# Patient Record
Sex: Female | Born: 1975 | Race: White | Hispanic: No | Smoking: Current every day smoker
Health system: Southern US, Community
[De-identification: ages and names within clinical notes are randomized; demographics above are authoritative.]

## PROBLEM LIST (undated history)

## (undated) DIAGNOSIS — I82409 Acute embolism and thrombosis of unspecified deep veins of unspecified lower extremity: Secondary | ICD-10-CM

## (undated) DIAGNOSIS — D649 Anemia, unspecified: Secondary | ICD-10-CM

## (undated) DIAGNOSIS — G629 Polyneuropathy, unspecified: Secondary | ICD-10-CM

## (undated) DIAGNOSIS — I10 Essential (primary) hypertension: Secondary | ICD-10-CM

## (undated) DIAGNOSIS — D751 Secondary polycythemia: Secondary | ICD-10-CM

## (undated) DIAGNOSIS — I639 Cerebral infarction, unspecified: Secondary | ICD-10-CM

## (undated) DIAGNOSIS — K219 Gastro-esophageal reflux disease without esophagitis: Secondary | ICD-10-CM

## (undated) HISTORY — PX: NO PAST SURGERIES: SHX2092

## (undated) HISTORY — DX: Gastro-esophageal reflux disease without esophagitis: K21.9

## (undated) HISTORY — DX: Cerebral infarction, unspecified: I63.9

## (undated) HISTORY — DX: Polyneuropathy, unspecified: G62.9

## (undated) HISTORY — DX: Acute embolism and thrombosis of unspecified deep veins of unspecified lower extremity: I82.409

## (undated) HISTORY — DX: Anemia, unspecified: D64.9

## (undated) HISTORY — DX: Secondary polycythemia: D75.1

## (undated) HISTORY — DX: Essential (primary) hypertension: I10

---

## 2003-12-09 ENCOUNTER — Other Ambulatory Visit: Payer: Self-pay

## 2004-11-23 ENCOUNTER — Emergency Department: Payer: Self-pay | Admitting: Emergency Medicine

## 2005-02-01 ENCOUNTER — Emergency Department: Payer: Self-pay | Admitting: Internal Medicine

## 2006-12-06 ENCOUNTER — Emergency Department: Payer: Self-pay

## 2008-07-04 ENCOUNTER — Emergency Department: Payer: Self-pay | Admitting: Emergency Medicine

## 2009-02-06 ENCOUNTER — Emergency Department: Payer: Self-pay | Admitting: Emergency Medicine

## 2009-05-01 ENCOUNTER — Emergency Department: Payer: Self-pay | Admitting: Emergency Medicine

## 2009-08-31 ENCOUNTER — Emergency Department: Payer: Self-pay | Admitting: Emergency Medicine

## 2012-07-09 ENCOUNTER — Emergency Department: Payer: Self-pay | Admitting: Unknown Physician Specialty

## 2014-05-14 ENCOUNTER — Emergency Department: Payer: Self-pay | Admitting: Emergency Medicine

## 2017-10-01 ENCOUNTER — Emergency Department
Admission: EM | Admit: 2017-10-01 | Discharge: 2017-10-01 | Disposition: A | Discharge status: Home / Self Care | Payer: Self-pay | Attending: Emergency Medicine | Admitting: Emergency Medicine

## 2017-10-01 ENCOUNTER — Emergency Department: Payer: Self-pay

## 2017-10-01 ENCOUNTER — Other Ambulatory Visit: Payer: Self-pay

## 2017-10-01 DIAGNOSIS — Y999 Unspecified external cause status: Secondary | ICD-10-CM | POA: Insufficient documentation

## 2017-10-01 DIAGNOSIS — S8262XA Displaced fracture of lateral malleolus of left fibula, initial encounter for closed fracture: Secondary | ICD-10-CM | POA: Insufficient documentation

## 2017-10-01 DIAGNOSIS — Y9382 Activity, spectator at an event: Secondary | ICD-10-CM | POA: Insufficient documentation

## 2017-10-01 DIAGNOSIS — F17200 Nicotine dependence, unspecified, uncomplicated: Secondary | ICD-10-CM | POA: Insufficient documentation

## 2017-10-01 DIAGNOSIS — X509XXA Other and unspecified overexertion or strenuous movements or postures, initial encounter: Secondary | ICD-10-CM | POA: Insufficient documentation

## 2017-10-01 DIAGNOSIS — Y9289 Other specified places as the place of occurrence of the external cause: Secondary | ICD-10-CM | POA: Insufficient documentation

## 2017-10-01 MED ORDER — OXYCODONE-ACETAMINOPHEN 5-325 MG PO TABS
1.0000 | ORAL_TABLET | Freq: Once | ORAL | Status: AC
  Administered 2017-10-01: 1 via ORAL
  Filled 2017-10-01: qty 1

## 2017-10-01 MED ORDER — OXYCODONE-ACETAMINOPHEN 5-325 MG PO TABS: 1.0000 | ORAL_TABLET | Freq: Three times a day (TID) | ORAL | 0 refills | Status: AC | PRN

## 2017-10-01 NOTE — ED Triage Notes (Signed)
Pt states was walking, rolled ankle on drain beside sidewalk. Swelling and bruising noted to L ankle. Has been walking on it. Ice packs at home. Alert, oriented.

## 2017-10-01 NOTE — ED Provider Notes (Signed)
[facility] Emergency Department Provider Note  ____________________________________________  Time seen: Approximately 4:42 PM  I have reviewed the triage vital signs and the nursing notes.   HISTORY  Chief Complaint Ankle Pain    HPI Joanna Garcia is a 42 y.o. female presents to the emergency department with 10 out of 10 aching right ankle pain after patient reports that she rolled her ankle while at a country music concert last night.  Patient reports that she has been able to "barely bear weight" since the incident.  She denies numbness, tingling or changes in sensation of the lower extremities.  Patient did not fall and hit her head.  No alleviating measures have been attempted.   History reviewed. No pertinent past medical history.  There are no active problems to display for this patient.   History reviewed. No pertinent surgical history.  Prior to Admission medications   Medication Sig Start Date End Date Taking? Authorizing Provider  oxyCODONE-acetaminophen (PERCOCET/ROXICET) 5-325 MG tablet Take 1 tablet by mouth every 8 (eight) hours as needed for up to 5 days for severe pain. 10/01/17 10/06/17  Lannie Fields, PA-C    Allergies Patient has no known allergies.  History reviewed. No pertinent family history.  Social History Social History   Tobacco Use  . Smoking status: Current Every Day Smoker  Substance Use Topics  . Alcohol use: Yes  . Drug use: Not on file     Review of Systems  Constitutional: No fever/chills Eyes: No visual changes. No discharge ENT: No upper respiratory complaints. Cardiovascular: no chest pain. Respiratory: no cough. No SOB. Gastrointestinal: No abdominal pain.  No nausea, no vomiting.  No diarrhea.  No constipation. Musculoskeletal: Patient has left ankle pain. Skin: Negative for rash, abrasions, lacerations, ecchymosis. Neurological: Negative for headaches, focal weakness or  numbness.   ____________________________________________   PHYSICAL EXAM:  VITAL SIGNS: ED Triage Vitals  Enc Vitals Group     BP 10/01/17 1527 (!) 168/103     Pulse Rate 10/01/17 1527 (!) 123     Resp 10/01/17 1527 18     Temp 10/01/17 1527 98 F (36.7 C)     Temp Source 10/01/17 1527 Oral     SpO2 10/01/17 1527 97 %     Weight 10/01/17 1527 143 lb (64.9 kg)     Height 10/01/17 1527 5\' 3"  (1.6 m)     Head Circumference --      Peak Flow --      Pain Score 10/01/17 1530 8     Pain Loc --      Pain Edu? --      Excl. in [place]? --      Constitutional: Alert and oriented. Well appearing and in no acute distress. Eyes: Conjunctivae are normal. PERRL. EOMI. Head: Atraumatic.  Cardiovascular: Normal rate, regular rhythm. Normal S1 and S2.  Good peripheral circulation. Respiratory: Normal respiratory effort without tachypnea or retractions. Lungs CTAB. Good air entry to the bases with no decreased or absent breath sounds. Musculoskeletal: Patient performs limited range of motion at the left ankle.  Patient has tenderness elicited over the lateral malleolus.  Patient is able to move all 5 left toes.  Palpable dorsalis pedis pulse, left. Neurologic:  Normal speech and language. No gross focal neurologic deficits are appreciated.  Skin:  Skin is warm, dry and intact. No rash noted. Psychiatric: Mood and affect are normal. Speech and behavior are normal. Patient exhibits appropriate insight and judgement.  ____________________________________________   LABS (all labs ordered are listed, but only abnormal results are displayed)  Labs Reviewed - No data to display ____________________________________________  EKG   ____________________________________________  RADIOLOGY Unk Pinto, personally viewed and evaluated these images (plain radiographs) as part of my medical decision making, as well as reviewing the written report by the radiologist.  Dg Ankle Complete  Left  Result Date: 10/01/2017 CLINICAL DATA:  Pain following rolling injury EXAM: LEFT ANKLE COMPLETE - 3+ VIEW COMPARISON:  None. FINDINGS: Frontal, oblique, and lateral views were obtained. There is generalized soft tissue swelling laterally. There is a transversely oriented fracture of the lateral malleolus with alignment essentially anatomic. No other fracture evident. No joint effusion. No appreciable joint space narrowing or erosion. Ankle mortise appears intact. IMPRESSION: Transverse fracture lateral malleolus with alignment essentially anatomic. Marked soft tissue swelling laterally. Ankle mortise appears intact. No appreciable arthropathy. No joint effusion evident. Electronically Signed   By: Lowella Grip III M.D.   On: 10/01/2017 16:04    ____________________________________________    PROCEDURES  Procedure(s) performed:    Procedures    Medications  oxyCODONE-acetaminophen (PERCOCET/ROXICET) 5-325 MG per tablet 1 tablet (1 tablet Oral Given 10/01/17 1642)     ____________________________________________   INITIAL IMPRESSION / ASSESSMENT AND PLAN / ED COURSE  Pertinent labs & imaging results that were available during my care of the patient were reviewed by me and considered in my medical decision making (see chart for details).  Review of the [place] CSRS was performed in accordance of the [place] prior to dispensing any controlled drugs.     Assessment and plan Lateral malleolus fracture Patient presents to the emergency department with left lateral ankle pain after patient reports that she rolled her ankle at a concert last night.  Differential diagnosis included sprain versus fracture.  X-ray examination reveals a lateral malleolus fracture.  Patient was splinted in the emergency department and given Roxicet for pain.  She was discharged with Percocet.  Patient was referred to orthopedics.  All patient questions were  answered.     ____________________________________________  FINAL CLINICAL IMPRESSION(S) / ED DIAGNOSES  Final diagnoses:  Closed fracture of distal lateral malleolus of left fibula, initial encounter      NEW MEDICATIONS STARTED DURING THIS VISIT:  ED Discharge Orders        Ordered    oxyCODONE-acetaminophen (PERCOCET/ROXICET) 5-325 MG tablet  Every 8 hours PRN     10/01/17 1618          This chart was dictated using voice recognition software/Dragon. Despite best efforts to proofread, errors can occur which can change the meaning. Any change was purely unintentional.    Lannie Fields, PA-C 10/01/17 1835    Nena Polio, MD 10/01/17 [phone]

## 2019-01-31 ENCOUNTER — Ambulatory Visit
Admission: EM | Admit: 2019-01-31 | Discharge: 2019-01-31 | Disposition: A | Discharge status: Other Acute Inpatient Hospital | Payer: Self-pay

## 2019-01-31 ENCOUNTER — Emergency Department: Payer: Self-pay

## 2019-01-31 ENCOUNTER — Emergency Department
Admission: EM | Admit: 2019-01-31 | Discharge: 2019-01-31 | Disposition: A | Discharge status: Home / Self Care | Payer: Self-pay | Attending: Emergency Medicine | Admitting: Emergency Medicine

## 2019-01-31 ENCOUNTER — Encounter: Payer: Self-pay | Admitting: Emergency Medicine

## 2019-01-31 ENCOUNTER — Other Ambulatory Visit: Payer: Self-pay

## 2019-01-31 DIAGNOSIS — F17210 Nicotine dependence, cigarettes, uncomplicated: Secondary | ICD-10-CM

## 2019-01-31 DIAGNOSIS — R202 Paresthesia of skin: Secondary | ICD-10-CM

## 2019-01-31 DIAGNOSIS — M7989 Other specified soft tissue disorders: Secondary | ICD-10-CM

## 2019-01-31 DIAGNOSIS — G629 Polyneuropathy, unspecified: Secondary | ICD-10-CM | POA: Insufficient documentation

## 2019-01-31 DIAGNOSIS — G6289 Other specified polyneuropathies: Secondary | ICD-10-CM

## 2019-01-31 DIAGNOSIS — I808 Phlebitis and thrombophlebitis of other sites: Secondary | ICD-10-CM | POA: Insufficient documentation

## 2019-01-31 DIAGNOSIS — I809 Phlebitis and thrombophlebitis of unspecified site: Secondary | ICD-10-CM | POA: Insufficient documentation

## 2019-01-31 LAB — CBC WITH DIFFERENTIAL/PLATELET
Abs Immature Granulocytes: 0.06 10*3/uL (ref 0.00–0.07)
Basophils Absolute: 0.1 10*3/uL (ref 0.0–0.1)
Basophils Relative: 1 %
Eosinophils Absolute: 0.3 10*3/uL (ref 0.0–0.5)
Eosinophils Relative: 2 %
HCT: 30 % — ABNORMAL LOW (ref 36.0–46.0)
Hemoglobin: 10 g/dL — ABNORMAL LOW (ref 12.0–15.0)
Immature Granulocytes: 0 %
Lymphocytes Relative: 12 %
Lymphs Abs: 1.6 10*3/uL (ref 0.7–4.0)
MCH: 42.4 pg — ABNORMAL HIGH (ref 26.0–34.0)
MCHC: 33.3 g/dL (ref 30.0–36.0)
MCV: 127.1 fL — ABNORMAL HIGH (ref 80.0–100.0)
Monocytes Absolute: 0.2 10*3/uL (ref 0.1–1.0)
Monocytes Relative: 1 %
Neutro Abs: 11.2 10*3/uL — ABNORMAL HIGH (ref 1.7–7.7)
Neutrophils Relative %: 84 %
Platelets: 628 10*3/uL — ABNORMAL HIGH (ref 150–400)
RBC: 2.36 MIL/uL — ABNORMAL LOW (ref 3.87–5.11)
RDW: 17 % — ABNORMAL HIGH (ref 11.5–15.5)
WBC: 13.5 10*3/uL — ABNORMAL HIGH (ref 4.0–10.5)
nRBC: 0 % (ref 0.0–0.2)

## 2019-01-31 LAB — COMPREHENSIVE METABOLIC PANEL
ALT: 92 U/L — ABNORMAL HIGH (ref 0–44)
AST: 108 U/L — ABNORMAL HIGH (ref 15–41)
Albumin: 4 g/dL (ref 3.5–5.0)
Alkaline Phosphatase: 80 U/L (ref 38–126)
Anion gap: 11 (ref 5–15)
BUN: 10 mg/dL (ref 6–20)
CO2: 22 mmol/L (ref 22–32)
Calcium: 9 mg/dL (ref 8.9–10.3)
Chloride: 102 mmol/L (ref 98–111)
Creatinine, Ser: 0.64 mg/dL (ref 0.44–1.00)
GFR calc Af Amer: >60 mL/min (ref >60)
GFR calc non Af Amer: >60 mL/min (ref >60)
Glucose, Bld: 134 mg/dL — ABNORMAL HIGH (ref 70–99)
Potassium: 3.7 mmol/L (ref 3.5–5.1)
Sodium: 135 mmol/L (ref 135–145)
Total Bilirubin: 0.8 mg/dL (ref 0.3–1.2)
Total Protein: 6.7 g/dL (ref 6.5–8.1)

## 2019-01-31 MED ORDER — DOXYCYCLINE HYCLATE 100 MG PO CAPS: 100.0000 mg | ORAL_CAPSULE | Freq: Two times a day (BID) | ORAL | 0 refills | Status: AC

## 2019-01-31 MED ORDER — NAPROXEN 500 MG PO TABS: 500.0000 mg | ORAL_TABLET | Freq: Two times a day (BID) | ORAL | 0 refills | Status: DC

## 2019-01-31 MED ORDER — DOXYCYCLINE HYCLATE 100 MG PO TABS
100.0000 mg | ORAL_TABLET | Freq: Once | ORAL | Status: AC
  Administered 2019-01-31: 23:00:00 100 mg via ORAL
  Filled 2019-01-31: qty 1

## 2019-01-31 MED ORDER — GABAPENTIN 300 MG PO CAPS: 300.0000 mg | ORAL_CAPSULE | Freq: Three times a day (TID) | ORAL | 0 refills | Status: DC

## 2019-01-31 MED ORDER — OXYCODONE-ACETAMINOPHEN 5-325 MG PO TABS
1.0000 | ORAL_TABLET | Freq: Once | ORAL | Status: AC
  Administered 2019-01-31: 1 via ORAL
  Filled 2019-01-31: qty 1

## 2019-01-31 NOTE — ED Notes (Signed)
Left arm forearm hot to touch, sore red and swollen. Seen at [facility] and sent here for evaluation

## 2019-01-31 NOTE — ED Provider Notes (Signed)
[facility] ____________________________________________  Time seen: Approximately 6:19 PM  I have reviewed the triage vital signs and the nursing notes.   HISTORY  Chief Complaint Numbness   HPI Joanna Garcia is a 43 y.o. female presenting for evaluation of bilateral hands and feet tingling, numbness and pain present for the last 4 weeks.  Patient reports this is been ongoing, but in the last few days she has had increased tingling and discomfort to her left hand.  Also states this weekend she noticed some pain to her left forearm and then the last 2 days she noticed redness and swelling to the same area.  No injury or break in skin.  States hands feel weak, tight and painful.  Hands are worse than feet.  No previous history of this.  No repetitive movements.  No injury.  Long-term smoker.  No history of diabetes.  Tachycardic today, but states this is her normal.  No chest pain, shortness of breath, palpitations, fevers or recent sickness.  Denies aggravating or alleviating factors.   History reviewed. No pertinent past medical history.  There are no active problems to display for this patient.   Past Surgical History:  Procedure Laterality Date  . NO PAST SURGERIES       No current facility-administered medications for this encounter.   Current Outpatient Medications:  .  lisinopril (ZESTRIL) 10 MG tablet, Take by mouth., Disp: , Rfl:  .  omeprazole (PRILOSEC) 20 MG capsule, Take by mouth., Disp: , Rfl:   Allergies Patient has no known allergies.  Family History  Problem Relation Age of Onset  . Hypertension Mother   . Hyperlipidemia Mother   . Heart disease Mother   . COPD Mother   . Hypertension Father   . Hyperlipidemia Father   . Cancer Father        prostate    Social History Social History   Tobacco Use  . Smoking status: Current Every Day Smoker    Packs/day: 0.50  . Smokeless tobacco: Never Used  Substance Use Topics  . Alcohol use:  Yes  . Drug use: Not Currently    Review of Systems Constitutional: No fever ENT: No sore throat. Cardiovascular: Denies chest pain. Respiratory: Denies shortness of breath. Gastrointestinal: No abdominal pain.  Musculoskeletal: Intermittent back pain. Skin: Negative for rash. Neurological: Negative for headaches.  Paresthesias to hands and feet. ____________________________________________   PHYSICAL EXAM:  VITAL SIGNS: ED Triage Vitals  Enc Vitals Group     BP 01/31/19 1734 (!) 136/97     Pulse Rate 01/31/19 1734 (!) 122     Resp 01/31/19 1734 18     Temp 01/31/19 1734 98.7 F (37.1 C)     Temp Source 01/31/19 1734 Oral     SpO2 01/31/19 1734 95 %     Weight 01/31/19 1732 158 lb (71.7 kg)     Height 01/31/19 1732 5\' 4"  (1.626 m)     Head Circumference --      Peak Flow --      Pain Score 01/31/19 1731 8     Pain Loc --      Pain Edu? --      Excl. in [place]? --     Constitutional: Alert and oriented. Well appearing and in no acute distress. Eyes: Conjunctivae are normal.  ENT      Head: Normocephalic and atraumatic. Hematological/Lymphatic/Immunilogical: No cervical lymphadenopathy. Cardiovascular: Tachycardic good peripheral circulation. Respiratory: Normal respiratory effort without tachypnea nor retractions. Breath  sounds are clear and equal bilaterally. No wheezes, rales, rhonchi. Musculoskeletal: Bilateral distal radial and pedal pulses equal and easily palpated.  Left hand grip weaker than right.  Extremities normal distal capillary refill without discoloration.  Left proximal forearm area of erythema with warmth and induration with tenderness and edema, no break in skin noted. Neurologic:  Normal speech and language.  Skin:  Skin is warm, dry Psychiatric: Mood and affect are normal. Speech and behavior are normal. Patient exhibits appropriate insight and judgment   ___________________________________________   LABS (all labs ordered are listed, but only  abnormal results are displayed)  Labs Reviewed - No data to display   PROCEDURES Procedures  ______________________________________   INITIAL IMPRESSION / ASSESSMENT AND PLAN / ED COURSE  Pertinent labs & imaging results that were available during my care of the patient were reviewed by me and considered in my medical decision making (see chart for details).  Overall well-appearing patient.  No acute distress.  Discussed multiple differentials with patient including peripheral neuropathy, electrolyte abnormalities, anemia and DVT.  Counseled smoking cessation.  Due to concern of left forearm swelling and erythema recommend further evaluation in the ER for exclusion of DVT at this time.  Patient agrees to this plan.  Seth Bake RN triage nurse aliments called and given report.  Patient stable at discharge. ____________________________________________   FINAL CLINICAL IMPRESSION(S) / ED DIAGNOSES  Final diagnoses:  Paresthesia of both hands  Paresthesia of both feet  Left arm swelling     ED Discharge Orders    None       Note: This dictation was prepared with Dragon dictation along with smaller phrase technology. Any transcriptional errors that result from this process are unintentional.         Marylene Land, NP 01/31/19 1927

## 2019-01-31 NOTE — ED Triage Notes (Signed)
Pt c/o numbness and sharp pains in bilateral hands and feet. Started about 4 weeks ago.

## 2019-01-31 NOTE — ED Notes (Signed)
Pt to ultrasound

## 2019-01-31 NOTE — ED Provider Notes (Signed)
[facility] Emergency Department Provider Note   ____________________________________________   First MD Initiated Contact with Patient 01/31/19 2220     (approximate)  I have reviewed the triage vital signs and the nursing notes.   HISTORY  Chief Complaint Numbness    HPI Joanna Garcia is a 41 y.o. female 43 year old female with past medical history of hypertension and GERD presents to the ED complaining of arm pain and swelling.  Patient reports that she has had increasing pain and swelling in her left forearm for approximately the past week.  She states it is very tender to touch and there seems to be spreading redness in the area.  She denies any trauma to this area and has not had any injuries to her skin.  She was initially evaluated at an urgent care and referred to the ED for further evaluation of possible clot.  She denies any history of DVT/PE, has not had any recent surgery or chemotherapy.  She denies any pain or swelling in her other extremities.  She does complain of painful numbness and tingling in all 4 extremities, but denies any weakness.        History reviewed. No pertinent past medical history.  There are no active problems to display for this patient.   Past Surgical History:  Procedure Laterality Date  . NO PAST SURGERIES      Prior to Admission medications   Medication Sig Start Date End Date Taking? Authorizing Provider  doxycycline (VIBRAMYCIN) 100 MG capsule Take 1 capsule (100 mg total) by mouth 2 (two) times daily for 7 days. 01/31/19 02/07/19  Blake Divine, MD  gabapentin (NEURONTIN) 300 MG capsule Take 1 capsule (300 mg total) by mouth 3 (three) times daily. 01/31/19 03/02/19  Blake Divine, MD  lisinopril (ZESTRIL) 10 MG tablet Take by mouth.    [provider]  naproxen (NAPROSYN) 500 MG tablet Take 1 tablet (500 mg total) by mouth 2 (two) times daily with a meal. 01/31/19 01/31/20  Blake Divine, MD   omeprazole (PRILOSEC) 20 MG capsule Take by mouth.    [provider]    Allergies Patient has no known allergies.  Family History  Problem Relation Age of Onset  . Hypertension Mother   . Hyperlipidemia Mother   . Heart disease Mother   . COPD Mother   . Hypertension Father   . Hyperlipidemia Father   . Cancer Father        prostate    Social History Social History   Tobacco Use  . Smoking status: Current Every Day Smoker    Packs/day: 0.50  . Smokeless tobacco: Never Used  Substance Use Topics  . Alcohol use: Yes  . Drug use: Not Currently    Review of Systems  Constitutional: No fever/chills Eyes: No visual changes. ENT: No sore throat. Cardiovascular: Denies chest pain. Respiratory: Denies shortness of breath. Gastrointestinal: No abdominal pain.  No nausea, no vomiting.  No diarrhea.  No constipation. Genitourinary: Negative for dysuria. Musculoskeletal: Negative for back pain.  Positive for left upper extremity pain and swelling. Skin: Negative for rash. Neurological: Negative for headaches, focal weakness.  Positive for numbness and tingling.  ____________________________________________   PHYSICAL EXAM:  VITAL SIGNS: ED Triage Vitals  Enc Vitals Group     BP 01/31/19 1852 (!) 155/91     Pulse Rate 01/31/19 1852 (!) 128     Resp 01/31/19 1852 18     Temp 01/31/19 1852 99 F (37.2  C)     Temp Source 01/31/19 2223 Oral     SpO2 01/31/19 1852 99 %     Weight --      Height --      Head Circumference --      Peak Flow --      Pain Score 01/31/19 2223 8     Pain Loc --      Pain Edu? --      Excl. in [place]? --     Constitutional: Alert and oriented. Eyes: Conjunctivae are normal. Head: Atraumatic. Nose: No congestion/rhinnorhea. Mouth/Throat: Mucous membranes are moist. Neck: Normal ROM Cardiovascular: Normal rate, regular rhythm. Grossly normal heart sounds.  2+ radial pulses bilaterally. Respiratory: Normal respiratory effort.  No  retractions. Lungs CTAB. Gastrointestinal: Soft and nontender. No distention. Genitourinary: deferred Musculoskeletal: No lower extremity tenderness or edema.  Area of erythema, warmth, and tenderness to medial portion of left forearm with no associated fluctuance, does have palpable cord along this area. Neurologic:  Normal speech and language. No gross focal neurologic deficits are appreciated. Skin:  Skin is warm, dry and intact. No rash noted. Psychiatric: Mood and affect are normal. Speech and behavior are normal.  ____________________________________________   LABS (all labs ordered are listed, but only abnormal results are displayed)  Labs Reviewed  CBC WITH DIFFERENTIAL/PLATELET - Abnormal; Notable for the following components:      Result Value   WBC 13.5 (*)    RBC 2.36 (*)    Hemoglobin 10.0 (*)    HCT 30.0 (*)    MCV 127.1 (*)    MCH 42.4 (*)    RDW 17.0 (*)    Platelets 628 (*)    Neutro Abs 11.2 (*)    All other components within normal limits  COMPREHENSIVE METABOLIC PANEL - Abnormal; Notable for the following components:   Glucose, Bld 134 (*)    AST 108 (*)    ALT 92 (*)    All other components within normal limits     PROCEDURES  Procedure(s) performed (including Critical Care):  Procedures   ____________________________________________   INITIAL IMPRESSION / ASSESSMENT AND PLAN / ED COURSE       43 year old female presenting with increasing pain, swelling, and redness to left forearm over the past week along with diffuse extremity numbness and tingling.  Area to left forearm does appear infected as it is erythematous, warm, and very tender to touch.  Ultrasound shows no evidence of DVT, instead shows a superficial thrombus.  Do not feel patient would benefit from anticoagulation for this, but there is concern for associated infection, will treat with doxycycline.  Will treat with NSAIDs for now and counseled patient on compresses.  Will have patient  follow-up with vascular surgery.  Additionally, she will need to follow-up with her PCP regarding her CBC results that show a macrocytic anemia and elevated platelets.  No clear etiology for patient's painful paresthesias, will initiate treatment with gabapentin.  Counseled patient to return to the ED for new or worsening symptoms, patient agrees with plan.      ____________________________________________   FINAL CLINICAL IMPRESSION(S) / ED DIAGNOSES  Final diagnoses:  Superficial thrombophlebitis of left upper extremity  Septic thrombophlebitis  Other polyneuropathy     ED Discharge Orders         Ordered    doxycycline (VIBRAMYCIN) 100 MG capsule  2 times daily     01/31/19 2259    naproxen (NAPROSYN) 500 MG tablet  2 times daily  with meals     01/31/19 2259    gabapentin (NEURONTIN) 300 MG capsule  3 times daily     01/31/19 2259           Note:  This document was prepared using Dragon voice recognition software and may include unintentional dictation errors.   Blake Divine, MD 01/31/19 [phone]

## 2019-01-31 NOTE — ED Triage Notes (Signed)
Pt to ER from UC with c/o numbness and tingling in bilateral feet and hands for last four weeks.  States while she was at [facility] she showed them a spot on her left arm that they are concerned is a blood clot.  She states this spot started over the weekend.  Pt reports pain and swelling to arm.

## 2019-01-31 NOTE — Discharge Instructions (Addendum)
Go directly to emergency room as discussed.  °

## 2019-02-12 ENCOUNTER — Encounter (INDEPENDENT_AMBULATORY_CARE_PROVIDER_SITE_OTHER): Payer: Self-pay | Admitting: Vascular Surgery

## 2019-02-12 ENCOUNTER — Ambulatory Visit (INDEPENDENT_AMBULATORY_CARE_PROVIDER_SITE_OTHER): Payer: Self-pay | Admitting: Vascular Surgery

## 2019-02-12 ENCOUNTER — Other Ambulatory Visit: Payer: Self-pay

## 2019-02-12 DIAGNOSIS — K219 Gastro-esophageal reflux disease without esophagitis: Secondary | ICD-10-CM | POA: Insufficient documentation

## 2019-02-12 DIAGNOSIS — I809 Phlebitis and thrombophlebitis of unspecified site: Secondary | ICD-10-CM | POA: Insufficient documentation

## 2019-02-12 DIAGNOSIS — D473 Essential (hemorrhagic) thrombocythemia: Secondary | ICD-10-CM

## 2019-02-12 DIAGNOSIS — D75839 Thrombocytosis, unspecified: Secondary | ICD-10-CM | POA: Insufficient documentation

## 2019-02-12 DIAGNOSIS — I808 Phlebitis and thrombophlebitis of other sites: Secondary | ICD-10-CM

## 2019-02-12 DIAGNOSIS — I10 Essential (primary) hypertension: Secondary | ICD-10-CM | POA: Insufficient documentation

## 2019-02-12 NOTE — Progress Notes (Signed)
MRN : [id number]  Joanna Garcia is a 43 y.o. (07-10-1975) female who presents with chief complaint of  Chief Complaint  Patient presents with   Follow-up  .  History of Present Illness:   The patient presents to the office for evaluation of STP.  STP was identified at [facility] by Duplex ultrasound.  The initial symptoms were pain and swelling in the arm.  The patient notes the continues to be painful but is improving.  Symptoms are much better with elevation.    The patient has not been using compression therapy at this point.  No SOB or pleuritic chest pains.  No cough or hemoptysis.    Current Meds  Medication Sig   gabapentin (NEURONTIN) 300 MG capsule Take 1 capsule (300 mg total) by mouth 3 (three) times daily.   lisinopril (ZESTRIL) 10 MG tablet Take by mouth.   omeprazole (PRILOSEC) 20 MG capsule Take by mouth.    Past Medical History:  Diagnosis Date   DVT (deep venous thrombosis) (HCC)    GERD (gastroesophageal reflux disease)    Hypertension    Polyneuropathy     Past Surgical History:  Procedure Laterality Date   NO PAST SURGERIES      Social History Social History   Tobacco Use   Smoking status: Current Every Day Smoker    Packs/day: 0.50   Smokeless tobacco: Never Used  Substance Use Topics   Alcohol use: Yes   Drug use: Not Currently    Family History Family History  Problem Relation Age of Onset   Hypertension Mother    Hyperlipidemia Mother    Heart disease Mother    COPD Mother    Hypertension Father    Hyperlipidemia Father    Cancer Father        prostate  No family history of bleeding/clotting disorders, porphyria or autoimmune disease   No Known Allergies   REVIEW OF SYSTEMS (Negative unless checked)  Constitutional: [] Weight loss  [] Fever  [] Chills Cardiac: [] Chest pain   [] Chest pressure   [] Palpitations   [] Shortness of breath when laying flat   [] Shortness of breath with exertion. Vascular:  [] Pain  in legs with walking   [] Pain in legs at rest  [] History of DVT   [] Phlebitis   [] Swelling in legs   [] Varicose veins   [] Non-healing ulcers Pulmonary:   [] Uses home oxygen   [] Productive cough   [] Hemoptysis   [] Wheeze  [x] COPD   [] Asthma Neurologic:  [] Dizziness   [] Seizures   [] History of stroke   [] History of TIA  [] Aphasia   [] Vissual changes   [] Weakness or numbness in arm   [] Weakness or numbness in leg Musculoskeletal:   [] Joint swelling   [] Joint pain   [] Low back pain Hematologic:  [] Easy bruising  [] Easy bleeding   [x] Hypercoagulable state   [] Anemic Gastrointestinal:  [] Diarrhea   [] Vomiting  [] Gastroesophageal reflux/heartburn   [] Difficulty swallowing. Genitourinary:  [] Chronic kidney disease   [] Difficult urination  [] Frequent urination   [] Blood in urine Skin:  [] Rashes   [] Ulcers  Psychological:  [] History of anxiety   []  History of major depression.  Physical Examination  Vitals:   02/12/19 1023  BP: 133/82  Pulse: (!) 121  Resp: 12  Weight: 163 lb (73.9 kg)  Height: 5\' 4"  (1.626 m)   Body mass index is 27.98 kg/m. Gen: WD/WN, NAD Head: [place]/AT, No temporalis wasting.  Ear/Nose/Throat: Hearing grossly intact, nares w/o erythema or drainage, poor dentition Eyes: PER, EOMI,  sclera nonicteric.  Neck: Supple, no masses.  No bruit or JVD.  Pulmonary:  Good air movement, clear to auscultation bilaterally, no use of accessory muscles.  Cardiac: RRR, normal S1, S2, no Murmurs. Vascular: stp left arm subacute Vessel Right Left  Radial Palpable Palpable  Brachial Palpable Palpable  Gastrointestinal: soft, non-distended. No guarding/no peritoneal signs.  Musculoskeletal: M/S 5/5 throughout.  No deformity or atrophy.  Neurologic: CN 2-12 intact. Pain and light touch intact in extremities.  Symmetrical.  Speech is fluent. Motor exam as listed above. Psychiatric: Judgment intact, Mood & affect appropriate for pt's clinical situation. Dermatologic: No rashes or ulcers noted.  No  changes consistent with cellulitis. Lymph : No Cervical lymphadenopathy, no lichenification or skin changes of chronic lymphedema.  CBC Lab Results  Component Value Date   WBC 13.5 (H) 01/31/2019   HGB 10.0 (L) 01/31/2019   HCT 30.0 (L) 01/31/2019   MCV 127.1 (H) 01/31/2019   PLT 628 (H) 01/31/2019    BMET    Component Value Date/Time   NA 135 01/31/2019 1853   K 3.7 01/31/2019 1853   CL 102 01/31/2019 1853   CO2 22 01/31/2019 1853   GLUCOSE 134 (H) 01/31/2019 1853   BUN 10 01/31/2019 1853   CREATININE 0.64 01/31/2019 1853   CALCIUM 9.0 01/31/2019 1853   GFRNONAA >60 01/31/2019 1853   GFRAA >60 01/31/2019 1853   Estimated Creatinine Clearance: 91.2 mL/min (by C-G formula based on SCr of 0.64 mg/dL).  COAG No results found for: INR, PROTIME  Radiology US Venous Img Upper Uni Left  Result Date: 01/31/2019 CLINICAL DATA:  43 year old female with left upper extremity pain and edema. EXAM: Left UPPER EXTREMITY VENOUS DOPPLER ULTRASOUND TECHNIQUE: Gray-scale sonography with graded compression, as well as color Doppler and duplex ultrasound were performed to evaluate the upper extremity deep venous system from the level of the subclavian vein and including the jugular, axillary, basilic, radial, ulnar and upper cephalic vein. Spectral Doppler was utilized to evaluate flow at rest and with distal augmentation maneuvers. COMPARISON:  None. FINDINGS: Contralateral Subclavian Vein: Respiratory phasicity is normal and symmetric with the symptomatic side. No evidence of thrombus. Normal compressibility. Internal Jugular Vein: No evidence of thrombus. Normal compressibility, respiratory phasicity and response to augmentation. Subclavian Vein: No evidence of thrombus. Normal compressibility, respiratory phasicity and response to augmentation. Axillary Vein: No evidence of thrombus. Normal compressibility, respiratory phasicity and response to augmentation. Cephalic Vein: No evidence of thrombus.  Normal compressibility, respiratory phasicity and response to augmentation. Basilic Vein: There is a focal area of occlusive thrombus in the left basilic vein in the forearm corresponding to the area of redness. Brachial Veins: No evidence of thrombus. Normal compressibility, respiratory phasicity and response to augmentation. Radial Veins: No evidence of thrombus. Normal compressibility, respiratory phasicity and response to augmentation. Ulnar Veins: No evidence of thrombus. Normal compressibility, respiratory phasicity and response to augmentation. Venous Reflux:  None visualized. Other Findings:  None visualized. IMPRESSION: 1. No evidence of DVT within the left upper extremity. 2. Segmental occlusive thrombus in the basilic vein at the forearm corresponds to the area of redness. No fluid collection. Electronically Signed   By: Anner Crete M.D.   On: 01/31/2019 20:36     Assessment/Plan 1. Superficial thrombophlebitis of left upper extremity Continue conservative therapy   A total of 45 minutes was spent with this patient and greater than 50% was spent in counseling and coordination of care with the patient.  Discussion included the treatment options  for vascular disease including indications for surgery and intervention.  Also discussed is the appropriate timing of treatment.  In addition medical therapy was discussed.  2. Thrombocytosis ([place]) Continue taking ASA daily  3. Gastroesophageal reflux disease, esophagitis presence not specified Continue PPI as already ordered, this medication has been reviewed and there are no changes at this time.  Avoidence of caffeine and alcohol  Moderate elevation of the head of the bed   4. Essential hypertension Continue antihypertensive medications as already ordered, these medications have been reviewed and there are no changes at this time.     Hortencia Pilar, MD  02/12/2019 11:31 AM

## 2019-03-14 ENCOUNTER — Inpatient Hospital Stay: Payer: Self-pay

## 2019-03-14 ENCOUNTER — Other Ambulatory Visit: Payer: Self-pay

## 2019-03-14 ENCOUNTER — Emergency Department: Payer: Self-pay

## 2019-03-14 ENCOUNTER — Inpatient Hospital Stay
Admission: EM | Admit: 2019-03-14 | Discharge: 2019-03-16 | DRG: 872 | Disposition: A | Discharge status: Home / Self Care | Payer: Self-pay | Attending: Internal Medicine | Admitting: Internal Medicine

## 2019-03-14 DIAGNOSIS — K449 Diaphragmatic hernia without obstruction or gangrene: Secondary | ICD-10-CM | POA: Diagnosis present

## 2019-03-14 DIAGNOSIS — Z86718 Personal history of other venous thrombosis and embolism: Secondary | ICD-10-CM

## 2019-03-14 DIAGNOSIS — I10 Essential (primary) hypertension: Secondary | ICD-10-CM | POA: Diagnosis present

## 2019-03-14 DIAGNOSIS — K640 First degree hemorrhoids: Secondary | ICD-10-CM | POA: Diagnosis present

## 2019-03-14 DIAGNOSIS — D500 Iron deficiency anemia secondary to blood loss (chronic): Secondary | ICD-10-CM | POA: Diagnosis present

## 2019-03-14 DIAGNOSIS — Z825 Family history of asthma and other chronic lower respiratory diseases: Secondary | ICD-10-CM

## 2019-03-14 DIAGNOSIS — J811 Chronic pulmonary edema: Secondary | ICD-10-CM | POA: Diagnosis present

## 2019-03-14 DIAGNOSIS — Z8349 Family history of other endocrine, nutritional and metabolic diseases: Secondary | ICD-10-CM

## 2019-03-14 DIAGNOSIS — K5730 Diverticulosis of large intestine without perforation or abscess without bleeding: Secondary | ICD-10-CM | POA: Diagnosis present

## 2019-03-14 DIAGNOSIS — F17210 Nicotine dependence, cigarettes, uncomplicated: Secondary | ICD-10-CM | POA: Diagnosis present

## 2019-03-14 DIAGNOSIS — Z20828 Contact with and (suspected) exposure to other viral communicable diseases: Secondary | ICD-10-CM | POA: Diagnosis present

## 2019-03-14 DIAGNOSIS — D539 Nutritional anemia, unspecified: Secondary | ICD-10-CM | POA: Diagnosis present

## 2019-03-14 DIAGNOSIS — Z8249 Family history of ischemic heart disease and other diseases of the circulatory system: Secondary | ICD-10-CM

## 2019-03-14 DIAGNOSIS — R0602 Shortness of breath: Secondary | ICD-10-CM

## 2019-03-14 DIAGNOSIS — D649 Anemia, unspecified: Secondary | ICD-10-CM

## 2019-03-14 DIAGNOSIS — K210 Gastro-esophageal reflux disease with esophagitis: Secondary | ICD-10-CM | POA: Diagnosis present

## 2019-03-14 DIAGNOSIS — F1020 Alcohol dependence, uncomplicated: Secondary | ICD-10-CM | POA: Diagnosis present

## 2019-03-14 DIAGNOSIS — K921 Melena: Secondary | ICD-10-CM | POA: Diagnosis present

## 2019-03-14 DIAGNOSIS — Z8042 Family history of malignant neoplasm of prostate: Secondary | ICD-10-CM

## 2019-03-14 DIAGNOSIS — N390 Urinary tract infection, site not specified: Secondary | ICD-10-CM | POA: Diagnosis present

## 2019-03-14 DIAGNOSIS — Z79899 Other long term (current) drug therapy: Secondary | ICD-10-CM

## 2019-03-14 DIAGNOSIS — K922 Gastrointestinal hemorrhage, unspecified: Secondary | ICD-10-CM

## 2019-03-14 DIAGNOSIS — G629 Polyneuropathy, unspecified: Secondary | ICD-10-CM | POA: Diagnosis present

## 2019-03-14 DIAGNOSIS — A4151 Sepsis due to Escherichia coli [E. coli]: Principal | ICD-10-CM | POA: Diagnosis present

## 2019-03-14 DIAGNOSIS — R1314 Dysphagia, pharyngoesophageal phase: Secondary | ICD-10-CM | POA: Diagnosis present

## 2019-03-14 LAB — HEPATIC FUNCTION PANEL
ALT: 51 U/L — ABNORMAL HIGH (ref 0–44)
AST: 67 U/L — ABNORMAL HIGH (ref 15–41)
Albumin: 3.3 g/dL — ABNORMAL LOW (ref 3.5–5.0)
Alkaline Phosphatase: 64 U/L (ref 38–126)
Bilirubin, Direct: 0.3 mg/dL — ABNORMAL HIGH (ref 0.0–0.2)
Indirect Bilirubin: 0.9 mg/dL (ref 0.3–0.9)
Total Bilirubin: 1.2 mg/dL (ref 0.3–1.2)
Total Protein: 5.9 g/dL — ABNORMAL LOW (ref 6.5–8.1)

## 2019-03-14 LAB — URINALYSIS, COMPLETE (UACMP) WITH MICROSCOPIC
Glucose, UA: NEGATIVE mg/dL
Hgb urine dipstick: NEGATIVE
Ketones, ur: NEGATIVE mg/dL
Nitrite: POSITIVE — AB
Protein, ur: 30 mg/dL — AB
Specific Gravity, Urine: 1.015 (ref 1.005–1.030)
WBC, UA: >50 WBC/hpf — ABNORMAL HIGH (ref 0–5)
pH: 7 (ref 5.0–8.0)

## 2019-03-14 LAB — CBC WITH DIFFERENTIAL/PLATELET
Abs Immature Granulocytes: 0.1 10*3/uL — ABNORMAL HIGH (ref 0.00–0.07)
Basophils Absolute: 0 10*3/uL (ref 0.0–0.1)
Basophils Relative: 0 %
Eosinophils Absolute: 0.2 10*3/uL (ref 0.0–0.5)
Eosinophils Relative: 2 %
HCT: 12.4 % — CL (ref 36.0–46.0)
Hemoglobin: 4 g/dL — CL (ref 12.0–15.0)
Immature Granulocytes: 1 %
Lymphocytes Relative: 12 %
Lymphs Abs: 1.4 10*3/uL (ref 0.7–4.0)
MCH: 40.4 pg — ABNORMAL HIGH (ref 26.0–34.0)
MCHC: 32.3 g/dL (ref 30.0–36.0)
MCV: 125.3 fL — ABNORMAL HIGH (ref 80.0–100.0)
Monocytes Absolute: 0.3 10*3/uL (ref 0.1–1.0)
Monocytes Relative: 3 %
Neutro Abs: 9.4 10*3/uL — ABNORMAL HIGH (ref 1.7–7.7)
Neutrophils Relative %: 82 %
Platelets: 381 10*3/uL (ref 150–400)
RBC: 0.99 MIL/uL — ABNORMAL LOW (ref 3.87–5.11)
RDW: 21 % — ABNORMAL HIGH (ref 11.5–15.5)
WBC: 11.4 10*3/uL — ABNORMAL HIGH (ref 4.0–10.5)
nRBC: 0.9 % — ABNORMAL HIGH (ref 0.0–0.2)

## 2019-03-14 LAB — BASIC METABOLIC PANEL
Anion gap: 7 (ref 5–15)
BUN: 8 mg/dL (ref 6–20)
CO2: 25 mmol/L (ref 22–32)
Calcium: 8.5 mg/dL — ABNORMAL LOW (ref 8.9–10.3)
Chloride: 103 mmol/L (ref 98–111)
Creatinine, Ser: 0.54 mg/dL (ref 0.44–1.00)
GFR calc Af Amer: >60 mL/min (ref >60)
GFR calc non Af Amer: >60 mL/min (ref >60)
Glucose, Bld: 113 mg/dL — ABNORMAL HIGH (ref 70–99)
Potassium: 4.3 mmol/L (ref 3.5–5.1)
Sodium: 135 mmol/L (ref 135–145)

## 2019-03-14 LAB — LACTIC ACID, PLASMA: Lactic Acid, Venous: 1 mmol/L (ref 0.5–1.9)

## 2019-03-14 LAB — ABO/RH: ABO/RH(D): B POS

## 2019-03-14 LAB — SARS CORONAVIRUS 2 BY RT PCR (HOSPITAL ORDER, PERFORMED IN ~~LOC~~ HOSPITAL LAB): SARS Coronavirus 2: NEGATIVE

## 2019-03-14 LAB — PATHOLOGIST SMEAR REVIEW

## 2019-03-14 LAB — HEMOGLOBIN: Hemoglobin: 5.8 g/dL — ABNORMAL LOW (ref 12.0–15.0)

## 2019-03-14 LAB — PREPARE RBC (CROSSMATCH)

## 2019-03-14 MED ORDER — PANTOPRAZOLE SODIUM 40 MG IV SOLR: 40.0000 mg | Freq: Two times a day (BID) | INTRAVENOUS | Status: DC

## 2019-03-14 MED ORDER — FUROSEMIDE 10 MG/ML IJ SOLN
20.0000 mg | Freq: Once | INTRAMUSCULAR | Status: AC
  Administered 2019-03-14: 20 mg via INTRAVENOUS
  Filled 2019-03-14: qty 4

## 2019-03-14 MED ORDER — SODIUM CHLORIDE 0.9 % IV SOLN
80.0000 mg | Freq: Once | INTRAVENOUS | Status: AC
  Administered 2019-03-14: 80 mg via INTRAVENOUS
  Filled 2019-03-14: qty 80

## 2019-03-14 MED ORDER — THIAMINE HCL 100 MG/ML IJ SOLN
100.0000 mg | Freq: Every day | INTRAMUSCULAR | Status: DC
  Administered 2019-03-14 – 2019-03-16 (×3): 100 mg via INTRAVENOUS
  Filled 2019-03-14 (×3): qty 2

## 2019-03-14 MED ORDER — SODIUM CHLORIDE 0.9% IV SOLUTION
Freq: Once | INTRAVENOUS | Status: AC
  Administered 2019-03-15: 02:00:00 via INTRAVENOUS

## 2019-03-14 MED ORDER — SODIUM CHLORIDE 0.9 % IV SOLN
10.0000 mL/h | Freq: Once | INTRAVENOUS | Status: AC
  Administered 2019-03-14: 10 mL/h via INTRAVENOUS

## 2019-03-14 MED ORDER — SODIUM CHLORIDE 0.9 % IV SOLN
INTRAVENOUS | Status: DC
  Administered 2019-03-14 – 2019-03-16 (×3): via INTRAVENOUS

## 2019-03-14 MED ORDER — DOCUSATE SODIUM 100 MG PO CAPS: 100.0000 mg | ORAL_CAPSULE | Freq: Two times a day (BID) | ORAL | Status: DC | PRN

## 2019-03-14 MED ORDER — TRAZODONE HCL 50 MG PO TABS
50.0000 mg | ORAL_TABLET | Freq: Every evening | ORAL | Status: DC | PRN
  Administered 2019-03-15: 50 mg via ORAL
  Filled 2019-03-14: qty 1

## 2019-03-14 MED ORDER — SODIUM CHLORIDE 0.9 % IV SOLN
1.0000 g | INTRAVENOUS | Status: DC
  Administered 2019-03-15: 1 g via INTRAVENOUS
  Filled 2019-03-14 (×2): qty 10
  Filled 2019-03-14: qty 1

## 2019-03-14 MED ORDER — SODIUM CHLORIDE 0.9 % IV SOLN
1.0000 g | Freq: Once | INTRAVENOUS | Status: AC
  Administered 2019-03-14: 1 g via INTRAVENOUS
  Filled 2019-03-14: qty 10

## 2019-03-14 MED ORDER — NICOTINE 21 MG/24HR TD PT24
21.0000 mg | MEDICATED_PATCH | Freq: Every day | TRANSDERMAL | Status: DC
  Administered 2019-03-14 – 2019-03-16 (×3): 21 mg via TRANSDERMAL
  Filled 2019-03-14 (×3): qty 1

## 2019-03-14 MED ORDER — SODIUM CHLORIDE 0.9% FLUSH
10.0000 mL | INTRAVENOUS | Status: DC | PRN
  Administered 2019-03-15: 10 mL
  Filled 2019-03-14: qty 40

## 2019-03-14 MED ORDER — VITAMIN B-1 100 MG PO TABS: 100.0000 mg | ORAL_TABLET | Freq: Every day | ORAL | Status: DC

## 2019-03-14 MED ORDER — VITAMIN C 500 MG PO TABS
500.0000 mg | ORAL_TABLET | Freq: Two times a day (BID) | ORAL | Status: DC
  Administered 2019-03-14 – 2019-03-16 (×4): 500 mg via ORAL
  Filled 2019-03-14 (×4): qty 1

## 2019-03-14 MED ORDER — SODIUM CHLORIDE 0.9% FLUSH
10.0000 mL | Freq: Two times a day (BID) | INTRAVENOUS | Status: DC
  Administered 2019-03-14 – 2019-03-15 (×3): 10 mL

## 2019-03-14 MED ORDER — FOLIC ACID 5 MG/ML IJ SOLN
1.0000 mg | Freq: Every day | INTRAMUSCULAR | Status: DC
  Administered 2019-03-14 – 2019-03-16 (×3): 1 mg via INTRAVENOUS
  Filled 2019-03-14 (×3): qty 0.2

## 2019-03-14 MED ORDER — SODIUM CHLORIDE 0.9 % IV SOLN
8.0000 mg/h | INTRAVENOUS | Status: DC
  Administered 2019-03-14 – 2019-03-16 (×4): 8 mg/h via INTRAVENOUS
  Filled 2019-03-14 (×4): qty 80

## 2019-03-14 MED ORDER — FOLIC ACID 1 MG PO TABS: 1.0000 mg | ORAL_TABLET | Freq: Every day | ORAL | Status: DC

## 2019-03-14 NOTE — ED Notes (Signed)
Date and time results received: 03/14/19 12:04pm (use smartphrase ".now" to insert current time)  Test: HGB Critical Value: 4.0  Name of Provider Notified: Goodman,MD  Orders Received? Or Actions Taken?: blood ordered

## 2019-03-14 NOTE — ED Notes (Signed)
Lab contacted to send phlebotomist for blood draw

## 2019-03-14 NOTE — ED Notes (Signed)
ED TO INPATIENT HANDOFF REPORT  ED Nurse Name and Phone #: Valetta Fuller [phone]  S Name/Age/Gender Joanna Garcia 43 y.o. female Room/Bed: ED07A/ED07A  Code Status   Code Status: Not on file  Home/SNF/Other Home Patient oriented to: self, place, time and situation Is this baseline? Yes   Triage Complete: Triage complete  Chief Complaint sob  Triage Note Pt arrived via EMS from home with chief c/o of [facility] and abnormal labs. Stated the sherriff was at her house to check on her because she had abnormal labs from the doctor's office and her family was unable to get in contact with her, pt unsure which labs were abnormal. Pt also c/o SHOB x2 weeks and blood clot in left arm 3 weeks ago. Pt sinus tach on monitor, NAD noted.    Allergies No Known Allergies  Level of Care/Admitting Diagnosis ED Disposition    ED Disposition Condition [facility] Area: [place] [[id number]]  Level of Care: Med-Surg [16]  Covid Evaluation: Confirmed COVID Negative  Diagnosis: Symptomatic anemia [id number]  Admitting Physician: Vaughan Basta [phone]  Attending Physician: Vaughan Basta [phone]  Estimated length of stay: past midnight tomorrow  Certification:: I certify this patient will need inpatient services for at least 2 midnights  PT Class (Do Not Modify): Inpatient [101]  PT Acc Code (Do Not Modify): Private [1]       B Medical/Surgery History Past Medical History:  Diagnosis Date  . DVT (deep venous thrombosis) ([place])   . GERD (gastroesophageal reflux disease)   . Hypertension   . Polyneuropathy    Past Surgical History:  Procedure Laterality Date  . NO PAST SURGERIES       A IV Location/Drains/Wounds Patient Lines/Drains/Airways Status   Active Line/Drains/Airways    Name:   Placement date:   Placement time:   Site:   Days:   Peripheral IV 03/14/19 Anterior;Right Forearm   03/14/19    1303    Forearm   less than 1   Midline  Single Lumen 03/14/19 Midline Left Cephalic 8 cm 0 cm   0000000    [id number]    Cephalic   less than 1          Intake/Output Last 24 hours  Intake/Output Summary (Last 24 hours) at 03/14/2019 2045 Last data filed at 03/14/2019 2015 Gross per 24 hour  Intake 189.58 ml  Output -  Net 189.58 ml    Labs/Imaging Results for orders placed or performed during the hospital encounter of 03/14/19 (from the past 48 hour(s))  CBC with Differential     Status: Abnormal   Collection Time: 03/14/19 11:16 AM  Result Value Ref Range   WBC 11.4 (H) 4.0 - 10.5 K/uL   RBC 0.99 (L) 3.87 - 5.11 MIL/uL   Hemoglobin 4.0 (LL) 12.0 - 15.0 g/dL    Comment: This critical result has verified and been called to AMBER PAYNE by Sable Feil on 09 23 2020 at 1200, and has been read back.    HCT 12.4 (LL) 36.0 - 46.0 %    Comment: This critical result has verified and been called to AMBER PAYNE by Sable Feil on 09 23 2020 at 1200, and has been read back.    MCV 125.3 (H) 80.0 - 100.0 fL   MCH 40.4 (H) 26.0 - 34.0 pg   MCHC 32.3 30.0 - 36.0 g/dL   RDW 21.0 (H) 11.5 - 15.5 %   Platelets 381 150 - 400  K/uL   nRBC 0.9 (H) 0.0 - 0.2 %   Neutrophils Relative % 82 %   Neutro Abs 9.4 (H) 1.7 - 7.7 K/uL   Lymphocytes Relative 12 %   Lymphs Abs 1.4 0.7 - 4.0 K/uL   Monocytes Relative 3 %   Monocytes Absolute 0.3 0.1 - 1.0 K/uL   Eosinophils Relative 2 %   Eosinophils Absolute 0.2 0.0 - 0.5 K/uL   Basophils Relative 0 %   Basophils Absolute 0.0 0.0 - 0.1 K/uL   Immature Granulocytes 1 %   Abs Immature Granulocytes 0.10 (H) 0.00 - 0.07 K/uL   Hypersegmented Neutrophils PRESENT    Tear Drop Cells PRESENT    Spherocytes PRESENT     Comment: Performed at [facility], [address] [place], [place] [id number]  Basic metabolic panel     Status: Abnormal   Collection Time: 03/14/19 11:16 AM  Result Value Ref Range   Sodium 135 135 - 145 mmol/L   Potassium 4.3 3.5 - 5.1 mmol/L   Chloride 103 98 - 111 mmol/L    CO2 25 22 - 32 mmol/L   Glucose, Bld 113 (H) 70 - 99 mg/dL   BUN 8 6 - 20 mg/dL   Creatinine, Ser 0.54 0.44 - 1.00 mg/dL   Calcium 8.5 (L) 8.9 - 10.3 mg/dL   GFR calc non Af Amer >60 >60 mL/min   GFR calc Af Amer >60 >60 mL/min   Anion gap 7 5 - 15    Comment: Performed at [facility], [address], [place], [place] [address]  Pathologist smear review     Status: None   Collection Time: 03/14/19 11:16 AM  Result Value Ref Range   Path Review Peripheral blood smear is reviewed.     Comment: Severe macrocytic anemia with moderate anisopoikilocytosis, including spherocytes and teardrop cells. No increase in schistocytes. Mild leukocytosis, with hypersegmented neutrophils. Normal platelet count and morphology. Presence of macrocytic erythrocytes and hypersegmented neutrophils may be suggestive of nutritional deficiency (vitamin B12, folate), although, given the severity of this anemia, other potential causes should also be considered. Reviewed by Kathi Simpers, M.D. Performed at [facility], [address] [place], [place] [address]   ABO/Rh     Status: None   Collection Time: 03/14/19 11:16 AM  Result Value Ref Range   ABO/RH(D)      B POS Performed at [facility], [address] [place], [place] [address]   Prepare RBC     Status: None   Collection Time: 03/14/19 12:04 PM  Result Value Ref Range   Order Confirmation      ORDER PROCESSED BY BLOOD BANK Performed at [facility], [address] [place],  [address]   Type and screen [place]     Status: None (Preliminary result)   Collection Time: 03/14/19 12:43 PM  Result Value Ref Range   ABO/RH(D) B POS    Antibody Screen NEG    Sample Expiration 03/17/2019,2359    Unit Number [id number]    Blood Component Type RED CELLS,LR    Unit division 00    Status of Unit ISSUED    Transfusion Status OK TO TRANSFUSE    Crossmatch Result Compatible     Unit Number [id number]    Blood Component Type RED CELLS,LR    Unit division 00    Status of Unit ISSUED    Transfusion Status OK TO TRANSFUSE    Crossmatch Result  Compatible Performed at [facility], [address] [place], [place] [address]   SARS Coronavirus 2 [facility] order, Performed in [facility] hospital lab) Nasopharyngeal Nasopharyngeal Swab     Status: None   Collection Time: 03/14/19  1:31 PM   Specimen: Nasopharyngeal Swab  Result Value Ref Range   SARS Coronavirus 2 NEGATIVE NEGATIVE    Comment: (NOTE) If result is NEGATIVE SARS-CoV-2 target nucleic acids are NOT DETECTED. The SARS-CoV-2 RNA is generally detectable in upper and lower  respiratory specimens during the acute phase of infection. The lowest  concentration of SARS-CoV-2 viral copies this assay can detect is 250  copies / mL. A negative result does not preclude SARS-CoV-2 infection  and should not be used as the sole basis for treatment or other  patient management decisions.  A negative result may occur with  improper specimen collection / handling, submission of specimen other  than nasopharyngeal swab, presence of viral mutation(s) within the  areas targeted by this assay, and inadequate number of viral copies  (<250 copies / mL). A negative result must be combined with clinical  observations, patient history, and epidemiological information. If result is POSITIVE SARS-CoV-2 target nucleic acids are DETECTED. The SARS-CoV-2 RNA is generally detectable in upper and lower  respiratory specimens dur ing the acute phase of infection.  Positive  results are indicative of active infection with SARS-CoV-2.  Clinical  correlation with patient history and other diagnostic information is  necessary to determine patient infection status.  Positive results do  not rule out bacterial infection or co-infection with other viruses. If result is PRESUMPTIVE POSTIVE SARS-CoV-2 nucleic acids MAY BE  PRESENT.   A presumptive positive result was obtained on the submitted specimen  and confirmed on repeat testing.  While 2019 novel coronavirus  (SARS-CoV-2) nucleic acids may be present in the submitted sample  additional confirmatory testing may be necessary for epidemiological  and / or clinical management purposes  to differentiate between  SARS-CoV-2 and other Sarbecovirus currently known to infect humans.  If clinically indicated additional testing with an alternate test  methodology [phone]) is advised. The SARS-CoV-2 RNA is generally  detectable in upper and lower respiratory sp ecimens during the acute  phase of infection. The expected result is Negative. Fact Sheet for Patients:  StrictlyIdeas.no Fact Sheet for Healthcare Providers: BankingDealers.co.za This test is not yet approved or cleared by the [place] FDA and has been authorized for detection and/or diagnosis of SARS-CoV-2 by FDA under an Emergency Use Authorization (EUA).  This EUA will remain in effect (meaning this test can be used) for the duration of the COVID-19 declaration under Section 564(b)(1) of the Act, 21 U.S.C. section 360bbb-3(b)(1), unless the authorization is terminated or revoked sooner. Performed at [facility], [address] [place], [place] [address]   Urinalysis, Complete w Microscopic     Status: Abnormal   Collection Time: 03/14/19  2:13 PM  Result Value Ref Range   Color, Urine AMBER (A) YELLOW    Comment: BIOCHEMICALS MAY BE AFFECTED BY COLOR   APPearance CLOUDY (A) CLEAR   Specific Gravity, Urine 1.015 1.005 - 1.030   pH 7.0 5.0 - 8.0   Glucose, UA NEGATIVE NEGATIVE mg/dL   Hgb urine dipstick NEGATIVE NEGATIVE   Bilirubin Urine SMALL (A) NEGATIVE   Ketones, ur NEGATIVE NEGATIVE mg/dL   Protein, ur 30 (A) NEGATIVE mg/dL   Nitrite POSITIVE (A) NEGATIVE   Leukocytes,Ua MODERATE (A) NEGATIVE   RBC / HPF  11-20 0 - 5  RBC/hpf   WBC, UA >50 (H) 0 - 5 WBC/hpf   Bacteria, UA RARE (A) NONE SEEN   Squamous Epithelial / LPF 21-50 0 - 5   WBC Clumps PRESENT    Mucus PRESENT    Non Squamous Epithelial PRESENT (A) NONE SEEN    Comment: Performed at [facility], [address] [place], [place] [address]  Lactic acid, plasma     Status: None   Collection Time: 03/14/19  4:08 PM  Result Value Ref Range   Lactic Acid, Venous 1.0 0.5 - 1.9 mmol/L    Comment: Performed at [facility], [address] [place], [place] [address]  Hepatic function panel     Status: Abnormal   Collection Time: 03/14/19  6:59 PM  Result Value Ref Range   Total Protein 5.9 (L) 6.5 - 8.1 g/dL   Albumin 3.3 (L) 3.5 - 5.0 g/dL   AST 67 (H) 15 - 41 U/L   ALT 51 (H) 0 - 44 U/L   Alkaline Phosphatase 64 38 - 126 U/L   Total Bilirubin 1.2 0.3 - 1.2 mg/dL   Bilirubin, Direct 0.3 (H) 0.0 - 0.2 mg/dL   Indirect Bilirubin 0.9 0.3 - 0.9 mg/dL    Comment: Performed at [facility], [address] [place], [place] [address]  Hemoglobin     Status: Abnormal   Collection Time: 03/14/19  6:59 PM  Result Value Ref Range   Hemoglobin 5.8 (L) 12.0 - 15.0 g/dL    Comment: POST TRANSFUSION SPECIMEN Performed at [facility], [address], [place], [place] [address]    Dg Chest Portable 1 View  Result Date: 03/14/2019 CLINICAL DATA:  Shortness of breath EXAM: PORTABLE CHEST 1 VIEW COMPARISON:  August 31, 2009 FINDINGS: The heart size and mediastinal contours are within normal limits. There is hyperinflation of the upper lung zones with a probable right apical bullae. Mildly increased interstitial markings seen at the right lung base and left upper lung. No large airspace consolidation or pleural effusion. The visualized skeletal structures are unremarkable. IMPRESSION: Nonspecific mildly increased interstitial markings at the right lung base and left upper lung. This could be due to early infectious etiology  and/or atelectasis. Electronically Signed   By: Prudencio Pair M.D.   On: 03/14/2019 10:48    Pending Labs Unresulted Labs (From admission, onward)    Start     Ordered   03/14/19 1957  Vitamin B12  Add-on,   AD     03/14/19 1956   03/14/19 1954  Hepatic function panel  Add-on,   AD     03/14/19 1953   03/14/19 1858  Hemoglobin  ONCE - STAT,   STAT    Comments: Call MD, If < 7, Or drop  More than 1 gram/dl from previous report.    03/14/19 1857   03/14/19 1819  Thyroid Panel With TSH  Once,   STAT     03/14/19 1818   03/14/19 1818  Vitamin B12  Once,   STAT     03/14/19 1818   03/14/19 1818  Folate  Once,   STAT     03/14/19 1818   03/14/19 1653  Urine Culture  Add-on,   AD     03/14/19 1652   03/14/19 1629  Hemoglobin  Now then every 8 hours,   STAT    Comments: Call MD, If < 7, Or drop  More than 1 gram/dl from previous  report.    03/14/19 1628   03/14/19 1534  Blood culture (routine x 2)  BLOOD CULTURE X 2,   STAT     03/14/19 1533   Signed and Held  HIV Antibody  (Routine Testing)  Once,   R     Signed and Held   Signed and Held  Basic metabolic panel  Tomorrow morning,   R     Signed and Held   Signed and Held  CBC  Tomorrow morning,   R     Signed and Held          Vitals/Pain Today's Vitals   03/14/19 1800 03/14/19 1830 03/14/19 1900 03/14/19 1948  BP: 112/71 (!) 113/59 130/64 127/68  Pulse: 100  99 100  Resp: (!) 22  (!) 22 (!) 21  Temp:    98.5 F (36.9 C)  TempSrc:    Oral  SpO2: 98%  95% 98%  Weight:      Height:      PainSc:    0-No pain    Isolation Precautions No active isolations  Medications Medications  pantoprazole (PROTONIX) 80 mg in sodium chloride 0.9 % 250 mL (0.32 mg/mL) infusion (8 mg/hr Intravenous New Bag/Given 03/14/19 1410)  pantoprazole (PROTONIX) injection 40 mg (has no administration in time range)  sodium chloride flush (NS) 0.9 % injection 10-40 mL (10 mLs Intracatheter Given 03/14/19 1439)  sodium chloride flush (NS) 0.9 %  injection 10-40 mL (has no administration in time range)  0.9 %  sodium chloride infusion (has no administration in time range)  cefTRIAXone (ROCEPHIN) 1 g in sodium chloride 0.9 % 100 mL IVPB (1 g Intravenous Not Given 03/14/19 1653)  nicotine (NICODERM CQ - dosed in mg/24 hours) patch 21 mg (21 mg Transdermal Patch Applied 03/14/19 1946)  thiamine (B-1) injection 100 mg (has no administration in time range)  folic acid injection 1 mg (has no administration in time range)  vitamin C (ASCORBIC ACID) tablet 500 mg (has no administration in time range)  0.9 %  sodium chloride infusion (10 mL/hr Intravenous New Bag/Given 03/14/19 1438)  pantoprazole (PROTONIX) 80 mg in sodium chloride 0.9 % 100 mL IVPB (0 mg Intravenous Stopped 03/14/19 1404)  cefTRIAXone (ROCEPHIN) 1 g in sodium chloride 0.9 % 100 mL IVPB (0 g Intravenous Stopped 03/14/19 1706)  furosemide (LASIX) injection 20 mg (20 mg Intravenous Given 03/14/19 1946)    Mobility walks Moderate fall risk   Focused Assessments Patient SOB with exertion. Currently on 2L [place]. Fine crackles in the bases, 20 of furosemide given with good UOP.   R Recommendations: See Admitting Provider Note  Report given to:   Additional Notes:

## 2019-03-14 NOTE — Progress Notes (Signed)
Family Meeting Note  Advance Directive:yes  Today a meeting took place with the Patient.  The following clinical team members were present during this meeting:MD  The following were discussed:Patient's diagnosis: Chronic alcoholism, severe anemia and GI bleed, UTI, pulmonary edema, hypertension, active smoking, Patient's progosis: Unable to determine and Goals for treatment: Full Code  Additional follow-up to be provided: GI  Time spent during discussion:20 minutes  Vaughan Basta, MD

## 2019-03-14 NOTE — H&P (Signed)
[facility] NAME: Joanna Garcia    MR#:  [id number]  DATE OF BIRTH:  1975/10/09  DATE OF ADMISSION:  03/14/2019  PRIMARY CARE PHYSICIAN: Letta Median, MD   REQUESTING/REFERRING PHYSICIAN: Archie Balboa  CHIEF COMPLAINT:   Chief Complaint  Patient presents with  . Shortness of Breath    HISTORY OF PRESENT ILLNESS: Joanna Garcia  is a 43 y.o. female with a known history of DVT, gastroesophageal reflux disease, hypertension, polyneuropathy-is feeling increasingly weak which is progressively getting worse to the point now she feels dizzy when trying to stand up and feels extremely short of breath and tired on minimal exertion of walking in house.  Concern with this she came to emergency room. She denies any fever chills.  She denies any abdominal pain vomiting or diarrhea.  She denies noticing any dark stools or bleeding. In ER her hemoglobin was noted at 4.  Patient denies any over-the-counter NSAIDs use.  Her stool guaiac was positive by ER physician.  He ordered 2 units of blood transfusion and advised to admit to hospitalist service Patient urinalysis was also noted to be positive for UTI so started on antibiotics by ER physician already.  Patient was feeling somewhat short of breath and was requiring supplemental oxygen in emergency room.  Chest x-ray reported some pulmonary edema.  PAST MEDICAL HISTORY:   Past Medical History:  Diagnosis Date  . DVT (deep venous thrombosis) ([place])   . GERD (gastroesophageal reflux disease)   . Hypertension   . Polyneuropathy     PAST SURGICAL HISTORY:  Past Surgical History:  Procedure Laterality Date  . NO PAST SURGERIES      SOCIAL HISTORY:  Social History   Tobacco Use  . Smoking status: Current Every Day Smoker    Packs/day: 0.50  . Smokeless tobacco: Never Used  Substance Use Topics  . Alcohol use: Yes    Comment: 21 Oz daily    FAMILY HISTORY:  Family History  Problem  Relation Age of Onset  . Hypertension Mother   . Hyperlipidemia Mother   . Heart disease Mother   . COPD Mother   . Hypertension Father   . Hyperlipidemia Father   . Cancer Father        prostate    DRUG ALLERGIES: No Known Allergies  REVIEW OF SYSTEMS:   CONSTITUTIONAL: No fever, have fatigue or weakness.  EYES: No blurred or double vision.  EARS, NOSE, AND THROAT: No tinnitus or ear pain.  RESPIRATORY: No cough, have shortness of breath, no wheezing or hemoptysis.  CARDIOVASCULAR: No chest pain, have orthopnea, no edema.  GASTROINTESTINAL: No nausea, vomiting, diarrhea or abdominal pain.  GENITOURINARY: No dysuria, hematuria.  ENDOCRINE: No polyuria, nocturia,  HEMATOLOGY: No anemia, easy bruising or bleeding SKIN: No rash or lesion. MUSCULOSKELETAL: No joint pain or arthritis.   NEUROLOGIC: No tingling, numbness, weakness.  PSYCHIATRY: No anxiety or depression.   MEDICATIONS AT HOME:  Prior to Admission medications   Medication Sig Start Date End Date Taking? Authorizing Provider  omeprazole (PRILOSEC) 20 MG capsule Take 20 mg by mouth daily.    Yes [provider]  gabapentin (NEURONTIN) 300 MG capsule Take 1 capsule (300 mg total) by mouth 3 (three) times daily. 01/31/19 03/02/19  Blake Divine, MD  lisinopril (ZESTRIL) 10 MG tablet Take 10 mg by mouth daily.     [provider]  naproxen (NAPROSYN) 500 MG tablet Take 1 tablet (500 mg total)  by mouth 2 (two) times daily with a meal. Patient not taking: Reported on 02/12/2019 01/31/19 01/31/20  Blake Divine, MD      PHYSICAL EXAMINATION:   VITAL SIGNS: Blood pressure 130/64, pulse 99, temperature 98.9 F (37.2 C), temperature source Oral, resp. rate (!) 22, height 5\' 4"  (1.626 m), weight 72.6 kg, last menstrual period 08/28/2017, SpO2 95 %.  GENERAL:  43 y.o.-year-old patient lying in the bed with no acute distress.  EYES: Pupils equal, round, reactive to light and accommodation. No scleral icterus.   Extremely pale.  Extraocular muscles intact.  HEENT: Head atraumatic, normocephalic. Oropharynx and nasopharynx clear.  NECK:  Supple, no jugular venous distention. No thyroid enlargement, no tenderness.  LUNGS: Normal breath sounds bilaterally, no wheezing, rales,rhonchi or crepitation. No use of accessory muscles of respiration.  CARDIOVASCULAR: S1, S2 normal. No murmurs, rubs, or gallops.  ABDOMEN: Soft, nontender, nondistended. Bowel sounds present. No organomegaly or mass.  EXTREMITIES: No pedal edema, cyanosis, or clubbing.  NEUROLOGIC: Cranial nerves II through XII are intact. Muscle strength 5/5 in all extremities. Sensation intact. Gait not checked.  PSYCHIATRIC: The patient is alert and oriented x 3.  SKIN: No obvious rash, lesion, or ulcer.   LABORATORY PANEL:   CBC Recent Labs  Lab 03/14/19 1116 03/14/19 1859  WBC 11.4*  --   HGB 4.0* 5.8*  HCT 12.4*  --   PLT 381  --   MCV 125.3*  --   MCH 40.4*  --   MCHC 32.3  --   RDW 21.0*  --   LYMPHSABS 1.4  --   MONOABS 0.3  --   EOSABS 0.2  --   BASOSABS 0.0  --    ------------------------------------------------------------------------------------------------------------------  Chemistries  Recent Labs  Lab 03/14/19 1116 03/14/19 1859  NA 135  --   K 4.3  --   CL 103  --   CO2 25  --   GLUCOSE 113*  --   BUN 8  --   CREATININE 0.54  --   CALCIUM 8.5*  --   AST  --  67*  ALT  --  51*  ALKPHOS  --  64  BILITOT  --  1.2   ------------------------------------------------------------------------------------------------------------------ estimated creatinine clearance is 90.4 mL/min (by C-G formula based on SCr of 0.54 mg/dL). ------------------------------------------------------------------------------------------------------------------ No results for input(s): TSH, T4TOTAL, T3FREE, THYROIDAB in the last 72 hours.  Invalid input(s): FREET3   Coagulation profile No results for input(s): INR, PROTIME in  the last 168 hours. ------------------------------------------------------------------------------------------------------------------- No results for input(s): DDIMER in the last 72 hours. -------------------------------------------------------------------------------------------------------------------  Cardiac Enzymes No results for input(s): CKMB, TROPONINI, MYOGLOBIN in the last 168 hours.  Invalid input(s): CK ------------------------------------------------------------------------------------------------------------------ Invalid input(s): POCBNP  ---------------------------------------------------------------------------------------------------------------  Urinalysis    Component Value Date/Time   COLORURINE AMBER (A) 03/14/2019 1413   APPEARANCEUR CLOUDY (A) 03/14/2019 1413   LABSPEC 1.015 03/14/2019 1413   PHURINE 7.0 03/14/2019 1413   GLUCOSEU NEGATIVE 03/14/2019 1413   HGBUR NEGATIVE 03/14/2019 1413   BILIRUBINUR SMALL (A) 03/14/2019 1413   KETONESUR NEGATIVE 03/14/2019 1413   PROTEINUR 30 (A) 03/14/2019 1413   NITRITE POSITIVE (A) 03/14/2019 1413   LEUKOCYTESUR MODERATE (A) 03/14/2019 1413     RADIOLOGY: Dg Chest Portable 1 View  Result Date: 03/14/2019 CLINICAL DATA:  Shortness of breath EXAM: PORTABLE CHEST 1 VIEW COMPARISON:  August 31, 2009 FINDINGS: The heart size and mediastinal contours are within normal limits. There is hyperinflation of the upper lung zones with a probable right  apical bullae. Mildly increased interstitial markings seen at the right lung base and left upper lung. No large airspace consolidation or pleural effusion. The visualized skeletal structures are unremarkable. IMPRESSION: Nonspecific mildly increased interstitial markings at the right lung base and left upper lung. This could be due to early infectious etiology and/or atelectasis. Electronically Signed   By: Prudencio Pair M.D.   On: 03/14/2019 10:48    EKG: Orders placed or performed  during the hospital encounter of 03/14/19  . EKG 12-Lead  . EKG 12-Lead    IMPRESSION AND PLAN:  *Symptomatic anemia GI bleed  Her hemoglobin was around 10 last month. Now this 4. Patient denies noticing any frank bleeding. Guaiac is positive. Keep on clear liquid diet for now.  Protonix drip is started by ER. IV fluids. Transfuse 2 units and follow serial hemoglobin. GI is contacted.  Patient with significantly elevated MCV, I will order vitamin B12 level to check and will replace thiamine and folic acid.  *UTI Urine culture is sent, continue IV ceftriaxone.  *Pulmonary edema Patient also had episode of shortness of breath while receiving second unit of blood transfusion. Chest x-ray reports some pulmonary edema. I will get echocardiogram. Lasix 1 dose given to help with diuresis.  *Chronic Alcoholism Currently no withdrawal symptoms.  Keep vigilant. Counseled to quit drinking.  *Active smoking Counseled to quit smoking for 4 minutes and offered nicotine patch.  *Hypertension Due to GI bleed and feeling of dizziness and significant anemia I will hold lisinopril for now.  All the records are reviewed and case discussed with ED provider. Management plans discussed with the patient, family and they are in agreement.  CODE STATUS: Full code   TOTAL TIME TAKING CARE OF THIS PATIENT: 45 minutes.    Vaughan Basta M.D on 03/14/2019   Between 7am to 6pm - Pager - [phone]  After 6pm go to [email or web address] - password [email or web address] [facility]  Office  [phone]  CC: Primary care physician; Letta Median, MD   Note: This dictation was prepared with Dragon dictation along with smaller phrase technology. Any transcriptional errors that result from this process are unintentional.

## 2019-03-14 NOTE — ED Notes (Signed)
Pt began saying she was having increased SHOB and flushed/hot. Dr Jari Pigg to bedside, ordered to stop transfusion. Dr Anselm Jungling made aware. Chest xray ordered. VS stable.

## 2019-03-14 NOTE — Consult Note (Signed)
GI Inpatient Consult Note  Reason for Consult: Symptomatic anemia   Attending Requesting Consult: Dr. Anselm Jungling  History of Present Illness: Joanna Garcia is a 43 y.o. female seen for evaluation of symptomatic anemia at the request of Dr. Anselm Jungling. Pt reportedly had labs done at outside facility several days ago for a regular check up and found to have a hemoglobin of 4.5. She did not return phone calls from the office, so the police were asked to come and do a check-up and bring her to the ED. Patient presented to the ED this morning by the police. On presentation to the ED, labs were significant for hemoglobin 4.0 with a MCV of 125.3, WBC 11.4. Per chart review, labs one month ago showed hemoglobin 10.0, MCV 127. She reports over the past several months she has noticed increased weakness and shortness of breath with exertion or with just talking. She has not noticed any gross GI bleeding in the form of hematemesis, hematochezia, or melena. She does report a change in bowel habits over the past three months where she only has one BM per week. Prior to this, she reports formed stools daily. She has not taken any OTC laxatives, enemas, or suppositories. She reports her BMs are brown in color and usually either hard balls of stool or stools that look like a pinky finger. She denies any bright red bloody or tarry stools. She does endorse some epigastric abd pain which has been intermittent over the past 3 months. She describes the pain as burning in nature and sometimes crampy. She reports she cannot identify any aggravating or alleviating factors. Appetite and diet has remained fairly stable. She does report for the last several years she has taken [facility] powder every single day for headaches and minor aches and pains. She has never had an EGD or colonoscopy. She does report a family hx of colon polyps in her mother. No known family hx of colon cancer or IBD. She has been taking omeprazole daily for management of  her GERD symptoms, which seems to not have been working as well over the past three months. She has noticed some esophageal dysphagia to both solids and liquids were things can feel like they get stuck at level of sternal notch. This has not prompted any regurgitation episodes. No odynophagia. Denies any unintentional weight loss.   Last Colonoscopy: N/A Last Endoscopy: N/A   Past Medical History:  Past Medical History:  Diagnosis Date  . DVT (deep venous thrombosis) ([place])   . GERD (gastroesophageal reflux disease)   . Hypertension   . Polyneuropathy     Problem List: Patient Active Problem List   Diagnosis Date Noted  . Symptomatic anemia 03/14/2019  . GI bleed 03/14/2019  . Superficial thrombophlebitis 02/12/2019  . Thrombocytosis ([place]) 02/12/2019  . GERD (gastroesophageal reflux disease) 02/12/2019  . Essential hypertension 02/12/2019    Past Surgical History: Past Surgical History:  Procedure Laterality Date  . NO PAST SURGERIES      Allergies: No Known Allergies  Home Medications: (Not in a hospital admission)  Home medication reconciliation was completed with the patient.   Scheduled Inpatient Medications:   . [START ON 03/18/2019] pantoprazole  40 mg Intravenous Q12H  . sodium chloride flush  10-40 mL Intracatheter Q12H    Continuous Inpatient Infusions:   . sodium chloride    . cefTRIAXone (ROCEPHIN)  IV    . pantoprozole (PROTONIX) infusion 8 mg/hr (03/14/19 1410)    PRN Inpatient Medications:  sodium  chloride flush  Family History: family history includes COPD in her mother; Cancer in her father; Heart disease in her mother; Hyperlipidemia in her father and mother; Hypertension in her father and mother.  The patient's family history is negative for inflammatory bowel disorders, GI malignancy, or solid organ transplantation.  Social History:   reports that she has been smoking. She has been smoking about 0.50 packs per day. She has never used smokeless  tobacco. She reports current alcohol use. She reports previous drug use. The patient denies ETOH, tobacco, or drug use.   Review of Systems: Constitutional: Weight is stable.  Eyes: No changes in vision. ENT: No oral lesions, sore throat.  GI: see HPI.  Heme/Lymph: No easy bruising.  CV: No chest pain.  GU: No hematuria.  Integumentary: No rashes.  Neuro: No headaches.  Psych: No depression/anxiety.  Endocrine: No heat/cold intolerance.  Allergic/Immunologic: No urticaria.  Resp: No cough, SOB.  Musculoskeletal: No joint swelling.    Physical Examination: BP 123/74   Pulse 99   Temp 98.9 F (37.2 C) (Oral)   Resp (!) 22   Ht 5\' 4"  (1.626 m)   Wt 72.6 kg   LMP 08/28/2017   SpO2 96%   BMI 27.46 kg/m  Gen: NAD, alert and oriented x 4 HEENT: PEERLA, EOMI, Neck: supple, no JVD or thyromegaly Chest: CTA bilaterally, no wheezes, crackles, or other adventitious sounds CV: RRR, no m/g/c/r Abd: soft, mild epigastric tenderness to palpation, ND, +BS in all four quadrants; no HSM, guarding, ridigity, or rebound tenderness Ext: no edema, well perfused with 2+ pulses, Skin: no rash or lesions noted, skin pallor Lymph: no LAD  Data: Lab Results  Component Value Date   WBC 11.4 (H) 03/14/2019   HGB 4.0 (LL) 03/14/2019   HCT 12.4 (LL) 03/14/2019   MCV 125.3 (H) 03/14/2019   PLT 381 03/14/2019   Recent Labs  Lab 03/14/19 1116  HGB 4.0*   Lab Results  Component Value Date   NA 135 03/14/2019   K 4.3 03/14/2019   CL 103 03/14/2019   CO2 25 03/14/2019   BUN 8 03/14/2019   CREATININE 0.54 03/14/2019   Lab Results  Component Value Date   ALT 92 (H) 01/31/2019   AST 108 (H) 01/31/2019   ALKPHOS 80 01/31/2019   BILITOT 0.8 01/31/2019   No results for input(s): APTT, INR, PTT in the last 168 hours. Assessment/Plan:  43 y/o Caucasian female with a PMH of Hx of DVT, GERD, HTN, and polyneuropathy admitted to hospital for symptomatic anemia  1. Severe symptomatic anemia,  macrocytic 2. Epigastric abd pain 3. Overuse of NSAIDs - daily BC powders 4. Change in bowel habits x 3 months  -Severe macrocytic anemia upon presentation to the ED with a hemoglobin of 4.0, MCV >125. One month ago was 10.0. -Differential includes severe vitamin and nutrient deficiencies such as Vitamin B12, folate, alcoholism, hypothyroidism, or myelodysplastic syndrome. Also must consider occult GI blood loss. -No signs of active GI bleeding at this time. No hematemesis, hematochezia, or melena -Agree with transfusions x 2units pRBCs -Continue to monitor H&H. Transfuse for Hgb <7.0. -Agree with IV acid suppression -She should have further work-up of macrocytic anemia, consider referral to hematology -Given her longstanding history of NSAID use along with epigastric abd pain, must consider etiologies such as peptic ulcer disease, gastritis +/- H pylori, esophagitis, duodenitis -Continue to monitor closely with serial H&H checks -Consider EGD and colonoscopy when clinically feasible -We will continue to follow  along -Clear liquid diet is okay for now   Thank you for the consult. Please call with questions or concerns.  Reeves Forth [facility] [phone] (Cell)

## 2019-03-14 NOTE — ED Notes (Signed)
Lab contacted to send phlebotomist for blood culture draw 

## 2019-03-14 NOTE — ED Notes (Signed)
DG chest at bedside

## 2019-03-14 NOTE — ED Notes (Signed)
IV team at bedside 

## 2019-03-14 NOTE — ED Notes (Signed)
Cultures collected by lab 

## 2019-03-14 NOTE — ED Triage Notes (Addendum)
Pt arrived via EMS from home with chief c/o of [facility] and abnormal labs. Stated the sherriff was at her house to check on her because she had abnormal labs from the doctor's office and her family was unable to get in contact with her, pt unsure which labs were abnormal. Pt also c/o SHOB x2 weeks and blood clot in left arm 3 weeks ago. Pt sinus tach on monitor, NAD noted.

## 2019-03-14 NOTE — ED Notes (Signed)
Consent placed with patients chart

## 2019-03-14 NOTE — ED Notes (Addendum)
Called blood bank to verify time blood left the blood bank and was told during the 1400 hour. Blood due to be discarded during the 1800 hour. Blood transfusion stopped immediately after being informed of time to be discarded.

## 2019-03-14 NOTE — ED Notes (Signed)
IV team nurse relayed to this RN she would not place IV until orders were in for medicines/fluids. Asked this RN to contact lab to send phlebotomist

## 2019-03-14 NOTE — Progress Notes (Signed)
Unsuccessful IV attempt L forearm, discussed with RN, recommended she have phlebotomy draw labs, will reattempt IV if meds ordered.

## 2019-03-14 NOTE — ED Notes (Signed)
New bag of protonix requested from pharmacy and requested it be sent to [facility]

## 2019-03-14 NOTE — ED Provider Notes (Signed)
[facility] Emergency Department Provider Note   ____________________________________________   I have reviewed the triage vital signs and the nursing notes.   HISTORY  Chief Complaint Shortness of Breath   History limited by: Not Limited   HPI Joanna Garcia is a 43 y.o. female who presents to the emergency department today because of concerns for shortness of breath and abnormal lab values.  Patient states that she has been getting progressively short of breath for quite a while that is gotten significantly worse in the past 2 days.  She notices shortness of breath with exertion or with speech.  She did finally see her primary care doctor for the past few days and had blood work drawn.  Apparently there was a significant abnormality because after the clinic cannot get in touch with the patient they sent the sheriff's department to her house.  She is unsure which lab value was abnormal.  She has not noticed any bloody stool or black or tarry stool.  Has not noticed any blood in her urine.   Records reviewed. Per medical record review patient has a history of recent ER visit for superficial thrombophlebitis.   Past Medical History:  Diagnosis Date  . DVT (deep venous thrombosis) ([place])   . GERD (gastroesophageal reflux disease)   . Hypertension   . Polyneuropathy     Patient Active Problem List   Diagnosis Date Noted  . Superficial thrombophlebitis 02/12/2019  . Thrombocytosis ([place]) 02/12/2019  . GERD (gastroesophageal reflux disease) 02/12/2019  . Essential hypertension 02/12/2019    Past Surgical History:  Procedure Laterality Date  . NO PAST SURGERIES      Prior to Admission medications   Medication Sig Start Date End Date Taking? Authorizing Provider  gabapentin (NEURONTIN) 300 MG capsule Take 1 capsule (300 mg total) by mouth 3 (three) times daily. 01/31/19 03/02/19  Blake Divine, MD  lisinopril (ZESTRIL) 10 MG tablet Take by mouth.     [provider]  naproxen (NAPROSYN) 500 MG tablet Take 1 tablet (500 mg total) by mouth 2 (two) times daily with a meal. Patient not taking: Reported on 02/12/2019 01/31/19 01/31/20  Blake Divine, MD  omeprazole (PRILOSEC) 20 MG capsule Take by mouth.    [provider]    Allergies Patient has no known allergies.  Family History  Problem Relation Age of Onset  . Hypertension Mother   . Hyperlipidemia Mother   . Heart disease Mother   . COPD Mother   . Hypertension Father   . Hyperlipidemia Father   . Cancer Father        prostate    Social History Social History   Tobacco Use  . Smoking status: Current Every Day Smoker    Packs/day: 0.50  . Smokeless tobacco: Never Used  Substance Use Topics  . Alcohol use: Yes  . Drug use: Not Currently    Review of Systems Constitutional: No fever/chills Eyes: No visual changes. ENT: No sore throat. Cardiovascular: Denies chest pain. Respiratory: Positive for shortness of breath. Gastrointestinal: No abdominal pain.  No nausea, no vomiting.  No diarrhea.   Genitourinary: Negative for dysuria. Musculoskeletal: Negative for back pain. Skin: Negative for rash. Neurological: Negative for headaches, focal weakness or numbness.  ____________________________________________   PHYSICAL EXAM:  VITAL SIGNS: ED Triage Vitals  Enc Vitals Group     BP 03/14/19 1001 111/63     Pulse Rate 03/14/19 1001 (!) 123     Resp 03/14/19 1001 20  Temp 03/14/19 1001 99.7 F (37.6 C)     Temp Source 03/14/19 1001 Oral     SpO2 03/14/19 1001 95 %     Weight 03/14/19 1002 160 lb (72.6 kg)     Height 03/14/19 1002 5\' 4"  (1.626 m)     Head Circumference --      Peak Flow --      Pain Score 03/14/19 1002 0   Constitutional: Alert and oriented.  Eyes: Conjunctivae are normal.  ENT      Head: Normocephalic and atraumatic.      Nose: No congestion/rhinnorhea.      Mouth/Throat: Pale mucosa.      Neck: No  stridor. Hematological/Lymphatic/Immunilogical: No cervical lymphadenopathy. Cardiovascular: Tachycardic, regular rhythm.  No murmurs, rubs, or gallops.  Respiratory: Normal respiratory effort without tachypnea nor retractions. Breath sounds are clear and equal bilaterally. No wheezes/rales/rhonchi. Gastrointestinal: Soft and non tender. No rebound. No guarding.  Genitourinary: Deferred Musculoskeletal: Normal range of motion in all extremities. No lower extremity edema. Neurologic:  Normal speech and language. No gross focal neurologic deficits are appreciated.  Skin:  Skin is warm, dry and intact. No rash noted. Psychiatric: Mood and affect are normal. Speech and behavior are normal. Patient exhibits appropriate insight and judgment.  ____________________________________________    LABS (pertinent positives/negatives)  CBC wbc 11.4, hgb 4.0, plt 381 BMP wnl except glu 113, ca 8.5  ____________________________________________   EKG  I, Nance Pear, attending physician, personally viewed and interpreted this EKG  EKG Time: 1002 Rate: 119 Rhythm: sinus tachycardia Axis: normal Intervals: qtc 403 QRS: narrow ST changes: no st elevation Impression: abnormal ekg  ____________________________________________    RADIOLOGY  CXR Findings potentially concerning for early infection vs atelectasis  ____________________________________________   PROCEDURES  Procedures  CRITICAL CARE Performed by: Nance Pear   Total critical care time: 30 minutes  Critical care time was exclusive of separately billable procedures and treating other patients.  Critical care was necessary to treat or prevent imminent or life-threatening deterioration.  Critical care was time spent personally by me on the following activities: development of treatment plan with patient and/or surrogate as well as nursing, discussions with consultants, evaluation of patient's response to treatment,  examination of patient, obtaining history from patient or surrogate, ordering and performing treatments and interventions, ordering and review of laboratory studies, ordering and review of radiographic studies, pulse oximetry and re-evaluation of patient's condition.  ____________________________________________   INITIAL IMPRESSION / ASSESSMENT AND PLAN / ED COURSE  Pertinent labs & imaging results that were available during my care of the patient were reviewed by me and considered in my medical decision making (see chart for details).   Patient presented to the emergency department today because of concerns for shortness of breath that is been ongoing for weeks and an abnormal blood test.  Unclear what blood test was abnormal from her primary care however patient did appear quite pale here.  Did have concerns for anemia given shortness of breath and pallor.  Hemoglobin did return at 4.  Guaiac was minimally positive.  Did discuss with patient transfusion and consented patient for transfusion.  Will plan on admission.  ____________________________________________   FINAL CLINICAL IMPRESSION(S) / ED DIAGNOSES  Final diagnoses:  SOB (shortness of breath)  Anemia, unspecified type     Note: This dictation was prepared with Dragon dictation. Any transcriptional errors that result from this process are unintentional     Nance Pear, MD 03/14/19 1404

## 2019-03-14 NOTE — ED Notes (Signed)
Spoke with [employer] (Camera operator) on [place] about patients bed placement. She reports she will look into patient going to stepdown.

## 2019-03-14 NOTE — ED Notes (Signed)
Patient had episode of urinary incontinence. Patients bed linen changed and patient cleaned

## 2019-03-15 ENCOUNTER — Inpatient Hospital Stay (HOSPITAL_COMMUNITY)
Admit: 2019-03-15 | Discharge: 2019-03-15 | Disposition: A | Discharge status: Home / Self Care | Payer: Self-pay | Attending: Internal Medicine | Admitting: Internal Medicine

## 2019-03-15 DIAGNOSIS — I5031 Acute diastolic (congestive) heart failure: Secondary | ICD-10-CM

## 2019-03-15 LAB — PROCALCITONIN: Procalcitonin: 0.11 ng/mL

## 2019-03-15 LAB — BASIC METABOLIC PANEL
Anion gap: 15 (ref 5–15)
BUN: 7 mg/dL (ref 6–20)
CO2: 25 mmol/L (ref 22–32)
Calcium: 8.2 mg/dL — ABNORMAL LOW (ref 8.9–10.3)
Chloride: 96 mmol/L — ABNORMAL LOW (ref 98–111)
Creatinine, Ser: 0.53 mg/dL (ref 0.44–1.00)
GFR calc Af Amer: >60 mL/min (ref >60)
GFR calc non Af Amer: >60 mL/min (ref >60)
Glucose, Bld: 141 mg/dL — ABNORMAL HIGH (ref 70–99)
Potassium: 3.2 mmol/L — ABNORMAL LOW (ref 3.5–5.1)
Sodium: 136 mmol/L (ref 135–145)

## 2019-03-15 LAB — CBC
HCT: 22.5 % — ABNORMAL LOW (ref 36.0–46.0)
Hemoglobin: 7.7 g/dL — ABNORMAL LOW (ref 12.0–15.0)
MCH: 34.7 pg — ABNORMAL HIGH (ref 26.0–34.0)
MCHC: 34.2 g/dL (ref 30.0–36.0)
MCV: 101.4 fL — ABNORMAL HIGH (ref 80.0–100.0)
Platelets: 268 10*3/uL (ref 150–400)
RBC: 2.22 MIL/uL — ABNORMAL LOW (ref 3.87–5.11)
RDW: 23 % — ABNORMAL HIGH (ref 11.5–15.5)
WBC: 10.1 10*3/uL (ref 4.0–10.5)
nRBC: 0.3 % — ABNORMAL HIGH (ref 0.0–0.2)

## 2019-03-15 LAB — PREPARE RBC (CROSSMATCH)

## 2019-03-15 LAB — HEPATIC FUNCTION PANEL
ALT: 49 U/L — ABNORMAL HIGH (ref 0–44)
AST: 61 U/L — ABNORMAL HIGH (ref 15–41)
Albumin: 3.3 g/dL — ABNORMAL LOW (ref 3.5–5.0)
Alkaline Phosphatase: 67 U/L (ref 38–126)
Bilirubin, Direct: 0.2 mg/dL (ref 0.0–0.2)
Indirect Bilirubin: 1.1 mg/dL — ABNORMAL HIGH (ref 0.3–0.9)
Total Bilirubin: 1.3 mg/dL — ABNORMAL HIGH (ref 0.3–1.2)
Total Protein: 5.7 g/dL — ABNORMAL LOW (ref 6.5–8.1)

## 2019-03-15 LAB — MAGNESIUM: Magnesium: 1.9 mg/dL (ref 1.7–2.4)

## 2019-03-15 LAB — HEMOGLOBIN
Hemoglobin: 6.1 g/dL — ABNORMAL LOW (ref 12.0–15.0)
Hemoglobin: 7.3 g/dL — ABNORMAL LOW (ref 12.0–15.0)
Hemoglobin: 8.4 g/dL — ABNORMAL LOW (ref 12.0–15.0)

## 2019-03-15 LAB — LACTIC ACID, PLASMA
Lactic Acid, Venous: 0.7 mmol/L (ref 0.5–1.9)
Lactic Acid, Venous: 0.8 mmol/L (ref 0.5–1.9)

## 2019-03-15 LAB — FOLATE: Folate: 90 ng/mL (ref >5.9)

## 2019-03-15 LAB — ECHOCARDIOGRAM COMPLETE
Height: 64 in
Weight: 2610.25 oz

## 2019-03-15 MED ORDER — POTASSIUM CHLORIDE CRYS ER 20 MEQ PO TBCR
40.0000 meq | EXTENDED_RELEASE_TABLET | Freq: Once | ORAL | Status: AC
  Administered 2019-03-15: 40 meq via ORAL
  Filled 2019-03-15: qty 2

## 2019-03-15 MED ORDER — POLYETHYLENE GLYCOL 3350 17 GM/SCOOP PO POWD
1.0000 | Freq: Once | ORAL | Status: AC
  Administered 2019-03-15: 255 g via ORAL
  Filled 2019-03-15: qty 255

## 2019-03-15 MED ORDER — FUROSEMIDE 10 MG/ML IJ SOLN
20.0000 mg | Freq: Once | INTRAMUSCULAR | Status: AC
  Administered 2019-03-15: 20 mg via INTRAVENOUS
  Filled 2019-03-15: qty 4

## 2019-03-15 NOTE — Progress Notes (Addendum)
Brief overnight report  Subjective: Patient complaining of wheezing and shortness of breath while receiving second unit of blood.  Denies any other complaint.  Brief History : 43 year old female with known history of DVT, GERD, hypertension and polyneuropathy admitted with symptomatic anemia/GI bleed.  Found to have hemoglobin of 4 status post 1 unit.  Repeat hemoglobin at midnight was 6.1.  Plan: Transfuse second unit of blood which was previously stopped due to complaints of worsening shortness of breath.  Will give Lasix 20 mg IV x1 post transfusion given complaints of shortness of breath while receiving second unit of blood.  Echocardiogram pending.   Rufina Falco, DNP,CCRN, FNP-BC [facility] Nurse Practitioner Between 7am to 6pm - Pager - [phone]  After 6pm go to [email or web address] - Proofreader  [employer]  [phone]

## 2019-03-15 NOTE — Progress Notes (Signed)
*  PRELIMINARY RESULTS* Echocardiogram 2D Echocardiogram has been performed.  Wallie Char Rita Prom 03/15/2019, 9:51 AM

## 2019-03-15 NOTE — Progress Notes (Addendum)
GI Inpatient Follow-up Note  Subjective:  Patient seen in follow-up for symptomatic anemia. She reports overnight she received a second unit of blood and had some wheezing and shortness of breath. She was given IV Lasix post transfusion which helped her feel better. She had her hemoglobin increase up to 8.4 from 4.0 upon presentation to the ED. Hemoglobin this morning was 7.7. She reports she still feels weak and very fatigued. She denies any gross GI bleeding in the form of hematochezia or melena. She has been tolerating clear liquids without any nausea or vomiting. Epigastric abd discomfort is about the same as it was yesterday. She had echo performed this morning with results pending. Her sister is present in the room this afternoon. Patient is eager to go home, but is acceptable of her condition and wants to find answers as to why her hemoglobin dropped.   Scheduled Inpatient Medications:  . folic acid  1 mg Intravenous Daily  . nicotine  21 mg Transdermal Daily  . [START ON 03/18/2019] pantoprazole  40 mg Intravenous Q12H  . sodium chloride flush  10-40 mL Intracatheter Q12H  . thiamine injection  100 mg Intravenous Daily  . vitamin C  500 mg Oral BID    Continuous Inpatient Infusions:   . sodium chloride 75 mL/hr at 03/14/19 2305  . cefTRIAXone (ROCEPHIN)  IV    . pantoprozole (PROTONIX) infusion 8 mg/hr (03/15/19 0031)    PRN Inpatient Medications:  docusate sodium, sodium chloride flush, traZODone  Review of Systems: Constitutional: Weight is stable.  Eyes: No changes in vision. ENT: No oral lesions, sore throat.  GI: see HPI.  Heme/Lymph: No easy bruising.  CV: No chest pain.  GU: No hematuria.  Integumentary: No rashes.  Neuro: No headaches.  Psych: No depression/anxiety.  Endocrine: No heat/cold intolerance.  Allergic/Immunologic: No urticaria.  Resp: No cough, SOB.  Musculoskeletal: No joint swelling.    Physical Examination: BP (!) 136/91 (BP Location: Right Arm)    Pulse 99   Temp 97.8 F (36.6 C) (Oral)   Resp 17   Ht 5\' 4"  (1.626 m)   Wt 74 kg   LMP 08/28/2017   SpO2 94%   BMI 28.00 kg/m  Gen: NAD, alert and oriented x 4 HEENT: PEERLA, EOMI, Neck: supple, no JVD or thyromegaly Chest: CTA bilaterally, no wheezes, crackles, or other adventitious sounds CV: RRR, no m/g/c/r Abd: soft, NT, ND, +BS in all four quadrants; no HSM, guarding, ridigity, or rebound tenderness Ext: no edema, well perfused with 2+ pulses, Skin: no rash or lesions noted Lymph: no LAD  Data: Lab Results  Component Value Date   WBC 10.1 03/15/2019   HGB 7.7 (L) 03/15/2019   HCT 22.5 (L) 03/15/2019   MCV 101.4 (H) 03/15/2019   PLT 268 03/15/2019   Recent Labs  Lab 03/15/19 0017 03/15/19 0500 03/15/19 0841  HGB 6.1* 8.4* 7.7*   Lab Results  Component Value Date   NA 136 03/15/2019   K 3.2 (L) 03/15/2019   CL 96 (L) 03/15/2019   CO2 25 03/15/2019   BUN 7 03/15/2019   CREATININE 0.53 03/15/2019   Lab Results  Component Value Date   ALT 49 (H) 03/15/2019   AST 61 (H) 03/15/2019   ALKPHOS 67 03/15/2019   BILITOT 1.3 (H) 03/15/2019   No results for input(s): APTT, INR, PTT in the last 168 hours. Assessment/Plan:  43 y/o Caucasian female with a PMH of Hx of DVT, GERD, HTN, and polyneuropathy admitted to  hospital for symptomatic anemia  1. Severe symptomatic anemia, macrocytic 2. Epigastric abd pain 3. Overuse of NSAIDs - daily BC powders 4. Change in bowel habits x 3 months  -She is s/p 2 units of pRBCs. Hemoglobin up to 7.7 currently with no signs of gross GI bleeding. -Continue to monitor H&H. Transfuse as necessary to keep Hgb >7.0.  -Continue acid suppression  -I ordered Vitamin B12, folate, and thyroid labs, but only the folate has resulted and is normal -Advise EGD and colonoscopy tomorrow with Dr. Alice Reichert -Clear liquids today. Patient will complete bowel prep starting 1700 today. -NPO after midnight -Further recommendations after  procedures -If EGD and colon are negative, consider small bowel capsule endoscopy  I reviewed the risks (including bleeding, perforation, infection, anesthesia complications, cardiac/respiratory complications), benefits and alternatives of EGD and colonoscopy. Patient consents to proceed.    Please call with questions or concerns.    Octavia Bruckner, PA-C [facility] [phone] (Cell)

## 2019-03-15 NOTE — Plan of Care (Signed)
Patient complained of SOB after getting a unit of PRBC. NP notified and she ordered Lasix IV. Lasix administered, patient placed on 2L O2, voiding and patient states she is breathing better. Will continue to monitor patient.

## 2019-03-15 NOTE — Consult Note (Addendum)
PHARMACY CONSULT NOTE - FOLLOW UP  Pharmacy Consult for Electrolyte Monitoring and Replacement   Recent Labs: Potassium (mmol/L)  Date Value  03/15/2019 3.2 (L)   Magnesium (mg/dL)  Date Value  03/15/2019 1.9   Calcium (mg/dL)  Date Value  03/15/2019 8.2 (L)   Albumin (g/dL)  Date Value  03/15/2019 3.3 (L)   Sodium (mmol/L)  Date Value  03/15/2019 136     Assessment: 43 y.o. female with a known history of DVT, gastroesophageal reflux disease, hypertension, polyneuropathy-is feeling increasingly weak which is progressively getting worse found with a HGB of 4 and symptomatic anemia.  Pharmacy has been consulted to monitor/replenish electrolytes.  Goal of Therapy:  Electrolytes wnl's  Plan:  Patient is receiving KCl po 31meq x 1.  Will recheck electrolytes with am labs.  Lu Duffel, PharmD, BCPS Clinical Pharmacist 03/15/2019 10:20 AM

## 2019-03-15 NOTE — Progress Notes (Signed)
Joanna Garcia at Joanna Garcia NAME: Joanna Garcia    MR#:  [id number]  DATE OF BIRTH:  [birth year]  SUBJECTIVE:  CHIEF COMPLAINT: Patient is reporting nausea with burning micturition lower abdominal pain, back pain denies any vomiting.  Tolerating diet.  Her LMP August 2019.  Noticed dark stool but denies any blood in her stool Stool for occult blood was positive reporting exertional dyspnea with minimal ambulation  REVIEW OF SYSTEMS:  CONSTITUTIONAL: No fever, fatigue or weakness.  EYES: No blurred or double vision.  EARS, NOSE, AND THROAT: No tinnitus or ear pain.  RESPIRATORY: No cough, shortness of breath, wheezing or hemoptysis.  CARDIOVASCULAR: No chest pain, orthopnea, edema.  GASTROINTESTINAL: No nausea, vomiting, diarrhea or abdominal pain.  GENITOURINARY: No dysuria, hematuria.  ENDOCRINE: No polyuria, nocturia,  HEMATOLOGY: No anemia, easy bruising or bleeding SKIN: No rash or lesion. MUSCULOSKELETAL: No joint pain or arthritis.   NEUROLOGIC: No tingling, numbness, weakness.  PSYCHIATRY: No anxiety or depression.   DRUG ALLERGIES:  No Known Allergies  VITALS:  Blood pressure 129/66, pulse 98, temperature 98.2 F (36.8 C), temperature source Oral, resp. rate 20, height 5' 4"  (1.626 m), weight 74 kg, last menstrual period 08/28/2017, SpO2 93 %.  PHYSICAL EXAMINATION:  GENERAL:  43 y.o.-year-old patient lying in the bed with no acute distress.  EYES: Pupils equal, round, reactive to light and accommodation. No scleral icterus. Extraocular muscles intact.  HEENT: Head atraumatic, normocephalic. Oropharynx and nasopharynx clear.  NECK:  Supple, no jugular venous distention. No thyroid enlargement, no tenderness.  LUNGS: Normal breath sounds bilaterally, no wheezing, rales,rhonchi or crepitation. No use of accessory muscles of respiration.  CARDIOVASCULAR: S1, S2 normal. No murmurs, rubs, or gallops.  ABDOMEN: Soft, nontender,  nondistended. Bowel sounds present EXTREMITIES: No pedal edema, cyanosis, or clubbing.  NEUROLOGIC: Cranial nerves II through XII are intact. Muscle strength 5/5 in all extremities. Sensation intact. Gait not checked.  PSYCHIATRIC: The patient is alert and oriented x 3.  SKIN: No obvious rash, lesion, or ulcer.    LABORATORY PANEL:   CBC Recent Labs  Lab 03/15/19 0841  WBC 10.1  HGB 7.7*  HCT 22.5*  PLT 268   ------------------------------------------------------------------------------------------------------------------  Chemistries  Recent Labs  Lab 03/15/19 0017 03/15/19 0841  NA  --  136  K  --  3.2*  CL  --  96*  CO2  --  25  GLUCOSE  --  141*  BUN  --  7  CREATININE  --  0.53  CALCIUM  --  8.2*  MG  --  1.9  AST 61*  --   ALT 49*  --   ALKPHOS 67  --   BILITOT 1.3*  --    ------------------------------------------------------------------------------------------------------------------  Cardiac Enzymes No results for input(s): TROPONINI in the last 168 hours. ------------------------------------------------------------------------------------------------------------------  RADIOLOGY:  Dg Chest Portable 1 View  Result Date: 03/14/2019 CLINICAL DATA:  c/o of SHOB and abnormal labs. EXAM: PORTABLE CHEST 1 VIEW COMPARISON:  Chest radiograph 03/14/2019, 03/03/2010 FINDINGS: Stable cardiomediastinal contours. Lungs are mildly hyperinflated. There are again bilateral interstitial opacities most pronounced in the right lower lung. No pneumothorax or large pleural effusion. No acute findings in the visualized skeleton. IMPRESSION: Redemonstrated nonspecific bilateral interstitial markings most prominent in the right lower lung which is nonspecific and could represent atypical infection, atelectasis or asymmetric edema. Electronically Signed   By: Joanna Garcia M.D.   On: 03/14/2019 18:55   Dg Chest Portable 1 View  Result Date: 03/14/2019 CLINICAL DATA:  Shortness  of breath EXAM: PORTABLE CHEST 1 VIEW COMPARISON:  August 31, 2009 FINDINGS: The heart size and mediastinal contours are within normal limits. There is hyperinflation of the upper lung zones with a probable right apical bullae. Mildly increased interstitial markings seen at the right lung base and left upper lung. No large airspace consolidation or pleural effusion. The visualized skeletal structures are unremarkable. IMPRESSION: Nonspecific mildly increased interstitial markings at the right lung base and left upper lung. This could be due to early infectious etiology and/or atelectasis. Electronically Signed   By: Joanna Garcia M.D.   On: 03/14/2019 10:48    EKG:   Orders placed or performed during the hospital encounter of 03/14/19  . EKG 12-Lead  . EKG 12-Lead    ASSESSMENT AND PLAN:  Symptomatic anemia stool for occult blood is positive Received a blood transfusion Stool for guaiac is positive Monitor hemoglobin hematocrit and transfuse as needed Hemoglobin 4.0-5.8 blood transfusion given 8.4-7.7 hemoglobin less than 7 Seen by Joanna Garcia, scheduled for EGD and colonoscopy in a.m. if EGD and capsule are negative consider small bowel capsule endoscopy Elevated MCV check V74 Thiamine and folic acid supplementation  Sepsis from UTI, patient is symptomatic Patient met septic criteria at the time of admission with leukocytosis, temperature 100.3 and tachycardia 0.7-0.8 lactic acid and procalcitonin 0.11 Leukocytosis resolved today Cultures negative so far and urine culture is pending Patient is on IV Rocephin, will continue the same as sensitivities are pending  hydration with IV fluids  Hypertension-holding home medication for now  Amennorhea for 1 year Outpatient follow-up with GYN as recommended for further investigations  Recent history of upper extremity DVT Seen by vascular surgery Joanna Garcia, not on any anticoagulations  Chronic alcoholism Stable not going through  withdrawal Outpatient alcohol Anonymous CIWA  Pulmonary edema from fluid overload Patient has received IV Lasix after blood transfusion Echo ordered   All the records are reviewed and case discussed with Care Management/Social Workerr. Management plans discussed with the patient, family and they are in agreement.  CODE STATUS:  fc   TOTAL TIME TAKING CARE OF THIS PATIENT: 36  minutes.   POSSIBLE D/C IN 1-2 DAYS, DEPENDING ON CLINICAL CONDITION.  Note: This dictation was prepared with Dragon dictation along with smaller phrase technology. Any transcriptional errors that result from this process are unintentional.   Nicholes Mango M.D on 03/15/2019 at 2:41 PM  Between 7am to 6pm - Pager - [phone] After 6pm go to [email or web address] - password [email or web address] [facility]  Office  [phone]  CC: Primary care physician; Letta Median, MD

## 2019-03-15 NOTE — H&P (View-Only) (Signed)
GI Inpatient Follow-up Note  Subjective:  Patient seen in follow-up for symptomatic anemia. She reports overnight she received a second unit of blood and had some wheezing and shortness of breath. She was given IV Lasix post transfusion which helped her feel better. She had her hemoglobin increase up to 8.4 from 4.0 upon presentation to the ED. Hemoglobin this morning was 7.7. She reports she still feels weak and very fatigued. She denies any gross GI bleeding in the form of hematochezia or melena. She has been tolerating clear liquids without any nausea or vomiting. Epigastric abd discomfort is about the same as it was yesterday. She had echo performed this morning with results pending. Her sister is present in the room this afternoon. Patient is eager to go home, but is acceptable of her condition and wants to find answers as to why her hemoglobin dropped.   Scheduled Inpatient Medications:  . folic acid  1 mg Intravenous Daily  . nicotine  21 mg Transdermal Daily  . [START ON 03/18/2019] pantoprazole  40 mg Intravenous Q12H  . sodium chloride flush  10-40 mL Intracatheter Q12H  . thiamine injection  100 mg Intravenous Daily  . vitamin C  500 mg Oral BID    Continuous Inpatient Infusions:   . sodium chloride 75 mL/hr at 03/14/19 2305  . cefTRIAXone (ROCEPHIN)  IV    . pantoprozole (PROTONIX) infusion 8 mg/hr (03/15/19 0031)    PRN Inpatient Medications:  docusate sodium, sodium chloride flush, traZODone  Review of Systems: Constitutional: Weight is stable.  Eyes: No changes in vision. ENT: No oral lesions, sore throat.  GI: see HPI.  Heme/Lymph: No easy bruising.  CV: No chest pain.  GU: No hematuria.  Integumentary: No rashes.  Neuro: No headaches.  Psych: No depression/anxiety.  Endocrine: No heat/cold intolerance.  Allergic/Immunologic: No urticaria.  Resp: No cough, SOB.  Musculoskeletal: No joint swelling.    Physical Examination: BP (!) 136/91 (BP Location: Right Arm)    Pulse 99   Temp 97.8 F (36.6 C) (Oral)   Resp 17   Ht 5\' 4"  (1.626 m)   Wt 74 kg   LMP 08/28/2017   SpO2 94%   BMI 28.00 kg/m  Gen: NAD, alert and oriented x 4 HEENT: PEERLA, EOMI, Neck: supple, no JVD or thyromegaly Chest: CTA bilaterally, no wheezes, crackles, or other adventitious sounds CV: RRR, no m/g/c/r Abd: soft, NT, ND, +BS in all four quadrants; no HSM, guarding, ridigity, or rebound tenderness Ext: no edema, well perfused with 2+ pulses, Skin: no rash or lesions noted Lymph: no LAD  Data: Lab Results  Component Value Date   WBC 10.1 03/15/2019   HGB 7.7 (L) 03/15/2019   HCT 22.5 (L) 03/15/2019   MCV 101.4 (H) 03/15/2019   PLT 268 03/15/2019   Recent Labs  Lab 03/15/19 0017 03/15/19 0500 03/15/19 0841  HGB 6.1* 8.4* 7.7*   Lab Results  Component Value Date   NA 136 03/15/2019   K 3.2 (L) 03/15/2019   CL 96 (L) 03/15/2019   CO2 25 03/15/2019   BUN 7 03/15/2019   CREATININE 0.53 03/15/2019   Lab Results  Component Value Date   ALT 49 (H) 03/15/2019   AST 61 (H) 03/15/2019   ALKPHOS 67 03/15/2019   BILITOT 1.3 (H) 03/15/2019   No results for input(s): APTT, INR, PTT in the last 168 hours. Assessment/Plan:  43 y/o Caucasian female with a PMH of Hx of DVT, GERD, HTN, and polyneuropathy admitted to  hospital for symptomatic anemia  1. Severe symptomatic anemia, macrocytic 2. Epigastric abd pain 3. Overuse of NSAIDs - daily BC powders 4. Change in bowel habits x 3 months  -She is s/p 2 units of pRBCs. Hemoglobin up to 7.7 currently with no signs of gross GI bleeding. -Continue to monitor H&H. Transfuse as necessary to keep Hgb >7.0.  -Continue acid suppression  -I ordered Vitamin B12, folate, and thyroid labs, but only the folate has resulted and is normal -Advise EGD and colonoscopy tomorrow with Dr. Alice Reichert -Clear liquids today. Patient will complete bowel prep starting 1700 today. -NPO after midnight -Further recommendations after  procedures -If EGD and colon are negative, consider small bowel capsule endoscopy  I reviewed the risks (including bleeding, perforation, infection, anesthesia complications, cardiac/respiratory complications), benefits and alternatives of EGD and colonoscopy. Patient consents to proceed.    Please call with questions or concerns.    Octavia Bruckner, PA-C [facility] [phone] (Cell)

## 2019-03-16 ENCOUNTER — Encounter: Payer: Self-pay | Admitting: *Deleted

## 2019-03-16 ENCOUNTER — Encounter
Admission: EM | Disposition: A | Discharge status: Home / Self Care | Payer: Self-pay | Source: Home / Self Care | Attending: Internal Medicine

## 2019-03-16 ENCOUNTER — Inpatient Hospital Stay: Payer: Self-pay | Admitting: Certified Registered"

## 2019-03-16 HISTORY — PX: ESOPHAGOGASTRODUODENOSCOPY: SHX5428

## 2019-03-16 HISTORY — PX: COLONOSCOPY: SHX5424

## 2019-03-16 LAB — URINE CULTURE: Culture: >=100000 — AB

## 2019-03-16 LAB — CBC
HCT: 21.7 % — ABNORMAL LOW (ref 36.0–46.0)
Hemoglobin: 7.3 g/dL — ABNORMAL LOW (ref 12.0–15.0)
MCH: 34.4 pg — ABNORMAL HIGH (ref 26.0–34.0)
MCHC: 33.6 g/dL (ref 30.0–36.0)
MCV: 102.4 fL — ABNORMAL HIGH (ref 80.0–100.0)
Platelets: 232 10*3/uL (ref 150–400)
RBC: 2.12 MIL/uL — ABNORMAL LOW (ref 3.87–5.11)
RDW: 22.5 % — ABNORMAL HIGH (ref 11.5–15.5)
WBC: 7.4 10*3/uL (ref 4.0–10.5)
nRBC: 0.3 % — ABNORMAL HIGH (ref 0.0–0.2)

## 2019-03-16 LAB — THYROID PANEL WITH TSH
Free Thyroxine Index: 1.4 (ref 1.2–4.9)
T3 Uptake Ratio: 26 % (ref 24–39)
T4, Total: 5.5 ug/dL (ref 4.5–12.0)
TSH: 4.13 u[IU]/mL (ref 0.450–4.500)

## 2019-03-16 LAB — BASIC METABOLIC PANEL
Anion gap: 7 (ref 5–15)
BUN: 5 mg/dL — ABNORMAL LOW (ref 6–20)
CO2: 24 mmol/L (ref 22–32)
Calcium: 8.1 mg/dL — ABNORMAL LOW (ref 8.9–10.3)
Chloride: 106 mmol/L (ref 98–111)
Creatinine, Ser: 0.37 mg/dL — ABNORMAL LOW (ref 0.44–1.00)
GFR calc Af Amer: >60 mL/min (ref >60)
GFR calc non Af Amer: >60 mL/min (ref >60)
Glucose, Bld: 90 mg/dL (ref 70–99)
Potassium: 3.1 mmol/L — ABNORMAL LOW (ref 3.5–5.1)
Sodium: 137 mmol/L (ref 135–145)

## 2019-03-16 LAB — PREPARE RBC (CROSSMATCH)

## 2019-03-16 LAB — PROCALCITONIN: Procalcitonin: <0.1 ng/mL

## 2019-03-16 LAB — HIV ANTIBODY (ROUTINE TESTING W REFLEX): HIV Screen 4th Generation wRfx: NONREACTIVE

## 2019-03-16 SURGERY — EGD (ESOPHAGOGASTRODUODENOSCOPY)
Anesthesia: General

## 2019-03-16 MED ORDER — LIDOCAINE HCL (CARDIAC) PF 100 MG/5ML IV SOSY
PREFILLED_SYRINGE | INTRAVENOUS | Status: DC | PRN
  Administered 2019-03-16: 100 mg via INTRATRACHEAL

## 2019-03-16 MED ORDER — PROPOFOL 500 MG/50ML IV EMUL
INTRAVENOUS | Status: AC
  Filled 2019-03-16: qty 200

## 2019-03-16 MED ORDER — GLYCOPYRROLATE 0.2 MG/ML IJ SOLN
INTRAMUSCULAR | Status: AC
  Filled 2019-03-16: qty 1

## 2019-03-16 MED ORDER — NICOTINE 21 MG/24HR TD PT24: 21.0000 mg | MEDICATED_PATCH | Freq: Every day | TRANSDERMAL | 0 refills | Status: DC

## 2019-03-16 MED ORDER — CEPHALEXIN 500 MG PO CAPS: 500.0000 mg | ORAL_CAPSULE | Freq: Two times a day (BID) | ORAL | 0 refills | Status: DC

## 2019-03-16 MED ORDER — PROPOFOL 10 MG/ML IV BOLUS
INTRAVENOUS | Status: DC | PRN
  Administered 2019-03-16: 50 mg via INTRAVENOUS
  Administered 2019-03-16 (×5): 20 mg via INTRAVENOUS

## 2019-03-16 MED ORDER — DOCUSATE SODIUM 100 MG PO CAPS: 100.0000 mg | ORAL_CAPSULE | Freq: Two times a day (BID) | ORAL | 0 refills | Status: DC | PRN

## 2019-03-16 MED ORDER — FERROUS SULFATE 325 (65 FE) MG PO TABS: 325.0000 mg | ORAL_TABLET | Freq: Two times a day (BID) | ORAL | 3 refills | Status: DC

## 2019-03-16 MED ORDER — PANTOPRAZOLE SODIUM 40 MG PO TBEC: 40.0000 mg | DELAYED_RELEASE_TABLET | Freq: Every day | ORAL | Status: DC

## 2019-03-16 MED ORDER — FERROUS SULFATE 325 (65 FE) MG PO TABS: 325.0000 mg | ORAL_TABLET | Freq: Two times a day (BID) | ORAL | Status: DC

## 2019-03-16 MED ORDER — PROPOFOL 500 MG/50ML IV EMUL
INTRAVENOUS | Status: AC
  Filled 2019-03-16: qty 50

## 2019-03-16 MED ORDER — POTASSIUM CHLORIDE CRYS ER 20 MEQ PO TBCR
40.0000 meq | EXTENDED_RELEASE_TABLET | Freq: Once | ORAL | Status: AC
  Administered 2019-03-16: 40 meq via ORAL
  Filled 2019-03-16: qty 2

## 2019-03-16 MED ORDER — FUROSEMIDE 10 MG/ML IJ SOLN
20.0000 mg | Freq: Once | INTRAMUSCULAR | Status: AC
  Administered 2019-03-16: 16:00:00 20 mg via INTRAVENOUS
  Filled 2019-03-16: qty 4

## 2019-03-16 MED ORDER — GLYCOPYRROLATE 0.2 MG/ML IJ SOLN
INTRAMUSCULAR | Status: DC | PRN
  Administered 2019-03-16: 0.2 mg via INTRAVENOUS

## 2019-03-16 MED ORDER — ASCORBIC ACID 500 MG PO TABS: 500.0000 mg | ORAL_TABLET | Freq: Two times a day (BID) | ORAL | Status: DC

## 2019-03-16 MED ORDER — OMEPRAZOLE 40 MG PO CPDR: 40.0000 mg | DELAYED_RELEASE_CAPSULE | Freq: Every day | ORAL | 0 refills | Status: DC

## 2019-03-16 MED ORDER — LIDOCAINE HCL (PF) 2 % IJ SOLN
INTRAMUSCULAR | Status: AC
  Filled 2019-03-16: qty 40

## 2019-03-16 MED ORDER — LIDOCAINE HCL (PF) 2 % IJ SOLN
INTRAMUSCULAR | Status: AC
  Filled 2019-03-16: qty 20

## 2019-03-16 MED ORDER — LISINOPRIL 2.5 MG PO TABS: 2.5000 mg | ORAL_TABLET | Freq: Every day | ORAL | 0 refills | Status: DC

## 2019-03-16 MED ORDER — CEPHALEXIN 500 MG PO CAPS
500.0000 mg | ORAL_CAPSULE | Freq: Two times a day (BID) | ORAL | Status: DC
  Administered 2019-03-16: 500 mg via ORAL
  Filled 2019-03-16: qty 1

## 2019-03-16 MED ORDER — PHENYLEPHRINE HCL (PRESSORS) 10 MG/ML IV SOLN
INTRAVENOUS | Status: DC | PRN
  Administered 2019-03-16: 100 ug via INTRAVENOUS

## 2019-03-16 MED ORDER — POTASSIUM CHLORIDE 10 MEQ/100ML IV SOLN
10.0000 meq | INTRAVENOUS | Status: DC
  Administered 2019-03-16: 11:00:00 10 meq via INTRAVENOUS
  Filled 2019-03-16 (×5): qty 100

## 2019-03-16 MED ORDER — ONDANSETRON HCL 4 MG/2ML IJ SOLN
INTRAMUSCULAR | Status: AC
  Administered 2019-03-16: 4 mg
  Filled 2019-03-16: qty 2

## 2019-03-16 MED ORDER — PROPOFOL 10 MG/ML IV BOLUS
INTRAVENOUS | Status: AC
  Filled 2019-03-16: qty 20

## 2019-03-16 MED ORDER — SODIUM CHLORIDE 0.9% IV SOLUTION: Freq: Once | INTRAVENOUS | Status: DC

## 2019-03-16 NOTE — Discharge Instructions (Signed)
Follow-up with primary care physician in 3 to 4 days Follow-up with gastroenterology Dr. Alice Reichert in a week for outpatient capsule study Follow-up with gynecology in 1 to 2 weeks

## 2019-03-16 NOTE — Discharge Summary (Signed)
[facility] [place] NAME: Joanna Garcia    MR#:  [id number]  DATE OF BIRTH:  1976/06/13  DATE OF ADMISSION:  03/14/2019 ADMITTING PHYSICIAN: Vaughan Basta, MD  DATE OF DISCHARGE:  03/16/19   PRIMARY CARE PHYSICIAN: Letta Median, MD    ADMISSION DIAGNOSIS:  SOB (shortness of breath) [R06.02] Anemia, unspecified type [D64.9]  DISCHARGE DIAGNOSIS:  Principal Problem:   Symptomatic anemia Active Problems:   GI bleed Gastritis and nonbleeding internal hemorrhoids  SECONDARY DIAGNOSIS:   Past Medical History:  Diagnosis Date  . DVT (deep venous thrombosis) ([place])   . GERD (gastroesophageal reflux disease)   . Hypertension   . Polyneuropathy     HOSPITAL COURSE:  HISTORY OF PRESENT ILLNESS: Joanna Garcia  is a 43 y.o. female with a known history of DVT, gastroesophageal reflux disease, hypertension, polyneuropathy-is feeling increasingly weak which is progressively getting worse to the point now she feels dizzy when trying to stand up and feels extremely short of breath and tired on minimal exertion of walking in house.  Concern with this she came to emergency room. She denies any fever chills.  She denies any abdominal pain vomiting or diarrhea.  She denies noticing any dark stools or bleeding. In ER her hemoglobin was noted at 4.  Patient denies any over-the-counter NSAIDs use.  Her stool guaiac was positive by ER physician.  He ordered 2 units of blood transfusion and advised to admit to hospitalist service Patient urinalysis was also noted to be positive for UTI so started on antibiotics by ER physician already.  Patient was feeling somewhat short of breath and was requiring supplemental oxygen in emergency room.  Chest x-ray reported some pulmonary edema.  Symptomatic anemia stool for occult blood is positive Received a blood transfusion Stool for guaiac is positive Monitor hemoglobin hematocrit and transfuse as  needed Hemoglobin 4.0-5.8 blood transfusion given 8.4-7.7 hemoglobin less than 7 Seen by Dr. Alice Reichert, scheduled for EGD and colonoscopy performed on 03/16/2019 EGD has revealed grade a reflux esophagitis and colonoscopy with non-bleeding internal hemorrhoids.  Okay to discharge patient from GI standpoint outpatient capsule endoscopy is recommended Discontinue IV Protonix.  Change to p.o. Protonix once daily Avoid NSAIDs strictly elevated MCV- check I95 Thiamine and folic acid supplementation Hemoglobin of 7.3 will transfuse 1 unit of blood and will give IV Lasix prior to transfusion and dc after PRBC ,NO need to check posttransfusion hemoglobin   Sepsis from UTI, patient is symptomatic Patient met septic criteria at the time of admission with leukocytosis, temperature 100.3 and tachycardia 0.7-0.8 lactic acid and procalcitonin 0.11 Leukocytosis resolved today Cultures negative so far and urine culture is pending Patient is on IV Rocephin,  urine culture with E. coli pansensitive DC'd IV Rocephin and change antibiotic to p.o. Keflex 500 mg twice a day   Hypertension-blood pressure is soft lisinopril 20 mg home dose changed to 2.5 mg, PCP to monitor and titrate as needed  [place] for 1 year Outpatient follow-up with GYN as recommended for further investigations  Recent history of upper extremity DVT Seen by vascular surgery Dr. Delana Meyer, not on any anticoagulations  Chronic alcoholism Stable not going through withdrawal Outpatient alcohol Anonymous CIWA,   Pulmonary edema from fluid overload Patient has received IV Lasix after blood transfusion Echo -60 to 65% ejection fraction left ventricle has normal function DISCHARGE CONDITIONS:   Stable  CONSULTS OBTAINED:  Treatment Team:  Efrain Sella, MD   PROCEDURES EGD  and colonoscopy on 03/16/2019  DRUG ALLERGIES:  No Known Allergies  DISCHARGE MEDICATIONS:   Allergies as of 03/16/2019   No Known Allergies      Medication List    STOP taking these medications   gabapentin 300 MG capsule Commonly known as: Neurontin   naproxen 500 MG tablet Commonly known as: Naprosyn     TAKE these medications   ascorbic acid 500 MG tablet Commonly known as: VITAMIN C Take 1 tablet (500 mg total) by mouth 2 (two) times daily.   cephALEXin 500 MG capsule Commonly known as: KEFLEX Take 1 capsule (500 mg total) by mouth every 12 (twelve) hours.   docusate sodium 100 MG capsule Commonly known as: COLACE Take 1 capsule (100 mg total) by mouth 2 (two) times daily as needed for mild constipation.   ferrous sulfate 325 (65 FE) MG tablet Take 1 tablet (325 mg total) by mouth 2 (two) times daily with a meal. Start taking on: March 17, 2019   lisinopril 2.5 MG tablet Commonly known as: ZESTRIL Take 1 tablet (2.5 mg total) by mouth daily. What changed:   medication strength  how much to take   nicotine 21 mg/24hr patch Commonly known as: NICODERM CQ - dosed in mg/24 hours Place 1 patch (21 mg total) onto the skin daily. Start taking on: March 17, 2019   omeprazole 40 MG capsule Commonly known as: PRILOSEC Take 1 capsule (40 mg total) by mouth daily. What changed:   medication strength  how much to take        DISCHARGE INSTRUCTIONS:   Follow-up with primary care physician in 3 to 4 days Follow-up with gastroenterology Dr. Alice Reichert in a week for outpatient capsule study Follow-up with gynecology in 1 to 2 weeks Outpatient alcohol Anonymous  DIET:  Cardiac diet  DISCHARGE CONDITION:  Fair  ACTIVITY:  Activity as tolerated  OXYGEN:  Home Oxygen: No.   Oxygen Delivery: room air  DISCHARGE LOCATION:  home   If you experience worsening of your admission symptoms, develop shortness of breath, life threatening emergency, suicidal or homicidal thoughts you must seek medical attention immediately by calling 911 or calling your MD immediately  if symptoms less severe.  You  Must read complete instructions/literature along with all the possible adverse reactions/side effects for all the Medicines you take and that have been prescribed to you. Take any new Medicines after you have completely understood and accpet all the possible adverse reactions/side effects.   Please note  You were cared for by a hospitalist during your hospital stay. If you have any questions about your discharge medications or the care you received while you were in the hospital after you are discharged, you can call the unit and asked to speak with the hospitalist on call if the hospitalist that took care of you is not available. Once you are discharged, your primary care physician will handle any further medical issues. Please note that NO REFILLS for any discharge medications will be authorized once you are discharged, as it is imperative that you return to your primary care physician (or establish a relationship with a primary care physician if you do not have one) for your aftercare needs so that they can reassess your need for medications and monitor your lab values.     Today  Chief Complaint  Patient presents with  . Shortness of Breath   Patient is feeling fine denies any  chest pain or any other episodes of bleeding Hemoglobin 7.3  agreeable with 1 unit of blood transfusions after discharge ROS:  CONSTITUTIONAL: Denies fevers, chills. Denies any fatigue, weakness.  EYES: Denies blurry vision, double vision, eye pain. EARS, NOSE, THROAT: Denies tinnitus, ear pain, hearing loss. RESPIRATORY: Denies cough, wheeze, shortness of breath.  CARDIOVASCULAR: Denies chest pain, palpitations, edema.  GASTROINTESTINAL: Denies nausea, vomiting, diarrhea, abdominal pain. Denies bright red blood per rectum. GENITOURINARY: Denies dysuria, hematuria. ENDOCRINE: Denies nocturia or thyroid problems. HEMATOLOGIC AND LYMPHATIC: Denies easy bruising or bleeding. SKIN: Denies rash or  lesion. MUSCULOSKELETAL: Denies pain in neck, back, shoulder, knees, hips or arthritic symptoms.  NEUROLOGIC: Denies paralysis, paresthesias.  PSYCHIATRIC: Denies anxiety or depressive symptoms.   VITAL SIGNS:  Blood pressure 120/71, pulse 95, temperature 98.4 F (36.9 C), temperature source Oral, resp. rate 20, height 5' 4"  (1.626 m), weight 72.6 kg, last menstrual period 08/28/2017, SpO2 95 %.  I/O:    Intake/Output Summary (Last 24 hours) at 03/16/2019 1402 Last data filed at 03/16/2019 0203 Gross per 24 hour  Intake 2390.29 ml  Output -  Net 2390.29 ml    PHYSICAL EXAMINATION:  GENERAL:  43 y.o.-year-old patient lying in the bed with no acute distress.  EYES: Pupils equal, round, reactive to light and accommodation. No scleral icterus. Extraocular muscles intact.  HEENT: Head atraumatic, normocephalic. Oropharynx and nasopharynx clear.  NECK:  Supple, no jugular venous distention. No thyroid enlargement, no tenderness.  LUNGS: Normal breath sounds bilaterally, no wheezing, rales,rhonchi or crepitation. No use of accessory muscles of respiration.  CARDIOVASCULAR: S1, S2 normal. No murmurs, rubs, or gallops.  ABDOMEN: Soft, non-tender, non-distended. Bowel sounds present.  EXTREMITIES: No pedal edema, cyanosis, or clubbing.  NEUROLOGIC: Cranial nerves II through XII are intact. Muscle strength 5/5 in all extremities. Sensation intact. Gait not checked.  PSYCHIATRIC: The patient is alert and oriented x 3.  SKIN: No obvious rash, lesion, or ulcer.   DATA REVIEW:   CBC Recent Labs  Lab 03/16/19 0511  WBC 7.4  HGB 7.3*  HCT 21.7*  PLT 232    Chemistries  Recent Labs  Lab 03/15/19 0017 03/15/19 0841 03/16/19 0511  NA  --  136 137  K  --  3.2* 3.1*  CL  --  96* 106  CO2  --  25 24  GLUCOSE  --  141* 90  BUN  --  7 5*  CREATININE  --  0.53 0.37*  CALCIUM  --  8.2* 8.1*  MG  --  1.9  --   AST 61*  --   --   ALT 49*  --   --   ALKPHOS 67  --   --   BILITOT 1.3*   --   --     Cardiac Enzymes No results for input(s): TROPONINI in the last 168 hours.  Microbiology Results  Results for orders placed or performed during the hospital encounter of 03/14/19  SARS Coronavirus 2 [facility] order, Performed in [facility] hospital lab) Nasopharyngeal Nasopharyngeal Swab     Status: None   Collection Time: 03/14/19  1:31 PM   Specimen: Nasopharyngeal Swab  Result Value Ref Range Status   SARS Coronavirus 2 NEGATIVE NEGATIVE Final    Comment: (NOTE) If result is NEGATIVE SARS-CoV-2 target nucleic acids are NOT DETECTED. The SARS-CoV-2 RNA is generally detectable in upper and lower  respiratory specimens during the acute phase of infection. The lowest  concentration of SARS-CoV-2 viral copies this assay can detect is 250  copies / mL. A negative result does  not preclude SARS-CoV-2 infection  and should not be used as the sole basis for treatment or other  patient management decisions.  A negative result may occur with  improper specimen collection / handling, submission of specimen other  than nasopharyngeal swab, presence of viral mutation(s) within the  areas targeted by this assay, and inadequate number of viral copies  (<250 copies / mL). A negative result must be combined with clinical  observations, patient history, and epidemiological information. If result is POSITIVE SARS-CoV-2 target nucleic acids are DETECTED. The SARS-CoV-2 RNA is generally detectable in upper and lower  respiratory specimens dur ing the acute phase of infection.  Positive  results are indicative of active infection with SARS-CoV-2.  Clinical  correlation with patient history and other diagnostic information is  necessary to determine patient infection status.  Positive results do  not rule out bacterial infection or co-infection with other viruses. If result is PRESUMPTIVE POSTIVE SARS-CoV-2 nucleic acids MAY BE PRESENT.   A presumptive positive result was obtained on the  submitted specimen  and confirmed on repeat testing.  While 2019 novel coronavirus  (SARS-CoV-2) nucleic acids may be present in the submitted sample  additional confirmatory testing may be necessary for epidemiological  and / or clinical management purposes  to differentiate between  SARS-CoV-2 and other Sarbecovirus currently known to infect humans.  If clinically indicated additional testing with an alternate test  methodology [phone]) is advised. The SARS-CoV-2 RNA is generally  detectable in upper and lower respiratory sp ecimens during the acute  phase of infection. The expected result is Negative. Fact Sheet for Patients:  StrictlyIdeas.no Fact Sheet for Healthcare Providers: BankingDealers.co.za This test is not yet approved or cleared by the [place] FDA and has been authorized for detection and/or diagnosis of SARS-CoV-2 by FDA under an Emergency Use Authorization (EUA).  This EUA will remain in effect (meaning this test can be used) for the duration of the COVID-19 declaration under Section 564(b)(1) of the Act, 21 U.S.C. section 360bbb-3(b)(1), unless the authorization is terminated or revoked sooner. Performed at [facility], [address] [place], [place] [address]   Urine Culture     Status: Abnormal   Collection Time: 03/14/19  2:13 PM   Specimen: Urine, Random  Result Value Ref Range Status   Specimen Description   Final    URINE, RANDOM Performed at [facility], [address] [place], [place] [address]    Special Requests   Final    NONE Performed at [facility], [place], [place] [address]    Culture >=100,000 COLONIES/mL ESCHERICHIA COLI (A)  Final   Report Status 03/16/2019 FINAL  Final   Organism ID, Bacteria ESCHERICHIA COLI (A)  Final      Susceptibility   Escherichia coli - MIC*    AMPICILLIN <=2 SENSITIVE Sensitive     CEFAZOLIN <=4 SENSITIVE  Sensitive     CEFTRIAXONE <=1 SENSITIVE Sensitive     CIPROFLOXACIN <=0.25 SENSITIVE Sensitive     GENTAMICIN <=1 SENSITIVE Sensitive     IMIPENEM <=0.25 SENSITIVE Sensitive     NITROFURANTOIN <=16 SENSITIVE Sensitive     TRIMETH/SULFA <=20 SENSITIVE Sensitive     AMPICILLIN/SULBACTAM <=2 SENSITIVE Sensitive     PIP/TAZO <=4 SENSITIVE Sensitive     Extended ESBL NEGATIVE Sensitive     * >=100,000 COLONIES/mL ESCHERICHIA COLI  Blood culture (routine x 2)     Status: None (Preliminary result)   Collection Time: 03/14/19  4:08 PM   Specimen: BLOOD  Result Value Ref Range Status   Specimen Description BLOOD BLOOD LEFT HAND  Final   Special Requests   Final    BOTTLES DRAWN AEROBIC AND ANAEROBIC Blood Culture adequate volume   Culture   Final    NO GROWTH 2 DAYS Performed at [facility], [address], [place], [place] [address]    Report Status PENDING  Incomplete  Blood culture (routine x 2)     Status: None (Preliminary result)   Collection Time: 03/14/19  4:09 PM   Specimen: BLOOD  Result Value Ref Range Status   Specimen Description BLOOD BLOOD RIGHT HAND  Final   Special Requests   Final    BOTTLES DRAWN AEROBIC AND ANAEROBIC Blood Culture adequate volume   Culture   Final    NO GROWTH 2 DAYS Performed at [facility], [address], [place], [place] [address]    Report Status PENDING  Incomplete    RADIOLOGY:  Dg Chest Portable 1 View  Result Date: 03/14/2019 CLINICAL DATA:  c/o of SHOB and abnormal labs. EXAM: PORTABLE CHEST 1 VIEW COMPARISON:  Chest radiograph 03/14/2019, 03/03/2010 FINDINGS: Stable cardiomediastinal contours. Lungs are mildly hyperinflated. There are again bilateral interstitial opacities most pronounced in the right lower lung. No pneumothorax or large pleural effusion. No acute findings in the visualized skeleton. IMPRESSION: Redemonstrated nonspecific bilateral interstitial markings most prominent in the right lower lung which is  nonspecific and could represent atypical infection, atelectasis or asymmetric edema. Electronically Signed   By: Audie Pinto M.D.   On: 03/14/2019 18:55   Dg Chest Portable 1 View  Result Date: 03/14/2019 CLINICAL DATA:  Shortness of breath EXAM: PORTABLE CHEST 1 VIEW COMPARISON:  August 31, 2009 FINDINGS: The heart size and mediastinal contours are within normal limits. There is hyperinflation of the upper lung zones with a probable right apical bullae. Mildly increased interstitial markings seen at the right lung base and left upper lung. No large airspace consolidation or pleural effusion. The visualized skeletal structures are unremarkable. IMPRESSION: Nonspecific mildly increased interstitial markings at the right lung base and left upper lung. This could be due to early infectious etiology and/or atelectasis. Electronically Signed   By: Prudencio Pair M.D.   On: 03/14/2019 10:48    EKG:   Orders placed or performed during the hospital encounter of 03/14/19  . EKG 12-Lead  . EKG 12-Lead      Management plans discussed with the patient, family and they are in agreement.  CODE STATUS:     Code Status Orders  (From admission, onward)         Start     Ordered   03/14/19 2153  Full code  Continuous     03/14/19 2152        Code Status History    This patient has a current code status but no historical code status.   Advance Care Planning Activity      TOTAL TIME TAKING CARE OF THIS PATIENT: 43  minutes.   Note: This dictation was prepared with Dragon dictation along with smaller phrase technology. Any transcriptional errors that result from this process are unintentional.   @[facility] @  on 03/16/2019 at 2:02 PM  Between 7am to 6pm - Pager - [phone]  After 6pm go to [email or web address] - password [email or web address] [place] [facility]  Office  [phone]  CC: Primary care physician; Letta Median, MD

## 2019-03-16 NOTE — Plan of Care (Signed)

## 2019-03-16 NOTE — OR Nursing (Signed)
Pt c/o nausea with small amount of green tinted mucous noted in basin.  Zofran 4 mg IVP given per Dr. Kathryne Sharper order.

## 2019-03-16 NOTE — Anesthesia Post-op Follow-up Note (Signed)
Anesthesia QCDR form completed.        

## 2019-03-16 NOTE — Op Note (Signed)
[facility] Gastroenterology Patient Name: Joanna Garcia Procedure Date: 03/16/2019 7:58 AM MRN: [id number] Account #: [id number] Date of Birth: 02/19/1976 Admit Type: Inpatient Age: 43 Room: [facility] ENDO ROOM 3 Gender: Female Note Status: Finalized Procedure:            Colonoscopy Indications:          Heme positive stool Providers:            Benay Pike. Alice Reichert MD, MD Referring MD:         Baxter Kail. Rebeca Alert MD, MD (Referring MD) Medicines:            Propofol per Anesthesia Complications:        No immediate complications. Procedure:            Pre-Anesthesia Assessment:                       - The risks and benefits of the procedure and the                        sedation options and risks were discussed with the                        patient. All questions were answered and informed                        consent was obtained.                       - Patient identification and proposed procedure were                        verified prior to the procedure by the nurse. The                        procedure was verified in the procedure room.                       - ASA Grade Assessment: III - A patient with severe                        systemic disease.                       - After reviewing the risks and benefits, the patient                        was deemed in satisfactory condition to undergo the                        procedure.                       After obtaining informed consent, the colonoscope was                        passed under direct vision. Throughout the procedure,                        the patient's blood pressure, pulse, and oxygen  saturations were monitored continuously. The                        Colonoscope was introduced through the anus and                        advanced to the the cecum, identified by appendiceal                        orifice and ileocecal valve. The colonoscopy was   performed with ease. The patient tolerated the                        procedure well. The quality of the bowel preparation                        was excellent. The ileocecal valve, appendiceal                        orifice, and rectum were photographed. Findings:      The perianal exam findings include internal hemorrhoids that prolapse       with straining, but spontaneously regress to the resting position (Grade       II).      The digital rectal exam was normal. Pertinent negatives include normal       sphincter tone.      A few small-mouthed diverticula were found in the sigmoid colon.      There is no endoscopic evidence of bleeding, erythema, mass, polyps or       ulcerations in the entire colon.      Non-bleeding internal hemorrhoids were found during retroflexion. The       hemorrhoids were Grade I (internal hemorrhoids that do not prolapse).      The exam was otherwise without abnormality. Impression:           - Internal hemorrhoids that prolapse with straining,                        but spontaneously regress to the resting position                        (Grade II) found on perianal exam.                       - Diverticulosis in the sigmoid colon.                       - Non-bleeding internal hemorrhoids.                       - The examination was otherwise normal.                       - No specimens collected. Recommendation:       - Return patient to hospital ward for possible                        discharge same day.                       - To visualize the small bowel, perform video capsule  endoscopy at appointment to be scheduled.                       - Refer to a hematologist at appointment to be                        scheduled.                       - Advance diet as tolerated - advance as tolerated to                        resume regular diet.                       - No aspirin, ibuprofen, naproxen, or other                         non-steroidal anti-inflammatory drugs.                       - Return to my office in 3 weeks.                       - The findings and recommendations were discussed with                        the patient. Procedure Code(s):    --- Professional ---                       [phone], Colonoscopy, flexible; diagnostic, including                        collection of specimen(s) by brushing or washing, when                        performed (separate procedure) Diagnosis Code(s):    --- Professional ---                       K57.30, Diverticulosis of large intestine without                        perforation or abscess without bleeding                       R19.5, Other fecal abnormalities                       K64.1, Second degree hemorrhoids CPT copyright 2019 American Medical Association. All rights reserved. The codes documented in this report are preliminary and upon coder review may  be revised to meet current compliance requirements. Efrain Sella MD, MD 03/16/2019 8:25:23 AM This report has been signed electronically. Number of Addenda: 0 Note Initiated On: 03/16/2019 7:58 AM Scope Withdrawal Time: 0 hours 5 minutes 11 seconds  Total Procedure Duration: 0 hours 7 minutes 49 seconds  Estimated Blood Loss: Estimated blood loss: none.      [facility]

## 2019-03-16 NOTE — Anesthesia Postprocedure Evaluation (Signed)
Anesthesia Post Note  Patient: Joanna Garcia  Procedure(s) Performed: ESOPHAGOGASTRODUODENOSCOPY (EGD) (N/A ) COLONOSCOPY (N/A )  Patient location during evaluation: Endoscopy Anesthesia Type: General Level of consciousness: awake and alert Pain management: pain level controlled Vital Signs Assessment: post-procedure vital signs reviewed and stable Respiratory status: spontaneous breathing and respiratory function stable Cardiovascular status: stable Anesthetic complications: no     Last Vitals:  Vitals:   03/16/19 0659 03/16/19 0826  BP: (!) 144/79 110/71  Pulse: 99 99  Resp: 16 (!) 24  Temp: (!) 36.1 C 36.6 C  SpO2: 98% 100%    Last Pain:  Vitals:   03/16/19 0826  TempSrc: Tympanic  PainSc: 0-No pain                 KEPHART,WILLIAM K

## 2019-03-16 NOTE — Op Note (Signed)
[facility] Gastroenterology Patient Name: Chantil Stables Procedure Date: 03/16/2019 7:59 AM MRN: [id number] Account #: [id number] Date of Birth: [birth year] Admit Type: Inpatient Age: 43 Room: [facility] ENDO ROOM 3 Gender: Female Note Status: Finalized Procedure:            Upper GI endoscopy Indications:          Epigastric abdominal pain, Heme positive stool, Melena Providers:            Benay Pike. Alice Reichert MD, MD Referring MD:         Baxter Kail. Rebeca Alert MD, MD (Referring MD) Medicines:            Propofol per Anesthesia Complications:        No immediate complications. Procedure:            Pre-Anesthesia Assessment:                       - The risks and benefits of the procedure and the                        sedation options and risks were discussed with the                        patient. All questions were answered and informed                        consent was obtained.                       - Patient identification and proposed procedure were                        verified prior to the procedure by the nurse. The                        procedure was verified in the procedure room.                       - ASA Grade Assessment: III - A patient with severe                        systemic disease.                       - After reviewing the risks and benefits, the patient                        was deemed in satisfactory condition to undergo the                        procedure.                       After obtaining informed consent, the endoscope was                        passed under direct vision. Throughout the procedure,                        the patient's blood pressure, pulse, and oxygen  saturations were monitored continuously. The Endoscope                        was introduced through the mouth, and advanced to the                        third part of duodenum. The upper GI endoscopy was                        accomplished without  difficulty. The patient tolerated                        the procedure well. Findings:      LA Grade A (one or more mucosal breaks less than 5 mm, not extending       between tops of 2 mucosal folds) esophagitis with no bleeding was found       in the distal esophagus.      A 1 cm hiatal hernia was present.      The examined duodenum was normal.      The exam was otherwise without abnormality. Impression:           - LA Grade A reflux esophagitis.                       - 1 cm hiatal hernia.                       - Normal examined duodenum.                       - The examination was otherwise normal.                       - No specimens collected. Recommendation:       - Proceed with colonoscopy Procedure Code(s):    --- Professional ---                       [phone], Esophagogastroduodenoscopy, flexible, transoral;                        diagnostic, including collection of specimen(s) by                        brushing or washing, when performed (separate procedure) Diagnosis Code(s):    --- Professional ---                       K92.1, Melena (includes Hematochezia)                       R19.5, Other fecal abnormalities                       R10.13, Epigastric pain                       K44.9, Diaphragmatic hernia without obstruction or                        gangrene                       K21.0, Gastro-esophageal reflux disease with esophagitis  CPT copyright 2019 American Medical Association. All rights reserved. The codes documented in this report are preliminary and upon coder review may  be revised to meet current compliance requirements. Efrain Sella MD, MD 03/16/2019 8:12:28 AM This report has been signed electronically. Number of Addenda: 0 Note Initiated On: 03/16/2019 7:59 AM Estimated Blood Loss: Estimated blood loss: none.      [facility]

## 2019-03-16 NOTE — Consult Note (Addendum)
PHARMACY CONSULT NOTE - FOLLOW UP  Pharmacy Consult for Electrolyte Monitoring and Replacement   Recent Labs: Potassium (mmol/L)  Date Value  03/16/2019 3.1 (L)   Magnesium (mg/dL)  Date Value  03/15/2019 1.9   Calcium (mg/dL)  Date Value  03/16/2019 8.1 (L)   Albumin (g/dL)  Date Value  03/15/2019 3.3 (L)   Sodium (mmol/L)  Date Value  03/16/2019 137     Assessment: 43 y.o. female with a known history of DVT, gastroesophageal reflux disease, hypertension, polyneuropathy-is feeling increasingly weak which is progressively getting worse found with a HGB of 4 and symptomatic anemia.  Pharmacy has been consulted to monitor/replenish electrolytes.  Goal of Therapy:  Electrolytes wnl's  Plan:  Will give patient KCl IV 33meq x 4.    (Addendum: pt diet resumed post-procedure and this was changed to 23meq po)  Will recheck electrolytes with am labs.  Lu Duffel, PharmD, BCPS Clinical Pharmacist 03/16/2019 7:56 AM

## 2019-03-16 NOTE — Interval H&P Note (Signed)
History and Physical Interval Note:  03/16/2019 7:59 AM  Joanna Mylar ELMINA CHAUSSEE  has presented today for surgery, with the diagnosis of Symptomatic anemia, longstanding hx of NSAID use, epigastric pain.  The various methods of treatment have been discussed with the patient and family. After consideration of risks, benefits and other options for treatment, the patient has consented to  Procedure(s): ESOPHAGOGASTRODUODENOSCOPY (EGD) (N/A) COLONOSCOPY (N/A) as a surgical intervention.  The patient's history has been reviewed, patient examined, no change in status, stable for surgery.  I have reviewed the patient's chart and labs.  Questions were answered to the patient's satisfaction.     [place], [place]

## 2019-03-16 NOTE — Transfer of Care (Signed)
Immediate Anesthesia Transfer of Care Note  Patient: Joanna Garcia  Procedure(s) Performed: ESOPHAGOGASTRODUODENOSCOPY (EGD) (N/A ) COLONOSCOPY (N/A )  Patient Location: Endoscopy Unit  Anesthesia Type:General  Level of Consciousness: awake, alert  and patient cooperative  Airway & Oxygen Therapy: Patient Spontanous Breathing and Patient connected to nasal cannula oxygen  Post-op Assessment: Report given to RN and Post -op Vital signs reviewed and stable  Post vital signs: Reviewed and stable  Last Vitals:  Vitals Value Taken Time  BP    Temp    Pulse    Resp    SpO2      Last Pain:  Vitals:   03/16/19 0659  TempSrc: Tympanic  PainSc: 0-No pain         Complications: No apparent anesthesia complications

## 2019-03-16 NOTE — Anesthesia Preprocedure Evaluation (Signed)
Anesthesia Evaluation  Patient identified by MRN, date of birth, ID band Patient awake    Reviewed: Allergy & Precautions, NPO status , Patient's Chart, lab work & pertinent test results  History of Anesthesia Complications Negative for: history of anesthetic complications  Airway Mallampati: III       Dental  (+) Upper Dentures, Edentulous Lower   Pulmonary neg sleep apnea, neg COPD, Current Smoker,           Cardiovascular hypertension, Pt. on medications (-) Past MI and (-) CHF (-) dysrhythmias (-) Valvular Problems/Murmurs     Neuro/Psych Seizures - (hx of seizures with abuse of "pain killers" no meds, no problems in years),     GI/Hepatic Neg liver ROS, GERD  Medicated,  Endo/Other  neg diabetes  Renal/GU negative Renal ROS     Musculoskeletal   Abdominal   Peds  Hematology  (+) anemia ,   Anesthesia Other Findings   Reproductive/Obstetrics                             Anesthesia Physical Anesthesia Plan  ASA: III  Anesthesia Plan: General   Post-op Pain Management:    Induction: Intravenous  PONV Risk Score and Plan: 2 and Propofol infusion and TIVA  Airway Management Planned: Nasal Cannula  Additional Equipment:   Intra-op Plan:   Post-operative Plan:   Informed Consent: I have reviewed the patients History and Physical, chart, labs and discussed the procedure including the risks, benefits and alternatives for the proposed anesthesia with the patient or authorized representative who has indicated his/her understanding and acceptance.       Plan Discussed with:   Anesthesia Plan Comments:         Anesthesia Quick Evaluation

## 2019-03-17 LAB — BPAM RBC
Blood Product Expiration Date: 202010102359
Blood Product Expiration Date: 202010242359
Blood Product Expiration Date: 202010242359
Blood Product Expiration Date: 202010242359
ISSUE DATE / TIME: 202009231421
ISSUE DATE / TIME: 202009231700
ISSUE DATE / TIME: 202009240136
ISSUE DATE / TIME: 202009251520
Unit Type and Rh: 1700
Unit Type and Rh: 7300
Unit Type and Rh: 7300
Unit Type and Rh: 7300

## 2019-03-17 LAB — TYPE AND SCREEN
ABO/RH(D): B POS
Antibody Screen: NEGATIVE
Unit division: 0
Unit division: 0
Unit division: 0
Unit division: 0

## 2019-03-19 ENCOUNTER — Encounter: Payer: Self-pay | Admitting: Internal Medicine

## 2019-03-19 LAB — CULTURE, BLOOD (ROUTINE X 2)
Culture: NO GROWTH
Culture: NO GROWTH
Special Requests: ADEQUATE
Special Requests: ADEQUATE

## 2019-05-22 ENCOUNTER — Other Ambulatory Visit: Payer: Self-pay

## 2019-05-22 ENCOUNTER — Encounter: Payer: Self-pay | Admitting: Emergency Medicine

## 2019-05-22 ENCOUNTER — Emergency Department
Admission: EM | Admit: 2019-05-22 | Discharge: 2019-05-22 | Discharge status: Against Medical Advice | Payer: Self-pay | Attending: Student in an Organized Health Care Education/Training Program | Admitting: Student in an Organized Health Care Education/Training Program

## 2019-05-22 DIAGNOSIS — F17200 Nicotine dependence, unspecified, uncomplicated: Secondary | ICD-10-CM | POA: Insufficient documentation

## 2019-05-22 DIAGNOSIS — Z79899 Other long term (current) drug therapy: Secondary | ICD-10-CM | POA: Insufficient documentation

## 2019-05-22 DIAGNOSIS — D649 Anemia, unspecified: Secondary | ICD-10-CM | POA: Insufficient documentation

## 2019-05-22 DIAGNOSIS — I10 Essential (primary) hypertension: Secondary | ICD-10-CM | POA: Insufficient documentation

## 2019-05-22 LAB — COMPREHENSIVE METABOLIC PANEL
ALT: 40 U/L (ref 0–44)
AST: 59 U/L — ABNORMAL HIGH (ref 15–41)
Albumin: 4 g/dL (ref 3.5–5.0)
Alkaline Phosphatase: 94 U/L (ref 38–126)
Anion gap: 11 (ref 5–15)
BUN: 7 mg/dL (ref 6–20)
CO2: 24 mmol/L (ref 22–32)
Calcium: 8.8 mg/dL — ABNORMAL LOW (ref 8.9–10.3)
Chloride: 100 mmol/L (ref 98–111)
Creatinine, Ser: 0.53 mg/dL (ref 0.44–1.00)
GFR calc Af Amer: >60 mL/min (ref >60)
GFR calc non Af Amer: >60 mL/min (ref >60)
Glucose, Bld: 111 mg/dL — ABNORMAL HIGH (ref 70–99)
Potassium: 3.4 mmol/L — ABNORMAL LOW (ref 3.5–5.1)
Sodium: 135 mmol/L (ref 135–145)
Total Bilirubin: 1.3 mg/dL — ABNORMAL HIGH (ref 0.3–1.2)
Total Protein: 6.6 g/dL (ref 6.5–8.1)

## 2019-05-22 LAB — TYPE AND SCREEN
ABO/RH(D): B POS
Antibody Screen: NEGATIVE

## 2019-05-22 LAB — CBC
HCT: 23.4 % — ABNORMAL LOW (ref 36.0–46.0)
Hemoglobin: 8.1 g/dL — ABNORMAL LOW (ref 12.0–15.0)
MCH: 36.7 pg — ABNORMAL HIGH (ref 26.0–34.0)
MCHC: 34.6 g/dL (ref 30.0–36.0)
MCV: 105.9 fL — ABNORMAL HIGH (ref 80.0–100.0)
Platelets: 231 10*3/uL (ref 150–400)
RBC: 2.21 MIL/uL — ABNORMAL LOW (ref 3.87–5.11)
WBC: 6.8 10*3/uL (ref 4.0–10.5)
nRBC: 0.3 % — ABNORMAL HIGH (ref 0.0–0.2)

## 2019-05-22 MED ORDER — PANTOPRAZOLE SODIUM 20 MG PO TBEC: 20.0000 mg | DELAYED_RELEASE_TABLET | Freq: Every day | ORAL | 1 refills | Status: DC

## 2019-05-22 MED ORDER — IRON 325 (65 FE) MG PO TABS: 1.0000 | ORAL_TABLET | ORAL | 0 refills | Status: DC

## 2019-05-22 NOTE — ED Provider Notes (Signed)
[facility] Emergency Department Provider Note    First MD Initiated Contact with Patient 05/22/19 1934     (approximate)  I have reviewed the triage vital signs and the nursing notes.   HISTORY  Chief Complaint Sent by Dr for blood    HPI JODINE DIPIERO is a 43 y.o. female below listed past medical history  presents the ER from primary care physician for evaluation anemia and possibly needing transfusion.  Patient states she otherwise feels well.  Does not have any melena or hematochezia.  She denies any anticoagulation.  Denies any chest pain or shortness of breath.  States that she has not been taking her iron supplementation nor has she been taking her antiacid medication.  Denies any nausea or vomiting.   Past Medical History:  Diagnosis Date   DVT (deep venous thrombosis) (HCC)    GERD (gastroesophageal reflux disease)    Hypertension    Polyneuropathy    Family History  Problem Relation Age of Onset   Hypertension Mother    Hyperlipidemia Mother    Heart disease Mother    COPD Mother    Hypertension Father    Hyperlipidemia Father    Cancer Father        prostate   Past Surgical History:  Procedure Laterality Date   COLONOSCOPY N/A 03/16/2019   Procedure: COLONOSCOPY;  Surgeon: Toledo, Benay Pike, MD;  Location: [facility] ENDOSCOPY;  Service: Gastroenterology;  Laterality: N/A;   ESOPHAGOGASTRODUODENOSCOPY N/A 03/16/2019   Procedure: ESOPHAGOGASTRODUODENOSCOPY (EGD);  Surgeon: Toledo, Benay Pike, MD;  Location: [facility] ENDOSCOPY;  Service: Gastroenterology;  Laterality: N/A;   NO PAST SURGERIES     Patient Active Problem List   Diagnosis Date Noted   Symptomatic anemia 03/14/2019   GI bleed 03/14/2019   Superficial thrombophlebitis 02/12/2019   Thrombocytosis ([place]) 02/12/2019   GERD (gastroesophageal reflux disease) 02/12/2019   Essential hypertension 02/12/2019      Prior to Admission medications   Medication Sig  Start Date End Date Taking? Authorizing Provider  cephALEXin (KEFLEX) 500 MG capsule Take 1 capsule (500 mg total) by mouth every 12 (twelve) hours. 03/16/19   Nicholes Mango, MD  docusate sodium (COLACE) 100 MG capsule Take 1 capsule (100 mg total) by mouth 2 (two) times daily as needed for mild constipation. 03/16/19   Nicholes Mango, MD  Ferrous Sulfate (IRON) 325 (65 Fe) MG TABS Take 1 tablet (325 mg total) by mouth every other day. 05/22/19   Merlyn Lot, MD  ferrous sulfate 325 (65 FE) MG tablet Take 1 tablet (325 mg total) by mouth 2 (two) times daily with a meal. 03/17/19   Gouru, Aruna, MD  lisinopril (ZESTRIL) 2.5 MG tablet Take 1 tablet (2.5 mg total) by mouth daily. 03/16/19   Gouru, Illene Silver, MD  nicotine (NICODERM CQ - DOSED IN MG/24 HOURS) 21 mg/24hr patch Place 1 patch (21 mg total) onto the skin daily. 03/17/19   Nicholes Mango, MD  omeprazole (PRILOSEC) 40 MG capsule Take 1 capsule (40 mg total) by mouth daily. 03/16/19   Gouru, Illene Silver, MD  pantoprazole (PROTONIX) 20 MG tablet Take 1 tablet (20 mg total) by mouth daily. 05/22/19 05/21/20  Merlyn Lot, MD  vitamin C (VITAMIN C) 500 MG tablet Take 1 tablet (500 mg total) by mouth 2 (two) times daily. 03/16/19   Nicholes Mango, MD    Allergies Patient has no known allergies.    Social History Social History   Tobacco Use   Smoking status: Current  Every Day Smoker    Packs/day: 0.50   Smokeless tobacco: Never Used  Substance Use Topics   Alcohol use: Yes    Comment: 21 Oz daily   Drug use: Not Currently    Types: Marijuana    Review of Systems Patient denies headaches, rhinorrhea, blurry vision, numbness, shortness of breath, chest pain, edema, cough, abdominal pain, nausea, vomiting, diarrhea, dysuria, fevers, rashes or hallucinations unless otherwise stated above in HPI. ____________________________________________   PHYSICAL EXAM:  VITAL SIGNS: Vitals:   05/22/19 1540  BP: 131/71  Pulse: (!) 110  Resp: 18  Temp:  97.6 F (36.4 C)  SpO2: 99%    Constitutional: Alert and oriented.  Eyes: Conjunctivae are normal.  Head: Atraumatic. Nose: No congestion/rhinnorhea. Mouth/Throat: Mucous membranes are moist.   Neck: No stridor. Painless ROM.  Cardiovascular: Normal rate, regular rhythm. Grossly normal heart sounds.  Good peripheral circulation. Respiratory: Normal respiratory effort.  No retractions. Lungs CTAB. Gastrointestinal: Soft and nontender. No distention. No abdominal bruits. No CVA tenderness. Genitourinary: patient declined Musculoskeletal: No lower extremity tenderness nor edema.  No joint effusions. Neurologic:  Normal speech and language. No gross focal neurologic deficits are appreciated. No facial droop Skin:  Skin is warm, dry and intact. No rash noted. Psychiatric: Mood and affect are normal. Speech and behavior are normal.  ____________________________________________   LABS (all labs ordered are listed, but only abnormal results are displayed)  Results for orders placed or performed during the hospital encounter of 05/22/19 (from the past 24 hour(s))  Comprehensive metabolic panel     Status: Abnormal   Collection Time: 05/22/19  3:49 PM  Result Value Ref Range   Sodium 135 135 - 145 mmol/L   Potassium 3.4 (L) 3.5 - 5.1 mmol/L   Chloride 100 98 - 111 mmol/L   CO2 24 22 - 32 mmol/L   Glucose, Bld 111 (H) 70 - 99 mg/dL   BUN 7 6 - 20 mg/dL   Creatinine, Ser 0.53 0.44 - 1.00 mg/dL   Calcium 8.8 (L) 8.9 - 10.3 mg/dL   Total Protein 6.6 6.5 - 8.1 g/dL   Albumin 4.0 3.5 - 5.0 g/dL   AST 59 (H) 15 - 41 U/L   ALT 40 0 - 44 U/L   Alkaline Phosphatase 94 38 - 126 U/L   Total Bilirubin 1.3 (H) 0.3 - 1.2 mg/dL   GFR calc non Af Amer >60 >60 mL/min   GFR calc Af Amer >60 >60 mL/min   Anion gap 11 5 - 15  CBC     Status: Abnormal   Collection Time: 05/22/19  3:49 PM  Result Value Ref Range   WBC 6.8 4.0 - 10.5 K/uL   RBC 2.21 (L) 3.87 - 5.11 MIL/uL   Hemoglobin 8.1 (L) 12.0 -  15.0 g/dL   HCT 23.4 (L) 36.0 - 46.0 %   MCV 105.9 (H) 80.0 - 100.0 fL   MCH 36.7 (H) 26.0 - 34.0 pg   MCHC 34.6 30.0 - 36.0 g/dL   RDW Not Measured 11.5 - 15.5 %   Platelets 231 150 - 400 K/uL   nRBC 0.3 (H) 0.0 - 0.2 %  Type and screen [facility]     Status: None   Collection Time: 05/22/19  3:49 PM  Result Value Ref Range   ABO/RH(D) B POS    Antibody Screen NEG    Sample Expiration      05/25/2019,2359 Performed at [facility], [address]  [place], [place] [id number]    ____________________________________________ ____________________________________________  RADIOLOGY   ____________________________________________   PROCEDURES  Procedure(s) performed:  Procedures    Critical Care performed: no ____________________________________________   INITIAL IMPRESSION / ASSESSMENT AND PLAN / ED COURSE  Pertinent labs & imaging results that were available during my care of the patient were reviewed by me and considered in my medical decision making (see chart for details).   DDX: Iron deficiency anemia, malnourishment, melena, GI bleed, chronic anemia, symptomatic anemia  DIANNAH LOSI is a 43 y.o. who presents to the ED with symptoms as described above.  Patient is well-appearing and in no acute distress.  She denies any discomfort.  Her abdominal exam is soft and benign.  She not any blood thinners.  Prior record her hemoglobin is actually at baseline and improved from her admission.  She is not having any symptoms of GI bleed with no hematochezia or melena.  I did strongly recommend rectal exam or stool study for guaiac to ensure that she is not having any acute blood loss the patient has declined this stating she wants to leave.  As an alternative discussed option for observation the hospital.  At this point given her stability I do not see any indication for emergent transfusion..  Patient states that she would like to be discharged.   Informed her that would be leaving [place].  She demonstrates understanding that leaving without complete diagnostic work-up could result in worsening of her underlying condition, pain and even death.  She states that she feels well would prefer prescription for iron supplement and follow-up with hematology as an outpatient.     The patient was evaluated in Emergency Department today for the symptoms described in the history of present illness. He/she was evaluated in the context of the global COVID-19 pandemic, which necessitated consideration that the patient might be at risk for infection with the SARS-CoV-2 virus that causes COVID-19. Institutional protocols and algorithms that pertain to the evaluation of patients at risk for COVID-19 are in a state of rapid change based on information released by regulatory bodies including the CDC and federal and state organizations. These policies and algorithms were followed during the patient's care in the ED.  As part of my medical decision making, I reviewed the following data within the [place] notes reviewed and incorporated, Labs reviewed, notes from prior ED visits and [place] Controlled Substance Database   ____________________________________________   FINAL CLINICAL IMPRESSION(S) / ED DIAGNOSES  Final diagnoses:  Anemia, unspecified type      NEW MEDICATIONS STARTED DURING THIS VISIT:  New Prescriptions   FERROUS SULFATE (IRON) 325 (65 FE) MG TABS    Take 1 tablet (325 mg total) by mouth every other day.   PANTOPRAZOLE (PROTONIX) 20 MG TABLET    Take 1 tablet (20 mg total) by mouth daily.     Note:  This document was prepared using Dragon voice recognition software and may include unintentional dictation errors.    Merlyn Lot, MD 05/22/19 2007

## 2019-05-22 NOTE — ED Triage Notes (Signed)
Pt sent here from Dr office with c/o low HGB, states 7.9 per lab drawn this afternoon. Pt denies any active bleeding at this time only complaint is fatigue. NAD.

## 2019-05-22 NOTE — ED Triage Notes (Signed)
First Nurse Note:  States needs a blood transfusion.  Had blood work drawn at [facility] and was sent to ED for transfusion.  States HGB:  7.9.  AAOx3.  Skin warm and dry. NAD

## 2019-06-05 ENCOUNTER — Observation Stay
Admission: EM | Admit: 2019-06-05 | Discharge: 2019-06-07 | Disposition: A | Discharge status: Home / Self Care | Payer: Self-pay | Attending: Family Medicine | Admitting: Family Medicine

## 2019-06-05 ENCOUNTER — Other Ambulatory Visit: Payer: Self-pay

## 2019-06-05 DIAGNOSIS — K2100 Gastro-esophageal reflux disease with esophagitis, without bleeding: Secondary | ICD-10-CM | POA: Insufficient documentation

## 2019-06-05 DIAGNOSIS — K5730 Diverticulosis of large intestine without perforation or abscess without bleeding: Secondary | ICD-10-CM | POA: Insufficient documentation

## 2019-06-05 DIAGNOSIS — Z20828 Contact with and (suspected) exposure to other viral communicable diseases: Secondary | ICD-10-CM | POA: Insufficient documentation

## 2019-06-05 DIAGNOSIS — Z79899 Other long term (current) drug therapy: Secondary | ICD-10-CM | POA: Insufficient documentation

## 2019-06-05 DIAGNOSIS — D519 Vitamin B12 deficiency anemia, unspecified: Secondary | ICD-10-CM | POA: Insufficient documentation

## 2019-06-05 DIAGNOSIS — N390 Urinary tract infection, site not specified: Secondary | ICD-10-CM

## 2019-06-05 DIAGNOSIS — D649 Anemia, unspecified: Principal | ICD-10-CM | POA: Diagnosis present

## 2019-06-05 DIAGNOSIS — F1010 Alcohol abuse, uncomplicated: Secondary | ICD-10-CM | POA: Insufficient documentation

## 2019-06-05 DIAGNOSIS — K648 Other hemorrhoids: Secondary | ICD-10-CM | POA: Insufficient documentation

## 2019-06-05 DIAGNOSIS — R531 Weakness: Secondary | ICD-10-CM | POA: Insufficient documentation

## 2019-06-05 DIAGNOSIS — Z86718 Personal history of other venous thrombosis and embolism: Secondary | ICD-10-CM | POA: Insufficient documentation

## 2019-06-05 DIAGNOSIS — K449 Diaphragmatic hernia without obstruction or gangrene: Secondary | ICD-10-CM | POA: Insufficient documentation

## 2019-06-05 DIAGNOSIS — K219 Gastro-esophageal reflux disease without esophagitis: Secondary | ICD-10-CM | POA: Insufficient documentation

## 2019-06-05 DIAGNOSIS — R42 Dizziness and giddiness: Secondary | ICD-10-CM | POA: Insufficient documentation

## 2019-06-05 DIAGNOSIS — Z8249 Family history of ischemic heart disease and other diseases of the circulatory system: Secondary | ICD-10-CM | POA: Insufficient documentation

## 2019-06-05 DIAGNOSIS — F17210 Nicotine dependence, cigarettes, uncomplicated: Secondary | ICD-10-CM | POA: Insufficient documentation

## 2019-06-05 DIAGNOSIS — I10 Essential (primary) hypertension: Secondary | ICD-10-CM | POA: Diagnosis present

## 2019-06-05 LAB — BASIC METABOLIC PANEL
Anion gap: 12 (ref 5–15)
BUN: 6 mg/dL (ref 6–20)
CO2: 23 mmol/L (ref 22–32)
Calcium: 9.1 mg/dL (ref 8.9–10.3)
Chloride: 101 mmol/L (ref 98–111)
Creatinine, Ser: 0.61 mg/dL (ref 0.44–1.00)
GFR calc Af Amer: >60 mL/min (ref >60)
GFR calc non Af Amer: >60 mL/min (ref >60)
Glucose, Bld: 99 mg/dL (ref 70–99)
Potassium: 4.5 mmol/L (ref 3.5–5.1)
Sodium: 136 mmol/L (ref 135–145)

## 2019-06-05 LAB — IRON AND TIBC
Iron: 365 ug/dL — ABNORMAL HIGH (ref 28–170)
Saturation Ratios: 94 % — ABNORMAL HIGH (ref 10.4–31.8)
TIBC: 387 ug/dL (ref 250–450)
UIBC: 22 ug/dL

## 2019-06-05 LAB — URINALYSIS, COMPLETE (UACMP) WITH MICROSCOPIC
Bilirubin Urine: NEGATIVE
Glucose, UA: NEGATIVE mg/dL
Ketones, ur: NEGATIVE mg/dL
Nitrite: NEGATIVE
Protein, ur: NEGATIVE mg/dL
Specific Gravity, Urine: 1.008 (ref 1.005–1.030)
pH: 6 (ref 5.0–8.0)

## 2019-06-05 LAB — PREPARE RBC (CROSSMATCH)

## 2019-06-05 LAB — FOLATE: Folate: 1.6 ng/mL — ABNORMAL LOW (ref >5.9)

## 2019-06-05 LAB — CBC
HCT: 19.7 % — ABNORMAL LOW (ref 36.0–46.0)
Hemoglobin: 6.8 g/dL — ABNORMAL LOW (ref 12.0–15.0)
MCH: 37 pg — ABNORMAL HIGH (ref 26.0–34.0)
MCHC: 34.5 g/dL (ref 30.0–36.0)
MCV: 107.1 fL — ABNORMAL HIGH (ref 80.0–100.0)
Platelets: 222 10*3/uL (ref 150–400)
RBC: 1.84 MIL/uL — ABNORMAL LOW (ref 3.87–5.11)
WBC: 6.6 10*3/uL (ref 4.0–10.5)
nRBC: 0 % (ref 0.0–0.2)

## 2019-06-05 LAB — FERRITIN: Ferritin: 205 ng/mL (ref 11–307)

## 2019-06-05 MED ORDER — NICOTINE 21 MG/24HR TD PT24
21.0000 mg | MEDICATED_PATCH | Freq: Every day | TRANSDERMAL | Status: DC
  Administered 2019-06-05 – 2019-06-06 (×3): 21 mg via TRANSDERMAL
  Filled 2019-06-05 (×3): qty 1

## 2019-06-05 MED ORDER — SODIUM CHLORIDE 0.9 % IV BOLUS: 1000.0000 mL | Freq: Once | INTRAVENOUS | Status: DC

## 2019-06-05 MED ORDER — ONDANSETRON HCL 4 MG PO TABS
4.0000 mg | ORAL_TABLET | Freq: Four times a day (QID) | ORAL | Status: DC | PRN
  Filled 2019-06-05: qty 1

## 2019-06-05 MED ORDER — ONDANSETRON HCL 4 MG/2ML IJ SOLN: 4.0000 mg | Freq: Four times a day (QID) | INTRAMUSCULAR | Status: DC | PRN

## 2019-06-05 MED ORDER — ACETAMINOPHEN 650 MG RE SUPP: 650.0000 mg | Freq: Four times a day (QID) | RECTAL | Status: DC | PRN

## 2019-06-05 MED ORDER — SENNOSIDES-DOCUSATE SODIUM 8.6-50 MG PO TABS
1.0000 | ORAL_TABLET | Freq: Every evening | ORAL | Status: DC | PRN
  Filled 2019-06-05: qty 1

## 2019-06-05 MED ORDER — SODIUM CHLORIDE 0.9 % IV SOLN
10.0000 mL/h | Freq: Once | INTRAVENOUS | Status: AC
  Administered 2019-06-05: 10 mL/h via INTRAVENOUS

## 2019-06-05 MED ORDER — ACETAMINOPHEN 325 MG PO TABS: 650.0000 mg | ORAL_TABLET | Freq: Four times a day (QID) | ORAL | Status: DC | PRN

## 2019-06-05 MED ORDER — ONDANSETRON HCL 4 MG/2ML IJ SOLN
4.0000 mg | Freq: Once | INTRAMUSCULAR | Status: AC
  Administered 2019-06-05: 4 mg via INTRAVENOUS
  Filled 2019-06-05: qty 2

## 2019-06-05 MED ORDER — CEPHALEXIN 500 MG PO CAPS
500.0000 mg | ORAL_CAPSULE | Freq: Two times a day (BID) | ORAL | Status: DC
  Filled 2019-06-05: qty 1

## 2019-06-05 MED ORDER — SODIUM CHLORIDE 0.9% FLUSH: 3.0000 mL | Freq: Once | INTRAVENOUS | Status: DC

## 2019-06-05 NOTE — ED Provider Notes (Signed)
[facility] Emergency Department Provider Note  ____________________________________________  Time seen: Approximately 7:54 PM  I have reviewed the triage vital signs and the nursing notes.   HISTORY  Chief Complaint Weakness    HPI Joanna Garcia is a 43 y.o. female with a history of DVT GERD hypertension and GI bleed who comes the ED complaining of generalized weakness has been worsening over the past month.  She came to the ED 2 weeks ago and at that time was found to have a hemoglobin of 8.1, did not require transfusion.  However, she is continued to become increasingly symptomatic.  When she stands up she gets lightheaded, when she walks to get short of breath, she feels very weak.  Symptoms are constant, no alleviating factors.  Denies chest pain fever chills or sweats.  She also reports that for the past 5 months she has had numbness and tingling in both hands and feet.  Also has a sense of diminished proprioception.  She denies any hematemesis nausea or black or bloody stool.  Reports she is not able to tolerate iron supplements, but she is taking omeprazole twice daily as directed.   Past Medical History:  Diagnosis Date  . DVT (deep venous thrombosis) ([place])   . GERD (gastroesophageal reflux disease)   . Hypertension   . Polyneuropathy      Patient Active Problem List   Diagnosis Date Noted  . Symptomatic anemia 03/14/2019  . GI bleed 03/14/2019  . Superficial thrombophlebitis 02/12/2019  . Thrombocytosis ([place]) 02/12/2019  . GERD (gastroesophageal reflux disease) 02/12/2019  . Essential hypertension 02/12/2019     Past Surgical History:  Procedure Laterality Date  . COLONOSCOPY N/A 03/16/2019   Procedure: COLONOSCOPY;  Surgeon: Toledo, Benay Pike, MD;  Location: [facility] ENDOSCOPY;  Service: Gastroenterology;  Laterality: N/A;  . ESOPHAGOGASTRODUODENOSCOPY N/A 03/16/2019   Procedure: ESOPHAGOGASTRODUODENOSCOPY (EGD);  Surgeon: Toledo, Benay Pike, MD;  Location: [facility] ENDOSCOPY;  Service: Gastroenterology;  Laterality: N/A;  . NO PAST SURGERIES       Prior to Admission medications   Medication Sig Start Date End Date Taking? Authorizing Provider  cephALEXin (KEFLEX) 500 MG capsule Take 1 capsule (500 mg total) by mouth every 12 (twelve) hours. 03/16/19   Nicholes Mango, MD  docusate sodium (COLACE) 100 MG capsule Take 1 capsule (100 mg total) by mouth 2 (two) times daily as needed for mild constipation. 03/16/19   Nicholes Mango, MD  Ferrous Sulfate (IRON) 325 (65 Fe) MG TABS Take 1 tablet (325 mg total) by mouth every other day. 05/22/19   Merlyn Lot, MD  ferrous sulfate 325 (65 FE) MG tablet Take 1 tablet (325 mg total) by mouth 2 (two) times daily with a meal. 03/17/19   Gouru, Aruna, MD  lisinopril (ZESTRIL) 2.5 MG tablet Take 1 tablet (2.5 mg total) by mouth daily. 03/16/19   Gouru, Illene Silver, MD  nicotine (NICODERM CQ - DOSED IN MG/24 HOURS) 21 mg/24hr patch Place 1 patch (21 mg total) onto the skin daily. 03/17/19   Nicholes Mango, MD  omeprazole (PRILOSEC) 40 MG capsule Take 1 capsule (40 mg total) by mouth daily. 03/16/19   Gouru, Illene Silver, MD  pantoprazole (PROTONIX) 20 MG tablet Take 1 tablet (20 mg total) by mouth daily. 05/22/19 05/21/20  Merlyn Lot, MD  vitamin C (VITAMIN C) 500 MG tablet Take 1 tablet (500 mg total) by mouth 2 (two) times daily. 03/16/19   Nicholes Mango, MD     Allergies Patient has no known  allergies.   Family History  Problem Relation Age of Onset  . Hypertension Mother   . Hyperlipidemia Mother   . Heart disease Mother   . COPD Mother   . Hypertension Father   . Hyperlipidemia Father   . Cancer Father        prostate    Social History Social History   Tobacco Use  . Smoking status: Current Every Day Smoker    Packs/day: 0.50  . Smokeless tobacco: Never Used  Substance Use Topics  . Alcohol use: Yes    Comment: 21 Oz daily  . Drug use: Not Currently    Types: Marijuana    Review of  Systems  Constitutional:   No fever or chills.  ENT:   No sore throat. No rhinorrhea. Cardiovascular:   No chest pain or syncope. Respiratory:   Positive dyspnea on exertion.  No cough. Gastrointestinal:   Negative for abdominal pain, vomiting and diarrhea.  Musculoskeletal:   Negative for focal pain or swelling All other systems reviewed and are negative except as documented above in ROS and HPI.  ____________________________________________   PHYSICAL EXAM:  VITAL SIGNS: ED Triage Vitals  Enc Vitals Group     BP 06/05/19 1706 (!) 148/60     Pulse Rate 06/05/19 1706 (!) 115     Resp 06/05/19 1706 18     Temp 06/05/19 1706 97.9 F (36.6 C)     Temp src --      SpO2 06/05/19 1706 100 %     Weight 06/05/19 1708 157 lb (71.2 kg)     Height 06/05/19 1708 5\' 2"  (1.575 m)     Head Circumference --      Peak Flow --      Pain Score 06/05/19 1708 0     Pain Loc --      Pain Edu? --      Excl. in [place]? --     Vital signs reviewed, nursing assessments reviewed.   Constitutional:   Alert and oriented. Non-toxic appearance. Eyes:   Conjunctivae are pale. EOMI. PERRL. ENT      Head:   Normocephalic and atraumatic.      Nose:   Wearing a mask.      Mouth/Throat:   Wearing a mask.      Neck:   No meningismus. Full ROM. Hematological/Lymphatic/Immunilogical:   No cervical lymphadenopathy. Cardiovascular:   Tachycardia heart rate 110. Symmetric bilateral radial and DP pulses.  No murmurs. Cap refill less than 2 seconds. Respiratory:   Normal respiratory effort without tachypnea/retractions. Breath sounds are clear and equal bilaterally. No wheezes/rales/rhonchi. Gastrointestinal:   Soft and nontender. Non distended. There is no CVA tenderness.  No rebound, rigidity, or guarding. Musculoskeletal:   Normal range of motion in all extremities. No joint effusions.  No lower extremity tenderness.  No edema. Neurologic:   Normal speech and language.  Motor grossly intact. No acute focal  neurologic deficits are appreciated.  Skin:    Skin is warm, dry and intact. No rash noted.  No petechiae, purpura, or bullae.  ____________________________________________    LABS (pertinent positives/negatives) (all labs ordered are listed, but only abnormal results are displayed) Labs Reviewed  CBC - Abnormal; Notable for the following components:      Result Value   RBC 1.84 (*)    Hemoglobin 6.8 (*)    HCT 19.7 (*)    MCV 107.1 (*)    MCH 37.0 (*)    All other components  within normal limits  BASIC METABOLIC PANEL  URINALYSIS, COMPLETE (UACMP) WITH MICROSCOPIC  METHYLMALONIC ACID, SERUM  FOLATE  IRON AND TIBC  FERRITIN  VITAMIN B12  CBG MONITORING, ED  TYPE AND SCREEN  PREPARE RBC (CROSSMATCH)   ____________________________________________   EKG  Interpreted by me Sinus tachycardia rate 113.  Normal axis intervals QRS ST segments and T waves.  ____________________________________________    RADIOLOGY  No results found.  ____________________________________________   PROCEDURES .Critical Care Performed by: Carrie Mew, MD Authorized by: Carrie Mew, MD   Critical care provider statement:    Critical care time (minutes):  32   Critical care time was exclusive of:  Separately billable procedures and treating other patients   Critical care was necessary to treat or prevent imminent or life-threatening deterioration of the following conditions:  Circulatory failure and shock   Critical care was time spent personally by me on the following activities:  Development of treatment plan with patient or surrogate, discussions with consultants, evaluation of patient's response to treatment, examination of patient, obtaining history from patient or surrogate, ordering and performing treatments and interventions, ordering and review of laboratory studies, ordering and review of radiographic studies, pulse oximetry, re-evaluation of patient's condition and  review of old charts    ____________________________________________    CLINICAL IMPRESSION / ASSESSMENT AND PLAN / ED COURSE  Medications ordered in the ED: Medications  sodium chloride flush (NS) 0.9 % injection 3 mL (has no administration in time range)  0.9 %  sodium chloride infusion (has no administration in time range)    Pertinent labs & imaging results that were available during my care of the patient were reviewed by me and considered in my medical decision making (see chart for details).  Joanna Garcia was evaluated in Emergency Department on 06/05/2019 for the symptoms described in the history of present illness. She was evaluated in the context of the global COVID-19 pandemic, which necessitated consideration that the patient might be at risk for infection with the SARS-CoV-2 virus that causes COVID-19. Institutional protocols and algorithms that pertain to the evaluation of patients at risk for COVID-19 are in a state of rapid change based on information released by regulatory bodies including the CDC and federal and state organizations. These policies and algorithms were followed during the patient's care in the ED.   Patient presents with symptoms of symptomatic anemia.  Hemoglobin is 6.8 today.  It appears she was not able to have nutritional work-up completed during her last hospitalization so I will add on iron panel folate MMA B12.  plan to transfuse 2 units and admit for further work-up.  Reviewed upper endoscopy and colonoscopy result from Dr. Alice Reichert from September 2020, overall unremarkable and not explaining the symptoms.  Patient has no symptoms of GI bleeding currently.      ____________________________________________   FINAL CLINICAL IMPRESSION(S) / ED DIAGNOSES    Final diagnoses:  Generalized weakness  Dizziness  Symptomatic anemia     ED Discharge Orders    None      Portions of this note were generated with dragon dictation software.  Dictation errors may occur despite best attempts at proofreading.   Carrie Mew, MD 06/05/19 2003

## 2019-06-05 NOTE — ED Notes (Signed)
Attempted IV without success- pt had multiple IV attempts in triage and is hard stick, [place] IV requested.

## 2019-06-05 NOTE — ED Notes (Signed)
Pt states she is tired and weak, but denies dizziness/CP. Pt appears pale, non-diaphoretic.

## 2019-06-05 NOTE — ED Notes (Signed)
Bolus D/C by this RN due to Sam, RN stating to this RN bolus entered due to IV team not coming unless meds ordered.

## 2019-06-05 NOTE — ED Notes (Signed)
Pt offered toilet, declines at this time. No further needs identified.

## 2019-06-05 NOTE — ED Notes (Signed)
Pt stuck multiple time and unable to gain IV access or blood. IV team consult to be placed.

## 2019-06-05 NOTE — ED Notes (Signed)
Placed draw time for hemoglobin following first transfusion. BB will not release second unit until results obtained. MD aware

## 2019-06-05 NOTE — ED Notes (Signed)
Lights dimmed for pt comfort. Pt provided snack per request. Pt has call light in reach. No further needs identified at this time.

## 2019-06-05 NOTE — ED Notes (Signed)
Unsure why PO antibiotic was ordered- messaged MD to verify order.

## 2019-06-05 NOTE — ED Triage Notes (Addendum)
Pt comes via POV from home with c/o weakness and low hgb.  Pt states her hgb was 7.1 as of last Thursday. Pt appears pale and weak. Pt states this has been going on for awhile.  Pt states she was admitted back in September and her hgb was a 4.  Pt states some nausea and vomiting.

## 2019-06-05 NOTE — ED Notes (Signed)
Pt states she had reaction to blood transfusion in past but was told it was due to blood being administered too fast.

## 2019-06-05 NOTE — H&P (Addendum)
History and Physical    Joanna Garcia [id number] DOB: 1975-12-22 DOA: 06/05/2019  PCP: Letta Median, MD   I have personally briefly reviewed patient's old medical records in [place]  Chief Complaint: Weakness  HPI: Joanna Garcia is a 43 y.o. female with past medical history of GERD and hypertension, who was hospitalized in September 2020 with symptomatic anemia requiring blood transfusion and negative upper and lower endoscopy at that time who returns to the emergency room with a several day history of weakness, associated with lightheadedness, dyspnea on exertion.  Denies orthopnea or PND and chest pain or palpitations.  Cough fever or chills. Patient  Denies hematemesis, black or bloody stool. She denies taking NSAIDS but admits to alcohol use, drinking 25oz beer every night.  In the emergency room she was mildly tachycardic with a heart rate of 115 with fair blood pressure of 148/60.  Blood work revealed hemoglobin of 6.8 but was for the most part otherwise unremarkable. Started on blood transfusions in the emergency room and hospitalization requested.  Review of Systems: As per HPI otherwise 10 point review of systems negative.  Review of Systems  Constitutional: Positive for malaise/fatigue. Negative for chills, diaphoresis and fever.  HENT: Negative for congestion and sinus pain.   Eyes: Negative for blurred vision.  Respiratory: Positive for shortness of breath. Negative for cough, hemoptysis and wheezing.   Cardiovascular: Negative for chest pain, palpitations, orthopnea, leg swelling and PND.  Gastrointestinal: Negative for abdominal pain, blood in stool, diarrhea, melena, nausea and vomiting.  Genitourinary: Negative for dysuria, flank pain, hematuria and urgency.  Musculoskeletal: Negative for back pain and myalgias.  Skin: Negative for rash.  Neurological: Positive for dizziness and weakness. Negative for headaches.  Endo/Heme/Allergies: Does not  bruise/bleed easily.     Past Medical History:  Diagnosis Date  . DVT (deep venous thrombosis) ([place])   . GERD (gastroesophageal reflux disease)   . Hypertension   . Polyneuropathy     Past Surgical History:  Procedure Laterality Date  . COLONOSCOPY N/A 03/16/2019   Procedure: COLONOSCOPY;  Surgeon: Toledo, Benay Pike, MD;  Location: [facility] ENDOSCOPY;  Service: Gastroenterology;  Laterality: N/A;  . ESOPHAGOGASTRODUODENOSCOPY N/A 03/16/2019   Procedure: ESOPHAGOGASTRODUODENOSCOPY (EGD);  Surgeon: Toledo, Benay Pike, MD;  Location: [facility] ENDOSCOPY;  Service: Gastroenterology;  Laterality: N/A;  . NO PAST SURGERIES       reports that she has been smoking. She has been smoking about 0.50 packs per day. She has never used smokeless tobacco. She reports current alcohol use. She reports previous drug use. Drug: Marijuana.  No Known Allergies  Family History  Problem Relation Age of Onset  . Hypertension Mother   . Hyperlipidemia Mother   . Heart disease Mother   . COPD Mother   . Hypertension Father   . Hyperlipidemia Father   . Cancer Father        prostate    Prior to Admission medications   Medication Sig Start Date End Date Taking? Authorizing Provider  gabapentin (NEURONTIN) 300 MG capsule Take 300 mg by mouth 3 (three) times daily.   Yes [provider]  lisinopril (ZESTRIL) 2.5 MG tablet Take 1 tablet (2.5 mg total) by mouth daily. 03/16/19  Yes Gouru, Illene Silver, MD  omeprazole (PRILOSEC) 40 MG capsule Take 1 capsule (40 mg total) by mouth daily. Patient taking differently: Take 40 mg by mouth 2 (two) times daily.  03/16/19  Yes Nicholes Mango, MD    Physical Exam: Vitals:  06/05/19 1708 06/05/19 1939 06/05/19 1956 06/05/19 2000  BP:  (!) 155/63    Pulse:  96 (!) 110   Resp:  15 18 13   Temp:      SpO2:  100% 98%   Weight: 71.2 kg     Height: 5\' 2"  (1.575 m)       Constitutional: NAD, calm, comfortable Vitals:   06/05/19 1708 06/05/19 1939 06/05/19 1956 06/05/19  2000  BP:  (!) 155/63    Pulse:  96 (!) 110   Resp:  15 18 13   Temp:      SpO2:  100% 98%   Weight: 71.2 kg     Height: 5\' 2"  (1.575 m)      Patient appears weak but otherwise alert and oriented x3.  No acute distress ENMT: Mucous membranes are moist. Posterior pharynx clear of any exudate or lesions.Normal dentition.  Neck: normal, supple, no masses, no thyromegaly Respiratory: clear to auscultation bilaterally, no wheezing, no crackles. Normal respiratory effort. No accessory muscle use.  Cardiovascular: Mild tachycardia, no murmurs / rubs / gallops. No extremity edema. 2+ pedal pulses. No carotid bruits.  Abdomen: no tenderness, no masses palpated. No hepatosplenomegaly. Bowel sounds positive.  Musculoskeletal: no clubbing / cyanosis. No joint deformity upper and lower extremities. Good ROM, no contractures. Normal muscle tone.  Skin: no rashes, lesions, ulcers. No induration Neurologic: CN 2-12 grossly intact.strength 4/5 on right. Speech clear Psychiatric: Normal judgment and insight. Alert and oriented x 3. Normal mood.   Labs on Admission: I have personally reviewed following labs and imaging studies  CBC: Recent Labs  Lab 06/05/19 1710  WBC 6.6  HGB 6.8*  HCT 19.7*  MCV 107.1*  PLT [id number]   Basic Metabolic Panel: Recent Labs  Lab 06/05/19 1710  NA 136  K 4.5  CL 101  CO2 23  GLUCOSE 99  BUN 6  CREATININE 0.61  CALCIUM 9.1   GFR: Estimated Creatinine Clearance: 85.5 mL/min (by C-G formula based on SCr of 0.61 mg/dL). Liver Function Tests: No results for input(s): AST, ALT, ALKPHOS, BILITOT, PROT, ALBUMIN in the last 168 hours. No results for input(s): LIPASE, AMYLASE in the last 168 hours. No results for input(s): AMMONIA in the last 168 hours. Coagulation Profile: No results for input(s): INR, PROTIME in the last 168 hours. Cardiac Enzymes: No results for input(s): CKTOTAL, CKMB, CKMBINDEX, TROPONINI in the last 168 hours. BNP (last 3 results) No results  for input(s): PROBNP in the last 8760 hours. HbA1C: No results for input(s): HGBA1C in the last 72 hours. CBG: No results for input(s): GLUCAP in the last 168 hours. Lipid Profile: No results for input(s): CHOL, HDL, LDLCALC, TRIG, CHOLHDL, LDLDIRECT in the last 72 hours. Thyroid Function Tests: No results for input(s): TSH, T4TOTAL, FREET4, T3FREE, THYROIDAB in the last 72 hours. Anemia Panel: No results for input(s): VITAMINB12, FOLATE, FERRITIN, TIBC, IRON, RETICCTPCT in the last 72 hours. Urine analysis:    Component Value Date/Time   COLORURINE AMBER (A) 03/14/2019 1413   APPEARANCEUR CLOUDY (A) 03/14/2019 1413   LABSPEC 1.015 03/14/2019 1413   PHURINE 7.0 03/14/2019 1413   GLUCOSEU NEGATIVE 03/14/2019 1413   HGBUR NEGATIVE 03/14/2019 1413   BILIRUBINUR SMALL (A) 03/14/2019 1413   KETONESUR NEGATIVE 03/14/2019 1413   PROTEINUR 30 (A) 03/14/2019 1413   NITRITE POSITIVE (A) 03/14/2019 1413   LEUKOCYTESUR MODERATE (A) 03/14/2019 1413    Radiological Exams on Admission: No results found.   Assessment/Plan Principal Problem:   Symptomatic anemia,  recurrent --- Continue blood transfusion initiated in the emergency room for total of 2 units ---Etiology of anemia remains uncertain but patient noted to have an elevated MCV and has a history of daily alcohol use --- Follow-up anemia studies. --- Hospitalized on 03/16/2019 with hemoglobin of 5 and had a positive stool guaiac at the time  ---patient had both upper and lower endoscopy on 03/16/2019 another episode of bleeding.  Upper endoscopy revealed reflux esophagitis and colonoscopy revealed nonbleeding internal hemorrhoids --- Stool guaiac, positive, GI consult to consider capsule endoscopy. ---  Might also benefit from hematology consult     Essential hypertension --- Controlled.  Continue home medication monitor for hypertension  Alcohol abuse ---patient drinks a 25oz beer nightly ---advised to cut back  Nicotine  dependence --counseled on quitting   Athena Masse MD [facility]   If 7PM-7AM, please contact night-coverage [email or web address] Password [email or web address]  06/05/2019, 8:16 PM

## 2019-06-06 ENCOUNTER — Encounter: Payer: Self-pay | Admitting: Internal Medicine

## 2019-06-06 LAB — HEMOGLOBIN AND HEMATOCRIT, BLOOD
HCT: 21.9 % — ABNORMAL LOW (ref 36.0–46.0)
HCT: 27.2 % — ABNORMAL LOW (ref 36.0–46.0)
Hemoglobin: 7.4 g/dL — ABNORMAL LOW (ref 12.0–15.0)
Hemoglobin: 9.4 g/dL — ABNORMAL LOW (ref 12.0–15.0)

## 2019-06-06 LAB — VITAMIN B12: Vitamin B-12: 76 pg/mL — ABNORMAL LOW (ref 180–914)

## 2019-06-06 LAB — SARS CORONAVIRUS 2 (TAT 6-24 HRS): SARS Coronavirus 2: NEGATIVE

## 2019-06-06 LAB — PREPARE RBC (CROSSMATCH)

## 2019-06-06 MED ORDER — VITAMIN B-12 1000 MCG PO TABS
1000.0000 ug | ORAL_TABLET | Freq: Every day | ORAL | Status: DC
  Administered 2019-06-06: 11:00:00 1000 ug via ORAL
  Filled 2019-06-06 (×2): qty 1

## 2019-06-06 MED ORDER — ADULT MULTIVITAMIN W/MINERALS CH
1.0000 | ORAL_TABLET | Freq: Every day | ORAL | Status: DC
  Administered 2019-06-06 – 2019-06-07 (×2): 1 via ORAL
  Filled 2019-06-06 (×3): qty 1

## 2019-06-06 MED ORDER — THIAMINE HCL 100 MG PO TABS
100.0000 mg | ORAL_TABLET | Freq: Every day | ORAL | Status: DC
  Administered 2019-06-06 – 2019-06-07 (×2): 100 mg via ORAL
  Filled 2019-06-06 (×3): qty 1

## 2019-06-06 MED ORDER — PANTOPRAZOLE SODIUM 40 MG PO TBEC
40.0000 mg | DELAYED_RELEASE_TABLET | Freq: Every day | ORAL | Status: DC
  Administered 2019-06-06 – 2019-06-07 (×2): 40 mg via ORAL
  Filled 2019-06-06 (×2): qty 1

## 2019-06-06 MED ORDER — SODIUM CHLORIDE 0.9% IV SOLUTION
Freq: Once | INTRAVENOUS | Status: AC
  Filled 2019-06-06: qty 250

## 2019-06-06 MED ORDER — FOLIC ACID 1 MG PO TABS
1.0000 mg | ORAL_TABLET | Freq: Every day | ORAL | Status: DC
  Administered 2019-06-06 – 2019-06-07 (×2): 1 mg via ORAL
  Filled 2019-06-06 (×3): qty 1

## 2019-06-06 NOTE — ED Notes (Addendum)
Pt's O2 de-sat's to 80%-89% when conversating. Pt was placed on 2LNC. Pt's oxygen is now 92%

## 2019-06-06 NOTE — Progress Notes (Signed)
43 y.o. female with past medical history of GERD and hypertension, who was hospitalized in September 2020 with symptomatic anemia requiring blood transfusion and negative upper and lower endoscopy at that time who returns to the emergency room with a several day history of weakness, associated with lightheadedness, dyspnea on exertion.  Denies orthopnea or PND and chest pain or palpitations.  Cough fever or chills. Patient  Denies hematemesis, black or bloody stool. She denies taking NSAIDS but admits to alcohol use, drinking 25oz beer every night.  In the emergency room she was mildly tachycardic with a heart rate of 115 with fair blood pressure of 148/60.  Blood work revealed hemoglobin of 6.8 but was for the most part otherwise unremarkable. Started on blood transfusions in the emergency room and hospitalization requested.  Subjective: No obvious bleeding, hematochezia, melena, black tarry stool or coffee-ground emesis while in the emergency.  Patient was receiving second unit of PRBC.  Objective: Vital signs in last 24 hours: Temp:  [97.6 F (36.4 C)-98.8 F (37.1 C)] 98.6 F (37 C) (12/16 1555) Pulse Rate:  [83-110] 87 (12/16 1546) Resp:  [13-22] 18 (12/16 1546) BP: (114-155)/(46-87) 123/47 (12/16 1546) SpO2:  [81 %-100 %] 98 % (12/16 1546)  Intake/Output from previous day: 12/15 0701 - 12/16 0700 In: 546.5  Out: -  Intake/Output this shift: Total I/O In: 370 [I.V.:50; Blood:320] Out: -   ENMT: Mucous membranes are moist. Posterior pharynx clear of any exudate or lesions.Normal dentition.  Neck: normal, supple, no masses, no thyromegaly Respiratory: clear to auscultation bilaterally, no wheezing, no crackles. Normal respiratory effort. No accessory muscle use.  Cardiovascular: Mild tachycardia, no murmurs / rubs / gallops. No extremity edema. 2+ pedal pulses. No carotid bruits.  Abdomen: no tenderness, no masses palpated. No hepatosplenomegaly. Bowel sounds positive.   Musculoskeletal: no clubbing / cyanosis. No joint deformity upper and lower extremities. Good ROM, no contractures. Normal muscle tone.  Skin: no rashes, lesions, ulcers. No induration Neurologic: CN 2-12 grossly intact.strength 4/5 on right. Speech clear Psychiatric: Normal judgment and insight. Alert and oriented x 3. Normal mood.   Results for orders placed or performed during the hospital encounter of 06/05/19 (from the past 24 hour(s))  Urinalysis, Complete w Microscopic     Status: Abnormal   Collection Time: 06/05/19  7:43 PM  Result Value Ref Range   Color, Urine YELLOW (A) YELLOW   APPearance CLEAR (A) CLEAR   Specific Gravity, Urine 1.008 1.005 - 1.030   pH 6.0 5.0 - 8.0   Glucose, UA NEGATIVE NEGATIVE mg/dL   Hgb urine dipstick SMALL (A) NEGATIVE   Bilirubin Urine NEGATIVE NEGATIVE   Ketones, ur NEGATIVE NEGATIVE mg/dL   Protein, ur NEGATIVE NEGATIVE mg/dL   Nitrite NEGATIVE NEGATIVE   Leukocytes,Ua LARGE (A) NEGATIVE   RBC / HPF 0-5 0 - 5 RBC/hpf   WBC, UA 6-10 0 - 5 WBC/hpf   Bacteria, UA RARE (A) NONE SEEN   Squamous Epithelial / LPF 0-5 0 - 5  Type and screen     Status: None (Preliminary result)   Collection Time: 06/05/19  7:45 PM  Result Value Ref Range   ABO/RH(D) B POS    Antibody Screen NEG    Sample Expiration 06/08/2019,2359    Unit Number [id number]    Blood Component Type RED CELLS,LR    Unit division 00    Status of Unit ISSUED,FINAL    Transfusion Status OK TO TRANSFUSE    Crossmatch Result Compatible  Unit Number [id number]    Blood Component Type RED CELLS,LR    Unit division 00    Status of Unit ISSUED    Transfusion Status OK TO TRANSFUSE    Crossmatch Result      Compatible Performed at [facility], [address] [place], [place] [address]   Vitamin B12     Status: Abnormal   Collection Time: 06/05/19  7:45 PM  Result Value Ref Range   Vitamin B-12 76 (L) 180 - 914 pg/mL  Prepare RBC     Status: None   Collection  Time: 06/05/19  7:54 PM  Result Value Ref Range   Order Confirmation      ORDER PROCESSED BY BLOOD BANK Performed at [facility], [address] [place], [place] [address]   Folate     Status: Abnormal   Collection Time: 06/05/19  8:03 PM  Result Value Ref Range   Folate 1.6 (L) >5.9 ng/mL  Iron and TIBC     Status: Abnormal   Collection Time: 06/05/19  8:03 PM  Result Value Ref Range   Iron 365 (H) 28 - 170 ug/dL   TIBC 387 250 - 450 ug/dL   Saturation Ratios 94 (H) 10.4 - 31.8 %   UIBC 22 ug/dL  Ferritin (Iron Binding Protein)     Status: None   Collection Time: 06/05/19  8:03 PM  Result Value Ref Range   Ferritin 205 11 - 307 ng/mL  SARS CORONAVIRUS 2 (TAT 6-24 HRS) Nasopharyngeal Nasopharyngeal Swab     Status: None   Collection Time: 06/05/19  8:04 PM   Specimen: Nasopharyngeal Swab  Result Value Ref Range   SARS Coronavirus 2 NEGATIVE NEGATIVE  Hemoglobin and hematocrit, blood     Status: Abnormal   Collection Time: 06/06/19  2:00 AM  Result Value Ref Range   Hemoglobin 7.4 (L) 12.0 - 15.0 g/dL   HCT 21.9 (L) 36.0 - 46.0 %  Prepare RBC     Status: None   Collection Time: 06/06/19  9:20 AM  Result Value Ref Range   Order Confirmation      ORDER PROCESSED BY BLOOD BANK Performed at [facility], [address] [place], [place] [address]   Hemoglobin and hematocrit, blood     Status: Abnormal   Collection Time: 06/06/19  2:57 PM  Result Value Ref Range   Hemoglobin 9.4 (L) 12.0 - 15.0 g/dL   HCT 27.2 (L) 36.0 - 46.0 %    Studies/Results: No results found.  Scheduled Meds: . folic acid  1 mg Oral Daily  . multivitamin with minerals  1 tablet Oral Daily  . nicotine  21 mg Transdermal Daily  . pantoprazole  40 mg Oral Daily  . sodium chloride flush  3 mL Intravenous Once  . thiamine  100 mg Oral Daily  . vitamin B-12  1,000 mcg Oral Daily   Continuous Infusions: PRN Meds:acetaminophen **OR** acetaminophen, ondansetron **OR** ondansetron  (ZOFRAN) IV, senna-docusate  Assessment/Plan: Symptomatic anemia, recurrent ---  Status post units of PRBC on 06/06/2019; posttransfusion hemoglobin came back at above 9 ---Etiology of anemia remains uncertain -However her B12 and folate found to be low.  Started p.o. supplementation ---  iron studies essentially normal for IDA --- Hospitalized on 03/16/2019 with hemoglobin of 5 and had a positive stool guaiac at the time  ---patient had both upper and lower endoscopy on 03/16/2019 another episode of bleeding.  Upper endoscopy revealed reflux esophagitis and colonoscopy revealed  nonbleeding internal hemorrhoids ---  FOBT came back negative. ---   Will recheck H&H.  If drops significantly may consult GI and/or hematology service     Essential hypertension --- Controlled.  Continue home medication monitor for hypertension  Alcohol abuse ---patient drinks a 25oz beer nightly --- Counseled on cessation -Signs or symptoms of intoxication or withdrawal  Nicotine dependence -counseled on cessation  DVT prophylaxis with SCDs   LOS: 0 days   Marveline Profeta Izetta Dakin

## 2019-06-06 NOTE — ED Notes (Addendum)
Attempted to obtain 2nd unit of blood per MD request. Per Blood Bank, there was no order placed. This information was relayed over to NP Ouma, who will reach out to admitting. No new orders at this time.

## 2019-06-06 NOTE — ED Notes (Signed)
Gave pt food tray. 

## 2019-06-06 NOTE — ED Notes (Signed)
Stool is hemoccult negative.

## 2019-06-06 NOTE — ED Notes (Signed)
Chart label sent, unable to start [facility] "do not have permission"

## 2019-06-07 DIAGNOSIS — F1020 Alcohol dependence, uncomplicated: Secondary | ICD-10-CM

## 2019-06-07 DIAGNOSIS — D519 Vitamin B12 deficiency anemia, unspecified: Secondary | ICD-10-CM

## 2019-06-07 LAB — BPAM RBC
Blood Product Expiration Date: 202012222359
Blood Product Expiration Date: 202012232359
ISSUE DATE / TIME: 202012152105
ISSUE DATE / TIME: 202012161032
Unit Type and Rh: 1700
Unit Type and Rh: 7300

## 2019-06-07 LAB — TYPE AND SCREEN
ABO/RH(D): B POS
Antibody Screen: NEGATIVE
Unit division: 0
Unit division: 0

## 2019-06-07 LAB — HEMOGLOBIN AND HEMATOCRIT, BLOOD
HCT: 25.7 % — ABNORMAL LOW (ref 36.0–46.0)
Hemoglobin: 9 g/dL — ABNORMAL LOW (ref 12.0–15.0)

## 2019-06-07 MED ORDER — VITAMIN B-12 1000 MCG PO TABS
2000.0000 ug | ORAL_TABLET | Freq: Every day | ORAL | Status: DC
  Administered 2019-06-07: 2000 ug via ORAL
  Filled 2019-06-07: qty 2

## 2019-06-07 MED ORDER — NICOTINE 21 MG/24HR TD PT24: 21.0000 mg | MEDICATED_PATCH | Freq: Every day | TRANSDERMAL | 3 refills | Status: DC

## 2019-06-07 MED ORDER — ADULT MULTIVITAMIN W/MINERALS CH: 1.0000 | ORAL_TABLET | Freq: Every day | ORAL | 0 refills | Status: DC

## 2019-06-07 MED ORDER — THIAMINE HCL 100 MG PO TABS: 100.0000 mg | ORAL_TABLET | Freq: Every day | ORAL | 0 refills | Status: DC

## 2019-06-07 MED ORDER — FOLIC ACID 1 MG PO TABS: 1.0000 mg | ORAL_TABLET | Freq: Every day | ORAL | 0 refills | Status: DC

## 2019-06-07 MED ORDER — CYANOCOBALAMIN 2000 MCG PO TABS: 2000.0000 ug | ORAL_TABLET | Freq: Every day | ORAL | 0 refills | Status: DC

## 2019-06-07 NOTE — Discharge Summary (Signed)
Physician Discharge Summary  Patient ID: Joanna ETCITTY MRN: [id number] DOB/AGE: 41-Dec-1977 41 y.o.  Admit date: 06/05/2019 Discharge date: 06/07/2019  Admission Diagnoses: Symptomatic anemia, recurrent Discharge Diagnoses:  Principal Problem:   Symptomatic anemia Active Problems:   Essential hypertension   UTI (urinary tract infection)   Discharged Condition: fair  Hospital Course:  43 y.o.femalewithpast medical history of GERD and hypertension, who was hospitalized in September 2020 with symptomatic anemia requiring blood transfusion and negative upper and lower endoscopy at that time who returns to the emergency room with a several day history of weakness,associated with lightheadedness, dyspnea on exertion. Denies orthopnea or PND and chest pain or palpitations. Cough fever or chills. PatientDenies hematemesis, black or bloody stool. She denies taking NSAIDS but admits to alcohol use, drinking 25oz beer every night.In the emergency room she was mildly tachycardic with a heart rate of 115 with fair blood pressure of 148/60. Blood work revealed hemoglobin of 6.8 but was for the most part otherwise unremarkable. Started on blood transfusions in the emergency room and hospitalization requested.  Symptomatic anemia, recurrent --- Status post 2 units of PRBC on 06/06/2019; posttransfusion hemoglobin came back at above 9 ---Etiology of anemia at this point appears to be due to 123456 and folic acid deficiency -  B12 and folate found to be low.  Started p.o. supplementation.  It is unclear why patient is deficient on 123456 and folic acid.  She said she eats meat regularly and vegetables.  And never anemic up until more than a year ago. -Blood intrinsic factor antibody is still pending --- iron studies essentially normal for IDA ---Hospitalized on 03/16/2019 with hemoglobin of 5. patient had both upper and lower endoscopy on 03/16/2019.Upper endoscopy revealed reflux esophagitis and  colonoscopy revealed nonbleeding internal hemorrhoids --- FOBT came back negative this admission -Consulted gastroenterology.  Agreed with vitamin supplementation.  Also recommended patient to follow-up with GI outpatient mostly for further work-up.  Patient was given a GI appointment prior to discharge.  GI does not recommended endoscopic intervention at this time. -Advised patient to follow-up with PCP closely  Essential hypertension ---Controlled. Home medication  Alcohol abuse ---patient drinks a 25oz beer nightly --- Counseled on cessation -Rx for 123456 folic thiamine and multivitamin given  Nicotine dependence -counseled on cessation -Nicotine patch Rx given  Consults: GI  Significant Diagnostic Studies: Labs and imagings  Treatments: Per hospital course and discharge med list  Discharge Exam: Blood pressure (!) 144/69, pulse 79, temperature 97.7 F (36.5 C), temperature source Oral, resp. rate 18, height 5\' 2"  (1.575 m), weight 71.2 kg, last menstrual period 08/28/2017, SpO2 97 %.  ENMT:Mucous membranes are moist. Posterior pharynx clear of any exudate or lesions.Normal dentition.  Neck:normal, supple, no masses, no thyromegaly Respiratory:clear to auscultation bilaterally, no wheezing, no crackles. Normal respiratory effort. No accessory muscle use.  Cardiovascular:Mild tachycardia,no murmurs / rubs / gallops. No extremity edema. 2+ pedal pulses. No carotid bruits.  Abdomen:no tenderness, no masses palpated. No hepatosplenomegaly. Bowel sounds positive.  Musculoskeletal:no clubbing / cyanosis. No joint deformity upper and lower extremities. Good ROM, no contractures. Normal muscle tone.  Skin:no rashes, lesions, ulcers. No induration Neurologic:CN 2-12 grossly intact.strength 4/5 on right. Speech clear Psychiatric:Normal judgment and insight. Alert and oriented x 3. Normal mood.   Disposition: Discharge disposition: 01-Home or Self Care     Was  deemed stable enough to be discharged home with close follow-up instruction with PCP and outpatient GI.  Patient was also given GI outpatient appointment. Warning  signs or symptoms explained to patient when she must seek immediate medical attention.  Patient expressed understanding.  Discharge Instructions    Call MD for:  difficulty breathing, headache or visual disturbances   Complete by: As directed    Diet - low sodium heart healthy   Complete by: As directed    Increase activity slowly   Complete by: As directed      Allergies as of 06/07/2019   No Known Allergies     Medication List    TAKE these medications   cyanocobalamin 2000 MCG tablet Take 1 tablet (2,000 mcg total) by mouth daily. Start taking on: December 18, [id number]   folic acid 1 MG tablet Commonly known as: FOLVITE Take 1 tablet (1 mg total) by mouth daily. Start taking on: June 08, 2019   gabapentin 300 MG capsule Commonly known as: NEURONTIN Take 300 mg by mouth 3 (three) times daily.   lisinopril 2.5 MG tablet Commonly known as: ZESTRIL Take 1 tablet (2.5 mg total) by mouth daily.   multivitamin with minerals Tabs tablet Take 1 tablet by mouth daily. Start taking on: June 08, 2019   nicotine 21 mg/24hr patch Commonly known as: NICODERM CQ - dosed in mg/24 hours Place 1 patch (21 mg total) onto the skin daily.   omeprazole 40 MG capsule Commonly known as: PRILOSEC Take 1 capsule (40 mg total) by mouth daily. What changed: when to take this   thiamine 100 MG tablet Take 1 tablet (100 mg total) by mouth daily. Start taking on: June 08, 2019        Signed: Thornell Mule 06/07/2019, 4:33 PM

## 2019-06-07 NOTE — Plan of Care (Signed)

## 2019-06-07 NOTE — Consult Note (Signed)
Cephas Darby, MD [address]  [place]  [place], [place] [address]  Main: [phone]  Fax: [phone] Pager: [phone]   Consultation  Referring Provider:     No ref. provider found Primary Care Physician:  Letta Median, MD Primary Gastroenterologist:  Dr. Alice Reichert       Reason for Consultation: Symptomatic anemia  Date of Admission:  06/05/2019 Date of Consultation:  06/07/2019         HPI:   Joanna Garcia is a 43 y.o. female known history of severe microcytic anemia, admitted on 03/14/2019 to [facility] with hemoglobin of 4 and MCV 125.3.  At that time, Dr. Alice Reichert was consulted, underwent EGD and colonoscopy which revealed mild esophagitis only.  Gastric and duodenal biopsies were not performed at that time.  She was found to have normal folate levels.  Unknown B12 and iron levels at that time. She was discharged home on PPI.  She was supposed to follow-up with Dr. Alice Reichert after discharge.  I did not see any outpatient notes on care everywhere.  She came to ER on 05/22/2019 for blood transfusion as she was found to have hemoglobin of 7.9.  She has not received any blood transfusion as she was hemodynamically stable.  Today, she returns with generalized weakness and hemoglobin 7.1 associated with nausea.  Repeat hemoglobin was 6.8, MCV 107.5.  Therefore, GI is consulted for further evaluation.  Work-up thus far revealed normal iron panel, severe B12 and folate deficiency of 76 and 1.6 respectively Patient received 2 units of PRBCs and responded appropriately to 9 today.  Pt acknowledges drinking 24 ounces of flavored beers every night last several years.  She eats only 1 meal a day.  She denies melena, nausea, vomiting, abdominal pain, rectal bleeding, hematemesis She takes omeprazole daily She smokes half a pack of cigarettes daily She also reports having tingling and numbness in her bilateral hands and feet Feeling better after blood transfusion.  NSAIDs:  None  Antiplts/Anticoagulants/Anti thrombotics: None  GI Procedures: EGD and colonoscopy 03/16/2019 - LA Grade A reflux esophagitis. - 1 cm hiatal hernia. - Normal examined duodenum. - The examination was otherwise normal. - No specimens collected.  - Internal hemorrhoids that prolapse with straining, but spontaneously regress to the resting position (Grade II) found on perianal exam. - Diverticulosis in the sigmoid colon. - Non-bleeding internal hemorrhoids. - The examination was otherwise normal. - No specimens collected.   Past Medical History:  Diagnosis Date  . DVT (deep venous thrombosis) ([place])   . GERD (gastroesophageal reflux disease)   . Hypertension   . Polyneuropathy     Past Surgical History:  Procedure Laterality Date  . COLONOSCOPY N/A 03/16/2019   Procedure: COLONOSCOPY;  Surgeon: Toledo, Benay Pike, MD;  Location: [facility] ENDOSCOPY;  Service: Gastroenterology;  Laterality: N/A;  . ESOPHAGOGASTRODUODENOSCOPY N/A 03/16/2019   Procedure: ESOPHAGOGASTRODUODENOSCOPY (EGD);  Surgeon: Toledo, Benay Pike, MD;  Location: [facility] ENDOSCOPY;  Service: Gastroenterology;  Laterality: N/A;  . NO PAST SURGERIES      Prior to Admission medications   Medication Sig Start Date End Date Taking? Authorizing Provider  gabapentin (NEURONTIN) 300 MG capsule Take 300 mg by mouth 3 (three) times daily.   Yes [provider]  lisinopril (ZESTRIL) 2.5 MG tablet Take 1 tablet (2.5 mg total) by mouth daily. 03/16/19  Yes Gouru, Illene Silver, MD  omeprazole (PRILOSEC) 40 MG capsule Take 1 capsule (40 mg total) by mouth daily. Patient taking differently: Take 40 mg  by mouth 2 (two) times daily.  03/16/19  Yes Gouru, Illene Silver, MD    Current Facility-Administered Medications:  .  acetaminophen (TYLENOL) tablet 650 mg, 650 mg, Oral, Q6H PRN **OR** acetaminophen (TYLENOL) suppository 650 mg, 650 mg, Rectal, Q6H PRN, Athena Masse, MD .  folic acid (FOLVITE) tablet 1 mg, 1 mg, Oral, Daily, Thornell Mule, MD, 1 mg at 06/07/19 0630 .  multivitamin with minerals tablet 1 tablet, 1 tablet, Oral, Daily, Thornell Mule, MD, 1 tablet at 06/07/19 [phone] .  nicotine (NICODERM CQ - dosed in mg/24 hours) patch 21 mg, 21 mg, Transdermal, Daily, Judd Gaudier V, MD, 21 mg at 06/06/19 2152 .  ondansetron (ZOFRAN) tablet 4 mg, 4 mg, Oral, Q6H PRN **OR** ondansetron (ZOFRAN) injection 4 mg, 4 mg, Intravenous, Q6H PRN, Athena Masse, MD .  pantoprazole (PROTONIX) EC tablet 40 mg, 40 mg, Oral, Daily, Thornell Mule, MD, 40 mg at 06/07/19 0952 .  senna-docusate (Senokot-S) tablet 1 tablet, 1 tablet, Oral, QHS PRN, Judd Gaudier V, MD .  sodium chloride flush (NS) 0.9 % injection 3 mL, 3 mL, Intravenous, Once, Carrie Mew, MD .  thiamine tablet 100 mg, 100 mg, Oral, Daily, Thornell Mule, MD, 100 mg at 06/07/19 0953 .  vitamin B-12 (CYANOCOBALAMIN) tablet 2,000 mcg, 2,000 mcg, Oral, Daily, Thornell Mule, MD, 2,000 mcg at 06/07/19 1003  Family History  Problem Relation Age of Onset  . Hypertension Mother   . Hyperlipidemia Mother   . Heart disease Mother   . COPD Mother   . Hypertension Father   . Hyperlipidemia Father   . Cancer Father        prostate     Social History   Tobacco Use  . Smoking status: Current Every Day Smoker    Packs/day: 0.50  . Smokeless tobacco: Never Used  Substance Use Topics  . Alcohol use: Yes    Comment: 21 Oz daily  . Drug use: Not Currently    Types: Marijuana    Allergies as of 06/05/2019  . (No Known Allergies)    Review of Systems:    All systems reviewed and negative except where noted in HPI.   Physical Exam:  Vital signs in last 24 hours: Temp:  [97.4 F (36.3 C)-98.6 F (37 C)] 97.7 F (36.5 C) (12/17 0109) Pulse Rate:  [76-88] 79 (12/17 0831) Resp:  [18] 18 (12/17 0831) BP: (123-159)/(47-87) 144/69 (12/17 0831) SpO2:  [93 %-99 %] 97 % (12/17 0831) Last BM Date: 06/07/19 General:   Pleasant, cooperative in NAD Head:  Normocephalic  and atraumatic. Eyes:   No icterus.   Conjunctiva pale. PERRLA. Ears:  Normal auditory acuity. Neck:  Supple; no masses or thyroidomegaly Lungs: Respirations even and unlabored. Lungs clear to auscultation bilaterally.   No wheezes, crackles, or rhonchi.  Heart:  Regular rate and rhythm;  Without murmur, clicks, rubs or gallops Abdomen:  Soft, nondistended, nontender. Normal bowel sounds. No appreciable masses or hepatomegaly.  No rebound or guarding.  Rectal:  Not performed. Msk:  Symmetrical without gross deformities.  Strength generalized weakness Extremities:  Without edema, cyanosis or clubbing. Neurologic:  Alert and oriented x3;  grossly normal neurologically. Skin:  Intact without significant lesions or rashes. Psych:  Alert and cooperative. Normal affect.  LAB RESULTS: CBC Latest Ref Rng & Units 06/07/2019 06/06/2019 06/06/2019  WBC 4.0 - 10.5 K/uL - - -  Hemoglobin 12.0 - 15.0 g/dL 9.0(L) 9.4(L) 7.4(L)  Hematocrit 36.0 - 46.0 % 25.7(L) 27.2(L) 21.9(L)  Platelets 150 - 400 K/uL - - -    BMET BMP Latest Ref Rng & Units 06/05/2019 05/22/2019 03/16/2019  Glucose 70 - 99 mg/dL 99 111(H) 90  BUN 6 - 20 mg/dL 6 7 5(L)  Creatinine 0.44 - 1.00 mg/dL 0.61 0.53 0.37(L)  Sodium 135 - 145 mmol/L 136 135 137  Potassium 3.5 - 5.1 mmol/L 4.5 3.4(L) 3.1(L)  Chloride 98 - 111 mmol/L 101 100 106  CO2 22 - 32 mmol/L _0 Calcium 8.9 - 10.3 mg/dL 9.1 8.8(L) 8.1(L)    LFT Hepatic Function Latest Ref Rng & Units 05/22/2019 03/15/2019 03/14/2019  Total Protein 6.5 - 8.1 g/dL 6.6 5.7(L) 5.9(L)  Albumin 3.5 - 5.0 g/dL 4.0 3.3(L) 3.3(L)  AST 15 - 41 U/L 59(H) 61(H) 67(H)  ALT 0 - 44 U/L 40 49(H) 51(H)  Alk Phosphatase 38 - 126 U/L 94 67 64  Total Bilirubin 0.3 - 1.2 mg/dL 1.3(H) 1.3(H) 1.2  Bilirubin, Direct 0.0 - 0.2 mg/dL - 0.2 0.3(H)     STUDIES: No results found.    Impression / Plan:   Joanna Garcia is a 43 y.o. female with history of hypertension, known history of severe  macrocytic anemia admitted with severe symptomatic anemia.  Patient had bidirectional endoscopy with no source of bleeding identified.  She is found to have severe B12 and folate deficiency anemia.  She does not have iron deficiency although unclear if iron studies were drawn after blood transfusion during this admission.  History of heavy alcohol use, likely the etiology leading to bone marrow suppression as well as malnutrition  Severe macrocytic anemia secondary to B12 and folate deficiency from chronic alcohol use Agree with checking intrinsic factor antibodies Check antiparietal cell antibodies, H. pylori IgG, H. pylori breath test, celiac panel Replete folate and B12 Agree with oral folate 1 mg daily, vitamin B12 1000 to 2000 MCG daily She should follow-up with GI closely and monitor iron studies, B12 and folate levels in a month Do not recommend endoscopic intervention at this time  Thank you for involving me in the care of this patient.      LOS: 0 days   Sherri Sear, MD  06/07/2019, 11:00 AM   Note: This dictation was prepared with Dragon dictation along with smaller phrase technology. Any transcriptional errors that result from this process are unintentional.

## 2019-06-08 LAB — ANTI-PARIETAL ANTIBODY: Parietal Cell Antibody-IgG: 1.6 Units (ref 0.0–20.0)

## 2019-06-08 LAB — H PYLORI, IGM, IGG, IGA AB
H Pylori IgG: 0.23 Index Value (ref 0.00–0.79)
H. Pylogi, Iga Abs: <9 units (ref 0.0–8.9)
H. Pylogi, Igm Abs: <9 units (ref 0.0–8.9)

## 2019-06-08 LAB — INTRINSIC FACTOR ANTIBODIES: Intrinsic Factor: 0.9 AU/mL (ref 0.0–1.1)

## 2019-06-09 LAB — METHYLMALONIC ACID, SERUM: Methylmalonic Acid, Quantitative: 315 nmol/L (ref 0–378)

## 2019-06-09 LAB — TISSUE TRANSGLUTAMINASE, IGG: Tissue Transglut Ab: <2 U/mL (ref 0–5)

## 2019-06-09 LAB — CELIAC DISEASE PANEL
Endomysial Ab, IgA: NEGATIVE
IgA: 44 mg/dL — ABNORMAL LOW (ref 87–352)
Tissue Transglutaminase Ab, IgA: <2 U/mL (ref 0–3)

## 2019-06-11 LAB — H. PYLORI BREATH TEST: H. pylori UBiT: NEGATIVE

## 2020-09-15 IMAGING — DX DG CHEST 1V PORT
2 series · 2 of 2 positions shown · non-contrast
Comparison: Chest radiograph 03/14/2019, 03/03/2010

CLINICAL DATA: c/o of SHOB and abnormal labs.

EXAM:
PORTABLE CHEST 1 VIEW

[chest ap (1 of 2)]
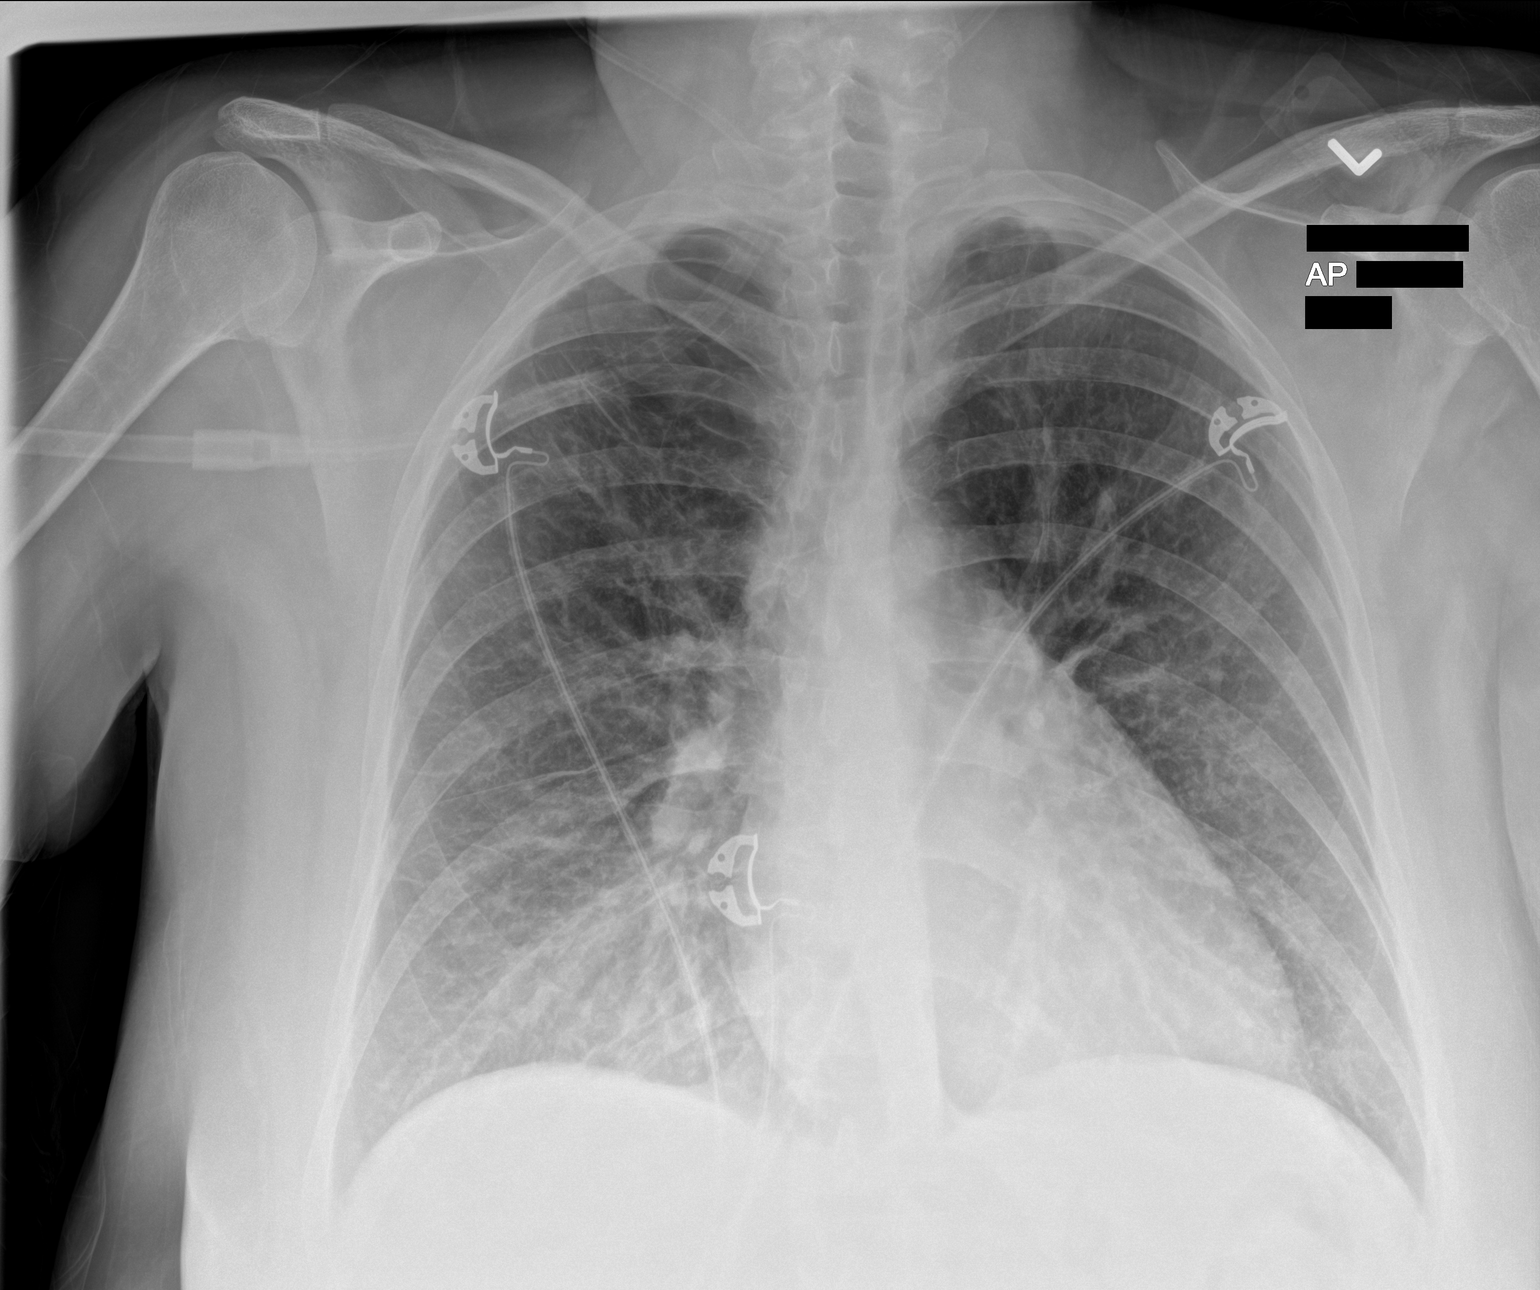

[chest ap (2 of 2)]
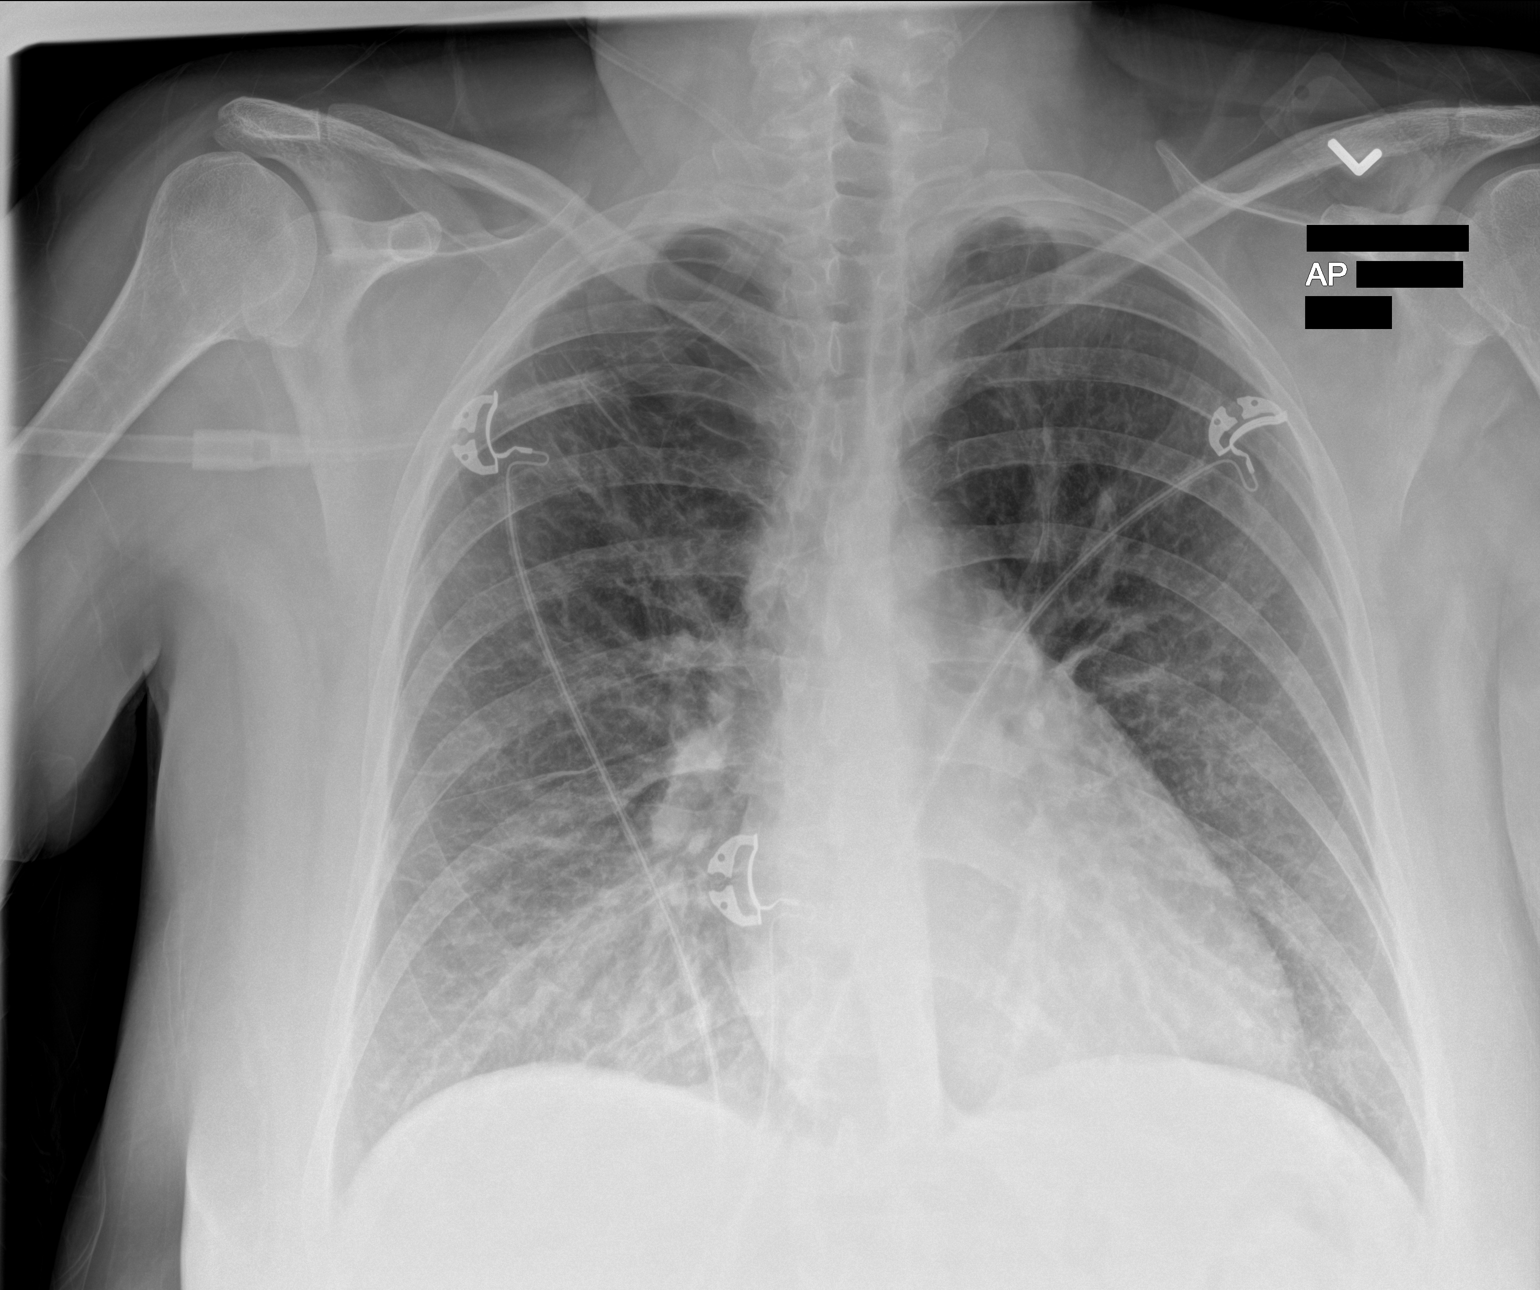

[2 of 2 positions shown; findings below may reference images not displayed]

FINDINGS: Stable cardiomediastinal contours. Lungs are mildly hyperinflated.
There are again bilateral interstitial opacities most pronounced in
the right lower lung. No pneumothorax or large pleural effusion. No
acute findings in the visualized skeleton.
IMPRESSION: Redemonstrated nonspecific bilateral interstitial markings most
prominent in the right lower lung which is nonspecific and could
represent atypical infection, atelectasis or asymmetric edema.

## 2021-01-09 ENCOUNTER — Inpatient Hospital Stay: Payer: Self-pay

## 2021-01-09 ENCOUNTER — Other Ambulatory Visit: Payer: Self-pay

## 2021-01-09 ENCOUNTER — Inpatient Hospital Stay: Payer: Self-pay | Attending: Oncology | Admitting: Oncology

## 2021-01-09 ENCOUNTER — Encounter: Payer: Self-pay | Admitting: Oncology

## 2021-01-09 VITALS — BP 150/102 | HR 90 | Temp 98.9°F | Resp 18 | Wt 148.8 lb

## 2021-01-09 DIAGNOSIS — D72829 Elevated white blood cell count, unspecified: Secondary | ICD-10-CM | POA: Insufficient documentation

## 2021-01-09 DIAGNOSIS — D582 Other hemoglobinopathies: Secondary | ICD-10-CM

## 2021-01-09 DIAGNOSIS — Z86718 Personal history of other venous thrombosis and embolism: Secondary | ICD-10-CM | POA: Insufficient documentation

## 2021-01-09 DIAGNOSIS — D751 Secondary polycythemia: Secondary | ICD-10-CM | POA: Insufficient documentation

## 2021-01-09 DIAGNOSIS — F17210 Nicotine dependence, cigarettes, uncomplicated: Secondary | ICD-10-CM | POA: Insufficient documentation

## 2021-01-09 DIAGNOSIS — Z79899 Other long term (current) drug therapy: Secondary | ICD-10-CM | POA: Insufficient documentation

## 2021-01-09 DIAGNOSIS — Z806 Family history of leukemia: Secondary | ICD-10-CM | POA: Insufficient documentation

## 2021-01-09 DIAGNOSIS — K219 Gastro-esophageal reflux disease without esophagitis: Secondary | ICD-10-CM | POA: Insufficient documentation

## 2021-01-09 LAB — CBC WITH DIFFERENTIAL/PLATELET
Abs Immature Granulocytes: 0.07 10*3/uL (ref 0.00–0.07)
Basophils Absolute: 0.1 10*3/uL (ref 0.0–0.1)
Basophils Relative: 1 %
Eosinophils Absolute: 0.2 10*3/uL (ref 0.0–0.5)
Eosinophils Relative: 1 %
HCT: 55.7 % — ABNORMAL HIGH (ref 36.0–46.0)
Hemoglobin: 19.5 g/dL — ABNORMAL HIGH (ref 12.0–15.0)
Immature Granulocytes: 0 %
Lymphocytes Relative: 19 %
Lymphs Abs: 3.1 10*3/uL (ref 0.7–4.0)
MCH: 37.1 pg — ABNORMAL HIGH (ref 26.0–34.0)
MCHC: 35 g/dL (ref 30.0–36.0)
MCV: 106.1 fL — ABNORMAL HIGH (ref 80.0–100.0)
Monocytes Absolute: 1 10*3/uL (ref 0.1–1.0)
Monocytes Relative: 6 %
Neutro Abs: 11.7 10*3/uL — ABNORMAL HIGH (ref 1.7–7.7)
Neutrophils Relative %: 73 %
Platelets: 217 10*3/uL (ref 150–400)
RBC: 5.25 MIL/uL — ABNORMAL HIGH (ref 3.87–5.11)
RDW: 15.7 % — ABNORMAL HIGH (ref 11.5–15.5)
WBC: 16.2 10*3/uL — ABNORMAL HIGH (ref 4.0–10.5)
nRBC: 0 % (ref 0.0–0.2)

## 2021-01-09 LAB — COMPREHENSIVE METABOLIC PANEL
ALT: 22 U/L (ref 0–44)
AST: 26 U/L (ref 15–41)
Albumin: 4.3 g/dL (ref 3.5–5.0)
Alkaline Phosphatase: 79 U/L (ref 38–126)
Anion gap: 12 (ref 5–15)
BUN: 10 mg/dL (ref 6–20)
CO2: 26 mmol/L (ref 22–32)
Calcium: 9.5 mg/dL (ref 8.9–10.3)
Chloride: 95 mmol/L — ABNORMAL LOW (ref 98–111)
Creatinine, Ser: 0.95 mg/dL (ref 0.44–1.00)
GFR, Estimated: >60 mL/min (ref >60)
Glucose, Bld: 108 mg/dL — ABNORMAL HIGH (ref 70–99)
Potassium: 3.4 mmol/L — ABNORMAL LOW (ref 3.5–5.1)
Sodium: 133 mmol/L — ABNORMAL LOW (ref 135–145)
Total Bilirubin: 0.6 mg/dL (ref 0.3–1.2)
Total Protein: 7.9 g/dL (ref 6.5–8.1)

## 2021-01-09 LAB — URINALYSIS, COMPLETE (UACMP) WITH MICROSCOPIC
Bilirubin Urine: NEGATIVE
Glucose, UA: NEGATIVE mg/dL
Hgb urine dipstick: NEGATIVE
Ketones, ur: NEGATIVE mg/dL
Leukocytes,Ua: NEGATIVE
Nitrite: NEGATIVE
Protein, ur: NEGATIVE mg/dL
Specific Gravity, Urine: 1.01 (ref 1.005–1.030)
pH: 6.5 (ref 5.0–8.0)

## 2021-01-09 LAB — TECHNOLOGIST SMEAR REVIEW: Plt Morphology: NORMAL

## 2021-01-09 NOTE — Progress Notes (Signed)
[place]  [phone] Fax:(336) [phone]  ID: Lily Kocher OB: 06-20-1976  MR#: [id number]  AJO#:[id number]  Patient Care Team: Letta Median, MD as PCP - General (Family Medicine)  CHIEF COMPLAINT: Polycythemia  INTERVAL HISTORY: Mrs. Mignogna is a 45 year old female with past medical history significant for superficial thrombophlebitis, hypertension, GERD, GI bleed, symptomatic anemia and thrombocytosis.  She was referred by [facility] for polycythemia.  Hemoglobin appears to be around 20 since January 2022.  She was hospitalized in September 2020 for symptomatic anemia with hemoglobin of 4 requiring blood transfusion with a negative upper and lower endoscopy at the time which showed hiatal hernia but no evidence of bleeding.  Her stool was positive for blood.  She was given 2 units of blood with improvement of her labs.  Over the next several months, she continued to be anemic and required 2 additional units of PRBC.  Nutritional work-up did show a folate level of 1.6 and a low B12 level.  She was started on multivitamin, iron and B12 supplements.  She had not had any trouble with her hemoglobin since.  She denies any hyperviscosity symptoms including chest or abdominal pain, myalgia and weakness, fatigue, headache and blurred vision or transient loss of vision.  Denies thrombus or bleeding at unusual sites.  Does have history of superficial thrombophlebitis.  She denies any recent fevers, night sweats, weight loss, pruritus, intense burning or pain in extremities, gout.  She is on  Lisinipril/HCTZ which could cause some volume depletion.  Denies any unexpected weight loss, hematuria or abdominal pelvic pain or fullness. Had U/s back in 2008 which did not show any issues with spleen/liver. She is a current everyday smoker and drinks alcohol daily.  Denies being diagnosed with chronic lung disease such as COPD or emphysema.  Has an  occasional cough.  Denies sleep apnea.  Denies the use of steroids.  No personal history of cancer.  Paternal uncle had leukemia.   REVIEW OF SYSTEMS:   Review of Systems  Constitutional:  Positive for malaise/fatigue. Negative for chills, fever and weight loss.  HENT:  Negative for congestion, ear pain and tinnitus.   Eyes: Negative.  Negative for blurred vision and double vision.  Respiratory:  Positive for shortness of breath. Negative for cough and sputum production.   Cardiovascular: Negative.  Negative for chest pain, palpitations and leg swelling.  Gastrointestinal: Negative.  Negative for abdominal pain, constipation, diarrhea, nausea and vomiting.  Genitourinary:  Negative for dysuria, frequency and urgency.  Musculoskeletal:  Negative for back pain and falls.  Skin: Negative.  Negative for rash.  Neurological:  Positive for headaches. Negative for weakness.  Endo/Heme/Allergies: Negative.  Does not bruise/bleed easily.  Psychiatric/Behavioral: Negative.  Negative for depression. The patient is not nervous/anxious and does not have insomnia.    As per HPI. Otherwise, a complete review of systems is negative.  PAST MEDICAL HISTORY: Past Medical History:  Diagnosis Date   DVT (deep venous thrombosis) (HCC)    GERD (gastroesophageal reflux disease)    Hypertension    Polyneuropathy     PAST SURGICAL HISTORY: Past Surgical History:  Procedure Laterality Date   COLONOSCOPY N/A 03/16/2019   Procedure: COLONOSCOPY;  Surgeon: Toledo, Benay Pike, MD;  Location: [facility]COPY;  Service: Gastroenterology;  Laterality: N/A;   ESOPHAGOGASTRODUODENOSCOPY N/A 03/16/2019   Procedure: ESOPHAGOGASTRODUODENOSCOPY (EGD);  Surgeon: Toledo, Benay Pike, MD;  Location: [facility] ENDOSCOPY;  Service: Gastroenterology;  Laterality: N/A;   NO PAST  SURGERIES      FAMILY HISTORY: Family History  Problem Relation Age of Onset   Hypertension Mother    Hyperlipidemia Mother    Heart disease Mother     COPD Mother    Hypertension Father    Hyperlipidemia Father    Cancer Father        prostate    ADVANCED DIRECTIVES (Y/N):  N  HEALTH MAINTENANCE: Social History   Tobacco Use   Smoking status: Every Day    Packs/day: 0.50    Types: Cigarettes   Smokeless tobacco: Never  Vaping Use   Vaping Use: Never used  Substance Use Topics   Alcohol use: Yes    Comment: 21 Oz daily   Drug use: Not Currently    Types: Marijuana     Colonoscopy:  PAP:  Bone density:  Lipid panel:  No Known Allergies  Current Outpatient Medications  Medication Sig Dispense Refill   folic acid (FOLVITE) 1 MG tablet Take 1 tablet (1 mg total) by mouth daily. 30 tablet 0   gabapentin (NEURONTIN) 300 MG capsule Take 300 mg by mouth 3 (three) times daily.     lisinopril (ZESTRIL) 2.5 MG tablet Take 1 tablet (2.5 mg total) by mouth daily. 30 tablet 0   Multiple Vitamin (MULTIVITAMIN WITH MINERALS) TABS tablet Take 1 tablet by mouth daily. 30 tablet 0   nicotine (NICODERM CQ - DOSED IN MG/24 HOURS) 21 mg/24hr patch Place 1 patch (21 mg total) onto the skin daily. 28 patch 3   omeprazole (PRILOSEC) 40 MG capsule Take 1 capsule (40 mg total) by mouth daily. (Patient taking differently: Take 40 mg by mouth 2 (two) times daily. ) 90 capsule 0   thiamine 100 MG tablet Take 1 tablet (100 mg total) by mouth daily. 30 tablet 0   vitamin B-12 2000 MCG tablet Take 1 tablet (2,000 mcg total) by mouth daily. 30 tablet 0   No current facility-administered medications for this visit.    OBJECTIVE: There were no vitals filed for this visit.   There is no height or weight on file to calculate BMI.    ECOG FS:0 - Asymptomatic  Physical Exam Constitutional:      Appearance: Normal appearance.  HENT:     Head: Normocephalic and atraumatic.     Mouth/Throat:     Comments: No cyanosis in the lips, earlobes and fingers or clubbing of the nailbeds.  Pulse oximetry is 94% on room air. Eyes:     Pupils: Pupils are equal,  round, and reactive to light.  Cardiovascular:     Rate and Rhythm: Normal rate and regular rhythm.     Heart sounds: Normal heart sounds. No murmur heard. Pulmonary:     Effort: Pulmonary effort is normal.     Breath sounds: Normal breath sounds. No wheezing.  Abdominal:     General: Bowel sounds are normal. There is no distension.     Palpations: Abdomen is soft. There is no hepatomegaly or splenomegaly.     Tenderness: There is no abdominal tenderness.  Musculoskeletal:        General: Normal range of motion.     Cervical back: Normal range of motion.  Skin:    General: Skin is warm and dry.     Findings: No rash.  Neurological:     Mental Status: She is alert and oriented to person, place, and time.  Psychiatric:        Judgment: Judgment normal.  LAB RESULTS:  Lab Results  Component Value Date   NA 136 06/05/2019   K 4.5 06/05/2019   CL 101 06/05/2019   CO2 23 06/05/2019   GLUCOSE 99 06/05/2019   BUN 6 06/05/2019   CREATININE 0.61 06/05/2019   CALCIUM 9.1 06/05/2019   PROT 6.6 05/22/2019   ALBUMIN 4.0 05/22/2019   AST 59 (H) 05/22/2019   ALT 40 05/22/2019   ALKPHOS 94 05/22/2019   BILITOT 1.3 (H) 05/22/2019   GFRNONAA >60 06/05/2019   GFRAA >60 06/05/2019    Lab Results  Component Value Date   WBC 6.6 06/05/2019   NEUTROABS 9.4 (H) 03/14/2019   HGB 9.0 (L) 06/07/2019   HCT 25.7 (L) 06/07/2019   MCV 107.1 (H) 06/05/2019   PLT 222 06/05/2019     STUDIES: No results found.  ASSESSMENT: Mrs. Monica is a 45 year old female who presents for work-up for an elevated hemoglobin.  PLAN:    Polycythemia: Likely secondary due to smoking, chronic lung disease and volume depleting medication. No evidence of hypoxia-O2 saturations 94%. She continues to smoke 1 pack of cigarettes per day. She feels symptomatic with headaches, occasional dizziness, early CAD and fatigue. She has stopped all of her nutritional supplements. Plan to repeat lab work today  including CBC, CMP, EPO and peripheral smear.   Will get a urinalysis to rule out hematuria. JAK2 to rule out polycythemia vera.  Leukocytosis: Discovered incidentally on lab work. WBC is 16.2.  Differential shows neutrophilia (11.7).  No active infection or fever. Could be secondary to smoking or some underlying liver disease. At her next visit can add on ESR and CRP for chronic inflammation and flow cytometry to rule out clonal disorder such as CML, polycythemia vera and ET.  GERD: Currently on omeprazole.  History of GI bleed/ulcer: She was hospitalized back in September 2020 with a hemoglobin of 4. She required several units of packed red blood cells. Colonoscopy and EGD were negative for bleeding source. She was noted to be taking several BC powders.  No recurrence of GI bleed.  Disposition: RTC in 2 to 3 weeks to discuss results.  We will add on ESR and CRP for chronic inflammation and flow cytometry at that time. If JAK2 is negative recommend blood donation or therapeutic phlebotomy given her symptoms.  Patient expressed understanding and was in agreement with this plan. She also understands that She can call clinic at any time with any questions, concerns, or complaints.   Cancer Staging No matching staging information was found for the patient.  Jacquelin Hawking, NP   01/09/2021 1:15 PM

## 2021-01-09 NOTE — Progress Notes (Signed)
New pt referred for polycythemia.

## 2021-01-10 LAB — ERYTHROPOIETIN: Erythropoietin: 4.9 m[IU]/mL (ref 2.6–18.5)

## 2021-01-20 LAB — JAK2  V617F QUAL. WITH REFLEX TO EXON 12: Reflex:: 15

## 2021-01-23 ENCOUNTER — Encounter: Payer: Self-pay | Admitting: Nurse Practitioner

## 2021-01-23 ENCOUNTER — Inpatient Hospital Stay: Payer: Self-pay

## 2021-01-23 ENCOUNTER — Inpatient Hospital Stay: Payer: Self-pay | Attending: Nurse Practitioner | Admitting: Nurse Practitioner

## 2021-01-23 ENCOUNTER — Other Ambulatory Visit: Payer: Self-pay

## 2021-01-23 VITALS — BP 105/86 | HR 104 | Temp 98.3°F | Resp 16 | Wt 148.6 lb

## 2021-01-23 DIAGNOSIS — Z809 Family history of malignant neoplasm, unspecified: Secondary | ICD-10-CM | POA: Insufficient documentation

## 2021-01-23 DIAGNOSIS — Z8042 Family history of malignant neoplasm of prostate: Secondary | ICD-10-CM | POA: Insufficient documentation

## 2021-01-23 DIAGNOSIS — Z806 Family history of leukemia: Secondary | ICD-10-CM | POA: Insufficient documentation

## 2021-01-23 DIAGNOSIS — D582 Other hemoglobinopathies: Secondary | ICD-10-CM

## 2021-01-23 DIAGNOSIS — Z803 Family history of malignant neoplasm of breast: Secondary | ICD-10-CM | POA: Insufficient documentation

## 2021-01-23 DIAGNOSIS — F17210 Nicotine dependence, cigarettes, uncomplicated: Secondary | ICD-10-CM | POA: Insufficient documentation

## 2021-01-23 DIAGNOSIS — Z8719 Personal history of other diseases of the digestive system: Secondary | ICD-10-CM | POA: Insufficient documentation

## 2021-01-23 DIAGNOSIS — Z86718 Personal history of other venous thrombosis and embolism: Secondary | ICD-10-CM | POA: Insufficient documentation

## 2021-01-23 DIAGNOSIS — I10 Essential (primary) hypertension: Secondary | ICD-10-CM | POA: Insufficient documentation

## 2021-01-23 DIAGNOSIS — D72829 Elevated white blood cell count, unspecified: Secondary | ICD-10-CM | POA: Insufficient documentation

## 2021-01-23 DIAGNOSIS — D751 Secondary polycythemia: Secondary | ICD-10-CM | POA: Insufficient documentation

## 2021-01-23 LAB — CBC WITH DIFFERENTIAL/PLATELET
Abs Immature Granulocytes: 0.06 10*3/uL (ref 0.00–0.07)
Basophils Absolute: 0.1 10*3/uL (ref 0.0–0.1)
Basophils Relative: 1 %
Eosinophils Absolute: 0.2 10*3/uL (ref 0.0–0.5)
Eosinophils Relative: 1 %
HCT: 54.6 % — ABNORMAL HIGH (ref 36.0–46.0)
Hemoglobin: 19.2 g/dL — ABNORMAL HIGH (ref 12.0–15.0)
Immature Granulocytes: 0 %
Lymphocytes Relative: 19 %
Lymphs Abs: 2.7 10*3/uL (ref 0.7–4.0)
MCH: 37.6 pg — ABNORMAL HIGH (ref 26.0–34.0)
MCHC: 35.2 g/dL (ref 30.0–36.0)
MCV: 106.8 fL — ABNORMAL HIGH (ref 80.0–100.0)
Monocytes Absolute: 0.8 10*3/uL (ref 0.1–1.0)
Monocytes Relative: 6 %
Neutro Abs: 10.4 10*3/uL — ABNORMAL HIGH (ref 1.7–7.7)
Neutrophils Relative %: 73 %
Platelets: 208 10*3/uL (ref 150–400)
RBC: 5.11 MIL/uL (ref 3.87–5.11)
RDW: 15.5 % (ref 11.5–15.5)
WBC: 14.3 10*3/uL — ABNORMAL HIGH (ref 4.0–10.5)
nRBC: 0 % (ref 0.0–0.2)

## 2021-01-23 LAB — C-REACTIVE PROTEIN: CRP: 0.6 mg/dL (ref <1.0)

## 2021-01-23 LAB — SEDIMENTATION RATE: Sed Rate: 1 mm/hr (ref 0–20)

## 2021-01-23 NOTE — Progress Notes (Signed)
Pt states she has been having trouble breathing for the past six months has to take deep breaths often according to pt.As well as feeling tired all of the time. Pt states she is already cutting her BP meds in half and BP still seems to be very low.

## 2021-01-23 NOTE — Progress Notes (Signed)
[facility], [place], [place], [place] [address] [phone] [phone]) [phone]  ID: Joanna Garcia OB: 1976/01/27  MR#: [id number]  [id number]  Patient Care Team: Letta Median, MD as PCP - General (Family Medicine)  CHIEF COMPLAINT: Erythrocytosis  HISTORY OF PRESENTING ILLNESS: Joanna Garcia is a 45 year old female with past medical history significant for superficial thrombophlebitis, hypertension, smoking, GERD, GI bleed, symptomatic anemia and thrombocytosis.  She was referred by [facility] for polycythemia.  Hemoglobin has been around 20 since January 2022.   She was hospitalized in September 2020 for symptomatic anemia with hemoglobin of 4 requiring blood transfusion with a negative upper and lower endoscopy at the time which showed hiatal hernia but no evidence of bleeding.  Her stool was positive for blood.  She was given 2 units of blood with improvement of her labs.  Over the next several months, she continued to be anemic and required 2 additional units of PRBC.  Nutritional work-up did show a folate level of 1.6 and a low B12 level.  She was started on multivitamin, iron and B12 supplements.    Interval History: Patient is 45 year old female who returns to clinic for labs and further evaluation and management of erythrocytosis. Complains of fatigue. No abdominal pain or early satiety. Has painful fingers and toes and was told she has neuropathy by her pcp. No abdominal pain, early satiety. Denies inactivity, impaired concentration. No itching or night sweats. No fevers or chills. No unintentional weight loss. No diffuse bone pain. No interval blood clots. Denies other specific complaints. Continues to smoke.    REVIEW OF SYSTEMS:   Review of Systems  Constitutional:  Positive for malaise/fatigue. Negative for chills, fever and weight loss.  HENT:  Negative for congestion, ear pain and tinnitus.   Eyes:  Negative.  Negative for blurred vision and double vision.  Respiratory:  Positive for shortness of breath. Negative for cough and sputum production.   Cardiovascular: Negative.  Negative for chest pain, palpitations and leg swelling.  Gastrointestinal: Negative.  Negative for abdominal pain, constipation, diarrhea, nausea and vomiting.  Genitourinary:  Negative for dysuria, frequency and urgency.  Musculoskeletal:  Negative for back pain and falls.  Skin: Negative.  Negative for rash.  Neurological:  Positive for headaches. Negative for weakness.  Endo/Heme/Allergies: Negative.  Does not bruise/bleed easily.  Psychiatric/Behavioral: Negative.  Negative for depression. The patient is not nervous/anxious and does not have insomnia.     PAST MEDICAL HISTORY: Past Medical History:  Diagnosis Date   DVT (deep venous thrombosis) (HCC)    GERD (gastroesophageal reflux disease)    Hypertension    Polyneuropathy     PAST SURGICAL HISTORY: Past Surgical History:  Procedure Laterality Date   COLONOSCOPY N/A 03/16/2019   Procedure: COLONOSCOPY;  Surgeon: Toledo, Benay Pike, MD;  Location: [facility];  Service: Gastroenterology;  Laterality: N/A;   ESOPHAGOGASTRODUODENOSCOPY N/A 03/16/2019   Procedure: ESOPHAGOGASTRODUODENOSCOPY (EGD);  Surgeon: Toledo, Benay Pike, MD;  Location: [facility];  Service: Gastroenterology;  Laterality: N/A;   NO PAST SURGERIES      FAMILY HISTORY: Family History  Problem Relation Age of Onset   Hypertension Mother    Hyperlipidemia Mother    Heart disease Mother    COPD Mother    Hypertension Father    Hyperlipidemia Father    Cancer Father        prostate   Leukemia Paternal Uncle    Cancer Paternal Grandmother  Breast cancer Paternal Grandmother     ADVANCED DIRECTIVES (Y/N):  N  HEALTH MAINTENANCE: Social History   Tobacco Use   Smoking status: Every Day    Packs/day: 1.00    Types: Cigarettes   Smokeless tobacco: Never  Vaping Use    Vaping Use: Never used  Substance Use Topics   Alcohol use: Yes    Comment: 21 Oz daily   Drug use: Not Currently    Types: Marijuana    Colonoscopy:  PAP:  Bone density:  Lipid panel:  No Known Allergies  Current Outpatient Medications  Medication Sig Dispense Refill   gabapentin (NEURONTIN) 300 MG capsule Take 300 mg by mouth 5 (five) times daily.     lisinopril-hydrochlorothiazide (ZESTORETIC) 10-12.5 MG tablet Take 1 tablet by mouth daily. Pt taking 1/2 tablet daily     omeprazole (PRILOSEC) 40 MG capsule Take 1 capsule (40 mg total) by mouth daily. (Patient taking differently: Take 40 mg by mouth 2 (two) times daily.) 90 capsule 0   No current facility-administered medications for this visit.    OBJECTIVE: Vitals:   01/23/21 1131  BP: 105/86  Pulse: (!) 104  Resp: 16  Temp: 98.3 F (36.8 C)  SpO2: 94%     Body mass index is 27.18 kg/m.    ECOG FS:0 - Asymptomatic  Physical Exam Constitutional:      General: She is not in acute distress.    Appearance: She is well-developed. She is not ill-appearing.  HENT:     Head: Atraumatic.     Nose: Nose normal.     Mouth/Throat:     Pharynx: No oropharyngeal exudate.  Eyes:     General: No scleral icterus.    Conjunctiva/sclera: Conjunctivae normal.  Cardiovascular:     Rate and Rhythm: Normal rate and regular rhythm.  Pulmonary:     Effort: Pulmonary effort is normal.     Breath sounds: Normal breath sounds.  Abdominal:     General: Bowel sounds are normal. There is no distension.     Palpations: Abdomen is soft.     Tenderness: There is no abdominal tenderness.  Musculoskeletal:        General: No tenderness or deformity. Normal range of motion.     Cervical back: Normal range of motion and neck supple.  Skin:    General: Skin is warm and dry.     Coloration: Skin is not pale.  Neurological:     General: No focal deficit present.     Mental Status: She is alert and oriented to person, place, and time.   Psychiatric:        Mood and Affect: Mood normal.        Behavior: Behavior normal.    LAB RESULTS: Lab Results  Component Value Date   NA 133 (L) 01/09/2021   K 3.4 (L) 01/09/2021   CL 95 (L) 01/09/2021   CO2 26 01/09/2021   GLUCOSE 108 (H) 01/09/2021   BUN 10 01/09/2021   CREATININE 0.95 01/09/2021   CALCIUM 9.5 01/09/2021   PROT 7.9 01/09/2021   ALBUMIN 4.3 01/09/2021   AST 26 01/09/2021   ALT 22 01/09/2021   ALKPHOS 79 01/09/2021   BILITOT 0.6 01/09/2021   GFRNONAA >60 01/09/2021   GFRAA >60 06/05/2019    Lab Results  Component Value Date   WBC 14.3 (H) 01/23/2021   NEUTROABS 10.4 (H) 01/23/2021   HGB 19.2 (H) 01/23/2021   HCT 54.6 (H) 01/23/2021   MCV  106.8 (H) 01/23/2021   PLT 208 01/23/2021     STUDIES: No results found.  ASSESSMENT & PLAN: Mrs. Sol is a 45 year old female who returns to clinic for discussion of results and treatment planning.   Erythrocytosis- hemoglobin ~ 20 since January 2022. At previous visit, hmg 19.5, hct 55.7, rbc 5.25. EPO normal which makes hypoxia from smoking less likely. JAK2 test failed and will have to be repeated. UA did not show evidence of volume depletion making hemoconcentration less likely. Liver and kdiney function tests were normal. Pulse ox was normal. Symptomatic. Today, hemoglobin stable at 19.2. CO, CRP, Sed rate, JAK2 pending. Management based on results. If JAK2 mutation detected, likely PV or other MPN. If JAK2 negative evaluate for other mutations of MAK2 and possibly a bone marrow.  Leukocytosis- flow cytometry, CRP, Sed rate pending. Today, ANC 10.4. Smear was negative for leukocytosis. Continue to evaluate for PV.  Smoker- encouraged smoking cessation Hx of superficial venous thrombosis- segmental occlusive thrombus in basilic vein of forearm in 2020.  Hx of GI Bleed/Ulcer- hospitalized in September 2020 with hemoglobin of 4. Colonoscopy and EGD were negative for bleeding source. NSAID use. Received  transfusions. No recurrence of bleed.   Disposition: Labs today RTC in 1 week to see MD to establish care with labs (cbc)  Patient expressed understanding and was in agreement with this plan. She also understands that She can call clinic at any time with any questions, concerns, or complaints.   Cancer Staging No matching staging information was found for the patient.  Verlon Au, NP   01/23/2021 1:52 PM

## 2021-01-27 LAB — COMP PANEL: LEUKEMIA/LYMPHOMA

## 2021-01-27 LAB — CARBON MONOXIDE, BLOOD (PERFORMED AT REF LAB): Carbon Monoxide, Blood: 10.1 % — ABNORMAL HIGH (ref 0.0–3.6)

## 2021-01-28 ENCOUNTER — Inpatient Hospital Stay: Payer: Self-pay

## 2021-01-28 ENCOUNTER — Inpatient Hospital Stay (HOSPITAL_BASED_OUTPATIENT_CLINIC_OR_DEPARTMENT_OTHER): Payer: Self-pay | Admitting: Oncology

## 2021-01-28 ENCOUNTER — Other Ambulatory Visit: Payer: Self-pay

## 2021-01-28 ENCOUNTER — Encounter: Payer: Self-pay | Admitting: Oncology

## 2021-01-28 DIAGNOSIS — D72829 Elevated white blood cell count, unspecified: Secondary | ICD-10-CM

## 2021-01-28 DIAGNOSIS — D751 Secondary polycythemia: Secondary | ICD-10-CM | POA: Insufficient documentation

## 2021-01-28 DIAGNOSIS — D582 Other hemoglobinopathies: Secondary | ICD-10-CM

## 2021-01-28 LAB — CBC WITH DIFFERENTIAL/PLATELET
Abs Immature Granulocytes: 0.06 10*3/uL (ref 0.00–0.07)
Basophils Absolute: 0.1 10*3/uL (ref 0.0–0.1)
Basophils Relative: 1 %
Eosinophils Absolute: 0.2 10*3/uL (ref 0.0–0.5)
Eosinophils Relative: 2 %
HCT: 56.2 % — ABNORMAL HIGH (ref 36.0–46.0)
Hemoglobin: 19.4 g/dL — ABNORMAL HIGH (ref 12.0–15.0)
Immature Granulocytes: 1 %
Lymphocytes Relative: 24 %
Lymphs Abs: 3.2 10*3/uL (ref 0.7–4.0)
MCH: 37.5 pg — ABNORMAL HIGH (ref 26.0–34.0)
MCHC: 34.5 g/dL (ref 30.0–36.0)
MCV: 108.5 fL — ABNORMAL HIGH (ref 80.0–100.0)
Monocytes Absolute: 0.9 10*3/uL (ref 0.1–1.0)
Monocytes Relative: 7 %
Neutro Abs: 8.8 10*3/uL — ABNORMAL HIGH (ref 1.7–7.7)
Neutrophils Relative %: 65 %
Platelets: 205 10*3/uL (ref 150–400)
RBC: 5.18 MIL/uL — ABNORMAL HIGH (ref 3.87–5.11)
RDW: 15.3 % (ref 11.5–15.5)
WBC: 13.3 10*3/uL — ABNORMAL HIGH (ref 4.0–10.5)
nRBC: 0 % (ref 0.0–0.2)

## 2021-01-28 NOTE — Progress Notes (Signed)
Patient here for 1st time phlebotomy. MD ordered a 500cc phlebotomy, but only able to get 300cc due to the viscosity of the blood. Patient will hydrate and return to clinic on Friday for additional phlebotomy per Dr. Elroy Channel instruction.Patient in agreement t-with this plan.

## 2021-01-28 NOTE — Progress Notes (Signed)
Pt states her rt eye experiences "color scope" however only sees white or black. Happening more frequently now, more than once a day. Lasts about 20-30 minutes. As well as, BP has been "all over the place" even after beginning BP meds has not spoke to PCP about issue.

## 2021-01-29 ENCOUNTER — Inpatient Hospital Stay: Payer: Self-pay

## 2021-01-29 LAB — CARBON MONOXIDE, BLOOD (PERFORMED AT REF LAB): Carbon Monoxide, Blood: 8.8 % — ABNORMAL HIGH (ref 0.0–3.6)

## 2021-01-30 ENCOUNTER — Other Ambulatory Visit: Payer: Self-pay

## 2021-01-30 ENCOUNTER — Inpatient Hospital Stay: Payer: Self-pay

## 2021-01-30 NOTE — Progress Notes (Signed)
Patient here for phlebotomy. Due to blood viscosity, only able to draw off 300 cc instead of 500 cc. Patient will continue to hydrate and return next Wednesday for additional phlebotomy.

## 2021-01-31 ENCOUNTER — Encounter: Payer: Self-pay | Admitting: Oncology

## 2021-01-31 NOTE — Progress Notes (Signed)
Hematology/Oncology Consult note [facility]  Telephone:([phone] [phone]  Patient Care Team: Letta Median, MD as PCP - General (Family Medicine)   Name of the patient: Joanna Garcia  [id number]  11-22-1975   Date of visit: 01/31/21  Diagnosis-polycythemia of unclear etiology   Chief complaint/ Reason for visit-routine follow-up of polycythemia  Heme/Onc history: patient is a 45 year old female who was evaluated by NP Rulon Abide for polycythemia.Patient has had a macrocytic anemia upon 09/07/2020 and her MCV has been between 10 2-1 27.  Most recently on 01/09/2021 she was noted to have a white count of 16.2, H&H of 19.5/55.7 with an MCV of 106 and a platelet count of 217.  JAK2 mutation testing x1 was inconclusive and the second JAK2 mutation testing has been ordered which is not back presently.  Patient has had some history of folate deficiency back in 2020 along with severe B12 deficiency.Her hemoglobin improved significantly since then but macrocytosis has persisted.  She is an everyday smoker but does not report any trouble sleeping.  She has some ongoing fatigue.  No history of chronic lung disease.Urinalysis did not show any evidence of hematuria.  T4 level was low normal at 4.9  Interval history-patient currently reports some ongoing fatigue and exertional shortness of breath but denies other complaints  ECOG PS- 0 Pain scale- 0   Review of systems- Review of Systems  Constitutional:  Positive for malaise/fatigue. Negative for chills, fever and weight loss.  HENT:  Negative for congestion, ear discharge and nosebleeds.   Eyes:  Negative for blurred vision.  Respiratory:  Negative for cough, hemoptysis, sputum production, shortness of breath and wheezing.   Cardiovascular:  Negative for chest pain, palpitations, orthopnea and claudication.  Gastrointestinal:  Negative for abdominal pain, blood in stool, constipation, diarrhea,  heartburn, melena, nausea and vomiting.  Genitourinary:  Negative for dysuria, flank pain, frequency, hematuria and urgency.  Musculoskeletal:  Negative for back pain, joint pain and myalgias.  Skin:  Negative for rash.  Neurological:  Negative for dizziness, tingling, focal weakness, seizures, weakness and headaches.  Endo/Heme/Allergies:  Does not bruise/bleed easily.  Psychiatric/Behavioral:  Negative for depression and suicidal ideas. The patient does not have insomnia.    No Known Allergies   Past Medical History:  Diagnosis Date   DVT (deep venous thrombosis) (HCC)    GERD (gastroesophageal reflux disease)    Hypertension    Polyneuropathy      Past Surgical History:  Procedure Laterality Date   COLONOSCOPY N/A 03/16/2019   Procedure: COLONOSCOPY;  Surgeon: Toledo, Benay Pike, MD;  Location: [facility] ENDOSCOPY;  Service: Gastroenterology;  Laterality: N/A;   ESOPHAGOGASTRODUODENOSCOPY N/A 03/16/2019   Procedure: ESOPHAGOGASTRODUODENOSCOPY (EGD);  Surgeon: Toledo, Benay Pike, MD;  Location: [facility] ENDOSCOPY;  Service: Gastroenterology;  Laterality: N/A;   NO PAST SURGERIES      Social History   Socioeconomic History   Marital status: Divorced    Spouse name: Not on file   Number of children: Not on file   Years of education: Not on file   Highest education level: Not on file  Occupational History   Not on file  Tobacco Use   Smoking status: Every Day    Packs/day: 1.00    Types: Cigarettes   Smokeless tobacco: Never  Vaping Use   Vaping Use: Never used  Substance and Sexual Activity   Alcohol use: Yes    Comment: 21 Oz daily   Drug use: Not Currently  Types: Marijuana   Sexual activity: Not on file  Other Topics Concern   Not on file  Social History Narrative   Lives at home with mother.   Social Determinants of Health   Financial Resource Strain: Not on file  Food Insecurity: Not on file  Transportation Needs: Not on file  Physical Activity: Not on file   Stress: Not on file  Social Connections: Not on file  Intimate Partner Violence: Not on file    Family History  Problem Relation Age of Onset   Hypertension Mother    Hyperlipidemia Mother    Heart disease Mother    COPD Mother    Hypertension Father    Hyperlipidemia Father    Cancer Father        prostate   Leukemia Paternal Uncle    Cancer Paternal Grandmother    Breast cancer Paternal Grandmother      Current Outpatient Medications:    gabapentin (NEURONTIN) 300 MG capsule, Take 300 mg by mouth 5 (five) times daily., Disp: , Rfl:    lisinopril-hydrochlorothiazide (ZESTORETIC) 10-12.5 MG tablet, Take 1 tablet by mouth daily. Pt taking 1/2 tablet daily, Disp: , Rfl:    omeprazole (PRILOSEC) 40 MG capsule, Take 1 capsule (40 mg total) by mouth daily. (Patient taking differently: Take 40 mg by mouth 2 (two) times daily.), Disp: 90 capsule, Rfl: 0  Physical exam:  Vitals:   01/28/21 1418  BP: 117/83  Pulse: 92  Resp: 18  Temp: 98.6 F (37 C)  SpO2: 94%  Weight: 149 lb 9.3 oz (67.8 kg)   Physical Exam Constitutional:      General: She is not in acute distress.    Comments: Face appears plethoric  Cardiovascular:     Rate and Rhythm: Normal rate and regular rhythm.     Heart sounds: Normal heart sounds.  Pulmonary:     Effort: Pulmonary effort is normal.     Breath sounds: Normal breath sounds.  Abdominal:     General: Bowel sounds are normal.     Palpations: Abdomen is soft.     Comments: No palpable splenomegaly  Skin:    General: Skin is warm and dry.  Neurological:     Mental Status: She is alert and oriented to person, place, and time.     CMP Latest Ref Rng & Units 01/09/2021  Glucose 70 - 99 mg/dL 108(H)  BUN 6 - 20 mg/dL 10  Creatinine 0.44 - 1.00 mg/dL 0.95  Sodium 135 - 145 mmol/L 133(L)  Potassium 3.5 - 5.1 mmol/L 3.4(L)  Chloride 98 - 111 mmol/L 95(L)  CO2 22 - 32 mmol/L 26  Calcium 8.9 - 10.3 mg/dL 9.5  Total Protein 6.5 - 8.1 g/dL 7.9   Total Bilirubin 0.3 - 1.2 mg/dL 0.6  Alkaline Phos 38 - 126 U/L 79  AST 15 - 41 U/L 26  ALT 0 - 44 U/L 22   CBC Latest Ref Rng & Units 01/28/2021  WBC 4.0 - 10.5 K/uL 13.3(H)  Hemoglobin 12.0 - 15.0 g/dL 19.4(H)  Hematocrit 36.0 - 46.0 % 56.2(H)  Platelets 150 - 400 K/uL 205    Assessment and plan- Patient is a 45 y.o. female referred for polycythemia  On 07/20/2018 patient had evidence of macrocytic anemia likely secondary to B12 and folate deficiency.  Presently we are dealing with polycythemia of unclear etiology.  JAK2 mutation testing x1 was inconclusive and the second 1 was ordered and is currently in process.  Jak 2 exon  12 has been ordered today and is in process.  She does have elevated carbon monoxide levels in the blood which can be seen in smokers and certainly her polycythemia could be secondary.  However given that her EPO levels are at the lower end of normal, along with concomitant leukocytosis I would like to proceed with a bone marrow biopsy at this time.  We will attempt phlebotomy for 2-3 sessions in the meanwhile while awaiting bone marrow biopsy results.  Macrocytosis: We will repeat B12 and folate levels along with TSH  I will see her after bone marrow biopsy results are back.  Discussed differences between primary polycythemia vera which is associated with increased risk of thromboembolic events versus secondary polycythemia which can be seen in the setting of COPD/smoking.  Phlebotomy is indicated in polycythemia vera to bring down hematocrit less than 42 but not typically indicated and secondary polycythemia when we can have a higher target of 50-55.  Patient verbalized understanding   Visit Diagnosis 1. Polycythemia      Dr. Randa Evens, MD, MPH [facility] [phone] 01/31/2021 10:38 AM

## 2021-02-03 LAB — JAK2 EXONS 12-15

## 2021-02-03 LAB — JAK2  V617F QUAL. WITH REFLEX TO EXON 12: Reflex:: 15

## 2021-02-04 ENCOUNTER — Other Ambulatory Visit: Payer: Self-pay

## 2021-02-04 ENCOUNTER — Inpatient Hospital Stay: Payer: Self-pay

## 2021-02-04 LAB — JAK2 EXONS 12-15

## 2021-02-04 NOTE — Progress Notes (Signed)
Unable to obtain an IV, no phlebotomy performed today. Dr. Janese Banks notified.

## 2021-02-05 ENCOUNTER — Other Ambulatory Visit: Payer: Self-pay | Admitting: *Deleted

## 2021-02-05 ENCOUNTER — Encounter: Payer: Self-pay | Admitting: Oncology

## 2021-02-05 DIAGNOSIS — D751 Secondary polycythemia: Secondary | ICD-10-CM

## 2021-02-05 DIAGNOSIS — D72829 Elevated white blood cell count, unspecified: Secondary | ICD-10-CM

## 2021-02-05 LAB — MISC LABCORP TEST (SEND OUT): LabCorp test name: 451953

## 2021-02-05 LAB — INTELLIGEN MYELOID

## 2021-02-09 ENCOUNTER — Emergency Department (HOSPITAL_COMMUNITY): Admission: EM | Admit: 2021-02-09 | Payer: Self-pay

## 2021-02-09 ENCOUNTER — Emergency Department: Payer: Self-pay

## 2021-02-09 ENCOUNTER — Emergency Department (HOSPITAL_COMMUNITY): Payer: Self-pay | Admitting: Certified Registered Nurse Anesthetist

## 2021-02-09 ENCOUNTER — Encounter: Payer: Self-pay | Admitting: Radiology

## 2021-02-09 ENCOUNTER — Emergency Department
Admission: EM | Admit: 2021-02-09 | Discharge: 2021-02-09 | Disposition: A | Discharge status: Other Acute Inpatient Hospital | Payer: Self-pay | Attending: Emergency Medicine | Admitting: Emergency Medicine

## 2021-02-09 ENCOUNTER — Inpatient Hospital Stay (HOSPITAL_COMMUNITY): Payer: Self-pay

## 2021-02-09 ENCOUNTER — Encounter (HOSPITAL_COMMUNITY)
Admission: EM | Disposition: A | Discharge status: Home / Self Care | Payer: Self-pay | Source: Home / Self Care | Attending: Neurology

## 2021-02-09 ENCOUNTER — Inpatient Hospital Stay (HOSPITAL_COMMUNITY)
Admission: EM | Admit: 2021-02-09 | Discharge: 2021-02-11 | DRG: 023 | Disposition: A | Discharge status: Home / Self Care | Payer: Self-pay | Attending: Neurology | Admitting: Neurology

## 2021-02-09 ENCOUNTER — Emergency Department (HOSPITAL_COMMUNITY): Payer: Self-pay

## 2021-02-09 DIAGNOSIS — Z20822 Contact with and (suspected) exposure to covid-19: Secondary | ICD-10-CM | POA: Insufficient documentation

## 2021-02-09 DIAGNOSIS — J439 Emphysema, unspecified: Secondary | ICD-10-CM | POA: Diagnosis present

## 2021-02-09 DIAGNOSIS — Z4659 Encounter for fitting and adjustment of other gastrointestinal appliance and device: Secondary | ICD-10-CM

## 2021-02-09 DIAGNOSIS — Z978 Presence of other specified devices: Secondary | ICD-10-CM | POA: Diagnosis present

## 2021-02-09 DIAGNOSIS — Z79899 Other long term (current) drug therapy: Secondary | ICD-10-CM

## 2021-02-09 DIAGNOSIS — Z86718 Personal history of other venous thrombosis and embolism: Secondary | ICD-10-CM

## 2021-02-09 DIAGNOSIS — I63231 Cerebral infarction due to unspecified occlusion or stenosis of right carotid arteries: Secondary | ICD-10-CM

## 2021-02-09 DIAGNOSIS — E538 Deficiency of other specified B group vitamins: Secondary | ICD-10-CM | POA: Diagnosis present

## 2021-02-09 DIAGNOSIS — R531 Weakness: Secondary | ICD-10-CM

## 2021-02-09 DIAGNOSIS — F17210 Nicotine dependence, cigarettes, uncomplicated: Secondary | ICD-10-CM | POA: Insufficient documentation

## 2021-02-09 DIAGNOSIS — D751 Secondary polycythemia: Secondary | ICD-10-CM

## 2021-02-09 DIAGNOSIS — R29712 NIHSS score 12: Secondary | ICD-10-CM | POA: Diagnosis present

## 2021-02-09 DIAGNOSIS — Z825 Family history of asthma and other chronic lower respiratory diseases: Secondary | ICD-10-CM

## 2021-02-09 DIAGNOSIS — Z7982 Long term (current) use of aspirin: Secondary | ICD-10-CM

## 2021-02-09 DIAGNOSIS — R270 Ataxia, unspecified: Secondary | ICD-10-CM | POA: Diagnosis present

## 2021-02-09 DIAGNOSIS — F1010 Alcohol abuse, uncomplicated: Secondary | ICD-10-CM | POA: Diagnosis present

## 2021-02-09 DIAGNOSIS — Z8249 Family history of ischemic heart disease and other diseases of the circulatory system: Secondary | ICD-10-CM

## 2021-02-09 DIAGNOSIS — G8194 Hemiplegia, unspecified affecting left nondominant side: Secondary | ICD-10-CM | POA: Diagnosis present

## 2021-02-09 DIAGNOSIS — I10 Essential (primary) hypertension: Secondary | ICD-10-CM | POA: Insufficient documentation

## 2021-02-09 DIAGNOSIS — E785 Hyperlipidemia, unspecified: Secondary | ICD-10-CM | POA: Diagnosis present

## 2021-02-09 DIAGNOSIS — H5340 Unspecified visual field defects: Secondary | ICD-10-CM | POA: Diagnosis present

## 2021-02-09 DIAGNOSIS — I639 Cerebral infarction, unspecified: Secondary | ICD-10-CM

## 2021-02-09 DIAGNOSIS — U071 COVID-19: Secondary | ICD-10-CM | POA: Insufficient documentation

## 2021-02-09 DIAGNOSIS — Z8672 Personal history of thrombophlebitis: Secondary | ICD-10-CM

## 2021-02-09 DIAGNOSIS — Z9911 Dependence on respirator [ventilator] status: Secondary | ICD-10-CM

## 2021-02-09 DIAGNOSIS — I63511 Cerebral infarction due to unspecified occlusion or stenosis of right middle cerebral artery: Principal | ICD-10-CM

## 2021-02-09 DIAGNOSIS — G629 Polyneuropathy, unspecified: Secondary | ICD-10-CM | POA: Diagnosis present

## 2021-02-09 DIAGNOSIS — R4781 Slurred speech: Secondary | ICD-10-CM | POA: Diagnosis present

## 2021-02-09 DIAGNOSIS — Y900 Blood alcohol level of less than 20 mg/100 ml: Secondary | ICD-10-CM | POA: Insufficient documentation

## 2021-02-09 DIAGNOSIS — Z83438 Family history of other disorder of lipoprotein metabolism and other lipidemia: Secondary | ICD-10-CM

## 2021-02-09 DIAGNOSIS — Z9282 Status post administration of tPA (rtPA) in a different facility within the last 24 hours prior to admission to current facility: Secondary | ICD-10-CM

## 2021-02-09 DIAGNOSIS — D6859 Other primary thrombophilia: Secondary | ICD-10-CM | POA: Diagnosis present

## 2021-02-09 DIAGNOSIS — D7589 Other specified diseases of blood and blood-forming organs: Secondary | ICD-10-CM | POA: Diagnosis present

## 2021-02-09 DIAGNOSIS — K219 Gastro-esophageal reflux disease without esophagitis: Secondary | ICD-10-CM | POA: Diagnosis present

## 2021-02-09 DIAGNOSIS — I63521 Cerebral infarction due to unspecified occlusion or stenosis of right anterior cerebral artery: Secondary | ICD-10-CM | POA: Diagnosis present

## 2021-02-09 DIAGNOSIS — R29810 Facial weakness: Secondary | ICD-10-CM | POA: Diagnosis present

## 2021-02-09 DIAGNOSIS — I6523 Occlusion and stenosis of bilateral carotid arteries: Secondary | ICD-10-CM | POA: Diagnosis present

## 2021-02-09 DIAGNOSIS — I63239 Cerebral infarction due to unspecified occlusion or stenosis of unspecified carotid arteries: Secondary | ICD-10-CM | POA: Insufficient documentation

## 2021-02-09 HISTORY — PX: IR US GUIDE VASC ACCESS RIGHT: IMG2390

## 2021-02-09 HISTORY — PX: IR PERCUTANEOUS ART THROMBECTOMY/INFUSION INTRACRANIAL INC DIAG ANGIO: IMG6087

## 2021-02-09 HISTORY — PX: IR ANGIO INTRA EXTRACRAN SEL COM CAROTID INNOMINATE UNI L MOD SED: IMG5358

## 2021-02-09 HISTORY — PX: IR INTRAVSC STENT CERV CAROTID W/EMB-PROT MOD SED INCL ANGIO: IMG2303

## 2021-02-09 HISTORY — PX: RADIOLOGY WITH ANESTHESIA: SHX6223

## 2021-02-09 LAB — POCT I-STAT 7, (LYTES, BLD GAS, ICA,H+H)
Acid-Base Excess: 0 mmol/L (ref 0.0–2.0)
Bicarbonate: 25.2 mmol/L (ref 20.0–28.0)
Calcium, Ion: 1.09 mmol/L — ABNORMAL LOW (ref 1.15–1.40)
HCT: 44 % (ref 36.0–46.0)
Hemoglobin: 15 g/dL (ref 12.0–15.0)
O2 Saturation: 100 %
Patient temperature: 98.4
Potassium: 3.8 mmol/L (ref 3.5–5.1)
Sodium: 139 mmol/L (ref 135–145)
TCO2: 27 mmol/L (ref 22–32)
pCO2 arterial: 43.6 mmHg (ref 32.0–48.0)
pH, Arterial: 7.369 (ref 7.350–7.450)
pO2, Arterial: 305 mmHg — ABNORMAL HIGH (ref 83.0–108.0)

## 2021-02-09 LAB — ETHANOL
Alcohol, Ethyl (B): <10 mg/dL (ref <10)
Alcohol, Ethyl (B): <10 mg/dL (ref <10)

## 2021-02-09 LAB — COMPREHENSIVE METABOLIC PANEL
ALT: 31 U/L (ref 0–44)
AST: 23 U/L (ref 15–41)
Albumin: 3.5 g/dL (ref 3.5–5.0)
Alkaline Phosphatase: 70 U/L (ref 38–126)
Anion gap: 7 (ref 5–15)
BUN: 8 mg/dL (ref 6–20)
CO2: 27 mmol/L (ref 22–32)
Calcium: 8.3 mg/dL — ABNORMAL LOW (ref 8.9–10.3)
Chloride: 103 mmol/L (ref 98–111)
Creatinine, Ser: 0.77 mg/dL (ref 0.44–1.00)
GFR, Estimated: >60 mL/min (ref >60)
Glucose, Bld: 114 mg/dL — ABNORMAL HIGH (ref 70–99)
Potassium: 3.4 mmol/L — ABNORMAL LOW (ref 3.5–5.1)
Sodium: 137 mmol/L (ref 135–145)
Total Bilirubin: 0.6 mg/dL (ref 0.3–1.2)
Total Protein: 6.2 g/dL — ABNORMAL LOW (ref 6.5–8.1)

## 2021-02-09 LAB — DIFFERENTIAL
Abs Immature Granulocytes: 0.02 10*3/uL (ref 0.00–0.07)
Basophils Absolute: 0 10*3/uL (ref 0.0–0.1)
Basophils Relative: 0 %
Eosinophils Absolute: 0.1 10*3/uL (ref 0.0–0.5)
Eosinophils Relative: 1 %
Immature Granulocytes: 0 %
Lymphocytes Relative: 33 %
Lymphs Abs: 1.5 10*3/uL (ref 0.7–4.0)
Monocytes Absolute: 0.5 10*3/uL (ref 0.1–1.0)
Monocytes Relative: 10 %
Neutro Abs: 2.5 10*3/uL (ref 1.7–7.7)
Neutrophils Relative %: 56 %

## 2021-02-09 LAB — CBC
HCT: 46.2 % — ABNORMAL HIGH (ref 36.0–46.0)
Hemoglobin: 15.9 g/dL — ABNORMAL HIGH (ref 12.0–15.0)
MCH: 37.5 pg — ABNORMAL HIGH (ref 26.0–34.0)
MCHC: 34.4 g/dL (ref 30.0–36.0)
MCV: 109 fL — ABNORMAL HIGH (ref 80.0–100.0)
Platelets: 161 10*3/uL (ref 150–400)
RBC: 4.24 MIL/uL (ref 3.87–5.11)
RDW: 14.2 % (ref 11.5–15.5)
WBC: 4.5 10*3/uL (ref 4.0–10.5)
nRBC: 0 % (ref 0.0–0.2)

## 2021-02-09 LAB — RESP PANEL BY RT-PCR (FLU A&B, COVID) ARPGX2
Influenza A by PCR: NEGATIVE
Influenza B by PCR: NEGATIVE
SARS Coronavirus 2 by RT PCR: POSITIVE — AB

## 2021-02-09 LAB — MRSA NEXT GEN BY PCR, NASAL: MRSA by PCR Next Gen: NOT DETECTED

## 2021-02-09 LAB — PROTIME-INR
INR: 1 (ref 0.8–1.2)
Prothrombin Time: 13 seconds (ref 11.4–15.2)

## 2021-02-09 LAB — APTT: aPTT: 26 seconds (ref 24–36)

## 2021-02-09 LAB — HIV ANTIBODY (ROUTINE TESTING W REFLEX): HIV Screen 4th Generation wRfx: NONREACTIVE

## 2021-02-09 SURGERY — IR WITH ANESTHESIA
Anesthesia: General

## 2021-02-09 MED ORDER — SENNOSIDES-DOCUSATE SODIUM 8.6-50 MG PO TABS: 1.0000 | ORAL_TABLET | Freq: Every evening | ORAL | Status: DC | PRN

## 2021-02-09 MED ORDER — VERAPAMIL HCL 2.5 MG/ML IV SOLN
INTRAVENOUS | Status: AC
  Filled 2021-02-09: qty 6

## 2021-02-09 MED ORDER — LORAZEPAM 2 MG PO TABS: 0.0000 mg | ORAL_TABLET | Freq: Four times a day (QID) | ORAL | Status: DC

## 2021-02-09 MED ORDER — LORAZEPAM 2 MG PO TABS: 0.0000 mg | ORAL_TABLET | Freq: Two times a day (BID) | ORAL | Status: DC

## 2021-02-09 MED ORDER — SODIUM CHLORIDE 0.9 % IV SOLN
INTRAVENOUS | Status: AC | PRN
  Administered 2021-02-09: 2 ug/kg/min via INTRAVENOUS

## 2021-02-09 MED ORDER — CANGRELOR BOLUS VIA INFUSION
INTRAVENOUS | Status: AC | PRN
  Administered 2021-02-09: 993 ug via INTRAVENOUS

## 2021-02-09 MED ORDER — LIDOCAINE 2% (20 MG/ML) 5 ML SYRINGE
INTRAMUSCULAR | Status: DC | PRN
Start: 2021-02-09 — End: 2021-02-09
  Administered 2021-02-09: 60 mg via INTRAVENOUS

## 2021-02-09 MED ORDER — PHENYLEPHRINE 40 MCG/ML (10ML) SYRINGE FOR IV PUSH (FOR BLOOD PRESSURE SUPPORT)
PREFILLED_SYRINGE | INTRAVENOUS | Status: DC | PRN
  Administered 2021-02-09 (×3): 80 ug via INTRAVENOUS

## 2021-02-09 MED ORDER — SODIUM CHLORIDE 0.9 % IV SOLN
50.0000 mL | Freq: Once | INTRAVENOUS | Status: AC
  Administered 2021-02-09: 50 mL via INTRAVENOUS

## 2021-02-09 MED ORDER — ASPIRIN 81 MG PO CHEW
81.0000 mg | CHEWABLE_TABLET | Freq: Every day | ORAL | Status: DC
  Administered 2021-02-09: 81 mg
  Filled 2021-02-09: qty 1

## 2021-02-09 MED ORDER — PROPOFOL 10 MG/ML IV BOLUS
INTRAVENOUS | Status: DC | PRN
  Administered 2021-02-09: 150 mg via INTRAVENOUS

## 2021-02-09 MED ORDER — CLEVIDIPINE BUTYRATE 0.5 MG/ML IV EMUL: 0.0000 mg/h | INTRAVENOUS | Status: DC

## 2021-02-09 MED ORDER — ADULT MULTIVITAMIN W/MINERALS CH
1.0000 | ORAL_TABLET | Freq: Every day | ORAL | Status: DC
  Filled 2021-02-09: qty 1

## 2021-02-09 MED ORDER — FOLIC ACID 1 MG PO TABS
1.0000 mg | ORAL_TABLET | Freq: Every day | ORAL | Status: DC
  Filled 2021-02-09: qty 1

## 2021-02-09 MED ORDER — STROKE: EARLY STAGES OF RECOVERY BOOK
Freq: Once | Status: AC
  Administered 2021-02-10: 1
  Filled 2021-02-09: qty 1

## 2021-02-09 MED ORDER — EPHEDRINE SULFATE-NACL 50-0.9 MG/10ML-% IV SOSY
PREFILLED_SYRINGE | INTRAVENOUS | Status: DC | PRN
  Administered 2021-02-09: 5 mg via INTRAVENOUS

## 2021-02-09 MED ORDER — DEXAMETHASONE SODIUM PHOSPHATE 10 MG/ML IJ SOLN
INTRAMUSCULAR | Status: DC | PRN
  Administered 2021-02-09: 5 mg via INTRAVENOUS

## 2021-02-09 MED ORDER — CHLORHEXIDINE GLUCONATE 0.12% ORAL RINSE (MEDLINE KIT)
15.0000 mL | Freq: Two times a day (BID) | OROMUCOSAL | Status: DC
  Administered 2021-02-09: 15 mL via OROMUCOSAL

## 2021-02-09 MED ORDER — LACTATED RINGERS IV SOLN: INTRAVENOUS | Status: DC | PRN

## 2021-02-09 MED ORDER — CANGRELOR TETRASODIUM 50 MG IV SOLR
INTRAVENOUS | Status: AC
  Filled 2021-02-09: qty 50

## 2021-02-09 MED ORDER — TICAGRELOR 90 MG PO TABS
180.0000 mg | ORAL_TABLET | Freq: Once | ORAL | Status: DC
  Filled 2021-02-09: qty 2

## 2021-02-09 MED ORDER — FENTANYL CITRATE (PF) 100 MCG/2ML IJ SOLN
INTRAMUSCULAR | Status: DC | PRN
  Administered 2021-02-09: 100 ug via INTRAVENOUS

## 2021-02-09 MED ORDER — LABETALOL HCL 5 MG/ML IV SOLN
10.0000 mg | INTRAVENOUS | Status: DC
  Administered 2021-02-09: 10 mg via INTRAVENOUS
  Filled 2021-02-09: qty 4

## 2021-02-09 MED ORDER — LORAZEPAM 2 MG/ML IJ SOLN
0.0000 mg | Freq: Two times a day (BID) | INTRAMUSCULAR | Status: DC
Start: 2021-02-12 — End: 2021-02-09

## 2021-02-09 MED ORDER — IOHEXOL 240 MG/ML SOLN
50.0000 mL | Freq: Once | INTRAMUSCULAR | Status: AC | PRN
  Administered 2021-02-09: 40 mL via INTRA_ARTERIAL

## 2021-02-09 MED ORDER — ASPIRIN 81 MG PO CHEW
CHEWABLE_TABLET | ORAL | Status: AC | PRN
  Administered 2021-02-09: 81 mg

## 2021-02-09 MED ORDER — ACETAMINOPHEN 650 MG RE SUPP: 650.0000 mg | RECTAL | Status: DC | PRN

## 2021-02-09 MED ORDER — LORAZEPAM 2 MG/ML IJ SOLN
0.0000 mg | Freq: Four times a day (QID) | INTRAMUSCULAR | Status: DC
  Administered 2021-02-09: 1 mg via INTRAVENOUS
  Filled 2021-02-09: qty 1

## 2021-02-09 MED ORDER — ASPIRIN 81 MG PO CHEW
81.0000 mg | CHEWABLE_TABLET | Freq: Every day | ORAL | Status: DC
  Administered 2021-02-10 – 2021-02-11 (×2): 81 mg via ORAL
  Filled 2021-02-09 (×2): qty 1

## 2021-02-09 MED ORDER — ONDANSETRON HCL 4 MG/2ML IJ SOLN: 4.0000 mg | Freq: Four times a day (QID) | INTRAMUSCULAR | Status: DC | PRN

## 2021-02-09 MED ORDER — THIAMINE HCL 100 MG/ML IJ SOLN: 100.0000 mg | Freq: Every day | INTRAMUSCULAR | Status: DC

## 2021-02-09 MED ORDER — IOHEXOL 240 MG/ML SOLN
50.0000 mL | Freq: Once | INTRAMUSCULAR | Status: AC | PRN
  Administered 2021-02-09: 20 mL via INTRA_ARTERIAL

## 2021-02-09 MED ORDER — TICAGRELOR 90 MG PO TABS
ORAL_TABLET | ORAL | Status: AC
  Filled 2021-02-09: qty 2

## 2021-02-09 MED ORDER — PHENYLEPHRINE HCL-NACL 20-0.9 MG/250ML-% IV SOLN
INTRAVENOUS | Status: DC | PRN
  Administered 2021-02-09: 40 ug/min via INTRAVENOUS

## 2021-02-09 MED ORDER — ALTEPLASE (STROKE) FULL DOSE INFUSION
0.9000 mg/kg | Freq: Once | INTRAVENOUS | Status: AC
  Administered 2021-02-09: 61.1 mg via INTRAVENOUS
  Filled 2021-02-09: qty 100

## 2021-02-09 MED ORDER — PROPOFOL 500 MG/50ML IV EMUL
INTRAVENOUS | Status: DC | PRN
  Administered 2021-02-09: 30 ug/kg/min via INTRAVENOUS

## 2021-02-09 MED ORDER — SUCCINYLCHOLINE CHLORIDE 200 MG/10ML IV SOSY
PREFILLED_SYRINGE | INTRAVENOUS | Status: DC | PRN
  Administered 2021-02-09: 120 mg via INTRAVENOUS

## 2021-02-09 MED ORDER — TICAGRELOR 60 MG PO TABS
ORAL_TABLET | ORAL | Status: AC | PRN
  Administered 2021-02-09: 180 mg

## 2021-02-09 MED ORDER — CHLORHEXIDINE GLUCONATE CLOTH 2 % EX PADS
6.0000 | MEDICATED_PAD | Freq: Every day | CUTANEOUS | Status: DC
  Administered 2021-02-09 – 2021-02-11 (×3): 6 via TOPICAL

## 2021-02-09 MED ORDER — ROCURONIUM BROMIDE 10 MG/ML (PF) SYRINGE
PREFILLED_SYRINGE | INTRAVENOUS | Status: DC | PRN
  Administered 2021-02-09 (×2): 50 mg via INTRAVENOUS

## 2021-02-09 MED ORDER — ONDANSETRON HCL 4 MG/2ML IJ SOLN
INTRAMUSCULAR | Status: DC | PRN
Start: 2021-02-09 — End: 2021-02-09
  Administered 2021-02-09: 4 mg via INTRAVENOUS

## 2021-02-09 MED ORDER — PANTOPRAZOLE SODIUM 40 MG IV SOLR
40.0000 mg | Freq: Every day | INTRAVENOUS | Status: DC
  Administered 2021-02-09 – 2021-02-10 (×2): 40 mg via INTRAVENOUS
  Filled 2021-02-09 (×2): qty 40

## 2021-02-09 MED ORDER — ASPIRIN 81 MG PO CHEW
CHEWABLE_TABLET | ORAL | Status: AC
  Filled 2021-02-09: qty 1

## 2021-02-09 MED ORDER — THIAMINE HCL 100 MG PO TABS
100.0000 mg | ORAL_TABLET | Freq: Every day | ORAL | Status: DC
  Filled 2021-02-09: qty 1

## 2021-02-09 MED ORDER — PHENYLEPHRINE HCL-NACL 20-0.9 MG/250ML-% IV SOLN
0.0000 ug/min | INTRAVENOUS | Status: DC
  Administered 2021-02-09: 25 ug/min via INTRAVENOUS
  Administered 2021-02-10: 20 ug/min via INTRAVENOUS
  Filled 2021-02-09: qty 250

## 2021-02-09 MED ORDER — ACETAMINOPHEN 325 MG PO TABS
650.0000 mg | ORAL_TABLET | ORAL | Status: DC | PRN
  Administered 2021-02-11: 650 mg via ORAL
  Filled 2021-02-09: qty 2

## 2021-02-09 MED ORDER — ACETAMINOPHEN 160 MG/5ML PO SOLN
650.0000 mg | ORAL | Status: DC | PRN
  Filled 2021-02-09: qty 20.3

## 2021-02-09 MED ORDER — SODIUM CHLORIDE 0.9 % IV SOLN: INTRAVENOUS | Status: DC

## 2021-02-09 MED ORDER — ORAL CARE MOUTH RINSE
15.0000 mL | OROMUCOSAL | Status: DC
  Administered 2021-02-09 – 2021-02-10 (×2): 15 mL via OROMUCOSAL

## 2021-02-09 MED ORDER — VERAPAMIL HCL 2.5 MG/ML IV SOLN
INTRAVENOUS | Status: AC | PRN
  Administered 2021-02-09 (×2): 5 mg via INTRA_ARTERIAL

## 2021-02-09 MED ORDER — NICARDIPINE HCL IN NACL 20-0.86 MG/200ML-% IV SOLN: 0.0000 mg/h | INTRAVENOUS | Status: DC

## 2021-02-09 MED ORDER — IOHEXOL 350 MG/ML SOLN
75.0000 mL | Freq: Once | INTRAVENOUS | Status: AC | PRN
  Administered 2021-02-09: 75 mL via INTRAVENOUS

## 2021-02-09 MED ORDER — CLEVIDIPINE BUTYRATE 0.5 MG/ML IV EMUL
INTRAVENOUS | Status: AC
  Filled 2021-02-09: qty 50

## 2021-02-09 MED ORDER — PROPOFOL 1000 MG/100ML IV EMUL
5.0000 ug/kg/min | INTRAVENOUS | Status: DC
  Administered 2021-02-09: 30 ug/kg/min via INTRAVENOUS
  Filled 2021-02-09: qty 100

## 2021-02-09 MED ORDER — THIAMINE HCL 100 MG PO TABS: 100.0000 mg | ORAL_TABLET | Freq: Every day | ORAL | Status: DC

## 2021-02-09 NOTE — Progress Notes (Signed)
Unable to assess NIH, pt under the care of anesthesia at this time. Intubated and sedated.

## 2021-02-09 NOTE — Anesthesia Procedure Notes (Signed)
Procedure Name: Intubation Date/Time: 02/09/2021 5:22 PM Performed by: Janene Harvey, CRNA Pre-anesthesia Checklist: Patient identified, Emergency Drugs available, Suction available and Patient being monitored Patient Re-evaluated:Patient Re-evaluated prior to induction Oxygen Delivery Method: Circle system utilized Preoxygenation: Pre-oxygenation with 100% oxygen Induction Type: IV induction, Rapid sequence and Cricoid Pressure applied Laryngoscope Size: Mac and 4 Grade View: Grade I Tube size: 7.5 mm Placement Confirmation: positive ETCO2, CO2 detector, breath sounds checked- equal and bilateral and ETT inserted through vocal cords under direct vision Secured at: 23 cm Dental Injury: Teeth and Oropharynx as per pre-operative assessment

## 2021-02-09 NOTE — Anesthesia Preprocedure Evaluation (Signed)
Anesthesia Evaluation  Patient identified by MRN, date of birth, ID band Patient awake    Reviewed: Allergy & Precautions, Patient's Chart, lab work & pertinent test results, Unable to perform ROS - Chart review onlyPreop documentation limited or incomplete due to emergent nature of procedure.  History of Anesthesia Complications Negative for: history of anesthetic complications  Airway Mallampati: II  TM Distance: >3 FB Neck ROM: Full    Dental  (+) Edentulous Upper, Dental Advisory Given   Pulmonary Current Smoker,    Pulmonary exam normal        Cardiovascular hypertension, Pt. on medications + DVT  Normal cardiovascular exam     Neuro/Psych  Neuromuscular disease CVA    GI/Hepatic GERD  Medicated,  Endo/Other    Renal/GU      Musculoskeletal   Abdominal   Peds  Hematology  Possible polycythemia hx, w/u unclear     Anesthesia Other Findings   Reproductive/Obstetrics                             Anesthesia Physical Anesthesia Plan  ASA: 4 and emergent  Anesthesia Plan: General   Post-op Pain Management:    Induction: Intravenous and Rapid sequence  PONV Risk Score and Plan: 2 and Treatment may vary due to age or medical condition, Ondansetron and Dexamethasone  Airway Management Planned: Oral ETT  Additional Equipment: None  Intra-op Plan:   Post-operative Plan: Possible Post-op intubation/ventilation  Informed Consent:     Only emergency history available and Dental advisory given  Plan Discussed with: CRNA, Anesthesiologist and Surgeon  Anesthesia Plan Comments:         Anesthesia Quick Evaluation

## 2021-02-09 NOTE — Progress Notes (Addendum)
S: Awake and following commands. Remained intubated after VIR due to Covid.   O: BP 101/65   Pulse 66   Temp 98.4 F (36.9 C) (Axillary)   Resp 17   Ht '5\' 5"'$  (1.651 m) Comment: measured by RT  LMP 08/28/2017   SpO2 93%   BMI 24.30 kg/m   Ment: Awake and alert. Follows all motor commands. Gesticulates that she wants to be extubated.  CN: Fixates and tracks normally. EOMI. Left face contracts less briskly than right when asked to smile.  Motor:  RUE and RLE. 5/5 LUE 4/5 LLE 4/5 Sensory: Intact to FT x 4.  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 Has polycythemia and history of DVT, but no prior history of stroke. Her polycythemia may be secondary given COPD and tobacco abuse.  - Now awake and alert.  - Mild left sided weakness on exam - Have contacted CCM for vent management versus extubation.  - Per VIR, post-procedure plan as follows: - SBP 120-140 mmHg - Bed rest x6 hours s/p femoral artery access - Patient was loaded on ticagrelor 180 mg + ASA 81 mg - Start ticagrelor 90 mg bid + ASA 81 mg tomorrow  - Other components of the diagnostic and treatment plan per Dr. Lyn Records H and P.   20 minutes of additional critical care time.   Electronically signed: Dr. Kerney Elbe

## 2021-02-09 NOTE — Progress Notes (Signed)
Pt mother, [name], called to provide her phone number  MotherJuliete Shawver =  [phone]  Pt goes by "Joanna Garcia"

## 2021-02-09 NOTE — Progress Notes (Signed)
Anesthesia present for case. Pt sedated and intubated at this time

## 2021-02-09 NOTE — Progress Notes (Signed)
CODE STROKE- PHARMACY COMMUNICATION   Time CODE STROKE called/page received: 1515  Time response to CODE STROKE was made (in person or via phone): 1515  Time Stroke Kit retrieved from [place] (only if needed): 1518  Name of Provider/Nurse contacted: Dr. Rory Percy, Neurology; tPA given [place], PharmD, BCPS Clinical Pharmacist 02/09/2021 3:51 PM

## 2021-02-09 NOTE — Sedation Documentation (Signed)
Report given to Randell Loop RN via phone and at bedside. Pt transported to 4N24 safely via pt bed with CRNA and RT. Pt remains intubated and sedated. Vitals stable upon handoff as well as right groin site-level 0.

## 2021-02-09 NOTE — H&P (Addendum)
Neurology H&P  CC:   History is obtained from: Patient, chart review and EMS  HPI: Joanna Garcia is a 45 y.o. right-handed woman   She was initially seen by Dr. Amie Portland at [facility] hospital, who noted the following:  "Joanna Garcia is a 45 y.o. female PMH of polycythemia, polyneuropathy, alcohol abuse, not on any anticoagulation, sudden onset of left-sided weakness with last known well 2:30 PM today. Patient reports that she was at home, unloading the dishwasher when she had sudden onset of weakness on the left side.  Her arm and leg gave out.  She also noticed that her left face was droopy.  Her arm and face also felt numb.  She also had slurred speech when she attempted to talk. EMS was contacted, they evaluated the patient and activated a code stroke and brought her for evaluation to [facility] where she was seen at the bridge and evaluated and taken for CT head. CT head unremarkable for acute process. Possible dense MCA sign Risks and benefits of IV tPA discussed with the patient and IV tPA was given after CT did not show any evidence of bleed-reviewed by me. Following tPA administration, CT angiography of the head and neck was done which showed proximal right ICA occlusion with distal reconstitution, 75% stenosis of the distal right common carotid with plaque versus thrombus, proximal right M2 severe stenosis or subocclusive embolus and multiple missing right MCA branch vessels, occluded left common carotid with ICA and ECA reconstitution. Case discussed with neuro interventionalist Dr. Ladean Raya and EVT offered to the patient who agreed. Prolonged delay in transfer-due to in availability of critical transport as well as inability of EMS to transport without an Therapist, sports and no RN being available."  Per EMS [facility] team, her NIH had improved in transport to a modified NIH of 1.  However by the time of my evaluation it was worsened again as documented below.  She was  taken emergently to the IR suite for intervention  LKW: 2:30 PM tPA given?:  Yes, at [facility] at 1544 IA performed?:  Yes Premorbid modified rankin scale:      0 - No symptoms.  Regarding her polycythemia, she is followed by oncology, last seen on 01/28/2021 at which time she was planned for a bone marrow biopsy for further evaluation.  JAK2 mutation testing was inconclusive and the second test is still pending.  She is also noted to have a history of folate deficiency in 2020 along with a severe B12 deficiency.  Some concern for secondary polycythemia in the setting of smoking; she was also being treated with phlebotomy to reduce her hematocrit   ROS: Limited review of systems obtained due to emergent thrombectomy needed.  She was complaining of a headache at the time of her arrival but had no other complaints of pain.  Past Medical History:  Diagnosis Date   DVT (deep venous thrombosis) (HCC)    GERD (gastroesophageal reflux disease)    Hypertension    Polyneuropathy    Past Surgical History:  Procedure Laterality Date   COLONOSCOPY N/A 03/16/2019   Procedure: COLONOSCOPY;  Surgeon: Toledo, Benay Pike, MD;  Location: [facility] ENDOSCOPY;  Service: Gastroenterology;  Laterality: N/A;   ESOPHAGOGASTRODUODENOSCOPY N/A 03/16/2019   Procedure: ESOPHAGOGASTRODUODENOSCOPY (EGD);  Surgeon: Toledo, Benay Pike, MD;  Location: [facility] ENDOSCOPY;  Service: Gastroenterology;  Laterality: N/A;   NO PAST SURGERIES       Family History  Problem Relation Age of Onset  Hypertension Mother    Hyperlipidemia Mother    Heart disease Mother    COPD Mother    Hypertension Father    Hyperlipidemia Father    Cancer Father        prostate   Leukemia Paternal Uncle    Cancer Paternal Grandmother    Breast cancer Paternal Grandmother    Social History:  reports that she has been smoking cigarettes. She has been smoking an average of 1 pack per day. She has never used smokeless tobacco. She reports current alcohol  use. She reports that she does not currently use drugs after having used the following drugs: Marijuana.   Exam: Current vital signs: LMP 08/28/2017  Vital signs in last 24 hours: Temp:  [98.1 F (36.7 C)-99.1 F (37.3 C)] 98.1 F (36.7 C) (08/22 1621) Pulse Rate:  [90-119] 92 (08/22 1635) Resp:  [18-23] 21 (08/22 1635) BP: (96-203)/(73-113) 143/86 (08/22 1635) SpO2:  [91 %-99 %] 95 % (08/22 1635) Weight:  [66.2 kg] 66.2 kg (08/22 1554)   Physical Exam  Constitutional: Appears well-developed and well-nourished.  Psych: Affect flat, calm, cooperative Eyes: No scleral injection HENT: No oropharyngeal obstruction, moderately poor dentition.  MSK: no joint deformities.  Cardiovascular: Normal rate and regular rhythm.  Respiratory: Effort normal, non-labored breathing GI: Soft.  No distension. There is no tenderness.  Skin: There is an abrasion on her left arm  Neuro: Mental Status: Patient is awake, alert, oriented to person, place, month, year, and situation. Patient is able to give a clear and coherent history, in the limited time of my evaluation before emergent IR No signs of aphasia or neglect Cranial Nerves: II: Visual Fields are notable for some difficulty with left visual fields, particularly the left lower quadrant. Pupils are equal, round, and reactive to light.   III,IV, VI: EOMI without ptosis or diploplia.  V: Facial sensation is symmetric to temperature VII: Facial movement is notable for a left facial droop.  VIII: hearing is intact to voice X: Uvula elevates symmetrically XI: Shoulder shrug is symmetric. XII: tongue is midline without atrophy or fasciculations.  Motor: Tone is normal. Bulk is normal.  The left upper extremity is flaccid on my examination.  The left lower extremity briefly lifts off the bed but she cannot maintain it for more than 1 second.  There is no drift of the right arm or leg on NIH testing Sensory: She reports reduced sensation to the  left arm and leg Reflexes: Toes are mute on the left and down on the right Cerebellar: FNF and HKS are intact on the right  NIHSS total 12 Score breakdown:  One-point for mild right gaze preference, one-point for left lower quadrant visual field loss, one-point for left facial droop, 4 points for no movement of the left upper extremity, 2 points for being unable to resist gravity with the left lower extremity, one-point for partial sensory loss on the left, one-point for mild dysarthria, one-point for partial neglect of the left NIH stroke scale completed at the moment of patient's arrival to [facility], ED immediately before intervention.  Note completed later in the day  I have reviewed labs in epic and the results pertinent to this consultation are:  Basic Metabolic Panel: Recent Labs  Lab 02/09/21 1530  NA 137  K 3.4*  CL 103  CO2 27  GLUCOSE 114*  BUN 8  CREATININE 0.77  CALCIUM 8.3*    CBC: Recent Labs  Lab 02/09/21 1530  WBC 4.5  NEUTROABS 2.5  HGB 15.9*  HCT 46.2*  MCV 109.0*  PLT 161    Coagulation Studies: Recent Labs    02/09/21 1530  LABPROT 13.0  INR 1.0    Found to be SARS-CoV-2 positive  Ethanol level on admission undetectable  I have reviewed the images obtained:   Impression:   Per review of oncology notes, their goal for her hematocrit will be less than 42 if she had confirmed primary polycythemia vera, versus 50-55 if this is a secondary polycythemia, with a notation that increased risk of thromboembolic events are seen typically only and primary polycythemia vera.  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  Lupus anticoagulant panel, activated protein C and protein S, Antithrombin activity and specific factor activity levels should be considered on an outpatient basis - MRI brain to follow-up stroke at 24 hours post tPA - MRA of the brain without contrast to follow-up stroke - Frequent neuro checks - Echocardiogram w/ bubble study to assess for PFO given her young age - Antiplatelets per neuro interventional radiology pending results of acute intervention - Consider CT chest abdomen pelvis for malignancy screening when patient is stabilized - Risk factor modification - Telemetry monitoring; event monitor on discharge if no arrythmias captured  - Blood pressure goal   - Post successful uncomplicated revascularization SBP 120 - 140 for 24 hours; if complications have arisen or only partial revascularization reach out to interventionalist or neurologist on call for BP goal - PT consult, OT consult, Speech consult - Admitted to stroke team  #History of ethanol use -CIWA protocol without medications ordered, please reach out to CCM team if patient is becoming agitated -Thiamine 349 mg daily, folic acid 1 mg daily, multivitamin daily  #Polycythemia -Reach out to oncology in the morning for recommendations in the setting of acute thromboembolic event, may need to have further phlebotomy for lower hematocrit goal -Defer to oncology whether bone marrow biopsy may need to be completed on an inpatient basis -Consider additional malignancy screening such as CT chest abdomen pelvis pending clinical stability  #COVID-19 positive -Appreciate CCM management  Lesleigh Noe MD-PhD [facility] [phone]  Total critical care time: 35 minutes   Critical care time was exclusive of separately billable procedures and treating other patients.   Critical care was necessary to treat or prevent imminent or life-threatening deterioration.   Critical care was time  spent personally by me on the following activities: development of treatment plan with patient and/or surrogate as well as nursing, discussions with consultants/primary team, evaluation of patient's response to treatment, examination of patient, obtaining history from patient or surrogate, ordering and performing treatments and interventions, ordering and review of laboratory studies, ordering and review of radiographic studies, and re-evaluation of patient's condition as needed, as documented above.

## 2021-02-09 NOTE — Consult Note (Signed)
NAME:  Joanna Garcia, MRN:  [id number], DOB:  [birth year], LOS: 0 ADMISSION DATE:  02/09/2021 CONSULTATION DATE:  02/09/2021 REFERRING MD: de Stonecre[place]: Post-procedure vent management, COVID +   History of Present Illness:  This 45 year old woman is seen in consultation at the request of Neuro/NIR for recommendations on further evaluation and management of post-procedure ventilation and COVID infection.  Joanna Garcia is a 46 year old woman with PMHx significant for HTN, GERD, tobacco/EtOH abuse, superficial thrombophlebitis, polyneuropathy, anemia (2020, thought to be 2/2 B12/folate deficiency) and polycythemia (2022, followed by Heme/Onc) who presented to [facility] via EMS 8/22 for Code Stroke. Patient was LKW at 1430 8/22 when she had sudden onset of L-sided weakness as well as L facial droop and slurred speech.  EMS was contacted and brought patient to [facility] where CT Head was completed, showing no acute process but ?dense MCA sign. tPA was given at 1544. CTA Head/Neck demonstrated proximal R ICA occlusion with distal reconstitution, 75% stenosis of the distal R common carotid (plaque vs. thrombus), proximal R M2 severe stenosis vs. subocclusive embolus with multiple missing R MCA branch vessels, occluded L common carotid with ICA and ECA reconstitution. Decision was made to transfer patient to [facility] for NIR intervention/thrombectomy. NIHSS 5 on presentation. Of note, patient was COVID+.  Patient underwent NIR intervention 8/22 with aspiration of the R ICA and recanalization (TICI 2C); irregular walls were noted on cervical angiogram c/f ?plaque rupture, but subsequent delay angio noted new clot formation requiring aspiration x 3 with recanalization, but continued new clot formation. Cangrelor load was given and stent x 1 was placed. Given COVID+ status, remained intubated post-procedure with transfer to 4N ICU.  Pertinent Medical History:  HTN, GERD, superficial  thrombophlebitis, polyneuropathy, anemia (2020), polycythemia (2022)  Significant Hospital Events: Including procedures, antibiotic start and stop dates in addition to other pertinent events   8/22 - Acute onset of L-sided weakness/facial droop at home, LKW 1430. Presented to [facility] via EMS. CT Head NAICA, CTA with multiple areas of occlusion/stenosis. Transferred to [facility] for NIR intervention. Intubated for procedure.   Interim History / Subjective:  PCCM consulted for post-procedure vent management Awake, appropriately following commands, remains intubated Vent to PSV mode for wean Plan to extubate if able to tolerate  Objective:  Blood pressure 101/65, pulse 66, temperature 98.4 F (36.9 C), temperature source Axillary, resp. rate 17, height '5\' 5"'$  (1.651 m), last menstrual period 08/28/2017, SpO2 93 %.    Vent Mode: PRVC FiO2 (%):  [40 %-100 %] 40 % Set Rate:  [18 bmp] 18 bmp Vt Set:  [450 mL] 450 mL PEEP:  [5 cmH20] 5 cmH20 Plateau Pressure:  [16 cmH20] 16 cmH20   Intake/Output Summary (Last 24 hours) at 02/09/2021 2307 Last data filed at 02/09/2021 2200 Gross per 24 hour  Intake 1251.74 ml  Output 50 ml  Net 1201.74 ml   There were no vitals filed for this visit.  Physical Examination: General: Acutely ill-appearing middle-aged woman in NAD. HEENT: [place]/AT, anicteric sclera, PERRL, moist mucous membranes. ETT in place. Neuro:  Awake, unable to assess orientation 2/2 ETT but appears appropriate.  Responds to verbal stimuli. Following commands consistently. Moves all 4 extremities spontaneously.  CV: RRR, no m/g/r. PULM: Breathing even and unlabored on vent (PEEP 5, FiO2 40%). Lung fields CTAB anteriorly. GI: Soft, nontender, nondistended. Normoactive bowel sounds. Extremities: No LE edema noted. Skin: Warm/dry, no rashes.  Resolved Hospital Problem List     Assessment & Plan:  This 45 year old woman with stroke, COVID infection is seen in consultation at the request of  Neuro/NIR for recommendations on further evaluation and management of post-procedure ventilation and COVID infection.  R MCA occlusion s/p thrombectomy - Management per Neuro/NIR - SBP goal 120-140 - Bed rest x 6 hours s/p femoral R CFA access - S/p cangrelor load - ASA '81mg'$  + Brilinta '90mg'$  BID starting 8/23  Ventilator-dependent respiratory insufficiency post-procedure in the setting of COVID infection - Attempt PSV/vent wean tonight, 8/22 given patient's significantly improved mental status/ability to consistently follow commands - Suspect patient will be able to successfully be extubated this evening, 8/22 - Wean FiO2 for O2 sat > 90% - Daily WUA/SBT (if unable to extubate this evening) - VAP bundle - Pulmonary hygiene - PAD protocol for sedation: Propofol, wean as able for goal RASS 0 to -1 limiting in the setting of probably extubation  COVID-19 infection - Ventilation as above, hopeful for extubation this evening - Goal CVP < 4, diuresis as necessary - Follow intermittent CXR  - Solumedrol 1-'2mg'$ /kg for 3-5 days, taper to off pending O2 response - Remdesivir, trend LFT's  - Consider Baricitinib/Tocilizumab (for patients with escalating O2 needs)  Polycythemia History of superficial thrombophlebitis History of superficial thrombophlebitis, not on anticoagulation. Previous issues with B12/folate deficiency anemia in 9-05/2019, more recently with erythrocytosis to Hgb 20, followed by Heme/Onc (last seen 01/28/2021). JAK2 testing inconclusive, repeat in process. Query secondary polycythemia in the setting of tobacco use/?COPD. Epo levels low normal. Heme/Onc was planning for phlebotomy and BMBx. - Given observed clotting in tube during phlebotomy as well as persistent clot formation during thrombectomy, will likely need anticoagulation - Will discuss with Neuro/NIR as far as timing of AC initiation, given tPA administration/risk of HT as well as Brilinta/ASA intiation - Consider AC  initiation 24-48H post-procedure  Best Practice: (right click and "Reselect all SmartList Selections" daily)   Per Primary Team  Labs:  CBC: Recent Labs  Lab 02/09/21 1530 02/09/21 2137  WBC 4.5  --   NEUTROABS 2.5  --   HGB 15.9* 15.0  HCT 46.2* 44.0  MCV 109.0*  --   PLT 161  --    Basic Metabolic Panel: Recent Labs  Lab 02/09/21 1530 02/09/21 2137  NA 137 139  K 3.4* 3.8  CL 103  --   CO2 27  --   GLUCOSE 114*  --   BUN 8  --   CREATININE 0.77  --   CALCIUM 8.3*  --    GFR: Estimated Creatinine Clearance: 81.6 mL/min (by C-G formula based on SCr of 0.77 mg/dL). Recent Labs  Lab 02/09/21 1530  WBC 4.5   Liver Function Tests: Recent Labs  Lab 02/09/21 1530  AST 23  ALT 31  ALKPHOS 70  BILITOT 0.6  PROT 6.2*  ALBUMIN 3.5   No results for input(s): LIPASE, AMYLASE in the last 168 hours. No results for input(s): AMMONIA in the last 168 hours.  ABG:    Component Value Date/Time   PHART 7.369 02/09/2021 2137   PCO2ART 43.6 02/09/2021 2137   PO2ART 305 (H) 02/09/2021 2137   HCO3 25.2 02/09/2021 2137   TCO2 27 02/09/2021 2137   O2SAT 100.0 02/09/2021 2137    Coagulation Profile: Recent Labs  Lab 02/09/21 1530  INR 1.0   Cardiac Enzymes: No results for input(s): CKTOTAL, CKMB, CKMBINDEX, TROPONINI in the last 168 hours.  HbA1C: No results found for: HGBA1C  CBG: No results for input(s): GLUCAP in the  last 168 hours.  Review of Systems:   Patient is encephalopathic and/or intubated. Therefore history has been obtained from chart review.   Past Medical History:  She,  has a past medical history of DVT (deep venous thrombosis) ([place]), GERD (gastroesophageal reflux disease), Hypertension, and Polyneuropathy.   Surgical History:   Past Surgical History:  Procedure Laterality Date   COLONOSCOPY N/A 03/16/2019   Procedure: COLONOSCOPY;  Surgeon: Toledo, Benay Pike, MD;  Location: [facility] ENDOSCOPY;  Service: Gastroenterology;  Laterality: N/A;    ESOPHAGOGASTRODUODENOSCOPY N/A 03/16/2019   Procedure: ESOPHAGOGASTRODUODENOSCOPY (EGD);  Surgeon: Toledo, Benay Pike, MD;  Location: [facility] ENDOSCOPY;  Service: Gastroenterology;  Laterality: N/A;   NO PAST SURGERIES      Social History:   reports that she has been smoking cigarettes. She has been smoking an average of 1 pack per day. She has never used smokeless tobacco. She reports current alcohol use. She reports that she does not currently use drugs after having used the following drugs: Marijuana.   Family History:  Her family history includes Breast cancer in her paternal grandmother; COPD in her mother; Cancer in her father and paternal grandmother; Heart disease in her mother; Hyperlipidemia in her father and mother; Hypertension in her father and mother; Leukemia in her paternal uncle.   Allergies: No Known Allergies   Home Medications: Prior to Admission medications   Medication Sig Start Date End Date Taking? Authorizing Provider  gabapentin (NEURONTIN) 300 MG capsule Take 300 mg by mouth 5 (five) times daily.    [provider]  lisinopril-hydrochlorothiazide (ZESTORETIC) 10-12.5 MG tablet Take 1 tablet by mouth daily. Pt taking 1/2 tablet daily    [provider]  omeprazole (PRILOSEC) 40 MG capsule Take 1 capsule (40 mg total) by mouth daily. Patient taking differently: Take 40 mg by mouth 2 (two) times daily. 03/16/19   Nicholes Mango, MD    Critical care time: 40 minutes   Lestine Mount, [facility] 02/09/21 11:07 PM  Please see [email or web address] for pager details.  From 7A-7P if no response, please call [phone] After hours, please call ELink [phone]

## 2021-02-09 NOTE — Transfer of Care (Signed)
Immediate Anesthesia Transfer of Care Note  Patient: ABBIEGAIL Garcia  Procedure(s) Performed: IR WITH ANESTHESIA  Patient Location: ICU  Anesthesia Type:General  Level of Consciousness: sedated, unresponsive and Patient remains intubated per anesthesia plan  Airway & Oxygen Therapy: Patient remains intubated per anesthesia plan and Patient placed on Ventilator (see vital sign flow sheet for setting)  Post-op Assessment: Report given to RN and Post -op Vital signs reviewed and stable  Post vital signs: Reviewed and stable  Last Vitals:  Vitals Value Taken Time  BP 148/76 02/09/21 1936  Temp    Pulse 96 02/09/21 1945  Resp 18 02/09/21 1945  SpO2 100 % 02/09/21 1945  Vitals shown include unvalidated device data.  Last Pain:  Vitals:   02/09/21 1930  TempSrc: Axillary         Complications: No notable events documented.

## 2021-02-09 NOTE — Consult Note (Signed)
Neurology Consultation  Reason for Consult: stroke like symptoms, left sided weakness Referring Physician: Dr Earnest Conroy. Smith  CC: slurred speech, left sided weakness  History is obtained from: patient, chart  HPI: Joanna Garcia is a 45 y.o. female PMH of polycythemia, polyneuropathy, alcohol abuse, not on any anticoagulation, sudden onset of left-sided weakness with last known well 2:30 PM today. Patient reports that she was at home, unloading the dishwasher when she had sudden onset of weakness on the left side.  Her arm and leg gave out.  She also noticed that her left face was droopy.  Her arm and face also felt numb.  She also had slurred speech when she attempted to talk. EMS was contacted, they evaluated the patient and activated a code stroke and brought her for evaluation to [facility] where she was seen at the bridge and evaluated and taken for CT head. CT head unremarkable for acute process. Possible dense MCA sign Risks and benefits of IV tPA discussed with the patient and IV tPA was given after CT did not show any evidence of bleed-reviewed by me. Following tPA administration, CT angiography of the head and neck was done which showed proximal right ICA occlusion with distal reconstitution, 75% stenosis of the distal right common carotid with plaque versus thrombus, proximal right M2 severe stenosis or subocclusive embolus and multiple missing right MCA branch vessels, occluded left common carotid with ICA and ECA reconstitution. Case discussed with neuro interventionalist Dr. Ladean Raya and EVT offered to the patient who agreed. Prolonged delay in transfer-due to in availability of critical transport as well as inability of EMS to transport without an Therapist, sports and no RN being available.   LKW: 2:30 PM today tpa given?:  Yes Premorbid modified Rankin scale (mRS): 0 ROS: Full ROS was performed and is negative except as noted in the HPI.  Past Medical History:   Diagnosis Date   DVT (deep venous thrombosis) (HCC)    GERD (gastroesophageal reflux disease)    Hypertension    Polyneuropathy    Family History  Problem Relation Age of Onset   Hypertension Mother    Hyperlipidemia Mother    Heart disease Mother    COPD Mother    Hypertension Father    Hyperlipidemia Father    Cancer Father        prostate   Leukemia Paternal Uncle    Cancer Paternal Grandmother    Breast cancer Paternal Grandmother      Social History:   reports that she has been smoking cigarettes. She has been smoking an average of 1 pack per day. She has never used smokeless tobacco. She reports current alcohol use. She reports that she does not currently use drugs after having used the following drugs: Marijuana.  Medications  Current Facility-Administered Medications:    clevidipine (CLEVIPREX) infusion 0.5 mg/mL, 0-21 mg/hr, Intravenous, Continuous, Tamala Julian, Ida Rogue, MD   labetalol (NORMODYNE) injection 10 mg, 10 mg, Intravenous, Q30 min, Lucrezia Starch, MD, 10 mg at 02/09/21 1610   LORazepam (ATIVAN) injection 0-4 mg, 0-4 mg, Intravenous, Q6H, 1 mg at 02/09/21 1606 **OR** LORazepam (ATIVAN) tablet 0-4 mg, 0-4 mg, Oral, Q6H, Lucrezia Starch, MD   [START ON 02/12/2021] LORazepam (ATIVAN) injection 0-4 mg, 0-4 mg, Intravenous, Q12H **OR** [START ON 02/12/2021] LORazepam (ATIVAN) tablet 0-4 mg, 0-4 mg, Oral, Q12H, Lucrezia Starch, MD   thiamine tablet 100 mg, 100 mg, Oral, Daily **OR** thiamine (B-1) injection 100 mg, 100 mg, Intravenous,  Daily, Lucrezia Starch, MD  Current Outpatient Medications:    gabapentin (NEURONTIN) 300 MG capsule, Take 300 mg by mouth 5 (five) times daily., Disp: , Rfl:    lisinopril-hydrochlorothiazide (ZESTORETIC) 10-12.5 MG tablet, Take 1 tablet by mouth daily. Pt taking 1/2 tablet daily, Disp: , Rfl:    omeprazole (PRILOSEC) 40 MG capsule, Take 1 capsule (40 mg total) by mouth daily. (Patient taking differently: Take 40 mg by mouth 2 (two)  times daily.), Disp: 90 capsule, Rfl: 0   Exam: Current vital signs: BP (!) 143/86   Pulse 92   Temp 98.1 F (36.7 C)   Resp (!) 21   Ht '5\' 2"'$  (1.575 m)   Wt 66.2 kg   LMP 08/28/2017   SpO2 95%   BMI 26.70 kg/m  Vital signs in last 24 hours: Temp:  [98.1 F (36.7 C)-99.1 F (37.3 C)] 98.1 F (36.7 C) (08/22 1621) Pulse Rate:  [90-119] 92 (08/22 1635) Resp:  [18-23] 21 (08/22 1635) BP: (96-203)/(73-113) 143/86 (08/22 1635) SpO2:  [91 %-99 %] 95 % (08/22 1635) Weight:  [66.2 kg] 66.2 kg (08/22 1554) General: Awake alert in no distress HEENT: Normocephalic/atraumatic Lungs: Clear Cardiovascular: Regular rhythm Abdomen nondistended nontender Extremities warm well perfused with bruising around the lines Neurological exam Awake alert oriented x3 Mild dysarthria No evidence of aphasia Cranial nerves: Pupils equal round reactive to light, extraocular movements intact, visual fields full, left lower face weakness, tongue and palate midline. Motor exam with barely 3/5 strength in the left upper extremity, 4 -/5 in the left lower extremity, right side full strength. Sensation intact without extinction Coordination with no dysmetria on the right, mild dysmetria on the left-worsening to the weakness Gait testing deferred On presentation NIH stroke scale 5 Some fluctuation noted with some improvement in the left-sided strength but also some worsening with gaze preference to the right which resolved.    Labs I have reviewed labs in epic and the results pertinent to this consultation are:  CBC    Component Value Date/Time   WBC 4.5 02/09/2021 1530   RBC 4.24 02/09/2021 1530   HGB 15.9 (H) 02/09/2021 1530   HCT 46.2 (H) 02/09/2021 1530   PLT 161 02/09/2021 1530   MCV 109.0 (H) 02/09/2021 1530   MCH 37.5 (H) 02/09/2021 1530   MCHC 34.4 02/09/2021 1530   RDW 14.2 02/09/2021 1530   LYMPHSABS 1.5 02/09/2021 1530   MONOABS 0.5 02/09/2021 1530   EOSABS 0.1 02/09/2021 1530    BASOSABS 0.0 02/09/2021 1530    CMP     Component Value Date/Time   NA 137 02/09/2021 1530   K 3.4 (L) 02/09/2021 1530   CL 103 02/09/2021 1530   CO2 27 02/09/2021 1530   GLUCOSE 114 (H) 02/09/2021 1530   BUN 8 02/09/2021 1530   CREATININE 0.77 02/09/2021 1530   CALCIUM 8.3 (L) 02/09/2021 1530   PROT 6.2 (L) 02/09/2021 1530   ALBUMIN 3.5 02/09/2021 1530   AST 23 02/09/2021 1530   ALT 31 02/09/2021 1530   ALKPHOS 70 02/09/2021 1530   BILITOT 0.6 02/09/2021 1530   GFRNONAA >60 02/09/2021 1530   GFRAA >60 06/05/2019 1710    Imaging I have reviewed the images obtained:  CT-head-no acute changes. No bleed. Possible dense right MCA  CT angiography of the head and neck was done which showed proximal right ICA occlusion with distal reconstitution, 75% stenosis of the distal right common carotid with plaque versus thrombus, proximal right M2 severe stenosis  or subocclusive embolus and multiple missing right MCA branch vessels, occluded left common carotid with ICA and ECA reconstitution.  Assessment:  45 year old with polycythemia with sudden onset of left-sided weakness, consistent with right hemispheric stroke. No contraindication for tPA including no bleed on CT head reviewed by me personally. tPA bolus at 1544 hrs. Following the tPA, CT angiography of the head and neck done that showed proximal right ICA occlusion with distal reconstitution and 75% stenosis of the distal common carotid with possible thrombus along with proximal right M2 severe stenosis or subocclusive embolus with multiple missing right MCA branch vessels and occluded left common carotid with ICA and ECA reconstitution. Discussed with neuro interventional list-accepted for thrombectomy and accepted for admission by Dr. Curly Shores at [facility] Significant delay in transportation due to no critical care transport truck available, and inability to transport via EMS without an hour-no RN available at the hospital  to accompany the patient.  Acute ischemic stroke-likely from polycythemia  Recommendations: Post tPA vitals and neurochecks Admission to neuro ICU after intervention Emergent thrombectomy-May require stent. Further care by the neurology team and neuro endovascular team at [place] discussed with the patient's mother-Lisa Cobb in person.  She also signed the consent form for endovascular thrombectomy.  Plan also discussed with Dr. Tamala Julian in the ER, Dr. Alonza Smoker and Dr. Curly Shores over the phone via [facility]   -- Amie Portland, MD Neurologist [facility] Pager: [phone]   CRITICAL CARE ATTESTATION Performed by: Amie Portland, MD Total critical care time: 65 minutes Critical care time was exclusive of separately billable procedures and treating other patients and/or supervising APPs/Residents/Students Critical care was necessary to treat or prevent imminent or life-threatening deterioration due to acute ischemic stroke, carotid occlusion This patient is critically ill and at significant risk for neurological worsening and/or death and care requires constant monitoring. Critical care was time spent personally by me on the following activities: development of treatment plan with patient and/or surrogate as well as nursing, discussions with consultants, evaluation of patient's response to treatment, examination of patient, obtaining history from patient or surrogate, ordering and performing treatments and interventions, ordering and review of laboratory studies, ordering and review of radiographic studies, pulse oximetry, re-evaluation of patient's condition, participation in multidisciplinary rounds and medical decision making of high complexity in the care of this patient.

## 2021-02-09 NOTE — Procedures (Signed)
INTERVENTIONAL NEURORADIOLOGY BRIEF POSTPROCEDURE NOTE  DIAGNOSTIC CEREBRAL ANGIOGRAM AND MECHANICAL THROMBECTOMY  Attending: Dr. Erven Colla de Sindy Messing  Assistant: None.  Diagnosis: Right ICA occlusion, right MCA occlusion  Access site: RCFA  Access closure: 7 [place] Exoseal  Anesthesia: General anesthesia.  Medication used: '10mg'$  verapamil IA; cangrelor IV bolus and drip.  Complications: None.  Estimated blood loss: 100 mL.  Specimen: None.  Findings: Right carotid angiogram showed filling defect with near occlusion of the right carotid bifurcation. Aspiration catheter advanced into the cervical right ICA under aspiration.  Intracranial angiogram showed interval recanalization of the right M2/MCA with slow distal flow (TICI 2C). Cervical angiogram showed cervical recanalization with irregular walls suggesting possible plaque rupture. However, delay angiograms showed new clot formation. Multiple (x3) cervical aspirations performed achieving recanalization followed by new clot cormation. Intracranial angiograms remained stable.  Patient was then loaded on cangrelor and a 6-8 x 40 mm XCAT carotid stent was deployed with cerebral protection device. Delay angiogram showed continued patency of the stent with no new clot formation.  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   - Patient will remain intubated due to covid-19 + test - SBP 120-140 mmHg - Bed rest x6 hours s/p femoral artery access - Patient was loaded on ticagrelor 180 mg + ASA 81 mg - Start ticagrelor 90 mg bid + ASA 81 mg tomorrow

## 2021-02-09 NOTE — Anesthesia Postprocedure Evaluation (Signed)
Anesthesia Post Note  Patient: Joanna Garcia  Procedure(s) Performed: IR WITH ANESTHESIA     Patient location during evaluation: SICU Anesthesia Type: General Level of consciousness: sedated Pain management: pain level controlled Vital Signs Assessment: post-procedure vital signs reviewed and stable Respiratory status: patient remains intubated per anesthesia plan Cardiovascular status: stable Postop Assessment: no apparent nausea or vomiting Anesthetic complications: no   No notable events documented.  Last Vitals:  Vitals:   02/09/21 2030 02/09/21 2045  BP: 119/76 (!) 143/70  Pulse: 91 93  Resp: (!) 21 (!) 23  Temp:    SpO2: 100% 100%    Last Pain:  Vitals:   02/09/21 1930  TempSrc: Axillary                 Tiajuana Amass

## 2021-02-09 NOTE — ED Provider Notes (Signed)
[facility] Emergency Department Provider Note  ____________________________________________   Event Date/Time   First MD Initiated Contact with Patient 02/09/21 1519     (approximate)  I have reviewed the triage vital signs and the nursing notes.   HISTORY  Chief Complaint Code Stroke   HPI Joanna Garcia is a 45 y.o. female with past medical history of HTN, polyneuropathy, GERD, migraine headaches and DVT for assessment of acute onset of left sided facial and left-sided body weakness beginning around 2:30 PM.  Per EMS she had started vitals were initially normal limits throughout.  Patient is not currently on any blood thinners.  She denies any fevers, chills, cough, vomiting, diarrhea, dysuria, rash or recent injuries or falls.  She states she has had several months of some shortness of breath with exertion.  She does smoke and drinks about 2 24 ounce alcoholic beverages per day but none today.  Patient states she has never had a stroke before.         Past Medical History:  Diagnosis Date   DVT (deep venous thrombosis) (HCC)    GERD (gastroesophageal reflux disease)    Hypertension    Polyneuropathy     Patient Active Problem List   Diagnosis Date Noted   Polycythemia 01/28/2021   Elevated hemoglobin (HCC) 01/23/2021   Leukocytosis 01/23/2021   UTI (urinary tract infection) 06/05/2019   Symptomatic anemia 03/14/2019   GI bleed 03/14/2019   Superficial thrombophlebitis 02/12/2019   Thrombocytosis 02/12/2019   GERD (gastroesophageal reflux disease) 02/12/2019   Essential hypertension 02/12/2019    Past Surgical History:  Procedure Laterality Date   COLONOSCOPY N/A 03/16/2019   Procedure: COLONOSCOPY;  Surgeon: Toledo, Benay Pike, MD;  Location: [facility] ENDOSCOPY;  Service: Gastroenterology;  Laterality: N/A;   ESOPHAGOGASTRODUODENOSCOPY N/A 03/16/2019   Procedure: ESOPHAGOGASTRODUODENOSCOPY (EGD);  Surgeon: Toledo, Benay Pike, MD;  Location:  [facility] ENDOSCOPY;  Service: Gastroenterology;  Laterality: N/A;   NO PAST SURGERIES      Prior to Admission medications   Medication Sig Start Date End Date Taking? Authorizing Provider  gabapentin (NEURONTIN) 300 MG capsule Take 300 mg by mouth 5 (five) times daily.    [provider]  lisinopril-hydrochlorothiazide (ZESTORETIC) 10-12.5 MG tablet Take 1 tablet by mouth daily. Pt taking 1/2 tablet daily    [provider]  omeprazole (PRILOSEC) 40 MG capsule Take 1 capsule (40 mg total) by mouth daily. Patient taking differently: Take 40 mg by mouth 2 (two) times daily. 03/16/19   Nicholes Mango, MD    Allergies Patient has no known allergies.  Family History  Problem Relation Age of Onset   Hypertension Mother    Hyperlipidemia Mother    Heart disease Mother    COPD Mother    Hypertension Father    Hyperlipidemia Father    Cancer Father        prostate   Leukemia Paternal Uncle    Cancer Paternal Grandmother    Breast cancer Paternal Grandmother     Social History Social History   Tobacco Use   Smoking status: Every Day    Packs/day: 1.00    Types: Cigarettes   Smokeless tobacco: Never  Vaping Use   Vaping Use: Never used  Substance Use Topics   Alcohol use: Yes    Comment: 21 Oz daily   Drug use: Not Currently    Types: Marijuana    Review of Systems  Review of Systems  Constitutional:  Negative for chills and fever.  HENT:  Negative for sore throat.   Eyes:  Negative for pain.  Respiratory:  Positive for shortness of breath (chronic w/ exertion). Negative for stridor.   Cardiovascular:  Negative for chest pain.  Gastrointestinal:  Negative for vomiting.  Genitourinary:  Negative for dysuria.  Musculoskeletal:  Negative for myalgias.  Skin:  Negative for rash.  Neurological:  Positive for focal weakness, weakness and headaches. Negative for seizures and loss of consciousness.  Psychiatric/Behavioral:  Positive for substance abuse. Negative  for suicidal ideas.   All other systems reviewed and are negative.    ____________________________________________   PHYSICAL EXAM:  VITAL SIGNS: ED Triage Vitals  Enc Vitals Group     BP      Pulse      Resp      Temp      Temp src      SpO2      Weight      Height      Head Circumference      Peak Flow      Pain Score      Pain Loc      Pain Edu?      Excl. in [place]?    Vitals:   02/09/21 1630 02/09/21 1635  BP: (!) 160/101 (!) 143/86  Pulse: 91 92  Resp: 18 (!) 21  Temp:    SpO2: 97% 95%   Physical Exam Vitals and nursing note reviewed.  Constitutional:      General: She is not in acute distress.    Appearance: She is well-developed.  HENT:     Head: Normocephalic and atraumatic.     Right Ear: External ear normal.     Left Ear: External ear normal.     Nose: Nose normal.  Eyes:     Conjunctiva/sclera: Conjunctivae normal.  Cardiovascular:     Rate and Rhythm: Normal rate and regular rhythm.     Heart sounds: No murmur heard. Pulmonary:     Effort: Pulmonary effort is normal. No respiratory distress.     Breath sounds: Normal breath sounds.  Abdominal:     Palpations: Abdomen is soft.     Tenderness: There is no abdominal tenderness.  Musculoskeletal:     Cervical back: Neck supple.  Skin:    General: Skin is warm and dry.     Capillary Refill: Capillary refill takes less than 2 seconds.  Neurological:     Mental Status: She is alert and oriented to person, place, and time.     Cranial Nerves: Cranial nerve deficit present.     Motor: Weakness present.  Psychiatric:        Mood and Affect: Mood normal.    Left-sided facial droop.  Patient is weaker in the left upper extremity and left lower extremity compared to the right.  PERRLA.  EOMI. ____________________________________________   LABS (all labs ordered are listed, but only abnormal results are displayed)  Labs Reviewed  CBC - Abnormal; Notable for the following components:      Result  Value   Hemoglobin 15.9 (*)    HCT 46.2 (*)    MCV 109.0 (*)    MCH 37.5 (*)    All other components within normal limits  COMPREHENSIVE METABOLIC PANEL - Abnormal; Notable for the following components:   Potassium 3.4 (*)    Glucose, Bld 114 (*)    Calcium 8.3 (*)    Total Protein 6.2 (*)    All other components within normal limits  RESP PANEL BY RT-PCR (FLU A&B, COVID) ARPGX2  PROTIME-INR  APTT  DIFFERENTIAL  ETHANOL  URINE DRUG SCREEN, QUALITATIVE ([facility] ONLY)  URINALYSIS, ROUTINE W REFLEX MICROSCOPIC  POC URINE PREG, ED   ____________________________________________  EKG  Sinus tachycardia with ventricular rate of 118, QTc interval of 506, otherwise unremarkable intervals and no significant artifact diffuse ST changes throughout.  Right axis deviation. ____________________________________________  RADIOLOGY  ED MD interpretation: He had without contrast remarkable for interval small chronic right frontal parietal cortical stroke.  No definite acute CVA or intracranial hemorrhage.  There is hyperdensity around the right MCA.  CTA head and neck markable for proximal occlusion right ICA with distal reconstitution.  75% gnosis of the distal right common carotid with some plaque and possible internal luminal thrombus.  There is a proximal right M2 severe stenosis and possible subocclusive embolus with multiple missing right MCA branches.  Occluded left common carotid with ICA and ECA reconstitution.  Mild to moderate left vertebral artery origin stenosis.  There is also evidence of some emphysema.  Official radiology report(s): CT HEAD CODE STROKE WO CONTRAST  Result Date: 02/09/2021 CLINICAL DATA:  Code stroke. Neuro deficit, acute, stroke suspected. Left-sided weakness and slurred speech. EXAM: CT HEAD WITHOUT CONTRAST TECHNIQUE: Contiguous axial images were obtained from the base of the skull through the vertex without intravenous contrast. COMPARISON:  08/31/2009 FINDINGS:  Brain: A small chronic right frontoparietal cortical infarct is new. No definite acute infarct, intracranial hemorrhage, mass, midline shift, or extra-axial fluid collection is identified. The ventricles are normal in size. Vascular: Hyperdense right MCA. Skull: No fracture or suspicious osseous lesion. Sinuses/Orbits: Visualized paranasal sinuses and mastoid air cells are clear. Unremarkable orbits. Other: None. ASPECTS [facility] Stroke Program Early CT Score) - Ganglionic level infarction (caudate, lentiform nuclei, internal capsule, insula, M1-M3 cortex): 7 - Supraganglionic infarction (M4-M6 cortex): 3 Total score (0-10 with 10 being normal): 10 IMPRESSION: 1. 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 2. No definite acute infarct or intracranial hemorrhage. 3. Hyperdense right MCA.  CTA is pending. These results were communicated to Dr. Rory Percy at 3:42 pm on 02/09/2021 by text page via the [facility] messaging system. Electronically Signed   By: Logan Bores M.D.   On: 02/09/2021 15:42   CT ANGIO HEAD NECK W WO CM (CODE STROKE)  Result Date: 02/09/2021 CLINICAL DATA:  Left-sided weakness and slurred speech. EXAM: CT ANGIOGRAPHY HEAD AND NECK TECHNIQUE: Multidetector CT imaging of the head and neck was performed using the standard protocol during bolus administration of intravenous contrast. Multiplanar CT image reconstructions and MIPs were obtained to evaluate the vascular anatomy. Carotid stenosis measurements (when applicable) are obtained utilizing NASCET criteria, using the distal internal carotid diameter as the denominator. CONTRAST:  34m OMNIPAQUE IOHEXOL 350 MG/ML SOLN COMPARISON:  None. FINDINGS: CTA NECK FINDINGS Aortic arch: Normal variant aortic arch branching pattern with common origin of the brachiocephalic and left common carotid arteries. The brachiocephalic and subclavian arteries are patent although streak artifact limits assessment of the proximal right subclavian artery.  Right carotid system: The common carotid artery is patent with calcified and soft plaque as well as possible intraluminal thrombus in the distal common carotid artery resulting in approximately 75% stenosis. The internal carotid artery is occluded just beyond its origin with faint reconstitution just below the skull base. Left carotid system: The common carotid artery is occluded. There is reconstitution of the internal and external carotid arteries at the carotid bifurcation. The cervical ICA is patent and uniform  in caliber. Vertebral arteries: The vertebral arteries are patent with the left being strongly dominant. Partially calcified plaque at the left vertebral origin results in mild-to-moderate stenosis. Skeleton: Advanced facet arthrosis at C3-4.  Edentulous. Other neck: 1 cm calcified right thyroid nodule for which no imaging follow-up is recommended. Upper chest: Emphysema with biapical bullae. Review of the MIP images confirms the above findings CTA HEAD FINDINGS Anterior circulation: The intracranial right ICA is patent with a thready appearance of the petrous segment and more robust opacification of the cavernous segment through the terminus. There is mild narrowing of the right paraclinoid ICA. The intracranial left ICA is patent without a significant stenosis. The right M1 segment is widely patent, however there is a severe stenosis or subocclusive embolus involving the proximal right M2 inferior division with multiple missing smaller branch vessels. The left MCA and both ACAs are patent without evidence of a significant proximal stenosis. No aneurysm is identified. Posterior circulation: The intracranial vertebral arteries are patent to the basilar with the right being particularly diminutive distal to the PICA origin. There is minimal nonstenotic plaque in the left V4 segment. The basilar artery is widely patent. There are patent posterior communicating arteries bilaterally. Both PCAs are patent  without evidence of a significant proximal stenosis. No aneurysm is identified. Venous sinuses: As permitted by contrast timing, patent. Anatomic variants: Strongly dominant left vertebral artery. Review of the MIP images confirms the above findings IMPRESSION: 1. 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 2. 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 3. Proximal right M2 severe stenosis or subocclusive embolus with multiple missing right MCA branch vessels. 4. 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 5. Mild-to-moderate left vertebral artery origin stenosis. 6.  Emphysema (ICD10-J43.9). These results were called by telephone at the time of interpretation on 02/09/2021 at 3:49 pm to Dr. Amie [place], who verbally acknowledged these results. Electronically Signed   By: Logan Bores M.D.   On: 02/09/2021 16:07    ____________________________________________   PROCEDURES  Procedure(s) performed (including Critical Care):  .Critical Care  Date/Time: 02/09/2021 3:47 PM Performed by: Lucrezia Starch, MD Authorized by: Lucrezia Starch, MD   Critical care provider statement:    Critical care time (minutes):  45   Critical care was necessary to treat or prevent imminent or life-threatening deterioration of the following conditions:  CNS failure or compromise   Critical care was time spent personally by me on the following activities:  Discussions with consultants, evaluation of patient's response to treatment, examination of patient, ordering and performing treatments and interventions, ordering and review of laboratory studies, ordering and review of radiographic studies, pulse oximetry, re-evaluation of patient's condition, obtaining history from patient or surrogate and review of old charts   ____________________________________________   INITIAL IMPRESSION / [place] / ED  COURSE     Patient presents with above-stated history exam for assessment for assessment of acute onset of left hemibody weakness around 2:30 PM.  On arrival patient is hypertensive with a BP of 164/98 and is tachycardic at 119 with otherwise stable vital signs on room air.  On exam she does have left-sided facial droop and fairly gross weakness in the left upper extremity compared to the right with more subtle weakness in left lower extremity on exam.  CBC remarkable for no leukocytosis and hemoglobin of 15.9 and platelets of 161.  CMP remarkable for K3.4 without  any other significant derangements.  INR 1.0.  Patient was made a code stroke on arrival.  She was immediately taken to CT and neurology was immediately bedside.  CT and exam is concerning for acute ischemic stroke.  tPA ordered by neurology.  Plan for transfer to [facility] for interventional procedure.  Patient excepted by Dr. Curly [place].  History exam is otherwise not consistent with acute trauma, significant metabolic derangement or acute infectious process.  Transferred in critical condition.      ____________________________________________   FINAL CLINICAL IMPRESSION(S) / ED DIAGNOSES  Final diagnoses:  Weakness    Medications  LORazepam (ATIVAN) injection 0-4 mg (1 mg Intravenous Given 02/09/21 1606)    Or  LORazepam (ATIVAN) tablet 0-4 mg ( Oral See Alternative 02/09/21 1606)  LORazepam (ATIVAN) injection 0-4 mg (has no administration in time range)    Or  LORazepam (ATIVAN) tablet 0-4 mg (has no administration in time range)  thiamine tablet 100 mg (has no administration in time range)    Or  thiamine (B-1) injection 100 mg (has no administration in time range)  labetalol (NORMODYNE) injection 10 mg (10 mg Intravenous Given 02/09/21 1610)  clevidipine (CLEVIPREX) infusion 0.5 mg/mL (has no administration in time range)  iohexol (OMNIPAQUE) 350 MG/ML injection 75 mL (75 mLs Intravenous Contrast Given 02/09/21 1534)   alteplase (ACTIVASE) 1 mg/mL infusion SOLN 61.1 mg (0 mg Intravenous Stopped 02/09/21 1645)    Followed by  0.9 %  sodium chloride infusion (0 mLs Intravenous Paused 02/09/21 1649)     ED Discharge Orders     None        Note:  This document was prepared using Dragon voice recognition software and may include unintentional dictation errors.    Lucrezia Starch, MD 02/09/21 [phone]

## 2021-02-09 NOTE — Progress Notes (Signed)
Patient transported on ventilator from IR to [id number] with no complications noted.

## 2021-02-09 NOTE — Procedures (Signed)
Extubation Procedure Note  Patient Details:   Name: Joanna Garcia DOB: 05-20-1976 MRN: [id number]   Airway Documentation:    Vent end date: (not recorded) Vent end time: (not recorded)   Evaluation  O2 sats: stable throughout Complications: No apparent complications Patient did tolerate procedure well. Bilateral Breath Sounds: Clear, Diminished   Yes   Patient following commands and has cuff leak. Pt weaned on PS 8/+5 PEEP for approx 1 hour.  RT extuabted patient per MD order at 2340. Patient able to speak. RT placed pt on 2L [place].   Alvera Singh 02/09/2021, 11:53 PM

## 2021-02-09 NOTE — ED Triage Notes (Signed)
Pt presents to the ED as Code stroke. Pt symptoms started around 1430 today. Pt reports fumbling things, with left sided facial droop, and left sided weakness. Pt arrives with same symptoms. Pt alert and oriented.

## 2021-02-10 ENCOUNTER — Inpatient Hospital Stay (HOSPITAL_COMMUNITY): Payer: Self-pay

## 2021-02-10 ENCOUNTER — Encounter (HOSPITAL_COMMUNITY): Payer: Self-pay | Admitting: Radiology

## 2021-02-10 DIAGNOSIS — Z978 Presence of other specified devices: Secondary | ICD-10-CM

## 2021-02-10 DIAGNOSIS — U071 COVID-19: Secondary | ICD-10-CM | POA: Diagnosis present

## 2021-02-10 DIAGNOSIS — F1010 Alcohol abuse, uncomplicated: Secondary | ICD-10-CM

## 2021-02-10 DIAGNOSIS — D751 Secondary polycythemia: Secondary | ICD-10-CM

## 2021-02-10 DIAGNOSIS — I63511 Cerebral infarction due to unspecified occlusion or stenosis of right middle cerebral artery: Secondary | ICD-10-CM

## 2021-02-10 DIAGNOSIS — I6521 Occlusion and stenosis of right carotid artery: Secondary | ICD-10-CM

## 2021-02-10 DIAGNOSIS — F17200 Nicotine dependence, unspecified, uncomplicated: Secondary | ICD-10-CM

## 2021-02-10 DIAGNOSIS — I6522 Occlusion and stenosis of left carotid artery: Secondary | ICD-10-CM

## 2021-02-10 DIAGNOSIS — J441 Chronic obstructive pulmonary disease with (acute) exacerbation: Secondary | ICD-10-CM

## 2021-02-10 LAB — URINALYSIS, COMPLETE (UACMP) WITH MICROSCOPIC
Bilirubin Urine: NEGATIVE
Glucose, UA: NEGATIVE mg/dL
Ketones, ur: NEGATIVE mg/dL
Leukocytes,Ua: NEGATIVE
Nitrite: NEGATIVE
Protein, ur: NEGATIVE mg/dL
Specific Gravity, Urine: 1.033 — ABNORMAL HIGH (ref 1.005–1.030)
pH: 6 (ref 5.0–8.0)

## 2021-02-10 LAB — LIPID PANEL
Cholesterol: 173 mg/dL (ref 0–200)
HDL: 33 mg/dL — ABNORMAL LOW (ref >40)
LDL Cholesterol: 114 mg/dL — ABNORMAL HIGH (ref 0–99)
Total CHOL/HDL Ratio: 5.2 RATIO
Triglycerides: 129 mg/dL (ref <150)
VLDL: 26 mg/dL (ref 0–40)

## 2021-02-10 LAB — COMPREHENSIVE METABOLIC PANEL
ALT: 25 U/L (ref 0–44)
AST: 25 U/L (ref 15–41)
Albumin: 2.8 g/dL — ABNORMAL LOW (ref 3.5–5.0)
Alkaline Phosphatase: 59 U/L (ref 38–126)
Anion gap: 9 (ref 5–15)
BUN: 8 mg/dL (ref 6–20)
CO2: 23 mmol/L (ref 22–32)
Calcium: 8.3 mg/dL — ABNORMAL LOW (ref 8.9–10.3)
Chloride: 107 mmol/L (ref 98–111)
Creatinine, Ser: 0.8 mg/dL (ref 0.44–1.00)
GFR, Estimated: >60 mL/min (ref >60)
Glucose, Bld: 139 mg/dL — ABNORMAL HIGH (ref 70–99)
Potassium: 3.8 mmol/L (ref 3.5–5.1)
Sodium: 139 mmol/L (ref 135–145)
Total Bilirubin: 0.3 mg/dL (ref 0.3–1.2)
Total Protein: 5.4 g/dL — ABNORMAL LOW (ref 6.5–8.1)

## 2021-02-10 LAB — CBC
HCT: 41.7 % (ref 36.0–46.0)
Hemoglobin: 13.7 g/dL (ref 12.0–15.0)
MCH: 36.8 pg — ABNORMAL HIGH (ref 26.0–34.0)
MCHC: 32.9 g/dL (ref 30.0–36.0)
MCV: 112.1 fL — ABNORMAL HIGH (ref 80.0–100.0)
Platelets: 171 10*3/uL (ref 150–400)
RBC: 3.72 MIL/uL — ABNORMAL LOW (ref 3.87–5.11)
RDW: 14.4 % (ref 11.5–15.5)
WBC: 7.6 10*3/uL (ref 4.0–10.5)
nRBC: 0 % (ref 0.0–0.2)

## 2021-02-10 LAB — RAPID URINE DRUG SCREEN, HOSP PERFORMED
Amphetamines: NOT DETECTED
Barbiturates: NOT DETECTED
Benzodiazepines: NOT DETECTED
Cocaine: NOT DETECTED
Opiates: NOT DETECTED
Tetrahydrocannabinol: NOT DETECTED

## 2021-02-10 LAB — ECHOCARDIOGRAM COMPLETE
Area-P 1/2: 4.49 cm2
S' Lateral: 3.2 cm

## 2021-02-10 LAB — TRIGLYCERIDES: Triglycerides: 133 mg/dL (ref <150)

## 2021-02-10 LAB — PROTIME-INR
INR: 1.1 (ref 0.8–1.2)
Prothrombin Time: 14.3 seconds (ref 11.4–15.2)

## 2021-02-10 LAB — HEMOGLOBIN A1C
Hgb A1c MFr Bld: 6.2 % — ABNORMAL HIGH (ref 4.8–5.6)
Mean Plasma Glucose: 131.24 mg/dL

## 2021-02-10 LAB — GLUCOSE, CAPILLARY
Glucose-Capillary: 157 mg/dL — ABNORMAL HIGH (ref 70–99)
Glucose-Capillary: 145 mg/dL — ABNORMAL HIGH (ref 70–99)
Glucose-Capillary: 122 mg/dL — ABNORMAL HIGH (ref 70–99)
Glucose-Capillary: 93 mg/dL (ref 70–99)

## 2021-02-10 LAB — PREGNANCY, URINE: Preg Test, Ur: NEGATIVE

## 2021-02-10 MED ORDER — PREDNISONE 20 MG PO TABS: 50.0000 mg | ORAL_TABLET | Freq: Every day | ORAL | Status: DC

## 2021-02-10 MED ORDER — SODIUM CHLORIDE 0.9 % IV SOLN
200.0000 mg | Freq: Once | INTRAVENOUS | Status: AC
  Administered 2021-02-10: 200 mg via INTRAVENOUS
  Filled 2021-02-10: qty 40

## 2021-02-10 MED ORDER — FOLIC ACID 1 MG PO TABS
1.0000 mg | ORAL_TABLET | Freq: Every day | ORAL | Status: DC
  Administered 2021-02-10 – 2021-02-11 (×2): 1 mg via ORAL
  Filled 2021-02-10: qty 1

## 2021-02-10 MED ORDER — INSULIN ASPART 100 UNIT/ML IJ SOLN
0.0000 [IU] | Freq: Three times a day (TID) | INTRAMUSCULAR | Status: DC
  Administered 2021-02-10: 2 [IU] via SUBCUTANEOUS

## 2021-02-10 MED ORDER — ADULT MULTIVITAMIN W/MINERALS CH: 1.0000 | ORAL_TABLET | Freq: Every day | ORAL | Status: DC

## 2021-02-10 MED ORDER — THIAMINE HCL 100 MG PO TABS: 100.0000 mg | ORAL_TABLET | Freq: Every day | ORAL | Status: DC

## 2021-02-10 MED ORDER — ADULT MULTIVITAMIN W/MINERALS CH
1.0000 | ORAL_TABLET | Freq: Every day | ORAL | Status: DC
  Administered 2021-02-10 – 2021-02-11 (×2): 1 via ORAL
  Filled 2021-02-10: qty 1

## 2021-02-10 MED ORDER — NICOTINE 7 MG/24HR TD PT24
7.0000 mg | MEDICATED_PATCH | Freq: Every day | TRANSDERMAL | Status: DC | PRN
  Filled 2021-02-10: qty 1

## 2021-02-10 MED ORDER — METHYLPREDNISOLONE SODIUM SUCC 125 MG IJ SOLR: 1.0000 mg/kg | Freq: Two times a day (BID) | INTRAMUSCULAR | Status: DC

## 2021-02-10 MED ORDER — BUDESONIDE 0.5 MG/2ML IN SUSP: 0.5000 mg | Freq: Two times a day (BID) | RESPIRATORY_TRACT | Status: DC

## 2021-02-10 MED ORDER — GABAPENTIN 300 MG PO CAPS
300.0000 mg | ORAL_CAPSULE | Freq: Four times a day (QID) | ORAL | Status: DC
  Administered 2021-02-10 – 2021-02-11 (×6): 300 mg via ORAL
  Filled 2021-02-10 (×6): qty 1

## 2021-02-10 MED ORDER — ATORVASTATIN CALCIUM 40 MG PO TABS
40.0000 mg | ORAL_TABLET | Freq: Every day | ORAL | Status: DC
  Administered 2021-02-10 – 2021-02-11 (×2): 40 mg via ORAL
  Filled 2021-02-10 (×2): qty 1

## 2021-02-10 MED ORDER — ALBUTEROL SULFATE HFA 108 (90 BASE) MCG/ACT IN AERS
2.0000 | INHALATION_SPRAY | RESPIRATORY_TRACT | Status: DC | PRN
  Filled 2021-02-10: qty 6.7

## 2021-02-10 MED ORDER — TICAGRELOR 90 MG PO TABS
90.0000 mg | ORAL_TABLET | Freq: Two times a day (BID) | ORAL | Status: DC
  Administered 2021-02-10 – 2021-02-11 (×3): 90 mg via ORAL
  Filled 2021-02-10 (×3): qty 1

## 2021-02-10 MED ORDER — IPRATROPIUM-ALBUTEROL 0.5-2.5 (3) MG/3ML IN SOLN: 3.0000 mL | Freq: Four times a day (QID) | RESPIRATORY_TRACT | Status: DC

## 2021-02-10 MED ORDER — LORAZEPAM 1 MG PO TABS
1.0000 mg | ORAL_TABLET | ORAL | Status: DC | PRN
Start: 2021-02-10 — End: 2021-02-11
  Administered 2021-02-10: 2 mg via ORAL
  Filled 2021-02-10: qty 2

## 2021-02-10 MED ORDER — INSULIN ASPART 100 UNIT/ML IJ SOLN
0.0000 [IU] | INTRAMUSCULAR | Status: DC
  Administered 2021-02-10: 2 [IU] via SUBCUTANEOUS
  Administered 2021-02-10: 3 [IU] via SUBCUTANEOUS

## 2021-02-10 MED ORDER — NICOTINE 14 MG/24HR TD PT24
14.0000 mg | MEDICATED_PATCH | Freq: Every day | TRANSDERMAL | Status: DC | PRN
  Administered 2021-02-10 – 2021-02-11 (×2): 14 mg via TRANSDERMAL
  Filled 2021-02-10 (×3): qty 1

## 2021-02-10 MED ORDER — THIAMINE HCL 100 MG PO TABS
100.0000 mg | ORAL_TABLET | Freq: Every day | ORAL | Status: DC
  Administered 2021-02-10 – 2021-02-11 (×2): 100 mg via ORAL
  Filled 2021-02-10: qty 1

## 2021-02-10 MED ORDER — SODIUM CHLORIDE 0.9 % IV SOLN
100.0000 mg | Freq: Every day | INTRAVENOUS | Status: AC
  Administered 2021-02-11: 100 mg via INTRAVENOUS
  Filled 2021-02-10: qty 100

## 2021-02-10 MED ORDER — METHYLPREDNISOLONE SODIUM SUCC 40 MG IJ SOLR
0.5000 mg/kg | Freq: Two times a day (BID) | INTRAMUSCULAR | Status: DC
  Administered 2021-02-10: 33.2 mg via INTRAVENOUS
  Filled 2021-02-10: qty 1

## 2021-02-10 MED ORDER — ALBUTEROL SULFATE (2.5 MG/3ML) 0.083% IN NEBU: 2.5000 mg | INHALATION_SOLUTION | RESPIRATORY_TRACT | Status: DC | PRN

## 2021-02-10 MED ORDER — ORAL CARE MOUTH RINSE
15.0000 mL | Freq: Two times a day (BID) | OROMUCOSAL | Status: DC
  Administered 2021-02-10 – 2021-02-11 (×3): 15 mL via OROMUCOSAL

## 2021-02-10 MED ORDER — SENNOSIDES-DOCUSATE SODIUM 8.6-50 MG PO TABS: 1.0000 | ORAL_TABLET | Freq: Every evening | ORAL | Status: DC | PRN

## 2021-02-10 MED ORDER — LORAZEPAM 2 MG/ML IJ SOLN: 1.0000 mg | INTRAMUSCULAR | Status: DC | PRN

## 2021-02-10 NOTE — Progress Notes (Signed)
Per Dr. Erlinda Hong, new BP parameters goals SBP< 180/105

## 2021-02-10 NOTE — Evaluation (Signed)
Physical Therapy Evaluation Patient Details Name: Joanna Garcia MRN: [id number] DOB: 1975-07-29 Today's Date: 02/10/2021   History of Present Illness  45 yo female admitted with R MCA and revascularization to [place] + PMH HTN GERD etoh/ tobacco use,  Clinical Impression  PTA, pt lives with her mother and is independent. Pt presents with mildly decreased left upper extremity coordination/dysmetria and changes in sensation distally. Pt reports history of peripheral neuropathy in bilateral feet. Ambulating room distances with no assistive device at a supervision level. Unfortunately, limited in further distance due to airborne precautions. BP 120/79 sitting edge of bed, 128/79 sitting on toilet. No PT follow up anticipated; will follow acutely to continue to assess.    Follow Up Recommendations No PT follow up    Equipment Recommendations  None recommended by PT    Recommendations for Other Services       Precautions / Restrictions Precautions Precautions: SBP 120-140 Restrictions Weight Bearing Restrictions: No      Mobility  Bed Mobility Overal bed mobility: Modified Independent                  Transfers Overall transfer level: Needs assistance Equipment used: None Transfers: Sit to/from Stand Sit to Stand: Supervision            Ambulation/Gait Ambulation/Gait assistance: Supervision Gait Distance (Feet): 30 Feet Assistive device: None Gait Pattern/deviations: Step-through pattern;Decreased stride length     General Gait Details: Slow and guarded pace (likely due to lines), supervision for safety, no gross instability noted  Stairs            Wheelchair Mobility    Modified Rankin (Stroke Patients Only) Modified Rankin (Stroke Patients Only) Pre-Morbid Rankin Score: No symptoms Modified Rankin: Moderately severe disability     Balance Overall balance assessment: Mild deficits observed, not formally tested                                            Pertinent Vitals/Pain Pain Assessment: No/denies pain    Home Living Family/patient expects to be discharged to:: Private residence Living Arrangements: Parent (mother) Available Help at Discharge: Family Type of Home: House Home Access: Stairs to enter   Technical brewer of Steps: 2 Home Layout: One level Home Equipment: None Additional Comments: 2 dogs Charlie and Clauie    Prior Function Level of Independence: Independent         Comments: Not working, independent, driving     Hand Dominance   Dominant Hand: Right    Extremity/Trunk Assessment   Upper Extremity Assessment Upper Extremity Assessment: Defer to OT evaluation    Lower Extremity Assessment Lower Extremity Assessment: RLE deficits/detail;LLE deficits/detail RLE Deficits / Details: Strength 5/5 RLE Sensation: history of peripheral neuropathy LLE Deficits / Details: Strength 5/5 LLE Sensation: history of peripheral neuropathy    Cervical / Trunk Assessment Cervical / Trunk Assessment: Normal  Communication   Communication: Other (comment) (?mild word finding difficulties)  Cognition Arousal/Alertness: Awake/alert Behavior During Therapy: WFL for tasks assessed/performed Overall Cognitive Status: Within Functional Limits for tasks assessed Area of Impairment:  (word retrieveal several times during session)                               General Comments: OT to further assess higher executive tasks  General Comments      Exercises     Assessment/Plan    PT Assessment Patient needs continued PT services  PT Problem List Decreased balance;Decreased mobility;Decreased coordination       PT Treatment Interventions Gait training;Functional mobility training;Therapeutic activities;Therapeutic exercise;Balance training;Patient/family education    PT Goals (Current goals can be found in the Care Plan section)  Acute Rehab PT  Goals Patient Stated Goal: get some rest PT Goal Formulation: With patient Time For Goal Achievement: 02/24/21 Potential to Achieve Goals: Good    Frequency Min 4X/week   Barriers to discharge        Co-evaluation PT/OT/SLP Co-Evaluation/Treatment: Yes Reason for Co-Treatment: For patient/therapist safety;To address functional/ADL transfers PT goals addressed during session: Mobility/safety with mobility OT goals addressed during session: ADL's and self-care       AM-PAC PT "6 Clicks" Mobility  Outcome Measure Help needed turning from your back to your side while in a flat bed without using bedrails?: None Help needed moving from lying on your back to sitting on the side of a flat bed without using bedrails?: None Help needed moving to and from a bed to a chair (including a wheelchair)?: A Little Help needed standing up from a chair using your arms (e.g., wheelchair or bedside chair)?: A Little Help needed to walk in hospital room?: A Little Help needed climbing 3-5 steps with a railing? : A Little 6 Click Score: 20    End of Session Equipment Utilized During Treatment: Gait belt Activity Tolerance: Patient tolerated treatment well Patient left: with call bell/phone within reach;in bed;Other (comment) (with echo) Nurse Communication: Mobility status PT Visit Diagnosis: Unsteadiness on feet (R26.81)    Time: [id number] PT Time Calculation (min) (ACUTE ONLY): 30 min   Charges:   PT Evaluation $PT Eval Low Complexity: [place], PT, DPT Acute Rehabilitation Services Pager [phone] Office [phone]   Deno Etienne 02/10/2021, 3:20 PM

## 2021-02-10 NOTE — Progress Notes (Signed)
OT EVALUATION  PT admitted with CVA with revascularization. Pt currently with functional limitiations due to the deficits listed below (see OT problem list). Pt currently supevision to min guard level for adls. Pt noted to have word finding delays and expressed slower processing than normal. Pt also states her transfers are a decreased gait velocity than baseline. Pt with L UE fine motor and sensation changes.  Pt will benefit from skilled OT to increase their independence and safety with adls and balance to allow discharge outpatient [place] pending progress.   02/10/21 1500  OT Visit Information  Last OT Received On 02/10/21  Assistance Needed +1  PT/OT/SLP Co-Evaluation/Treatment Yes  Reason for Co-Treatment To address functional/ADL transfers  OT goals addressed during session ADL's and self-care  History of Present Illness 45 yo female admitted with R MCA  TPA givenand revascularization to [place] + PMH HTN GERD etoh/ tobacco use, superficial thrombophlebitis, polyneuropathy, anemia polycythemia  Precautions  Precautions Fall  Home Living  Family/patient expects to be discharged to: Private residence  Living Arrangements Parent  Available Help at Discharge Family  Type of [place] to enter  Entrance Stairs-Number of Steps 2  [place] One level  Bathroom Shower/Tub Tub/shower unit  [place] None  Additional Comments 2 dogs Charlie and Clauie  Prior Function  Level of Independence Independent  Comments does not work and does the cooking / cleaning at the home  Communication  Communication Expressive difficulties  Pain Assessment  Pain Assessment No/denies pain  Cognition  Arousal/Alertness Awake/alert  Behavior During Therapy WFL for tasks assessed/performed  Overall Cognitive Status Impaired/Different from baseline  Area of Impairment  (word retrieveal several times during session)  General Comments pt oriented to  place time and situation. pt expressed that thinking process was slower than normal  Upper Extremity Assessment  Upper Extremity Assessment LUE deficits/detail  LUE Deficits / Details pt expressed changes in sensation  LUE Sensation decreased light touch  LUE Coordination decreased fine motor;decreased gross motor  Lower Extremity Assessment  Lower Extremity Assessment Defer to PT evaluation  Cervical / Trunk Assessment  Cervical / Trunk Assessment Normal  ADL  Overall ADL's  Needs assistance/impaired  Grooming Wash/dry hands;Supervision/safety;Standing  Grooming Details (indicate cue type and reason) sink level  Armed forces technical officer Min guard;Ambulation;Regular Toilet  Toileting- Water quality scientist and Hygiene Supervision/safety;Sitting/lateral lean  Functional mobility during ADLs Supervision/safety  General ADL Comments pt expressed some soreness at revascularization site but overall minimal to no pain  Vision- History  Baseline Vision/History 1 Wears glasses  Ability to See in Adequate Light 0 Adequate  Patient Visual Report No change from baseline  Vision- Assessment  Vision Assessment? Yes  Eye Alignment WFL  Ocular Range of Motion [facility]  Alignment/Gaze Preference WDL  Tracking/Visual Pursuits Able to track stimulus in all quads without difficulty  Convergence WFL  Visual Fields No apparent deficits  Bed Mobility  Overal bed mobility Modified Independent  Transfers  Overall transfer level Needs assistance  Transfers Sit to/from Stand  Sit to Stand Supervision  Balance  Overall balance assessment Mild deficits observed, not formally tested (self reports decreased gait velocity)  General Comments  General comments (skin integrity, edema, etc.) VSS BP monitored during session  OT - End of Session  Equipment Utilized During Treatment Gait belt  Activity Tolerance Patient tolerated treatment well  Patient left in bed (due to ECHO arriving and not able to stay in chair)   Nurse  Communication Mobility status;Precautions  OT Assessment  OT Recommendation/Assessment Patient needs continued OT Services  OT Visit Diagnosis Unsteadiness on feet (R26.81);Muscle weakness (generalized) (M62.81)  OT Problem List Impaired balance (sitting and/or standing);Decreased cognition;Decreased knowledge of precautions  OT Plan  OT Frequency (ACUTE ONLY) Min 2X/week  OT Treatment/Interventions (ACUTE ONLY) Self-care/ADL training;Therapeutic exercise;Neuromuscular education;DME and/or AE instruction;Manual therapy;Therapeutic activities;Cognitive remediation/compensation;Patient/family education;Balance training  AM-PAC OT "6 Clicks" Daily Activity Outcome Measure (Version 2)  Help from another person eating meals? 3  Help from another person taking care of personal grooming? 3  Help from another person toileting, which includes using toliet, bedpan, or urinal? 3  Help from another person bathing (including washing, rinsing, drying)? 3  Help from another person to put on and taking off regular upper body clothing? 3  Help from another person to put on and taking off regular lower body clothing? 3  6 Click Score 18  Progressive Mobility  What is the highest level of mobility based on the progressive mobility assessment? Level 4 (Walks with assist in room) - Balance while marching in place and cannot step forward and back - Complete  Mobility Ambulated with assistance in room  OT Recommendation  Recommendations for Other Services Speech consult  Follow Up Recommendations Outpatient OT  OT Equipment None recommended by OT  Individuals Consulted  Consulted and Agree with Results and Recommendations Patient  Acute Rehab OT Goals  Patient Stated Goal to get back home to mother  OT Goal Formulation With patient  Time For Goal Achievement 02/24/21  Potential to Achieve Goals Good  OT Time Calculation  OT Start Time (ACUTE ONLY) 1154  OT Stop Time (ACUTE ONLY) 1219  OT Time  Calculation (min) 25 min  OT General Charges  $OT Visit 1 Visit  OT Evaluation  $OT Eval Moderate Complexity 1 Mod  Written Expression  Dominant Hand Right   Brynn, OTR/L  Acute Rehabilitation Services Pager: [phone] Office: [phone] .

## 2021-02-10 NOTE — Consult Note (Signed)
NAME:  Joanna Garcia, MRN:  [id number], DOB:  Sep 06, 1975, LOS: 1 ADMISSION DATE:  02/09/2021 CONSULTATION DATE:  02/09/2021 REFERRING MD: de [place]: Post-procedure vent management, COVID +   History of Present Illness:  This 45 year old woman is seen in consultation at the request of Neuro/NIR for recommendations on further evaluation and management of post-procedure ventilation and COVID infection.  Joanna Garcia is a 45 year old woman with PMHx significant for HTN, GERD, tobacco/EtOH abuse, superficial thrombophlebitis, polyneuropathy, anemia (2020, thought to be 2/2 B12/folate deficiency) and polycythemia (2022, followed by Heme/Onc) who presented to [facility] via EMS 8/22 for Code Stroke. Patient was LKW at 1430 8/22 when she had sudden onset of L-sided weakness as well as L facial droop and slurred speech.  EMS was contacted and brought patient to [facility] where CT Head was completed, showing no acute process but ?dense MCA sign. tPA was given at 1544. CTA Head/Neck demonstrated proximal R ICA occlusion with distal reconstitution, 75% stenosis of the distal R common carotid (plaque vs. thrombus), proximal R M2 severe stenosis vs. subocclusive embolus with multiple missing R MCA branch vessels, occluded L common carotid with ICA and ECA reconstitution. Decision was made to transfer patient to [facility] for NIR intervention/thrombectomy. NIHSS 5 on presentation. Of note, patient was COVID+.  Patient underwent NIR intervention 8/22 with aspiration of the R ICA and recanalization (TICI 2C); irregular walls were noted on cervical angiogram c/f ?plaque rupture, but subsequent delay angio noted new clot formation requiring aspiration x 3 with recanalization, but continued new clot formation. Cangrelor load was given and stent x 1 was placed. Given COVID+ status, remained intubated post-procedure with transfer to 4N ICU.  Pertinent Medical History:  HTN, GERD, superficial  thrombophlebitis, polyneuropathy, anemia (2020), polycythemia (2022)  Significant Hospital Events: Including procedures, antibiotic start and stop dates in addition to other pertinent events   8/22 - Acute onset of L-sided weakness/facial droop at home, LKW 1430. Presented to [facility] via EMS. CT Head NAICA, CTA with multiple areas of occlusion/stenosis. Transferred to [facility] for NIR intervention. Intubated for procedure.  8/22 evening - extubated  Interim History / Subjective:  This morning awake, just had bath, on room air.   Objective:  Blood pressure (!) 128/58, pulse 66, temperature 98.2 F (36.8 C), temperature source Oral, resp. rate 18, height '5\' 5"'$  (1.651 m), last menstrual period 08/28/2017, SpO2 96 %.    Vent Mode: PRVC FiO2 (%):  [40 %-100 %] 40 % Set Rate:  [18 bmp] 18 bmp Vt Set:  [450 mL] 450 mL PEEP:  [5 cmH20] 5 cmH20 Plateau Pressure:  [16 cmH20] 16 cmH20   Intake/Output Summary (Last 24 hours) at 02/10/2021 1147 Last data filed at 02/10/2021 1000 Gross per 24 hour  Intake 2219.29 ml  Output 1250 ml  Net 969.29 ml   There were no vitals filed for this visit.  Physical Examination: General: awake, alert, no distress HEENT: mmm, mild right sided facial droop Neuro: normal speech, 4/4 strength in all 4 extremities CV:RRR PULM: diminished, clear, no wheezes or crackles GI: soft, nontender nondistended Extremities: No LE edema noted. Skin: Warm/dry, no rashes.  Resolved Hospital Problem List   Need for mechanical ventilation postprocedure  Assessment & Plan:  This 45 year old woman with stroke, COVID infection is seen in consultation at the request of Neuro/NIR for recommendations on further evaluation and management of post-procedure ventilation and COVID infection.  R MCA occlusion s/p thrombectomy - Management per Neuro/NIR - SBP goal  120-140 - Bed rest x 6 hours s/p femoral R CFA access - S/p cangrelor load - ASA '81mg'$  + Brilinta '90mg'$  BID starting  8/23  COVID-19 infection - incidental - stop steroids - continue remdesevir for 3 days  Polycythemia History of superficial thrombophlebitis - does not require AC Defer any decisions for long term AC to heme/onc with neurology input.   COPD Tobacco use disoder - nicotine patch - Discussed smoking cessation with patient - prn nebulizer treatments  PCCM will sign off, please call us back if we can be of further assistance.   Lenice Llamas, MD Pulmonary and [place]:  CBC: Recent Labs  Lab 02/09/21 1530 02/09/21 2137 02/10/21 0711  WBC 4.5  --  7.6  NEUTROABS 2.5  --   --   HGB 15.9* 15.0 13.7  HCT 46.2* 44.0 41.7  MCV 109.0*  --  112.1*  PLT 161  --  [id number]   Basic Metabolic Panel: Recent Labs  Lab 02/09/21 1530 02/09/21 2137 02/10/21 0711  NA 137 139 139  K 3.4* 3.8 3.8  CL 103  --  107  CO2 27  --  23  GLUCOSE 114*  --  139*  BUN 8  --  8  CREATININE 0.77  --  0.80  CALCIUM 8.3*  --  8.3*   GFR: Estimated Creatinine Clearance: 81.6 mL/min (by C-G formula based on SCr of 0.8 mg/dL). Recent Labs  Lab 02/09/21 1530 02/10/21 0711  WBC 4.5 7.6   Liver Function Tests: Recent Labs  Lab 02/09/21 1530 02/10/21 0711  AST 23 25  ALT 31 25  ALKPHOS 70 59  BILITOT 0.6 0.3  PROT 6.2* 5.4*  ALBUMIN 3.5 2.8*   No results for input(s): LIPASE, AMYLASE in the last 168 hours. No results for input(s): AMMONIA in the last 168 hours.  ABG:    Component Value Date/Time   PHART 7.369 02/09/2021 2137   PCO2ART 43.6 02/09/2021 2137   PO2ART 305 (H) 02/09/2021 2137   HCO3 25.2 02/09/2021 2137   TCO2 27 02/09/2021 2137   O2SAT 100.0 02/09/2021 2137    Coagulation Profile: Recent Labs  Lab 02/09/21 1530 02/10/21 0711  INR 1.0 1.1   Cardiac Enzymes: No results for input(s): CKTOTAL, CKMB, CKMBINDEX, TROPONINI in the last 168 hours.  HbA1C: Hgb A1c MFr Bld  Date/Time Value Ref Range Status  02/10/2021 07:11 AM  6.2 (H) 4.8 - 5.6 % Final    Comment:    (NOTE) Pre diabetes:          5.7%-6.4%  Diabetes:              >6.4%  Glycemic control for   <7.0% adults with diabetes     CBG: Recent Labs  Lab 02/10/21 0444  GLUCAP 157*

## 2021-02-10 NOTE — Progress Notes (Signed)
SLP Cancellation Note  Patient Details Name: YUKO FABELA MRN: [id number] DOB: 1975-06-30   Cancelled treatment:        Pt receiving bath with nursing. Will continue efforts for speech-language-cognitive evaluation. Likely tomorrow until return   Houston Siren 02/10/2021, 11:22 AM Orbie Pyo Colvin Caroli.Ed Risk analyst [phone] Office [phone]

## 2021-02-10 NOTE — Progress Notes (Addendum)
STROKE TEAM PROGRESS NOTE   INTERVAL HISTORY No family is at the bedside.  Joanna Garcia AAO x 3, moving all extremities, still has mild left TFN ataxia. Passed swallow, put on heart health diet. On Nicotine patch. Joanna Garcia stated that Joanna Garcia has two drinks every day. Smoking cessation and alcohol limitation education provided. On remidesivir for COVID infection, will do 3 day course. Pending MRI and MRA.    OBJECTIVE Vitals:   02/10/21 0430 02/10/21 0500 02/10/21 0530 02/10/21 0600  BP: (!) 102/57 (!) 112/59 93/61 (!) 97/56  Pulse: (!) 57 65 (!) 56 (!) 57  Resp: _0 Temp:      TempSrc:      SpO2: 95% 94% 95% 95%  Height:        CBC:  Recent Labs  Lab 02/09/21 1530 02/09/21 2137  WBC 4.5  --   NEUTROABS 2.5  --   HGB 15.9* 15.0  HCT 46.2* 44.0  MCV 109.0*  --   PLT 161  --     Basic Metabolic Panel:  Recent Labs  Lab 02/09/21 1530 02/09/21 2137  NA 137 139  K 3.4* 3.8  CL 103  --   CO2 27  --   GLUCOSE 114*  --   BUN 8  --   CREATININE 0.77  --   CALCIUM 8.3*  --     Lipid Panel: No results found for: CHOL, TRIG, HDL, CHOLHDL, VLDL, LDLCALC HgbA1c: No results found for: HGBA1C Urine Drug Screen:     Component Value Date/Time   LABOPIA NONE DETECTED 02/10/2021 0449   COCAINSCRNUR NONE DETECTED 02/10/2021 0449   LABBENZ NONE DETECTED 02/10/2021 0449   AMPHETMU NONE DETECTED 02/10/2021 0449   THCU NONE DETECTED 02/10/2021 0449   LABBARB NONE DETECTED 02/10/2021 0449    Alcohol Level     Component Value Date/Time   ETH <10 02/09/2021 1958    IMAGING   DG Abd 1 View  Result Date: 02/09/2021 CLINICAL DATA:  Enteric catheter placement EXAM: ABDOMEN - 1 VIEW COMPARISON:  None. FINDINGS: Frontal view of the lower chest and upper abdomen demonstrates an enteric catheter projecting over the gastric body. Excreted contrast is seen within the kidneys and ureters. Bowel gas pattern is unremarkable. IMPRESSION: 1. Enteric catheter overlying the gastric body. Electronically  Signed   By: Randa Ngo M.D.   On: 02/09/2021 19:24   CT HEAD CODE STROKE WO CONTRAST  Result Date: 02/09/2021 CLINICAL DATA:  Code stroke. Neuro deficit, acute, stroke suspected. Left-sided weakness and slurred speech. EXAM: CT HEAD WITHOUT CONTRAST TECHNIQUE: Contiguous axial images were obtained from the base of the skull through the vertex without intravenous contrast. 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 No definite acute infarct, intracranial hemorrhage, mass, midline shift, or extra-axial fluid collection is identified. The ventricles are normal in size. Vascular: Hyperdense right MCA. Skull: No fracture or suspicious osseous lesion. Sinuses/Orbits: Visualized paranasal sinuses and mastoid air cells are clear. Unremarkable orbits. Other: None. ASPECTS [facility] Stroke Program Early CT Score) - Ganglionic level infarction (caudate, lentiform nuclei, internal capsule, insula, M1-M3 cortex): 7 - Supraganglionic infarction (M4-M6 cortex): 3 Total score (0-10 with 10 being normal): 10 IMPRESSION: 1. 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 2. No definite acute infarct or intracranial hemorrhage. 3. Hyperdense right MCA.  CTA is pending. These results were communicated to Dr. Rory Percy at 3:42 pm on 02/09/2021 by text page via the [facility] messaging system. Electronically  Signed   By: Logan Bores M.D.   On: 02/09/2021 15:42   CT ANGIO HEAD NECK W WO CM (CODE STROKE)  Result Date: 02/09/2021 CLINICAL DATA:  Left-sided weakness and slurred speech. EXAM: CT ANGIOGRAPHY HEAD AND NECK TECHNIQUE: Multidetector CT imaging of the head and neck was performed using the standard protocol during bolus administration of intravenous contrast. Multiplanar CT image reconstructions and MIPs were obtained to evaluate the vascular anatomy. Carotid stenosis measurements (when applicable) are obtained utilizing NASCET criteria, using the distal  internal carotid diameter as the denominator. CONTRAST:  38m OMNIPAQUE IOHEXOL 350 MG/ML SOLN COMPARISON:  None. FINDINGS: CTA NECK FINDINGS Aortic arch: Normal variant aortic arch branching pattern with common origin of the brachiocephalic and left common carotid arteries. The brachiocephalic and subclavian arteries are patent although streak artifact limits assessment of the proximal right subclavian artery. Right carotid system: The common carotid artery is patent with calcified and soft plaque as well as possible intraluminal thrombus in the distal common carotid artery resulting in approximately 75% stenosis. The internal carotid artery is occluded just beyond its origin with faint reconstitution just below the skull base. Left carotid system: The common carotid artery is occluded. There is reconstitution of the internal and external carotid arteries at the carotid bifurcation. The cervical ICA is patent and uniform in caliber. Vertebral arteries: The vertebral arteries are patent with the left being strongly dominant. Partially calcified plaque at the left vertebral origin results in mild-to-moderate stenosis. Skeleton: Advanced facet arthrosis at C3-4.  Edentulous. Other neck: 1 cm calcified right thyroid nodule for which no imaging follow-up is recommended. Upper chest: Emphysema with biapical bullae. Review of the MIP images confirms the above findings CTA HEAD FINDINGS Anterior circulation: The intracranial right ICA is patent with a thready appearance of the petrous segment and more robust opacification of the cavernous segment through the terminus. There is mild narrowing of the right paraclinoid ICA. The intracranial left ICA is patent without a significant stenosis. The right M1 segment is widely patent, however there is a severe stenosis or subocclusive embolus involving the proximal right M2 inferior division with multiple missing smaller branch vessels. The left MCA and both ACAs are patent without  evidence of a significant proximal stenosis. No aneurysm is identified. Posterior circulation: The intracranial vertebral arteries are patent to the basilar with the right being particularly diminutive distal to the PICA origin. There is minimal nonstenotic plaque in the left V4 segment. The basilar artery is widely patent. There are patent posterior communicating arteries bilaterally. Both PCAs are patent without evidence of a significant proximal stenosis. No aneurysm is identified. Venous sinuses: As permitted by contrast timing, patent. Anatomic variants: Strongly dominant left vertebral artery. Review of the MIP images confirms the above findings IMPRESSION: 1. 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 2. 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 3. Proximal right M2 severe stenosis or subocclusive embolus with multiple missing right MCA branch vessels. 4. 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 5. Mild-to-moderate left vertebral artery origin stenosis. 6.  Emphysema (ICD10-J43.9). These results were called by telephone at the time of interpretation on 02/09/2021 at 3:49 pm to Dr. AAmie Portland who verbally acknowledged these results. Electronically Signed   By: ALogan BoresM.D.   On: 02/09/2021 16:07     Transthoracic Echocardiogram  00/00/2021 Pending   ECG - ST rate 118 BPM. (See cardiology reading for  complete details)   PHYSICAL EXAM  Temp:  [98.1 F (36.7 C)-99.1 F (37.3 C)] 98.2 F (36.8 C) (08/23 0800) Pulse Rate:  [55-119] 66 (08/23 0900) Resp:  [10-24] 18 (08/23 0900) BP: (53-203)/(35-113) 128/58 (08/23 0900) SpO2:  [91 %-100 %] 96 % (08/23 0900) FiO2 (%):  [40 %-100 %] 40 % (08/22 2142) Weight:  [66.2 kg] 66.2 kg (08/22 1554)  General - Well nourished, well developed, in no apparent distress.  Ophthalmologic - fundi not visualized due to  noncooperation.  Cardiovascular - Regular rhythm and rate.  Mental Status -  Level of arousal and orientation to time, place, and person were intact. Language including expression, naming, repetition, comprehension was assessed and found intact. Fund of Knowledge was assessed and was intact.  Cranial Nerves II - XII - II - Visual field intact OU. III, IV, VI - Extraocular movements intact. V - Facial sensation intact bilaterally. VII - Facial movement intact bilaterally. VIII - Hearing & vestibular intact bilaterally. X - Palate elevates symmetrically. Poor denture XI - Chin turning & shoulder shrug intact bilaterally. XII - Tongue protrusion intact.  Motor Strength - The patient's strength was normal in all extremities and pronator drift was absent except slight finger grip decreased on the left.  Bulk was normal and fasciculations were absent.   Motor Tone - Muscle tone was assessed at the neck and appendages and was normal.  Reflexes - The patient's reflexes were symmetrical in all extremities and Joanna Garcia had no pathological reflexes.  Sensory - Light touch, temperature/pinprick were assessed and were symmetrical.    Coordination - The patient had normal movements in the right hand with no ataxia or dysmetria.  Left FTN mild ataxia. Tremor was absent.  Gait and Station - deferred.     ASSESSMENT/PLAN Joanna Garcia is a 45 y.o. female with history of polycythemia, polyneuropathy, DVT hx, GERD, Htn, alcohol abuse, tobacco use, folate deficiency in 2020 along with a severe B12 deficiency, not on any anticoagulation who presented to [facility] with sudden onset of left-sided weakness with last known well 2:30 PM on 02/09/21. Joanna Garcia received [facility]PA [facility] at [facility] at 1544 on 02/09/21  Interventional Radiology - Cerebral Angiogram with Intervention - Right carotid angiogram showed filling defect with near occlusion of the right carotid bifurcation. Aspiration catheter advanced into the cervical  right ICA under aspiration.  However, delay angiograms showed new clot formation. Multiple (x3) cervical aspirations performed achieving recanalization followed by new clot formation. Patient was then loaded on cangrelor and a 6-8 x 40 mm XCAT carotid stent was deployed with cerebral protection device. Delay angiogram showed continued patency of the stent with no new clot formation. Left subclavian artery angiogram showed 45-50% stenosis at the origin of the left vertebral artery. 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 No definite acute infarct or intracranial hemorrhage. Hyperdense right MCA.   CTA H&N - [facility] - proximal right ICA occlusion with distal reconstitution, 75% stenosis of the distal right CCA with plaque versus thrombus, proximal right M2 severe stenosis or  subocclusive embolus and multiple missing right MCA branch vessels, chronically occluded left common carotid with ICA and ECA reconstitution. Mild-to-moderate left vertebral artery origin stenosis. IR -  interval recanalization of the right M2/MCA with slow distal flow (TICI 2C). Cervical ICA recanalization with irregular walls suggesting possible plaque rupture and s/p stenting.  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 MRA head - patent right ICA and M2 2D Echo - EF 60-65% LDL - 114 HgbA1c - 6.2 UDS - negative VTE prophylaxis - SCDs No antithrombotic prior to  admission, now on brilinta 90 mg bid + ASA 81 mg  Patient is counseled to be compliant with her antithrombotic medications Ongoing aggressive stroke risk factor management Therapy recommendations:  outpt OT Disposition:  Pending  Hx of Hypertension  Hypotension Home BP meds: Zestoretic  Current BP meds: phenylephrine BP running low SBP 120-140 mmHg 24 hours post IR Long-term BP goal 130-150 given chronic left CCA occlusion.  Hyperlipidemia Home Lipid lowering medication: none  LDL - 114, goal < 70 Current lipid lowering medication: Lipitor 40 Continue statin at discharge  COVID infection Lacey Jensen Virus 2 - positive  IV solumedrol - Remdesivir 3 day course Mild symptoms Airborne isolation  Polycythemia Hb 19.4-15.9-13.7 Following with oncology, ?  Primary versus secondary polycythemia due to smoking Bone marrow biopsy planned Status post phlebotomy x1  Carotid stenosis/occlusion CT head and neck left CCA chronic occlusion, left ICA retrograde flow from left ECA Right CCA thrombus versus soft plaque with right ICA occlusion status post IR with stenting MRA post IR showed patent right ICA  Tobacco abuse Current smoker Smoking cessation counseling provided Nicotine patch provided Joanna Garcia is willing to quit  Alcohol abuse Reported 2 drinks per day CIWA protocol FA/MVI/B1 Educated on alcohol limitation  Other Stroke Risk Factors Previous marijuana use ?  History of DVT  Other Active Problems, Findings, Recommendations and/or Plan Code status - Full code Emphysema (ICD10-J43.9).   Hospital day # 1  This patient is critically ill due to right ICA occlusion, right M2 thrombus status post IR, polycythemia,, COVID infection and at significant risk of neurological worsening, death form recurrent stroke, hemorrhagic conversion, respiratory failure, seizure. This patient's care requires constant monitoring of vital signs, hemodynamics, respiratory and cardiac monitoring,  review of multiple databases, neurological assessment, discussion with family, other specialists and medical decision making of high complexity. I spent 40 minutes of neurocritical care time in the care of this patient.  Rosalin Hawking, MD PhD Stroke Neurology 02/10/2021 11:56 PM   To contact Stroke Continuity provider, please refer to [email or web address] After hours, contact General Neurology

## 2021-02-10 NOTE — Progress Notes (Addendum)
Referring Physician(s): Code Stroke   Supervising Physician: Pedro Earls  Patient Status:  [facility] - In-pt  Chief Complaint:  Code Stroke Right ICA  and right MCA occlusion S/p revascularization of right M2/MCA achieving TICI 2C with right carotid artery stent placement due to persistent new clot formation noted during the cerebral and cervical arteriogram.  Subjective:  Notified by RN that there was a misunderstanding regarding patient's SBP parameter order, there was a period that SBP was kept below 120.  Patient remained asymptomatic per RN.  Patient laying in bed, not in acute distress. She reports no neurological symptoms except mild weakness on left arm, denies headache, double vision, nausea vomiting.  Dr. Karenann Cai discussed findings of CTA head and neck and cerebral and cervical arteriogram, informed the patient that a stent was placed to her right carotid artery due to irregularly of the vessel and persistent new clot formation during the neuro intervention. Patient states that she has been followed by hematology in [facility] and was being worked up for polycythemia vera.  Hypercoagulability labs ordered per neurology, results pending. She has history of smoking, asks if she can have a nicotine patch.  Nicotine patch ordered.   Allergies: Patient has no known allergies.  Medications: Prior to Admission medications   Medication Sig Start Date End Date Taking? Authorizing Provider  aspirin EC 81 MG tablet Take 243 mg by mouth daily. Swallow whole.   Yes [provider]  gabapentin (NEURONTIN) 300 MG capsule Take 300 mg by mouth 5 (five) times daily.   Yes [provider]  lisinopril-hydrochlorothiazide (ZESTORETIC) 10-12.5 MG tablet Take 0.25 tablets by mouth daily.   Yes [provider]  omeprazole (PRILOSEC) 40 MG capsule Take 1 capsule (40 mg total) by mouth daily. 03/16/19  Yes Gouru, Illene Silver, MD     Vital Signs: BP  (!) 128/58   Pulse 66   Temp 98.2 F (36.8 C) (Oral)   Resp 18   Ht '5\' 5"'$  (1.651 m) Comment: measured by RT  LMP 08/28/2017   SpO2 96%   BMI 24.30 kg/m   Physical Exam Vitals reviewed.  Constitutional:      General: She is not in acute distress.    Appearance: She is not ill-appearing.  HENT:     Head: Normocephalic and atraumatic.  Pulmonary:     Effort: Pulmonary effort is normal.  Abdominal:     General: Abdomen is flat.     Palpations: Abdomen is soft.  Skin:    General: Skin is warm and dry.     Comments: Positive dressing on right CFA puncture site. Site is unremarkable with no erythema, edema, tenderness, bleeding or drainage. Minimal amount of old, dry blood note son the dressing. Dressing otherwise clean, dry, and intact.    Neurological:     Mental Status: She is alert and oriented to person, place, and time.     Comments: Alert, awake, and oriented x 4.  Speech and comprehension intact.  Positive facial asymmetry, left facial drop.  Tongue midline  Mild weakness in left hand motor power intact otherwise. No sensory deficit on all 4 extremities.  Distal pulses 2+ bilaterally    Psychiatric:        Mood and Affect: Mood normal.        Behavior: Behavior normal.    Imaging: DG Abd 1 View  Result Date: 02/09/2021 CLINICAL DATA:  Enteric catheter placement EXAM: ABDOMEN - 1 VIEW COMPARISON:  None. FINDINGS:  Frontal view of the lower chest and upper abdomen demonstrates an enteric catheter projecting over the gastric body. Excreted contrast is seen within the kidneys and ureters. Bowel gas pattern is unremarkable. IMPRESSION: 1. Enteric catheter overlying the gastric body. Electronically Signed   By: Randa Ngo M.D.   On: 02/09/2021 19:24   CT HEAD CODE STROKE WO CONTRAST  Result Date: 02/09/2021 CLINICAL DATA:  Code stroke. Neuro deficit, acute, stroke suspected. Left-sided weakness and slurred speech. EXAM: CT HEAD WITHOUT CONTRAST TECHNIQUE: Contiguous  axial images were obtained from the base of the skull through the vertex without intravenous contrast. 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 No definite acute infarct, intracranial hemorrhage, mass, midline shift, or extra-axial fluid collection is identified. The ventricles are normal in size. Vascular: Hyperdense right MCA. Skull: No fracture or suspicious osseous lesion. Sinuses/Orbits: Visualized paranasal sinuses and mastoid air cells are clear. Unremarkable orbits. Other: None. ASPECTS [facility] Stroke Program Early CT Score) - Ganglionic level infarction (caudate, lentiform nuclei, internal capsule, insula, M1-M3 cortex): 7 - Supraganglionic infarction (M4-M6 cortex): 3 Total score (0-10 with 10 being normal): 10 IMPRESSION: 1. 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 2. No definite acute infarct or intracranial hemorrhage. 3. Hyperdense right MCA.  CTA is pending. These results were communicated to Dr. Rory Percy at 3:42 pm on 02/09/2021 by text page via the [facility] messaging system. Electronically Signed   By: Logan Bores M.D.   On: 02/09/2021 15:42   CT ANGIO HEAD NECK W WO CM (CODE STROKE)  Result Date: 02/09/2021 CLINICAL DATA:  Left-sided weakness and slurred speech. EXAM: CT ANGIOGRAPHY HEAD AND NECK TECHNIQUE: Multidetector CT imaging of the head and neck was performed using the standard protocol during bolus administration of intravenous contrast. Multiplanar CT image reconstructions and MIPs were obtained to evaluate the vascular anatomy. Carotid stenosis measurements (when applicable) are obtained utilizing NASCET criteria, using the distal internal carotid diameter as the denominator. CONTRAST:  86m OMNIPAQUE IOHEXOL 350 MG/ML SOLN COMPARISON:  None. FINDINGS: CTA NECK FINDINGS Aortic arch: Normal variant aortic arch branching pattern with common origin of the brachiocephalic and left common carotid arteries. The  brachiocephalic and subclavian arteries are patent although streak artifact limits assessment of the proximal right subclavian artery. Right carotid system: The common carotid artery is patent with calcified and soft plaque as well as possible intraluminal thrombus in the distal common carotid artery resulting in approximately 75% stenosis. The internal carotid artery is occluded just beyond its origin with faint reconstitution just below the skull base. Left carotid system: The common carotid artery is occluded. There is reconstitution of the internal and external carotid arteries at the carotid bifurcation. The cervical ICA is patent and uniform in caliber. Vertebral arteries: The vertebral arteries are patent with the left being strongly dominant. Partially calcified plaque at the left vertebral origin results in mild-to-moderate stenosis. Skeleton: Advanced facet arthrosis at C3-4.  Edentulous. Other neck: 1 cm calcified right thyroid nodule for which no imaging follow-up is recommended. Upper chest: Emphysema with biapical bullae. Review of the MIP images confirms the above findings CTA HEAD FINDINGS Anterior circulation: The intracranial right ICA is patent with a thready appearance of the petrous segment and more robust opacification of the cavernous segment through the terminus. There is mild narrowing of the right paraclinoid ICA. The intracranial left ICA is patent without a significant stenosis. The right M1 segment is widely patent, however there is a severe stenosis or  subocclusive embolus involving the proximal right M2 inferior division with multiple missing smaller branch vessels. The left MCA and both ACAs are patent without evidence of a significant proximal stenosis. No aneurysm is identified. Posterior circulation: The intracranial vertebral arteries are patent to the basilar with the right being particularly diminutive distal to the PICA origin. There is minimal nonstenotic plaque in the left V4  segment. The basilar artery is widely patent. There are patent posterior communicating arteries bilaterally. Both PCAs are patent without evidence of a significant proximal stenosis. No aneurysm is identified. Venous sinuses: As permitted by contrast timing, patent. Anatomic variants: Strongly dominant left vertebral artery. Review of the MIP images confirms the above findings IMPRESSION: 1. 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 2. 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 3. Proximal right M2 severe stenosis or subocclusive embolus with multiple missing right MCA branch vessels. 4. 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 5. Mild-to-moderate left vertebral artery origin stenosis. 6.  Emphysema (ICD10-J43.9). These results were called by telephone at the time of interpretation on 02/09/2021 at 3:49 pm to Dr. Amie Portland, who verbally acknowledged these results. Electronically Signed   By: Logan Bores M.D.   On: 02/09/2021 16:07    Labs:  CBC: Recent Labs    01/23/21 1150 01/28/21 1515 02/09/21 1530 02/09/21 2137 02/10/21 0711  WBC 14.3* 13.3* 4.5  --  7.6  HGB 19.2* 19.4* 15.9* 15.0 13.7  HCT 54.6* 56.2* 46.2* 44.0 41.7  PLT 208 205 161  --  171    COAGS: Recent Labs    02/09/21 1530 02/10/21 0711  INR 1.0 1.1  APTT 26  --     BMP: Recent Labs    01/09/21 1408 02/09/21 1530 02/09/21 2137 02/10/21 0711  NA 133* 137 139 139  K 3.4* 3.4* 3.8 3.8  CL 95* 103  --  107  CO2 26 27  --  23  GLUCOSE 108* 114*  --  139*  BUN 10 8  --  8  CALCIUM 9.5 8.3*  --  8.3*  CREATININE 0.95 0.77  --  0.80  GFRNONAA >60 >60  --  >60    LIVER FUNCTION TESTS: Recent Labs    01/09/21 1408 02/09/21 1530 02/10/21 0711  BILITOT 0.6 0.6 0.3  AST '26 23 25  '$ ALT '22 31 25  '$ ALKPHOS 79 70 59  PROT 7.9 6.2* 5.4*  ALBUMIN 4.3 3.5 2.8*    Assessment and Plan:  45 year old  female with PMH of polycythemia, polyneuropathy, DVT, history of smoking who was brought to [facility] ED due to left-sided weakness. Code stroke initiated, CTA head and neck showed right ICA and proximal right M2/MCA occlusion, 75% stenosis of distal right common carotid artery, and occluded left common carotid artery with ICA and ECA reconstitution.  Patient was also tested positive for COVID. S/p revascularization of right M2/MCA achieving TICI 2C with right carotid artery stent placement due to persistent new clot formation noted during the cerebral and cervical arteriogram.    Patient alert, awake, and oriented x 4.  Speech and comprehension intact.  Positive facial asymmetry, left facial drop.  Tongue midline  Mild weakness in left hand motor power intact otherwise. No sensory deficit on all 4 extremities.  Rt CFA puncture site unremarkable. Distal pulses 2+ bilaterally.    PLAN: Patient to be given ASA 81 mg qd and Brilinta 90 mg BID.  Further treatment plan per [facility]  Appreciate and agree with the plan.    Electronically Signed: Tera Mater, PA-C 02/10/2021, 11:28 AM   I spent a total of 25 Minutes at the the patient's bedside AND on the patient's hospital floor or unit, greater than 50% of which was counseling/coordinating care for revascularization of right ICA and M2/MCA occlusion with right carotid artery stent placement by Dr. Karenann Cai on 02/09/2021.

## 2021-02-10 NOTE — Progress Notes (Signed)
OT Cancellation Note  Patient Details Name: Joanna Garcia MRN: [id number] DOB: [birth year]   Cancelled Treatment:    Reason Eval/Treat Not Completed: Active bedrest order (s/p tPA & mechanical thrombectomy)  [place], OT/L   Acute OT Clinical Specialist Acute Rehabilitation Services Pager [phone] Office [phone]  02/10/2021, 7:48 AM

## 2021-02-10 NOTE — Progress Notes (Signed)
  Echocardiogram 2D Echocardiogram has been performed.  Joanna Garcia 02/10/2021, 12:53 PM

## 2021-02-10 NOTE — TOC CAGE-AID Note (Signed)
Transition of Care [facility]) - CAGE-AID Screening   Patient Details  Name: Joanna Garcia MRN: [id number] Date of Birth: 1976-03-23  [facility]) CM/SW Contact:    Careem Yasui C Tarpley-Carter, [place] Phone Number: 02/10/2021, 10:55 AM   Clinical Narrative: Pt is unable to participate in Cage Aid. Pt stated that she does not use substance or ETOH.  Arthor Gorter Tarpley-Carter, MSW, LCSW-A Pronouns:  She/Her/Hers [facility] Health[facility]itions of Care Clinical Social Worker Direct Number:  [phone] [email or web address]     CAGE-AID Screening: Substance Abuse Screening unable to be completed due to: : Patient unable to participate (Pt has stated she does not use substance or ETOH.)

## 2021-02-10 NOTE — Progress Notes (Signed)
PT Cancellation Note  Patient Details Name: NELDA SIEBOLD MRN: [id number] DOB: Jun 30, 1975   Cancelled Treatment:    Reason Eval/Treat Not Completed: Active bedrest order  Wyona Almas, PT, DPT Acute Rehabilitation Services Pager [phone] Office [phone]    Deno Etienne 02/10/2021, 7:42 AM

## 2021-02-11 ENCOUNTER — Other Ambulatory Visit (HOSPITAL_COMMUNITY): Payer: Self-pay

## 2021-02-11 ENCOUNTER — Encounter: Payer: Self-pay | Admitting: Oncology

## 2021-02-11 LAB — CBC
HCT: 37.8 % (ref 36.0–46.0)
Hemoglobin: 12.4 g/dL (ref 12.0–15.0)
MCH: 36.9 pg — ABNORMAL HIGH (ref 26.0–34.0)
MCHC: 32.8 g/dL (ref 30.0–36.0)
MCV: 112.5 fL — ABNORMAL HIGH (ref 80.0–100.0)
Platelets: 126 10*3/uL — ABNORMAL LOW (ref 150–400)
RBC: 3.36 MIL/uL — ABNORMAL LOW (ref 3.87–5.11)
RDW: 14.4 % (ref 11.5–15.5)
WBC: 5.4 10*3/uL (ref 4.0–10.5)
nRBC: 0 % (ref 0.0–0.2)

## 2021-02-11 LAB — GLUCOSE, CAPILLARY
Glucose-Capillary: 104 mg/dL — ABNORMAL HIGH (ref 70–99)
Glucose-Capillary: 98 mg/dL (ref 70–99)

## 2021-02-11 LAB — BASIC METABOLIC PANEL
Anion gap: 10 (ref 5–15)
BUN: 9 mg/dL (ref 6–20)
CO2: 25 mmol/L (ref 22–32)
Calcium: 8.5 mg/dL — ABNORMAL LOW (ref 8.9–10.3)
Chloride: 106 mmol/L (ref 98–111)
Creatinine, Ser: 0.76 mg/dL (ref 0.44–1.00)
GFR, Estimated: >60 mL/min (ref >60)
Glucose, Bld: 93 mg/dL (ref 70–99)
Potassium: 3.8 mmol/L (ref 3.5–5.1)
Sodium: 141 mmol/L (ref 135–145)

## 2021-02-11 LAB — HOMOCYSTEINE: Homocysteine: 26.5 umol/L — ABNORMAL HIGH (ref 0.0–14.5)

## 2021-02-11 MED ORDER — CERTAVITE/ANTIOXIDANTS PO TABS
1.0000 | ORAL_TABLET | Freq: Every day | ORAL | 2 refills | Status: DC
  Filled 2021-02-11: qty 30, 30d supply, fill #0

## 2021-02-11 MED ORDER — TICAGRELOR 90 MG PO TABS
90.0000 mg | ORAL_TABLET | Freq: Two times a day (BID) | ORAL | 2 refills | Status: DC
  Filled 2021-02-11: qty 60, 30d supply, fill #0

## 2021-02-11 MED ORDER — ASPIRIN 81 MG PO CHEW
81.0000 mg | CHEWABLE_TABLET | Freq: Every day | ORAL | 2 refills | Status: AC
  Filled 2021-02-11: qty 30, 30d supply, fill #0

## 2021-02-11 MED ORDER — ATORVASTATIN CALCIUM 40 MG PO TABS
40.0000 mg | ORAL_TABLET | Freq: Every day | ORAL | 2 refills | Status: DC
  Filled 2021-02-11: qty 30, 30d supply, fill #0

## 2021-02-11 MED ORDER — FOLIC ACID 1 MG PO TABS
1.0000 mg | ORAL_TABLET | Freq: Every day | ORAL | 2 refills | Status: DC
  Filled 2021-02-11: qty 30, 30d supply, fill #0

## 2021-02-11 MED ORDER — NICOTINE 14 MG/24HR TD PT24
14.0000 mg | MEDICATED_PATCH | Freq: Every day | TRANSDERMAL | 0 refills | Status: DC | PRN
  Filled 2021-02-11: qty 28, 28d supply, fill #0

## 2021-02-11 MED ORDER — GABAPENTIN 300 MG PO CAPS
300.0000 mg | ORAL_CAPSULE | Freq: Four times a day (QID) | ORAL | 2 refills | Status: DC
  Filled 2021-02-11: qty 120, 30d supply, fill #0

## 2021-02-11 MED ORDER — THIAMINE HCL 100 MG PO TABS
100.0000 mg | ORAL_TABLET | Freq: Every day | ORAL | 2 refills | Status: DC
  Filled 2021-02-11: qty 30, 30d supply, fill #0

## 2021-02-11 NOTE — Discharge Instructions (Addendum)
It is very important that you quit smoking Outpatient Occupational therapy will be scheduled Be sure to see your oncologist regarding your polycythemia  Because you have blocked arteries you must avoid low blood pressure.  Your systolic (top number) blood pressure goal is 130 to 150 mm Hg Do not take blood pressure medications unless instructed by your physicians, It is important to drink plenty of fluids but avoid alcohol. Avoid dehydration. Gradually increase your activity as tolerated. Avoid sweets since your sugar tends to run high

## 2021-02-11 NOTE — Progress Notes (Signed)
Physical Therapy Treatment and Discharge Patient Details Name: Joanna Garcia MRN: [id number] DOB: 01-Feb-1976 Today's Date: 02/11/2021    History of Present Illness 45 yo female admitted with R MCA and revascularization to [place] + PMH HTN GERD etoh/ tobacco use,    PT Comments    Pt met her physical therapy goals during her inpatient stay. Continues with decreased LUE coordination, but able to functionally use. Participated in dynamic standing balance activity of reaching and grabbing objects with left hand. Encouraged continued use for neuro recovery. Pt ambulating room distances with no assistive device modI. Pt reports slowed gait speed at baseline due to peripheral neuropathy; education provided regarding daily foot inspections due to decreased sensation. No further PT follow up; thank you for this consult.     Follow Up Recommendations  No PT follow up     Equipment Recommendations  None recommended by PT    Recommendations for Other Services       Precautions / Restrictions Precautions Precautions: None Restrictions Weight Bearing Restrictions: No    Mobility  Bed Mobility Overal bed mobility: Modified Independent                  Transfers Overall transfer level: Independent Equipment used: None                Ambulation/Gait Ambulation/Gait assistance: Modified independent (Device/Increase time) Gait Distance (Feet): 60 Feet Assistive device: None Gait Pattern/deviations: Step-through pattern;Decreased stride length Gait velocity: decreased   General Gait Details: slow and cautious pace, able to perform head and pivot turns without gross instability. pt reports baseline   Marine scientist Rankin (Stroke Patients Only) Modified Rankin (Stroke Patients Only) Pre-Morbid Rankin Score: No symptoms Modified Rankin: Moderately severe disability     Balance Overall balance assessment: Needs  assistance Sitting-balance support: Feet supported Sitting balance-Leahy Scale: Good     Standing balance support: No upper extremity supported;During functional activity Standing balance-Leahy Scale: Good           [place] - Eyes Opened: 10                  Cognition Arousal/Alertness: Awake/alert Behavior During Therapy: WFL for tasks assessed/performed Overall Cognitive Status: Within Functional Limits for tasks assessed                                        Exercises      General Comments        Pertinent Vitals/Pain Pain Assessment: No/denies pain    Home Living                      Prior Function            PT Goals (current goals can now be found in the care plan section) Acute Rehab PT Goals Patient Stated Goal: go home PT Goal Formulation: With patient Time For Goal Achievement: 02/24/21 Potential to Achieve Goals: Good Progress towards PT goals: Goals met/education completed, patient discharged from PT    Frequency    Min 4X/week      PT Plan Other (comment) (d/c therapies)    Co-evaluation              AM-PAC PT "6 Clicks" Mobility   Outcome Measure  Help needed  turning from your back to your side while in a flat bed without using bedrails?: None Help needed moving from lying on your back to sitting on the side of a flat bed without using bedrails?: None Help needed moving to and from a bed to a chair (including a wheelchair)?: None Help needed standing up from a chair using your arms (e.g., wheelchair or bedside chair)?: None Help needed to walk in hospital room?: None Help needed climbing 3-5 steps with a railing? : A Little 6 Click Score: 23    End of Session Equipment Utilized During Treatment: Gait belt Activity Tolerance: Patient tolerated treatment well Patient left: with call bell/phone within reach;in bed Nurse Communication: Mobility status PT Visit Diagnosis: Unsteadiness on feet  (R26.81)     Time: 0950-1006 PT Time Calculation (min) (ACUTE ONLY): 16 min  Charges:  $Therapeutic Activity: 8-22 mins                     Wyona Almas, PT, DPT Acute Rehabilitation Services Pager [phone] Office [phone]    Deno Etienne 02/11/2021, 11:34 AM

## 2021-02-11 NOTE — Plan of Care (Signed)
  Problem: Education: Goal: Knowledge of disease or condition will improve Outcome: Progressing Goal: Knowledge of secondary prevention will improve Outcome: Progressing Goal: Knowledge of patient specific risk factors addressed and post discharge goals established will improve Outcome: Progressing Goal: Individualized Educational Video(s) Outcome: Progressing   

## 2021-02-11 NOTE — Discharge Summary (Addendum)
Patient ID: Joanna Garcia   MRN: [id number]      DOB: [birth year]  Date of Admission: 02/09/2021 Date of Discharge: 02/11/2021  Attending Physician:  Stroke, Md, MD, Stroke MD Consultant(s):    Critical Care Medicine - Spero Geralds, MD Patient's PCP:  Letta Median, MD  DISCHARGE DIAGNOSIS:   Rt MCA and MCA/ACA infarct punctate scattered infarcts treated initially with tPA at [facility]. Neuro Interventional Radiology - Cerebral Angiogram with Intervention - with stenting of the right Cervical ICA Known occlusion of the Left ICA Polycythemia Polyneuropathy DVT hx GERD Htn Alcohol abuse Tobacco use Folate deficiency in 2020 along with a severe B12 deficiency Emphysema (ICD10-J43.9). Covid 19 Infection Ventilator-dependent respiratory insufficiency post-procedure in the setting of COVID infection     Principal Problem:   Acute ischemic right MCA stroke (HCC) Active Problems:   Erythrocytosis   Endotracheally intubated   Macrocytosis without anemia   COVID-19 virus infection   Past Medical History:  Diagnosis Date   DVT (deep venous thrombosis) (HCC)    GERD (gastroesophageal reflux disease)    Hypertension    Polyneuropathy    Past Surgical History:  Procedure Laterality Date   COLONOSCOPY N/A 03/16/2019   Procedure: COLONOSCOPY;  Surgeon: Toledo, Benay Pike, MD;  Location: [facility] ENDOSCOPY;  Service: Gastroenterology;  Laterality: N/A;   ESOPHAGOGASTRODUODENOSCOPY N/A 03/16/2019   Procedure: ESOPHAGOGASTRODUODENOSCOPY (EGD);  Surgeon: Toledo, Benay Pike, MD;  Location: [facility] ENDOSCOPY;  Service: Gastroenterology;  Laterality: N/A;   IR ANGIO INTRA EXTRACRAN SEL COM CAROTID INNOMINATE UNI L MOD SED  02/09/2021   IR INTRAVSC STENT CERV CAROTID W/EMB-PROT MOD SED INCL ANGIO  02/09/2021   IR PERCUTANEOUS ART THROMBECTOMY/INFUSION INTRACRANIAL INC DIAG ANGIO  02/09/2021   IR US GUIDE VASC ACCESS RIGHT  02/09/2021   NO PAST SURGERIES     RADIOLOGY WITH ANESTHESIA N/A  02/09/2021   Procedure: IR WITH ANESTHESIA;  Surgeon: Radiologist, Medication, MD;  Location: [place];  Service: Radiology;  Laterality: N/A;    Family History Family History  Problem Relation Age of Onset   Hypertension Mother    Hyperlipidemia Mother    Heart disease Mother    COPD Mother    Hypertension Father    Hyperlipidemia Father    Cancer Father        prostate   Leukemia Paternal Uncle    Cancer Paternal Grandmother    Breast cancer Paternal Grandmother     Social History  reports that she has been smoking cigarettes. She has been smoking an average of 1 pack per day. She has never used smokeless tobacco. She reports current alcohol use. She reports that she does not currently use drugs after having used the following drugs: Marijuana.  Allergies as of 02/11/2021   No Known Allergies      Medication List     STOP taking these medications    aspirin EC 81 MG tablet Replaced by: Aspirin Low Dose 81 MG chewable tablet   lisinopril-hydrochlorothiazide 10-12.5 MG tablet Commonly known as: ZESTORETIC       TAKE these medications    Aspirin Low Dose 81 MG chewable tablet Generic drug: aspirin Chew and swallow 1 tablet (81 mg total) by mouth daily. Start taking on: February 12, 2021 Replaces: aspirin EC 81 MG tablet   atorvastatin 40 MG tablet Commonly known as: LIPITOR Take 1 tablet (40 mg total) by mouth daily. Start taking on: February 12, 2021   Brilinta 90 MG Tabs tablet  Generic drug: ticagrelor Take 1 tablet (90 mg total) by mouth 2 (two) times daily.   CertaVite/Antioxidants Tabs Take 1 tablet by mouth daily. Start taking on: February 13, 92   folic acid 1 MG tablet Commonly known as: FOLVITE Take 1 tablet (1 mg total) by mouth daily. Start taking on: February 12, 2021   gabapentin 300 MG capsule Commonly known as: NEURONTIN Take 1 capsule (300 mg total) by mouth 4 (four) times daily. What changed: when to take this   nicotine 14 mg/24hr  patch Commonly known as: NICODERM CQ - dosed in mg/24 hours Place 1 patch (14 mg total) onto the skin daily as needed (hx of smoking).   omeprazole 40 MG capsule Commonly known as: PRILOSEC Take 1 capsule (40 mg total) by mouth daily.   thiamine 100 MG tablet Take 1 tablet (100 mg total) by mouth daily. Start taking on: February 12, 2021        HOME MEDICATIONS PRIOR TO ADMISSION Medications Prior to Admission  Medication Sig Dispense Refill   aspirin EC 81 MG tablet Take 243 mg by mouth daily. Swallow whole.     gabapentin (NEURONTIN) 300 MG capsule Take 300 mg by mouth 5 (five) times daily.     lisinopril-hydrochlorothiazide (ZESTORETIC) 10-12.5 MG tablet Take 0.25 tablets by mouth daily.     omeprazole (PRILOSEC) 40 MG capsule Take 1 capsule (40 mg total) by mouth daily. 90 capsule 0     HOSPITAL MEDICATIONS  aspirin  81 mg Oral Daily   atorvastatin  40 mg Oral Daily   budesonide (PULMICORT) nebulizer solution  0.5 mg Nebulization BID   Chlorhexidine Gluconate Cloth  6 each Topical Daily   folic acid  1 mg Oral Daily   gabapentin  300 mg Oral QID   insulin aspart  0-15 Units Subcutaneous TID AC & HS   mouth rinse  15 mL Mouth Rinse BID   multivitamin with minerals  1 tablet Oral Daily   thiamine  100 mg Oral Daily   ticagrelor  90 mg Oral BID    LABORATORY STUDIES CBC    Component Value Date/Time   WBC 5.4 02/11/2021 0436   RBC 3.36 (L) 02/11/2021 0436   HGB 12.4 02/11/2021 0436   HCT 37.8 02/11/2021 0436   PLT 126 (L) 02/11/2021 0436   MCV 112.5 (H) 02/11/2021 0436   MCH 36.9 (H) 02/11/2021 0436   MCHC 32.8 02/11/2021 0436   RDW 14.4 02/11/2021 0436   LYMPHSABS 1.5 02/09/2021 1530   MONOABS 0.5 02/09/2021 1530   EOSABS 0.1 02/09/2021 1530   BASOSABS 0.0 02/09/2021 1530   CMP    Component Value Date/Time   NA 141 02/11/2021 0436   K 3.8 02/11/2021 0436   CL 106 02/11/2021 0436   CO2 25 02/11/2021 0436   GLUCOSE 93 02/11/2021 0436   BUN 9 02/11/2021  0436   CREATININE 0.76 02/11/2021 0436   CALCIUM 8.5 (L) 02/11/2021 0436   PROT 5.4 (L) 02/10/2021 0711   ALBUMIN 2.8 (L) 02/10/2021 0711   AST 25 02/10/2021 0711   ALT 25 02/10/2021 0711   ALKPHOS 59 02/10/2021 0711   BILITOT 0.3 02/10/2021 0711   GFRNONAA >60 02/11/2021 0436   GFRAA >60 06/05/2019 1710   COAGS Lab Results  Component Value Date   INR 1.1 02/10/2021   INR 1.0 02/09/2021   Lipid Panel    Component Value Date/Time   CHOL 173 02/10/2021 0711   TRIG 129 02/10/2021 0711  TRIG 133 02/10/2021 0711   HDL 33 (L) 02/10/2021 0711   CHOLHDL 5.2 02/10/2021 0711   VLDL 26 02/10/2021 0711   LDLCALC 114 (H) 02/10/2021 0711   HgbA1C  Lab Results  Component Value Date   HGBA1C 6.2 (H) 02/10/2021   Urinalysis    Component Value Date/Time   COLORURINE STRAW (A) 02/10/2021 0449   APPEARANCEUR CLEAR 02/10/2021 0449   LABSPEC 1.033 (H) 02/10/2021 0449   PHURINE 6.0 02/10/2021 0449   GLUCOSEU NEGATIVE 02/10/2021 0449   HGBUR LARGE (A) 02/10/2021 0449   BILIRUBINUR NEGATIVE 02/10/2021 0449   KETONESUR NEGATIVE 02/10/2021 0449   PROTEINUR NEGATIVE 02/10/2021 0449   NITRITE NEGATIVE 02/10/2021 0449   LEUKOCYTESUR NEGATIVE 02/10/2021 0449   Urine Drug Screen     Component Value Date/Time   LABOPIA NONE DETECTED 02/10/2021 0449   COCAINSCRNUR NONE DETECTED 02/10/2021 0449   LABBENZ NONE DETECTED 02/10/2021 0449   AMPHETMU NONE DETECTED 02/10/2021 0449   THCU NONE DETECTED 02/10/2021 0449   LABBARB NONE DETECTED 02/10/2021 0449    Alcohol Level    Component Value Date/Time   ETH <10 02/09/2021 1958     SIGNIFICANT DIAGNOSTIC STUDIES  DG Abd 1 View Result Date: 02/09/2021 CLINICAL DATA:  Enteric catheter placement EXAM: ABDOMEN - 1 VIEW COMPARISON:  None. FINDINGS: Frontal view of the lower chest and upper abdomen demonstrates an enteric catheter projecting over the gastric body. Excreted contrast is seen within the kidneys and ureters. Bowel gas pattern is  unremarkable.  IMPRESSION:  1. Enteric catheter overlying the gastric body.  Electronically Signed   By: Randa Ngo M.D.   On: 02/09/2021 19:24   MR ANGIO HEAD WO CONTRAST Result Date: 02/10/2021 CLINICAL DATA:  Stroke, follow up EXAM: MRI HEAD WITHOUT CONTRAST MRA HEAD WITHOUT CONTRAST TECHNIQUE: Multiplanar, multi-echo pulse sequences of the brain and surrounding structures were acquired without intravenous contrast. Angiographic images of the Circle of Willis were acquired using MRA technique without intravenous contrast. COMPARISON:  02/09/2021. FINDINGS: MRI HEAD FINDINGS Brain: Scattered small acute cortical and subcortical infarcts in the right frontal and parietal lobes, watershed and right MCA territories. Mild associated edema without mass effect. No midline shift. Additional mild scattered T2 hyperintensities white matter, likely related chronic microvascular ischemic disease. No acute hemorrhage, hydrocephalus, mass lesion, or extra-axial collection. Mild atrophy. Vascular: See below. Skull and upper cervical spine: Normal marrow signal. Sinuses/Orbits: Largely clear sinuses.  No acute orbital findings. Other: No mastoid effusions. MRA HEAD FINDINGS Anterior circulation: Bilateral intracranial ICAs are patent. Mild narrowing of the right paraclinoid ICA. Bilateral MCAs and ACAs are patent without proximal hemodynamically significant stenosis. The right M2 MCA branch canal appears patent without hemodynamically significant stenosis. No aneurysm identified. Posterior circulation: Similar diminutive right vertebral artery distal to PICA. Widely patent dominant left vertebral artery. Patent basilar artery without significant stenosis. Posterior cerebral arteries are patent bilaterally without hemodynamically significant proximal stenosis no aneurysm identified. The graft  IMPRESSION:  MRI:  Scattered small acute cortical and subcortical infarcts in the right frontal and parietal lobes, watershed  and right MCA territories. Mild associated edema without mass effect.  MRA:  1. No large vessel occlusion or proximal hemodynamically significant stenosis.  2. Proximal right M2 MCA branches and right ICA demonstrate good flow related signal without hemodynamically significant proximal stenosis status post revascularization. Similar mild narrowing of the right paraclinoid ICA.  Electronically Signed   By: Margaretha Sheffield M.D.   On: 02/10/2021 15:24   MR BRAIN WO CONTRAST  Result Date: 02/10/2021 CLINICAL DATA:  Stroke, follow up EXAM: MRI HEAD WITHOUT CONTRAST MRA HEAD WITHOUT CONTRAST TECHNIQUE: Multiplanar, multi-echo pulse sequences of the brain and surrounding structures were acquired without intravenous contrast. Angiographic images of the Circle of Willis were acquired using MRA technique without intravenous contrast. COMPARISON:  02/09/2021. FINDINGS: MRI HEAD FINDINGS Brain: Scattered small acute cortical and subcortical infarcts in the right frontal and parietal lobes, watershed and right MCA territories. Mild associated edema without mass effect. No midline shift. Additional mild scattered T2 hyperintensities white matter, likely related chronic microvascular ischemic disease. No acute hemorrhage, hydrocephalus, mass lesion, or extra-axial collection. Mild atrophy. Vascular: See below. Skull and upper cervical spine: Normal marrow signal. Sinuses/Orbits: Largely clear sinuses.  No acute orbital findings. Other: No mastoid effusions. MRA HEAD FINDINGS Anterior circulation: Bilateral intracranial ICAs are patent. Mild narrowing of the right paraclinoid ICA. Bilateral MCAs and ACAs are patent without proximal hemodynamically significant stenosis. The right M2 MCA branch canal appears patent without hemodynamically significant stenosis. No aneurysm identified. Posterior circulation: Similar diminutive right vertebral artery distal to PICA. Widely patent dominant left vertebral artery. Patent basilar  artery without significant stenosis. Posterior cerebral arteries are patent bilaterally without hemodynamically significant proximal stenosis no aneurysm identified. The graft  IMPRESSION:  MRI:  Scattered small acute cortical and subcortical infarcts in the right frontal and parietal lobes, watershed and right MCA territories. Mild associated edema without mass effect.  MRA:  1. No large vessel occlusion or proximal hemodynamically significant stenosis.  2. Proximal right M2 MCA branches and right ICA demonstrate good flow related signal without hemodynamically significant proximal stenosis status post revascularization. Similar mild narrowing of the right paraclinoid ICA.  Electronically Signed   By: Margaretha Sheffield M.D.   On: 02/10/2021 15:24   ECHOCARDIOGRAM COMPLETE 02/10/2021    ECHOCARDIOGRAM REPORT   Patient Name:   Joanna Garcia Sylla Date of Exam: 02/10/2021 Medical Rec #:  [id number]       Height:       65.0 in Accession #:    [id number]      Weight:       146.0 lb Date of Birth:  [birth year]       BSA:          1.730 m Patient Age:    45 years        BP:           128/70 mmHg Patient Gender: F               HR:           71 bpm. Exam Location:  Inpatient Procedure: 2D Echo, Color Doppler and Cardiac Doppler Indications:    Stroke  History:        Patient has prior history of Echocardiogram examinations, most                 recent 02/12/2019. Carotid Disease, Signs/Symptoms:Murmur; Risk                 Factors:Hypertension. Covid 19 positive.  Sonographer:    Merrie Roof RDCS Referring Phys: [id number] Spelter  1. Left ventricular ejection fraction, by estimation, is 60 to 65%. The left ventricle has normal function. The left ventricle has no regional wall motion abnormalities. Left ventricular diastolic parameters were normal.  2. Right ventricular systolic function is normal. The right ventricular size is normal. Tricuspid regurgitation signal is inadequate for assessing PA  pressure.  3. Mild subvalvular calcification.  The mitral valve is grossly normal. Trivial mitral valve regurgitation. No evidence of mitral stenosis.  4. The aortic valve is normal in structure. Aortic valve regurgitation is not visualized. No aortic stenosis is present.  5. The inferior vena cava is normal in size with <50% respiratory variability, suggesting right atrial pressure of 8 mmHg.  Conclusion(s)/Recommendation(s):  No intracardiac source of embolism detected on this transthoracic study. A transesophageal echocardiogram is recommended to exclude cardiac source of embolism if clinically indicated.  FINDINGS  Left Ventricle: Left ventricular ejection fraction, by estimation, is 60 to 65%. The left ventricle has normal function. The left ventricle has no regional wall motion abnormalities. The left ventricular internal cavity size was normal in size. There is  no left ventricular hypertrophy. Left ventricular diastolic parameters were normal. Right Ventricle: The right ventricular size is normal. No increase in right ventricular wall thickness. Right ventricular systolic function is normal. Tricuspid regurgitation signal is inadequate for assessing PA pressure. Left Atrium: Left atrial size was normal in size. Right Atrium: Right atrial size was normal in size. Pericardium: Trivial pericardial effusion is present. Presence of pericardial fat pad. Mitral Valve: Mild subvalvular calcification. The mitral valve is grossly normal. Trivial mitral valve regurgitation. No evidence of mitral valve stenosis. Tricuspid Valve: The tricuspid valve is normal in structure. Tricuspid valve regurgitation is not demonstrated. No evidence of tricuspid stenosis. Aortic Valve: The aortic valve is normal in structure. Aortic valve regurgitation is not visualized. No aortic stenosis is present. Pulmonic Valve: The pulmonic valve was normal in structure. Pulmonic valve regurgitation is trivial. No evidence of pulmonic stenosis.  Aorta: The aortic root is normal in size and structure. Venous: The inferior vena cava is normal in size with less than 50% respiratory variability, suggesting right atrial pressure of 8 mmHg. IAS/Shunts: No atrial level shunt detected by color flow Doppler.  LEFT VENTRICLE PLAX 2D LVIDd:         4.80 cm Diastology LVIDs:         3.20 cm LV e' medial:    11.00 cm/s LV PW:         0.90 cm LV E/e' medial:  8.3 LV IVS:        0.90 cm LV e' lateral:   10.70 cm/s                        LV E/e' lateral: 8.5  LEFT ATRIUM             Index       RIGHT ATRIUM           Index LA diam:        3.40 cm 1.96 cm/m  RA Area:     13.40 cm LA Vol (A2C):   40.7 ml 23.52 ml/m RA Volume:   27.60 ml  15.95 ml/m LA Vol (A4C):   24.6 ml 14.22 ml/m LA Biplane Vol: 33.9 ml 19.59 ml/m   AORTA Ao Root diam: 3.10 cm MITRAL VALVE MV Area (PHT): 4.49 cm MV Decel Time: 169 msec MV E velocity: 91.00 cm/s MV A velocity: 73.20 cm/s MV E/A ratio:  1.24 Cherlynn Kaiser MD Electronically signed by Cherlynn Kaiser MD Signature Date/Time: 02/10/2021/4:39:22 PM    Final    CT HEAD CODE STROKE WO CONTRAST Result Date: 02/09/2021 CLINICAL DATA:  Code stroke. Neuro deficit, acute, stroke suspected. Left-sided weakness and slurred speech. EXAM: CT HEAD WITHOUT CONTRAST TECHNIQUE: Contiguous axial images were obtained from the base  of the skull through the vertex without intravenous contrast. 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 No definite acute infarct, intracranial hemorrhage, mass, midline shift, or extra-axial fluid collection is identified. The ventricles are normal in size. Vascular: Hyperdense right MCA. Skull: No fracture or suspicious osseous lesion. Sinuses/Orbits: Visualized paranasal sinuses and mastoid air cells are clear. Unremarkable orbits. Other: None. ASPECTS [facility] Stroke Program Early CT Score) - Ganglionic level infarction (caudate, lentiform nuclei, internal  capsule, insula, M1-M3 cortex): 7 - Supraganglionic infarction (M4-M6 cortex): 3 Total score (0-10 with 10 being normal): 10 IMPRESSION: 1. 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 2. No definite acute infarct or intracranial hemorrhage. 3. Hyperdense right MCA.  CTA is pending. These results were communicated to Dr. Rory Percy at 3:42 pm on 02/09/2021 by text page via the [facility] messaging system. Electronically Signed   By: Logan Bores M.D.   On: 02/09/2021 15:42   CT ANGIO HEAD NECK W WO CM (CODE STROKE) Result Date: 02/09/2021 CLINICAL DATA:  Left-sided weakness and slurred speech. EXAM: CT ANGIOGRAPHY HEAD AND NECK TECHNIQUE: Multidetector CT imaging of the head and neck was performed using the standard protocol during bolus administration of intravenous contrast. Multiplanar CT image reconstructions and MIPs were obtained to evaluate the vascular anatomy. Carotid stenosis measurements (when applicable) are obtained utilizing NASCET criteria, using the distal internal carotid diameter as the denominator. CONTRAST:  60m OMNIPAQUE IOHEXOL 350 MG/ML SOLN COMPARISON:  None. FINDINGS: CTA NECK FINDINGS Aortic arch: Normal variant aortic arch branching pattern with common origin of the brachiocephalic and left common carotid arteries. The brachiocephalic and subclavian arteries are patent although streak artifact limits assessment of the proximal right subclavian artery. Right carotid system: The common carotid artery is patent with calcified and soft plaque as well as possible intraluminal thrombus in the distal common carotid artery resulting in approximately 75% stenosis. The internal carotid artery is occluded just beyond its origin with faint reconstitution just below the skull base. Left carotid system: The common carotid artery is occluded. There is reconstitution of the internal and external carotid arteries at the carotid bifurcation. The cervical ICA is patent and uniform in caliber.  Vertebral arteries: The vertebral arteries are patent with the left being strongly dominant. Partially calcified plaque at the left vertebral origin results in mild-to-moderate stenosis. Skeleton: Advanced facet arthrosis at C3-4.  Edentulous. Other neck: 1 cm calcified right thyroid nodule for which no imaging follow-up is recommended. Upper chest: Emphysema with biapical bullae. Review of the MIP images confirms the above findings CTA HEAD FINDINGS Anterior circulation: The intracranial right ICA is patent with a thready appearance of the petrous segment and more robust opacification of the cavernous segment through the terminus. There is mild narrowing of the right paraclinoid ICA. The intracranial left ICA is patent without a significant stenosis. The right M1 segment is widely patent, however there is a severe stenosis or subocclusive embolus involving the proximal right M2 inferior division with multiple missing smaller branch vessels. The left MCA and both ACAs are patent without evidence of a significant proximal stenosis. No aneurysm is identified. Posterior circulation: The intracranial vertebral arteries are patent to the basilar with the right being particularly diminutive distal to the PICA origin. There is minimal nonstenotic plaque in the left V4 segment. The basilar artery is widely patent. There are patent posterior communicating arteries bilaterally. Both PCAs are patent without evidence of a significant proximal stenosis. No aneurysm is identified. Venous sinuses:  As permitted by contrast timing, patent. Anatomic variants: Strongly dominant left vertebral artery. Review of the MIP images confirms the above findings  IMPRESSION:  1. 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  2. 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  3. Proximal right M2 severe stenosis or subocclusive embolus with multiple missing right MCA  branch vessels.  4. 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  5. Mild-to-moderate left vertebral artery origin stenosis.  6.  Emphysema (ICD10-J43.9).  These results were called by telephone at the time of interpretation on 02/09/2021 at 3:49 pm to Dr. Amie Portland, who verbally acknowledged these results. Electronically Signed   By: Logan Bores M.D.   On: 02/09/2021 16:07       HISTORY OF PRESENT ILLNESS Joanna Garcia is a 45 y.o. right-handed woman    She was initially seen by Dr. Amie Portland at [facility] hospital, who noted the following:   "Joanna Garcia is a 45 y.o. female PMH of polycythemia, polyneuropathy, alcohol abuse, not on any anticoagulation, sudden onset of left-sided weakness with last known well 2:30 PM today. Patient reports that she was at home, unloading the dishwasher when she had sudden onset of weakness on the left side.  Her arm and leg gave out.  She also noticed that her left face was droopy.  Her arm and face also felt numb.  She also had slurred speech when she attempted to talk. EMS was contacted, they evaluated the patient and activated a code stroke and brought her for evaluation to [facility] regional hospital where she was seen at the bridge and evaluated and taken for CT head. CT head unremarkable for acute process. Possible dense MCA sign Risks and benefits of IV tPA discussed with the patient and IV tPA was given after CT did not show any evidence of bleed-reviewed by me. Following tPA administration, CT angiography of the head and neck was done which showed proximal right ICA occlusion with distal reconstitution, 75% stenosis of the distal right common carotid with plaque versus thrombus, proximal right M2 severe stenosis or subocclusive embolus and multiple missing right MCA branch vessels, occluded left common carotid with ICA and ECA reconstitution. Case discussed with neuro interventionalist Dr. Ladean Raya and EVT offered  to the patient who agreed. Prolonged delay in transfer-due to in availability of critical transport as well as inability of EMS to transport without an Therapist, sports and no RN being available."   Per EMS [facility] team, her NIH had improved in transport to a modified NIH of 1.  However by the time of my evaluation it was worsened again as documented below.  She was taken emergently to the IR suite for intervention   LKW: 2:30 PM tPA given?:  Yes, at [facility] at 1544 IA performed?:  Yes Premorbid modified rankin scale:      0 - No symptoms.   Regarding her polycythemia, she is followed by oncology, last seen on 01/28/2021 at which time she was planned for a bone marrow biopsy for further evaluation.  JAK2 mutation testing was inconclusive and the second test is still pending.  She is also noted to have a history of folate deficiency in 2020 along with a severe B12 deficiency.  Some concern for secondary polycythemia in the setting of smoking; she was also being treated with phlebotomy to reduce her hematocrit   HOSPITAL COURSE [name] is  a 45 y.o. female with history of polycythemia, polyneuropathy, DVT hx, GERD, Htn, alcohol abuse, tobacco use, folate deficiency in 2020 along with a severe B12 deficiency, not on any anticoagulation who presented to [facility] with sudden onset of left-sided weakness with last known well 2:30 PM on 02/09/21. 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 No definite acute infarct or intracranial hemorrhage. Hyperdense right MCA.   CTA H&N - [facility] - proximal right ICA occlusion with distal reconstitution, 75% stenosis of the distal right CCA with plaque versus thrombus, proximal right M2 severe stenosis or subocclusive embolus and  multiple missing right MCA branch vessels, chronically occluded left common carotid with ICA and ECA reconstitution. Mild-to-moderate left vertebral artery origin stenosis. IR -  interval recanalization of the right M2/MCA with slow distal flow (TICI 2C). Cervical ICA recanalization with irregular walls suggesting possible plaque rupture and s/p stenting.  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 MRA head - patent right ICA and M2 2D Echo - EF 60-65% LDL - 114 HgbA1c - 6.2 UDS - negative VTE prophylaxis - SCDs No antithrombotic prior to admission, now on brilinta 90 mg bid + ASA 81 mg duration per IR Patient is counseled to be compliant with her antithrombotic medications Ongoing aggressive stroke risk factor management Therapy recommendations:  Outpt OT Disposition:  home today   Hx of Hypertension  Hypotension Home BP meds: Zestoretic  Off phenylephrine BP running on the low end Long-term BP goal 130-150 given chronic left CCA occlusion.   Hyperlipidemia Home Lipid lowering medication: none  LDL - 114, goal < 70 Current lipid lowering medication: Lipitor 40 Continue statin at discharge   COVID infection Lacey Jensen Virus 2 - positive  IV solumedrol - Remdesivir 3 day course Minimal symptoms Airborne isolation   Polycythemia Hb 19.4-15.9-13.7 Following with oncology, ?  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  History of left arm SVT vs. DVT - not treated wit AC in the past per pt   Other Active Problems, Findings, Recommendations and/or Plan Code status - Full code Emphysema (ICD10-J43.9).  DISCHARGE INSTRUCTIONS GIVEN TO PATIENT It is very important that you quit smoking Outpatient Occupational therapy will be scheduled Be sure to see your oncologist regarding your polycythemia  Because you have blocked arteries you must avoid low blood pressure.  Your systolic (top number) blood pressure goal is 130 to 150 mm Hg Do not take blood pressure medications unless instructed by your physicians, It is important to drink plenty of fluids but avoid alcohol. Avoid dehydration. Gradually increase your activity as tolerated. Avoid  sweets since your sugar tends to run high  DISCHARGE EXAM Vitals:   02/11/21 1200 02/11/21 1300 02/11/21 1400 02/11/21 1500  BP:      Pulse:      Resp: _0 Temp:      TempSrc:      SpO2:      Height:       General - Well nourished, well developed, in no apparent distress.   Ophthalmologic - fundi not visualized due to noncooperation.   Cardiovascular - Regular rhythm and rate.   Mental Status -  Level of arousal and orientation to time, place, and person were intact. Language including expression, naming, repetition, comprehension was assessed and found intact. Fund of Knowledge was assessed and was intact.   Cranial Nerves II - XII - II - Visual field intact OU. III, IV, VI - Extraocular movements intact. V - Facial sensation intact bilaterally. VII - Facial movement intact bilaterally. VIII - Hearing & vestibular intact bilaterally. X - Palate elevates symmetrically. Poor denture XI - Chin turning & shoulder shrug intact bilaterally. XII - Tongue protrusion intact.   Motor Strength  - The patient's strength was normal in all extremities and pronator drift was absent except slight finger grip decreased on the left.  Bulk was normal and fasciculations were absent.   Motor Tone - Muscle tone was assessed at the neck and appendages and was normal.   Reflexes - The patient's reflexes were symmetrical in all extremities and she had no pathological reflexes.   Sensory - Light touch, temperature/pinprick were assessed and were symmetrical.     Coordination - The patient had normal movements in the right hand with no ataxia or dysmetria.  Left FTN mild ataxia. Tremor was absent.   Gait and Station - deferred.   DISCHARGE DIET   Diet Order             Diet Heart Room service appropriate? Yes; Fluid consistency: Thin  Diet effective now                  liquids  DISCHARGE PLAN Disposition:  Discharged to home aspirin 81 mg daily and Brilinta (ticagrelor) 90 mg bid for secondary stroke prevention. Ongoing risk factor control by Primary Care Physician at time of discharge Follow-up Bender, Durene Cal, MD in 2 weeks. Follow-up in [facility] in 4 weeks, office to schedule an appointment.  Follow up with oncology for bone marrow biopsy and polycythemia  Brilinta coupon and discount managed by [facility] pharmacy.    50  minutes were spent preparing discharge.  Rosalin Hawking, MD PhD Stroke Neurology 02/11/2021 3:50 PM

## 2021-02-11 NOTE — TOC Transition Note (Signed)
Transition of Care [facility]) - CM/SW Discharge Note   Patient Details  Name: Joanna Garcia MRN: [id number] Date of Birth: 02-02-1976  Transition [facility]) CM/SW Contact:  Ella Bodo, RN Phone Number: 02/11/2021, 12:10pm  Clinical Narrative:  45 yo female admitted with R MCA and revascularization to TICI 2C;  COVID positive.  PTA, pt independent, lives at home with mother. PT recommending no OP follow up; OT recommending OP follow up.  Pt medically stable for discharge home with mother.  She is discharging on [place] therapy. Pt is uninsured, but is eligible for medication assistance through [facility] program. DC Rx sent to [facility] to be filled using [facility] letter.  Patient made aware of high cost of Brilinta; she has been provided with patient assistance form, and plans to follow-up with her primary care doctor on August 29 at 9:40 AM.  She understands that PCP can assist with completion of patient assistance form and receiving medications, if eligible for assistance.  Referral made to [facility] for outpatient occupational therapy, as recommended.  Rehab center to call patient for appointment.   Final next level of care: OP Rehab Barriers to Discharge: Barriers Resolved   Patient Goals and CMS Choice Patient states their goals for this hospitalization and ongoing recovery are:: to go home                          Discharge Plan and Services   Discharge Planning Services: CM Consult, [facility], Medication Assistance, Follow-up appt scheduled                                 Social Determinants of Health (SDOH) Interventions     Readmission Risk Interventions Readmission Risk Prevention Plan 02/11/2021  Post Dischage Appt Complete  Medication Screening Complete  Transportation Screening Complete  Some recent data might be hidden   Reinaldo Raddle, RN, BSN  Trauma/Neuro ICU Case Manager [phone]

## 2021-02-11 NOTE — Progress Notes (Signed)
  Speech Language Pathology Treatment:    Patient Details Name: Joanna Garcia MRN: [id number] DOB: 04-17-1976 Today's Date: 02/11/2021 Time:  -     Pt discharging today. Screened through [employer] and PT/OT. No needs identified. No formal ST eval needed.  Dewitt Rota, SLP-Student   [place] Latonga Ponder 02/11/2021, 1:06 PM

## 2021-02-11 NOTE — Progress Notes (Signed)
Reviewed discharge paperwork the patient and answered all questions to her satisfaction. Provided prescription medications from [facility].

## 2021-02-12 ENCOUNTER — Telehealth: Payer: Self-pay | Admitting: *Deleted

## 2021-02-12 LAB — CARDIOLIPIN ANTIBODIES, IGG, IGM, IGA
Anticardiolipin IgA: <9 APL U/mL (ref 0–11)
Anticardiolipin IgG: <9 GPL U/mL (ref 0–14)
Anticardiolipin IgM: 18 MPL U/mL — ABNORMAL HIGH (ref 0–12)

## 2021-02-12 LAB — BETA-2-GLYCOPROTEIN I ABS, IGG/M/A
Beta-2 Glyco I IgG: <9 GPI IgG units (ref 0–20)
Beta-2-Glycoprotein I IgA: <9 GPI IgA units (ref 0–25)
Beta-2-Glycoprotein I IgM: <9 GPI IgM units (ref 0–32)

## 2021-02-12 NOTE — Telephone Encounter (Signed)
Dr Janese Banks has asked me to get in touch with neuruologist Dr Erlinda Hong  to see if it is ok to set pt up for bone marrow bx. I sent him secure chat and she did pretty well from the stroke standpoint however she got ICA stent so she is on aspirin and Brilinta.  He is not sure if that is a concern for a bone marrow biopsy.  From neuro wise he feels fine with the biopsy.  I sent him a message back saying that when we do the bone marrow biopsies for any patient we do not take them off any blood thinners at all.  I let Dr. Janese Banks know that it was okay to set it up.  Called down to IR to get an appointment made.  They will call me back and let me know the date.  Talk to Dr. Janese Banks later in the day and she wants to think about it over the weekend and then she will tell me for sure about a date.

## 2021-02-16 LAB — FACTOR 5 LEIDEN

## 2021-02-16 LAB — PROTHROMBIN GENE MUTATION

## 2021-02-17 ENCOUNTER — Telehealth: Payer: Self-pay | Admitting: Oncology

## 2021-02-17 ENCOUNTER — Other Ambulatory Visit: Payer: Self-pay

## 2021-02-17 ENCOUNTER — Other Ambulatory Visit (HOSPITAL_COMMUNITY): Payer: Self-pay | Admitting: Neuroradiology

## 2021-02-17 ENCOUNTER — Telehealth: Payer: Self-pay

## 2021-02-17 DIAGNOSIS — D751 Secondary polycythemia: Secondary | ICD-10-CM

## 2021-02-17 DIAGNOSIS — D72829 Elevated white blood cell count, unspecified: Secondary | ICD-10-CM

## 2021-02-17 DIAGNOSIS — I639 Cerebral infarction, unspecified: Secondary | ICD-10-CM

## 2021-02-17 NOTE — Telephone Encounter (Signed)
Returned patient call, no answer. Left voicemail explaining that Dr Janese Banks is only recommending Bone Marrow Biopsy at this time and not the port placement. Per Dr Janese Banks would like to investigate the cause of abnormal labwork first. Apt details left on message with date and time of biopsy.

## 2021-02-17 NOTE — Telephone Encounter (Signed)
Pt called and stated she was told to ask Dr. Janese Banks if she wanted the patient to get a port at the same time as her biopsy. Would like a call back to discuss those options.

## 2021-02-18 ENCOUNTER — Other Ambulatory Visit (HOSPITAL_COMMUNITY): Payer: Self-pay

## 2021-02-18 ENCOUNTER — Inpatient Hospital Stay: Payer: Self-pay

## 2021-02-18 ENCOUNTER — Other Ambulatory Visit: Payer: Self-pay

## 2021-02-18 DIAGNOSIS — D751 Secondary polycythemia: Secondary | ICD-10-CM

## 2021-02-18 DIAGNOSIS — D72829 Elevated white blood cell count, unspecified: Secondary | ICD-10-CM

## 2021-02-18 LAB — CBC
HCT: 40.3 % (ref 36.0–46.0)
Hemoglobin: 13.2 g/dL (ref 12.0–15.0)
MCH: 34.9 pg — ABNORMAL HIGH (ref 26.0–34.0)
MCHC: 32.8 g/dL (ref 30.0–36.0)
MCV: 106.6 fL — ABNORMAL HIGH (ref 80.0–100.0)
Platelets: 256 10*3/uL (ref 150–400)
RBC: 3.78 MIL/uL — ABNORMAL LOW (ref 3.87–5.11)
RDW: 14.4 % (ref 11.5–15.5)
WBC: 4.7 10*3/uL (ref 4.0–10.5)
nRBC: 0 % (ref 0.0–0.2)

## 2021-02-18 LAB — IRON AND TIBC
Iron: 30 ug/dL (ref 28–170)
Saturation Ratios: 6 % — ABNORMAL LOW (ref 10.4–31.8)
TIBC: 529 ug/dL — ABNORMAL HIGH (ref 250–450)
UIBC: 499 ug/dL

## 2021-02-18 LAB — VITAMIN B12: Vitamin B-12: 719 pg/mL (ref 180–914)

## 2021-02-18 LAB — FOLATE: Folate: 3 ng/mL — ABNORMAL LOW (ref >5.9)

## 2021-02-18 LAB — TSH: TSH: 1.518 u[IU]/mL (ref 0.350–4.500)

## 2021-02-18 LAB — FERRITIN: Ferritin: 8 ng/mL — ABNORMAL LOW (ref 11–307)

## 2021-02-18 NOTE — Telephone Encounter (Signed)
The pt. Was contacted today by Dr. Janese Banks and the [facility] bx was cancelled and pt coming to [place] to get labs 8/31

## 2021-02-19 ENCOUNTER — Other Ambulatory Visit: Payer: Self-pay

## 2021-02-19 ENCOUNTER — Telehealth: Payer: Self-pay | Admitting: Oncology

## 2021-02-19 ENCOUNTER — Inpatient Hospital Stay: Payer: Self-pay | Attending: Oncology | Admitting: Oncology

## 2021-02-19 DIAGNOSIS — D751 Secondary polycythemia: Secondary | ICD-10-CM

## 2021-02-19 DIAGNOSIS — E611 Iron deficiency: Secondary | ICD-10-CM

## 2021-02-19 DIAGNOSIS — E538 Deficiency of other specified B group vitamins: Secondary | ICD-10-CM

## 2021-02-19 DIAGNOSIS — D7589 Other specified diseases of blood and blood-forming organs: Secondary | ICD-10-CM

## 2021-02-19 MED ORDER — FOLIC ACID 1 MG PO TABS: 2.0000 mg | ORAL_TABLET | Freq: Every day | ORAL | 0 refills | Status: DC

## 2021-02-19 NOTE — Progress Notes (Signed)
I connected with Joanna Garcia on 02/19/21 at 10:00 AM EDT by video enabled telemedicine visit and verified that I am speaking with the correct person using two identifiers.   I discussed the limitations, risks, security and privacy concerns of performing an evaluation and management service by telemedicine and the availability of in-person appointments. I also discussed with the patient that there may be a patient responsible charge related to this service. The patient expressed understanding and agreed to proceed.  Other persons participating in the visit and their role in the encounter:  none  Patient's location:  home Provider's location:  home  Chief Complaint:  discuss results of bloodwork  History of present illness: patient is a 45 year old female who was evaluated by NP Rulon Abide for polycythemia.Patient has had a macrocytic anemia upon 09/07/2020 and her MCV has been between 10 2-1 27.  Most recently on 01/09/2021 she was noted to have a white count of 16.2, H&H of 19.5/55.7 with an MCV of 106 and a platelet count of 217.  JAK2 mutation testing x1 was inconclusive and the second JAK2 mutation testing was negative.  Patient has had some history of folate deficiency back in 2020 along with severe B12 deficiency.Her hemoglobin improved significantly since then but macrocytosis has persisted.   She is an everyday smoker but does not report any trouble sleeping.  She has some ongoing fatigue.  No history of chronic lung disease.Urinalysis did not show any evidence of hematuria.  T4 level was low normal at 4.9  Plan was to proceed with bone marrow biopsy to a certain if polycythemia is primary or secondary.  2 sessions of phlebotomy were attempted for polycythemia but 500 cc could not be taken out as blood appeared to be "too thick" per infusion.  Before patient could get a bone marrow biopsy she was admitted to [facility] with symptoms of acute stroke and was discharged on Brilinta and  aspirin  Interval history patient has made a complete neurological recovery since her stroke.  She drinks a couple of beers daily and also continues to smoke.   Review of Systems  Constitutional:  Positive for malaise/fatigue. Negative for chills, fever and weight loss.  HENT:  Negative for congestion, ear discharge and nosebleeds.   Eyes:  Negative for blurred vision.  Respiratory:  Negative for cough, hemoptysis, sputum production, shortness of breath and wheezing.   Cardiovascular:  Negative for chest pain, palpitations, orthopnea and claudication.  Gastrointestinal:  Negative for abdominal pain, blood in stool, constipation, diarrhea, heartburn, melena, nausea and vomiting.  Genitourinary:  Negative for dysuria, flank pain, frequency, hematuria and urgency.  Musculoskeletal:  Negative for back pain, joint pain and myalgias.  Skin:  Negative for rash.  Neurological:  Negative for dizziness, tingling, focal weakness, seizures, weakness and headaches.  Endo/Heme/Allergies:  Does not bruise/bleed easily.  Psychiatric/Behavioral:  Negative for depression and suicidal ideas. The patient does not have insomnia.    No Known Allergies  Past Medical History:  Diagnosis Date   DVT (deep venous thrombosis) (HCC)    GERD (gastroesophageal reflux disease)    Hypertension    Polyneuropathy     Past Surgical History:  Procedure Laterality Date   COLONOSCOPY N/A 03/16/2019   Procedure: COLONOSCOPY;  Surgeon: Toledo, Benay Pike, MD;  Location: [facility] ENDOSCOPY;  Service: Gastroenterology;  Laterality: N/A;   ESOPHAGOGASTRODUODENOSCOPY N/A 03/16/2019   Procedure: ESOPHAGOGASTRODUODENOSCOPY (EGD);  Surgeon: Toledo, Benay Pike, MD;  Location: [facility] ENDOSCOPY;  Service: Gastroenterology;  Laterality: N/A;  IR ANGIO INTRA EXTRACRAN SEL COM CAROTID INNOMINATE UNI L MOD SED  02/09/2021   IR INTRAVSC STENT CERV CAROTID W/EMB-PROT MOD SED INCL ANGIO  02/09/2021   IR PERCUTANEOUS ART THROMBECTOMY/INFUSION  INTRACRANIAL INC DIAG ANGIO  02/09/2021   IR US GUIDE VASC ACCESS RIGHT  02/09/2021   NO PAST SURGERIES     RADIOLOGY WITH ANESTHESIA N/A 02/09/2021   Procedure: IR WITH ANESTHESIA;  Surgeon: Radiologist, Medication, MD;  Location: [place];  Service: Radiology;  Laterality: N/A;    Social History   Socioeconomic History   Marital status: Divorced    Spouse name: Not on file   Number of children: Not on file   Years of education: Not on file   Highest education level: Not on file  Occupational History   Not on file  Tobacco Use   Smoking status: Every Day    Packs/day: 1.00    Types: Cigarettes   Smokeless tobacco: Never  Vaping Use   Vaping Use: Never used  Substance and Sexual Activity   Alcohol use: Yes    Comment: 21 Oz daily   Drug use: Not Currently    Types: Marijuana   Sexual activity: Not on file  Other Topics Concern   Not on file  Social History Narrative   Lives at home with mother.   Social Determinants of Health   Financial Resource Strain: Not on file  Food Insecurity: Not on file  Transportation Needs: Not on file  Physical Activity: Not on file  Stress: Not on file  Social Connections: Not on file  Intimate Partner Violence: Not on file    Family History  Problem Relation Age of Onset   Hypertension Mother    Hyperlipidemia Mother    Heart disease Mother    COPD Mother    Hypertension Father    Hyperlipidemia Father    Cancer Father        prostate   Leukemia Paternal Uncle    Cancer Paternal Grandmother    Breast cancer Paternal Grandmother      Current Outpatient Medications:    aspirin 81 MG chewable tablet, Chew and swallow 1 tablet (81 mg total) by mouth daily., Disp: 30 tablet, Rfl: 2   atorvastatin (LIPITOR) 40 MG tablet, Take 1 tablet (40 mg total) by mouth daily., Disp: 30 tablet, Rfl: 2   folic acid (FOLVITE) 1 MG tablet, Take 2 tablets (2 mg total) by mouth daily., Disp: 30 tablet, Rfl: 0   gabapentin (NEURONTIN) 300 MG capsule,  Take 1 capsule (300 mg total) by mouth 4 (four) times daily., Disp: 120 capsule, Rfl: 2   Multiple Vitamins-Minerals (CERTAVITE/ANTIOXIDANTS) TABS, Take 1 tablet by mouth daily., Disp: 30 tablet, Rfl: 2   nicotine (NICODERM CQ - DOSED IN MG/24 HOURS) 14 mg/24hr patch, Place 1 patch (14 mg total) onto the skin daily as needed (hx of smoking)., Disp: 28 patch, Rfl: 0   omeprazole (PRILOSEC) 40 MG capsule, Take 1 capsule (40 mg total) by mouth daily., Disp: 90 capsule, Rfl: 0   thiamine 100 MG tablet, Take 1 tablet (100 mg total) by mouth daily., Disp: 30 tablet, Rfl: 2   ticagrelor (BRILINTA) 90 MG TABS tablet, Take 1 tablet (90 mg total) by mouth 2 (two) times daily., Disp: 60 tablet, Rfl: 2  DG Abd 1 View  Result Date: 02/09/2021 CLINICAL DATA:  Enteric catheter placement EXAM: ABDOMEN - 1 VIEW COMPARISON:  None. FINDINGS: Frontal view of the lower chest and upper abdomen demonstrates  an enteric catheter projecting over the gastric body. Excreted contrast is seen within the kidneys and ureters. Bowel gas pattern is unremarkable. IMPRESSION: 1. Enteric catheter overlying the gastric body. Electronically Signed   By: Randa Ngo M.D.   On: 02/09/2021 19:24   MR ANGIO HEAD WO CONTRAST  Result Date: 02/10/2021 CLINICAL DATA:  Stroke, follow up EXAM: MRI HEAD WITHOUT CONTRAST MRA HEAD WITHOUT CONTRAST TECHNIQUE: Multiplanar, multi-echo pulse sequences of the brain and surrounding structures were acquired without intravenous contrast. Angiographic images of the Circle of Willis were acquired using MRA technique without intravenous contrast. COMPARISON:  02/09/2021. FINDINGS: MRI HEAD FINDINGS Brain: Scattered small acute cortical and subcortical infarcts in the right frontal and parietal lobes, watershed and right MCA territories. Mild associated edema without mass effect. No midline shift. Additional mild scattered T2 hyperintensities white matter, likely related chronic microvascular ischemic disease. No  acute hemorrhage, hydrocephalus, mass lesion, or extra-axial collection. Mild atrophy. Vascular: See below. Skull and upper cervical spine: Normal marrow signal. Sinuses/Orbits: Largely clear sinuses.  No acute orbital findings. Other: No mastoid effusions. MRA HEAD FINDINGS Anterior circulation: Bilateral intracranial ICAs are patent. Mild narrowing of the right paraclinoid ICA. Bilateral MCAs and ACAs are patent without proximal hemodynamically significant stenosis. The right M2 MCA branch canal appears patent without hemodynamically significant stenosis. No aneurysm identified. Posterior circulation: Similar diminutive right vertebral artery distal to PICA. Widely patent dominant left vertebral artery. Patent basilar artery without significant stenosis. Posterior cerebral arteries are patent bilaterally without hemodynamically significant proximal stenosis no aneurysm identified. The graft IMPRESSION: MRI: Scattered small acute cortical and subcortical infarcts in the right frontal and parietal lobes, watershed and right MCA territories. Mild associated edema without mass effect. MRA: 1. No large vessel occlusion or proximal hemodynamically significant stenosis. 2. Proximal right M2 MCA branches and right ICA demonstrate good flow related signal without hemodynamically significant proximal stenosis status post revascularization. Similar mild narrowing of the right paraclinoid ICA. Electronically Signed   By: Margaretha Sheffield M.D.   On: 02/10/2021 15:24   MR BRAIN WO CONTRAST  Result Date: 02/10/2021 CLINICAL DATA:  Stroke, follow up EXAM: MRI HEAD WITHOUT CONTRAST MRA HEAD WITHOUT CONTRAST TECHNIQUE: Multiplanar, multi-echo pulse sequences of the brain and surrounding structures were acquired without intravenous contrast. Angiographic images of the Circle of Willis were acquired using MRA technique without intravenous contrast. COMPARISON:  02/09/2021. FINDINGS: MRI HEAD FINDINGS Brain: Scattered small acute  cortical and subcortical infarcts in the right frontal and parietal lobes, watershed and right MCA territories. Mild associated edema without mass effect. No midline shift. Additional mild scattered T2 hyperintensities white matter, likely related chronic microvascular ischemic disease. No acute hemorrhage, hydrocephalus, mass lesion, or extra-axial collection. Mild atrophy. Vascular: See below. Skull and upper cervical spine: Normal marrow signal. Sinuses/Orbits: Largely clear sinuses.  No acute orbital findings. Other: No mastoid effusions. MRA HEAD FINDINGS Anterior circulation: Bilateral intracranial ICAs are patent. Mild narrowing of the right paraclinoid ICA. Bilateral MCAs and ACAs are patent without proximal hemodynamically significant stenosis. The right M2 MCA branch canal appears patent without hemodynamically significant stenosis. No aneurysm identified. Posterior circulation: Similar diminutive right vertebral artery distal to PICA. Widely patent dominant left vertebral artery. Patent basilar artery without significant stenosis. Posterior cerebral arteries are patent bilaterally without hemodynamically significant proximal stenosis no aneurysm identified. The graft IMPRESSION: MRI: Scattered small acute cortical and subcortical infarcts in the right frontal and parietal lobes, watershed and right MCA territories. Mild associated edema without mass effect. MRA: 1.  No large vessel occlusion or proximal hemodynamically significant stenosis. 2. Proximal right M2 MCA branches and right ICA demonstrate good flow related signal without hemodynamically significant proximal stenosis status post revascularization. Similar mild narrowing of the right paraclinoid ICA. Electronically Signed   By: Margaretha Sheffield M.D.   On: 02/10/2021 15:24   IR INTRAVSC STENT CERV CAROTID W/EMB-PROT MOD SED  Result Date: 02/11/2021 INDICATION: 45 year old female with past medical history significant for polycythemia,  polyneuropathy, alcohol and tobacco use. She presented to [facility] with sudden onset of left-sided weakness. Her last known well was 2:30 p.m. on 02/09/2021. NIHSS 12 at presentation; baseline modified Rankin scale 0. Head CT showed no acute infarct or hemorrhage. CT angiogram of the head and neck showed filling defect within the distal right common carotid artery with occlusion of the proximal cervical right internal carotid artery as well as right M2/MCA occlusion with multiple distal emboli. She was given IV tPA and transferred to our service for a diagnostic cerebral angiogram and mechanical thrombectomy. EXAM: ULTRASOUND-GUIDED VASCULAR ACCESS DIAGNOSTIC CEREBRAL ANGIOGRAM MECHANICAL THROMBECTOMY RIGHT CAROTID STENTING WITH CEREBRAL PROTECTION DEVICE COMPARISON:  CT/CT angiogram of the head and neck February 09, 2021 MEDICATIONS: Cangrelor IV bolus and drip ANESTHESIA/SEDATION: The procedure was performed under general anesthesia. CONTRAST:  100 mL of Omnipaque 240 milligram/mL FLUOROSCOPY TIME:  Fluoroscopy Time: 21 minutes 30 seconds (921.1 mGy). COMPLICATIONS: None immediate. TECHNIQUE: Informed written consent was obtained from the patient's mother after a thorough discussion of the procedural risks, benefits and alternatives. All questions were addressed. Maximal Sterile Barrier Technique was utilized including caps, mask, sterile gowns, sterile gloves, sterile drape, hand hygiene and skin antiseptic. A timeout was performed prior to the initiation of the procedure. The right groin was prepped and draped in the usual sterile fashion. Using a micropuncture kit and the modified Seldinger technique, access was gained to the right common femoral artery and an 8 French sheath was placed. Real-time ultrasound guidance was utilized for vascular access including the acquisition of a permanent ultrasound image documenting patency of the accessed vessel. Under fluoroscopy, an 8 [place] Walrus balloon guide catheter was  navigated over a 6 [place] Berenstein 2 catheter and a 0.035" Terumo Glidewire into the aortic arch. The catheter was placed into the right common carotid artery. Frontal and lateral angiograms of the neck were obtained. FINDINGS: 1. Common femoral artery has adequate caliber for vascular access. 2. Large burden of clot within the distal right common carotid artery extending into the right carotid bulb with severe stenosis but with distal perfusion. PROCEDURE: The 6 [place] Berenstein 2 catheter was removed and the walrus guide catheter was connected to an aspiration pump and advanced into the distal right common carotid artery and origin of the right external carotid artery. The guide catheter was retracted into the right common carotid artery. Next, a zoom 71 aspiration catheter was coaxial navigated through the walrus balloon guide catheter into the distal right common carotid artery. The aspiration catheter was connected to an aspiration pump in continuous aspiration was performed in the distal right common carotid artery and right carotid bulb. After clear aspirate was obtained, the aspiration catheter was advanced into the cavernous segment of the right ICA. Frontal and lateral angiograms of the head were obtained. Multiple distal (cortical) intracranial occlusions were noted in the left MCA territory without proximal large or medium vessel occlusion. Delayed angiograms showed improvement of flow with persistent distal occlusion in the left parietal region. The catheter was then retracted into the right  carotid bulb, frontal and lateral angiograms of the neck were obtained showing resolution of filling defect in the right carotid bulb. The catheter was then retracted into the right common carotid artery. Frontal and lateral angiograms of the neck were obtained and showed irregular distal right common carotid artery walls, suggestive of possible plaque rupture, with normal flow. Delay right common carotid artery  angiogram showed new filling defect within the distal right artery. The zoom 71 aspiration catheter was again connected to an aspiration pump and continuous aspiration was performed. Right common carotid artery angiogram with frontal and lateral views of the head showed no evidence of thromboembolic complication. Vasospasm was noted in the cervical right ICA. The catheter was then retracted into the proximal right common carotid artery. Frontal and lateral angiograms of the neck were obtained showing resolution of the filling defect with persistent wall irregularity of the right common carotid artery and vasospasm of the cervical right ICA. Intra arterial infusion 5 mg of verapamil was performed into the right common carotid artery. Delayed frontal and lateral angiograms of the neck showed improvement of the vasospasm with new filling defect within the distal right common carotid artery. The zoom 71 aspiration catheter was again connected to an aspiration pump and continuous aspiration was performed. Frontal and lateral angiograms of the neck were obtained showing resolution of the filling defect with persistent wall irregularity of the right common carotid artery and resolution of vasospasm of the cervical right ICA. Delayed angiogram with frontal and lateral views of the neck showed new clot formation within the right carotid bulb and distal right common carotid artery. At this point, patient was loaded with cangrelor followed by continuous drip. Under biplane roadmap, a 2.5-4.8 Emboshield NAV6 cerebral protection device was navigated to the distal cervical segment of the right ICA. Subsequently, a 6-8 x 40 mm exact carotid stent was deployed spanning the distal right common carotid artery and right carotid bulb, across the area of wall irregularity. Follow-up right common carotid artery angiogram with frontal and lateral views of the head showed improvement of intracranial cortical occlusions now with complete  recanalization (TICI2C) with slow flow in a distal parietal cortical branch. The catheter was then placed into the left subclavian artery. Frontal angiogram of the neck was obtained. Atherosclerotic changes in the left subclavian artery are noted with approximally 50% stenosis at the origin of the left vertebral artery. The catheter was again placed into the right common carotid artery. Delay angiograms with magnified frontal lateral views of the neck centered on the recently deployed carotid stent showed excellent anterograde flow without evidence of new in stent clot formation. The catheter was subsequently withdrawn. A right common femoral artery angiogram was obtained with right anterior oblique view. The puncture is at the level of the mid right common femoral artery. Small caliber of the right common femoral artery is noted, likely related to vasospasm. Intra arterial infusion of 5 mg of verapamil was performed. The 8 French femoral sheath was exchanged over the wire for a 7 French femoral sheath. Then, a 7 [place] Exoseal closure device was utilized for access closure followed by manual pressure for approximately 5 minutes. Hemostasis was then achieved. IMPRESSION: 1. Successful mechanical thrombectomy of the distal right common carotid artery and carotid bulb with direct contact aspiration followed by new clot formation likely related to a combination of plaque rupture and hypercoagulable state. Multiple (x4) direct contact aspiration passes performed with subsequent new clot formation. A carotid stent was then placed spanning  the distal right common carotid artery and carotid bulb with utilization of a cerebral protection device resulting in complete reperfusion and no further clot formation. 2. No intracranial thrombectomy performed and improvement of the intracranial flow was seen after cervical revascularization with a final TICI 2C status. 3. Approximately 50% stenosis at the origin of the left vertebral  artery. PLAN: 1. Patient will be transitioned from intravenous cangrelor to dual antiplatelet therapy with aspirin and ticagrelor including a loading dose (180 mg) of ticagrelor. 2. Patient remained intubated after the intervention due to positive COVID 19 test. Electronically Signed   By: Pedro Earls M.D.   On: 02/11/2021 11:30   IR US Guide Vasc Access Right  Result Date: 02/11/2021 INDICATION: 45 year old female with past medical history significant for polycythemia, polyneuropathy, alcohol and tobacco use. She presented to [facility] with sudden onset of left-sided weakness. Her last known well was 2:30 p.m. on 02/09/2021. NIHSS 12 at presentation; baseline modified Rankin scale 0. Head CT showed no acute infarct or hemorrhage. CT angiogram of the head and neck showed filling defect within the distal right common carotid artery with occlusion of the proximal cervical right internal carotid artery as well as right M2/MCA occlusion with multiple distal emboli. She was given IV tPA and transferred to our service for a diagnostic cerebral angiogram and mechanical thrombectomy. EXAM: ULTRASOUND-GUIDED VASCULAR ACCESS DIAGNOSTIC CEREBRAL ANGIOGRAM MECHANICAL THROMBECTOMY RIGHT CAROTID STENTING WITH CEREBRAL PROTECTION DEVICE COMPARISON:  CT/CT angiogram of the head and neck February 09, 2021 MEDICATIONS: Cangrelor IV bolus and drip ANESTHESIA/SEDATION: The procedure was performed under general anesthesia. CONTRAST:  100 mL of Omnipaque 240 milligram/mL FLUOROSCOPY TIME:  Fluoroscopy Time: 21 minutes 30 seconds (921.1 mGy). COMPLICATIONS: None immediate. TECHNIQUE: Informed written consent was obtained from the patient's mother after a thorough discussion of the procedural risks, benefits and alternatives. All questions were addressed. Maximal Sterile Barrier Technique was utilized including caps, mask, sterile gowns, sterile gloves, sterile drape, hand hygiene and skin antiseptic. A timeout was performed  prior to the initiation of the procedure. The right groin was prepped and draped in the usual sterile fashion. Using a micropuncture kit and the modified Seldinger technique, access was gained to the right common femoral artery and an 8 French sheath was placed. Real-time ultrasound guidance was utilized for vascular access including the acquisition of a permanent ultrasound image documenting patency of the accessed vessel. Under fluoroscopy, an 8 [place] Walrus balloon guide catheter was navigated over a 6 [place] Berenstein 2 catheter and a 0.035" Terumo Glidewire into the aortic arch. The catheter was placed into the right common carotid artery. Frontal and lateral angiograms of the neck were obtained. FINDINGS: 1. Common femoral artery has adequate caliber for vascular access. 2. Large burden of clot within the distal right common carotid artery extending into the right carotid bulb with severe stenosis but with distal perfusion. PROCEDURE: The 6 [place] Berenstein 2 catheter was removed and the walrus guide catheter was connected to an aspiration pump and advanced into the distal right common carotid artery and origin of the right external carotid artery. The guide catheter was retracted into the right common carotid artery. Next, a zoom 71 aspiration catheter was coaxial navigated through the walrus balloon guide catheter into the distal right common carotid artery. The aspiration catheter was connected to an aspiration pump in continuous aspiration was performed in the distal right common carotid artery and right carotid bulb. After clear aspirate was obtained, the aspiration catheter was advanced into  the cavernous segment of the right ICA. Frontal and lateral angiograms of the head were obtained. Multiple distal (cortical) intracranial occlusions were noted in the left MCA territory without proximal large or medium vessel occlusion. Delayed angiograms showed improvement of flow with persistent distal occlusion  in the left parietal region. The catheter was then retracted into the right carotid bulb, frontal and lateral angiograms of the neck were obtained showing resolution of filling defect in the right carotid bulb. The catheter was then retracted into the right common carotid artery. Frontal and lateral angiograms of the neck were obtained and showed irregular distal right common carotid artery walls, suggestive of possible plaque rupture, with normal flow. Delay right common carotid artery angiogram showed new filling defect within the distal right artery. The zoom 71 aspiration catheter was again connected to an aspiration pump and continuous aspiration was performed. Right common carotid artery angiogram with frontal and lateral views of the head showed no evidence of thromboembolic complication. Vasospasm was noted in the cervical right ICA. The catheter was then retracted into the proximal right common carotid artery. Frontal and lateral angiograms of the neck were obtained showing resolution of the filling defect with persistent wall irregularity of the right common carotid artery and vasospasm of the cervical right ICA. Intra arterial infusion 5 mg of verapamil was performed into the right common carotid artery. Delayed frontal and lateral angiograms of the neck showed improvement of the vasospasm with new filling defect within the distal right common carotid artery. The zoom 71 aspiration catheter was again connected to an aspiration pump and continuous aspiration was performed. Frontal and lateral angiograms of the neck were obtained showing resolution of the filling defect with persistent wall irregularity of the right common carotid artery and resolution of vasospasm of the cervical right ICA. Delayed angiogram with frontal and lateral views of the neck showed new clot formation within the right carotid bulb and distal right common carotid artery. At this point, patient was loaded with cangrelor followed by  continuous drip. Under biplane roadmap, a 2.5-4.8 Emboshield NAV6 cerebral protection device was navigated to the distal cervical segment of the right ICA. Subsequently, a 6-8 x 40 mm exact carotid stent was deployed spanning the distal right common carotid artery and right carotid bulb, across the area of wall irregularity. Follow-up right common carotid artery angiogram with frontal and lateral views of the head showed improvement of intracranial cortical occlusions now with complete recanalization (TICI2C) with slow flow in a distal parietal cortical branch. The catheter was then placed into the left subclavian artery. Frontal angiogram of the neck was obtained. Atherosclerotic changes in the left subclavian artery are noted with approximally 50% stenosis at the origin of the left vertebral artery. The catheter was again placed into the right common carotid artery. Delay angiograms with magnified frontal lateral views of the neck centered on the recently deployed carotid stent showed excellent anterograde flow without evidence of new in stent clot formation. The catheter was subsequently withdrawn. A right common femoral artery angiogram was obtained with right anterior oblique view. The puncture is at the level of the mid right common femoral artery. Small caliber of the right common femoral artery is noted, likely related to vasospasm. Intra arterial infusion of 5 mg of verapamil was performed. The 8 French femoral sheath was exchanged over the wire for a 7 French femoral sheath. Then, a 7 [place] Exoseal closure device was utilized for access closure followed by manual pressure for approximately 5 minutes.  Hemostasis was then achieved. IMPRESSION: 1. Successful mechanical thrombectomy of the distal right common carotid artery and carotid bulb with direct contact aspiration followed by new clot formation likely related to a combination of plaque rupture and hypercoagulable state. Multiple (x4) direct contact  aspiration passes performed with subsequent new clot formation. A carotid stent was then placed spanning the distal right common carotid artery and carotid bulb with utilization of a cerebral protection device resulting in complete reperfusion and no further clot formation. 2. No intracranial thrombectomy performed and improvement of the intracranial flow was seen after cervical revascularization with a final TICI 2C status. 3. 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 PLAN: 1. Patient will be transitioned from intravenous cangrelor to dual antiplatelet therapy with aspirin and ticagrelor including a loading dose (180 mg) of ticagrelor. 2. Patient remained intubated after the intervention due to positive COVID 19 test. Electronically Signed   By: Pedro Earls M.D.   On: 02/11/2021 11:30   ECHOCARDIOGRAM COMPLETE  Result Date: 02/10/2021    ECHOCARDIOGRAM REPORT   Patient Name:   Joanna Garcia Date of Exam: 02/10/2021 Medical Rec #:  [id number]       Height:       65.0 in Accession #:    [id number]      Weight:       146.0 lb Date of Birth:  [birth year]       BSA:          1.730 m Patient Age:    22 years        BP:           128/70 mmHg Patient Gender: F               HR:           71 bpm. Exam Location:  Inpatient Procedure: 2D Echo, Color Doppler and Cardiac Doppler Indications:    Stroke  History:        Patient has prior history of Echocardiogram examinations, most                 recent 02/12/2019. Carotid Disease, Signs/Symptoms:Murmur; Risk                 Factors:Hypertension. Covid 19 positive.  Sonographer:    Merrie Roof RDCS Referring Phys: [id number] [place]  1. Left ventricular ejection fraction, by estimation, is 60 to 65%. The left ventricle has normal function. The left ventricle has no regional wall motion abnormalities. Left ventricular diastolic parameters were normal.  2. Right ventricular systolic function is normal. The right  ventricular size is normal. Tricuspid regurgitation signal is inadequate for assessing PA pressure.  3. Mild subvalvular calcification. The mitral valve is grossly normal. Trivial mitral valve regurgitation. No evidence of mitral stenosis.  4. The aortic valve is normal in structure. Aortic valve regurgitation is not visualized. No aortic stenosis is present.  5. The inferior vena cava is normal in size with <50% respiratory variability, suggesting right atrial pressure of 8 mmHg. Conclusion(s)/Recommendation(s): No intracardiac source of embolism detected on this transthoracic study. A transesophageal echocardiogram is recommended to exclude cardiac source of embolism if clinically indicated. FINDINGS  Left Ventricle: Left ventricular ejection fraction, by estimation, is 60 to 65%. The left ventricle has normal function. The left ventricle has no regional wall motion abnormalities. The left ventricular internal cavity size was normal in size. There is  no left ventricular hypertrophy. Left ventricular  diastolic parameters were normal. Right Ventricle: The right ventricular size is normal. No increase in right ventricular wall thickness. Right ventricular systolic function is normal. Tricuspid regurgitation signal is inadequate for assessing PA pressure. Left Atrium: Left atrial size was normal in size. Right Atrium: Right atrial size was normal in size. Pericardium: Trivial pericardial effusion is present. Presence of pericardial fat pad. Mitral Valve: Mild subvalvular calcification. The mitral valve is grossly normal. Trivial mitral valve regurgitation. No evidence of mitral valve stenosis. Tricuspid Valve: The tricuspid valve is normal in structure. Tricuspid valve regurgitation is not demonstrated. No evidence of tricuspid stenosis. Aortic Valve: The aortic valve is normal in structure. Aortic valve regurgitation is not visualized. No aortic stenosis is present. Pulmonic Valve: The pulmonic valve was normal in  structure. Pulmonic valve regurgitation is trivial. No evidence of pulmonic stenosis. Aorta: The aortic root is normal in size and structure. Venous: The inferior vena cava is normal in size with less than 50% respiratory variability, suggesting right atrial pressure of 8 mmHg. IAS/Shunts: No atrial level shunt detected by color flow Doppler.  LEFT VENTRICLE PLAX 2D LVIDd:         4.80 cm Diastology LVIDs:         3.20 cm LV e' medial:    11.00 cm/s LV PW:         0.90 cm LV E/e' medial:  8.3 LV IVS:        0.90 cm LV e' lateral:   10.70 cm/s                        LV E/e' lateral: 8.5  LEFT ATRIUM             Index       RIGHT ATRIUM           Index LA diam:        3.40 cm 1.96 cm/m  RA Area:     13.40 cm LA Vol (A2C):   40.7 ml 23.52 ml/m RA Volume:   27.60 ml  15.95 ml/m LA Vol (A4C):   24.6 ml 14.22 ml/m LA Biplane Vol: 33.9 ml 19.59 ml/m   AORTA Ao Root diam: 3.10 cm MITRAL VALVE MV Area (PHT): 4.49 cm MV Decel Time: 169 msec MV E velocity: 91.00 cm/s MV A velocity: 73.20 cm/s MV E/A ratio:  1.24 Cherlynn Kaiser MD Electronically signed by Cherlynn Kaiser MD Signature Date/Time: 02/10/2021/4:39:22 PM    Final    IR PERCUTANEOUS ART THROMBECTOMY/INFUSION INTRACRANIAL INC DIAG ANGIO  Result Date: 02/11/2021 INDICATION: 45 year old female with past medical history significant for polycythemia, polyneuropathy, alcohol and tobacco use. She presented to [facility] with sudden onset of left-sided weakness. Her last known well was 2:30 p.m. on 02/09/2021. NIHSS 12 at presentation; baseline modified Rankin scale 0. Head CT showed no acute infarct or hemorrhage. CT angiogram of the head and neck showed filling defect within the distal right common carotid artery with occlusion of the proximal cervical right internal carotid artery as well as right M2/MCA occlusion with multiple distal emboli. She was given IV tPA and transferred to our service for a diagnostic cerebral angiogram and mechanical thrombectomy. EXAM:  ULTRASOUND-GUIDED VASCULAR ACCESS DIAGNOSTIC CEREBRAL ANGIOGRAM MECHANICAL THROMBECTOMY RIGHT CAROTID STENTING WITH CEREBRAL PROTECTION DEVICE COMPARISON:  CT/CT angiogram of the head and neck February 09, 2021 MEDICATIONS: Cangrelor IV bolus and drip ANESTHESIA/SEDATION: The procedure was performed under general anesthesia. CONTRAST:  100 mL of Omnipaque 240 milligram/mL FLUOROSCOPY  TIME:  Fluoroscopy Time: 21 minutes 30 seconds (921.1 mGy). COMPLICATIONS: None immediate. TECHNIQUE: Informed written consent was obtained from the patient's mother after a thorough discussion of the procedural risks, benefits and alternatives. All questions were addressed. Maximal Sterile Barrier Technique was utilized including caps, mask, sterile gowns, sterile gloves, sterile drape, hand hygiene and skin antiseptic. A timeout was performed prior to the initiation of the procedure. The right groin was prepped and draped in the usual sterile fashion. Using a micropuncture kit and the modified Seldinger technique, access was gained to the right common femoral artery and an 8 French sheath was placed. Real-time ultrasound guidance was utilized for vascular access including the acquisition of a permanent ultrasound image documenting patency of the accessed vessel. Under fluoroscopy, an 8 [place] Walrus balloon guide catheter was navigated over a 6 [place] Berenstein 2 catheter and a 0.035" Terumo Glidewire into the aortic arch. The catheter was placed into the right common carotid artery. Frontal and lateral angiograms of the neck were obtained. FINDINGS: 1. Common femoral artery has adequate caliber for vascular access. 2. Large burden of clot within the distal right common carotid artery extending into the right carotid bulb with severe stenosis but with distal perfusion. PROCEDURE: The 6 [place] Berenstein 2 catheter was removed and the walrus guide catheter was connected to an aspiration pump and advanced into the distal right common  carotid artery and origin of the right external carotid artery. The guide catheter was retracted into the right common carotid artery. Next, a zoom 71 aspiration catheter was coaxial navigated through the walrus balloon guide catheter into the distal right common carotid artery. The aspiration catheter was connected to an aspiration pump in continuous aspiration was performed in the distal right common carotid artery and right carotid bulb. After clear aspirate was obtained, the aspiration catheter was advanced into the cavernous segment of the right ICA. Frontal and lateral angiograms of the head were obtained. Multiple distal (cortical) intracranial occlusions were noted in the left MCA territory without proximal large or medium vessel occlusion. Delayed angiograms showed improvement of flow with persistent distal occlusion in the left parietal region. The catheter was then retracted into the right carotid bulb, frontal and lateral angiograms of the neck were obtained showing resolution of filling defect in the right carotid bulb. The catheter was then retracted into the right common carotid artery. Frontal and lateral angiograms of the neck were obtained and showed irregular distal right common carotid artery walls, suggestive of possible plaque rupture, with normal flow. Delay right common carotid artery angiogram showed new filling defect within the distal right artery. The zoom 71 aspiration catheter was again connected to an aspiration pump and continuous aspiration was performed. Right common carotid artery angiogram with frontal and lateral views of the head showed no evidence of thromboembolic complication. Vasospasm was noted in the cervical right ICA. The catheter was then retracted into the proximal right common carotid artery. Frontal and lateral angiograms of the neck were obtained showing resolution of the filling defect with persistent wall irregularity of the right common carotid artery and  vasospasm of the cervical right ICA. Intra arterial infusion 5 mg of verapamil was performed into the right common carotid artery. Delayed frontal and lateral angiograms of the neck showed improvement of the vasospasm with new filling defect within the distal right common carotid artery. The zoom 71 aspiration catheter was again connected to an aspiration pump and continuous aspiration was performed. Frontal and lateral angiograms of the neck  were obtained showing resolution of the filling defect with persistent wall irregularity of the right common carotid artery and resolution of vasospasm of the cervical right ICA. Delayed angiogram with frontal and lateral views of the neck showed new clot formation within the right carotid bulb and distal right common carotid artery. At this point, patient was loaded with cangrelor followed by continuous drip. Under biplane roadmap, a 2.5-4.8 Emboshield NAV6 cerebral protection device was navigated to the distal cervical segment of the right ICA. Subsequently, a 6-8 x 40 mm exact carotid stent was deployed spanning the distal right common carotid artery and right carotid bulb, across the area of wall irregularity. Follow-up right common carotid artery angiogram with frontal and lateral views of the head showed improvement of intracranial cortical occlusions now with complete recanalization (TICI2C) with slow flow in a distal parietal cortical branch. The catheter was then placed into the left subclavian artery. Frontal angiogram of the neck was obtained. Atherosclerotic changes in the left subclavian artery are noted with approximally 50% stenosis at the origin of the left vertebral artery. The catheter was again placed into the right common carotid artery. Delay angiograms with magnified frontal lateral views of the neck centered on the recently deployed carotid stent showed excellent anterograde flow without evidence of new in stent clot formation. The catheter was  subsequently withdrawn. A right common femoral artery angiogram was obtained with right anterior oblique view. The puncture is at the level of the mid right common femoral artery. Small caliber of the right common femoral artery is noted, likely related to vasospasm. Intra arterial infusion of 5 mg of verapamil was performed. The 8 French femoral sheath was exchanged over the wire for a 7 French femoral sheath. Then, a 7 [place] Exoseal closure device was utilized for access closure followed by manual pressure for approximately 5 minutes. Hemostasis was then achieved. IMPRESSION: 1. Successful mechanical thrombectomy of the distal right common carotid artery and carotid bulb with direct contact aspiration followed by new clot formation likely related to a combination of plaque rupture and hypercoagulable state. Multiple (x4) direct contact aspiration passes performed with subsequent new clot formation. A carotid stent was then placed spanning the distal right common carotid artery and carotid bulb with utilization of a cerebral protection device resulting in complete reperfusion and no further clot formation. 2. No intracranial thrombectomy performed and improvement of the intracranial flow was seen after cervical revascularization with a final TICI 2C status. 3. 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 PLAN: 1. Patient will be transitioned from intravenous cangrelor to dual antiplatelet therapy with aspirin and ticagrelor including a loading dose (180 mg) of ticagrelor. 2. Patient remained intubated after the intervention due to positive COVID 19 test. Electronically Signed   By: Pedro Earls M.D.   On: 02/11/2021 11:30   CT HEAD CODE STROKE WO CONTRAST  Result Date: 02/09/2021 CLINICAL DATA:  Code stroke. Neuro deficit, acute, stroke suspected. Left-sided weakness and slurred speech. EXAM: CT HEAD WITHOUT CONTRAST TECHNIQUE: Contiguous axial images were obtained  from the base of the skull through the vertex without intravenous contrast. 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 No definite acute infarct, intracranial hemorrhage, mass, midline shift, or extra-axial fluid collection is identified. The ventricles are normal in size. Vascular: Hyperdense right MCA. Skull: No fracture or suspicious osseous lesion. Sinuses/Orbits: Visualized paranasal sinuses and mastoid air cells are clear. Unremarkable orbits. Other: None. ASPECTS (  [place] Stroke Program Early CT Score) - Ganglionic level infarction (caudate, lentiform nuclei, internal capsule, insula, M1-M3 cortex): 7 - Supraganglionic infarction (M4-M6 cortex): 3 Total score (0-10 with 10 being normal): 10 IMPRESSION: 1. 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 2. No definite acute infarct or intracranial hemorrhage. 3. Hyperdense right MCA.  CTA is pending. These results were communicated to Dr. Rory Percy at 3:42 pm on 02/09/2021 by text page via the [facility] messaging system. Electronically Signed   By: Logan Bores M.D.   On: 02/09/2021 15:42   CT ANGIO HEAD NECK W WO CM (CODE STROKE)  Result Date: 02/09/2021 CLINICAL DATA:  Left-sided weakness and slurred speech. EXAM: CT ANGIOGRAPHY HEAD AND NECK TECHNIQUE: Multidetector CT imaging of the head and neck was performed using the standard protocol during bolus administration of intravenous contrast. Multiplanar CT image reconstructions and MIPs were obtained to evaluate the vascular anatomy. Carotid stenosis measurements (when applicable) are obtained utilizing NASCET criteria, using the distal internal carotid diameter as the denominator. CONTRAST:  54m OMNIPAQUE IOHEXOL 350 MG/ML SOLN COMPARISON:  None. FINDINGS: CTA NECK FINDINGS Aortic arch: Normal variant aortic arch branching pattern with common origin of the brachiocephalic and left common carotid arteries. The brachiocephalic and  subclavian arteries are patent although streak artifact limits assessment of the proximal right subclavian artery. Right carotid system: The common carotid artery is patent with calcified and soft plaque as well as possible intraluminal thrombus in the distal common carotid artery resulting in approximately 75% stenosis. The internal carotid artery is occluded just beyond its origin with faint reconstitution just below the skull base. Left carotid system: The common carotid artery is occluded. There is reconstitution of the internal and external carotid arteries at the carotid bifurcation. The cervical ICA is patent and uniform in caliber. Vertebral arteries: The vertebral arteries are patent with the left being strongly dominant. Partially calcified plaque at the left vertebral origin results in mild-to-moderate stenosis. Skeleton: Advanced facet arthrosis at C3-4.  Edentulous. Other neck: 1 cm calcified right thyroid nodule for which no imaging follow-up is recommended. Upper chest: Emphysema with biapical bullae. Review of the MIP images confirms the above findings CTA HEAD FINDINGS Anterior circulation: The intracranial right ICA is patent with a thready appearance of the petrous segment and more robust opacification of the cavernous segment through the terminus. There is mild narrowing of the right paraclinoid ICA. The intracranial left ICA is patent without a significant stenosis. The right M1 segment is widely patent, however there is a severe stenosis or subocclusive embolus involving the proximal right M2 inferior division with multiple missing smaller branch vessels. The left MCA and both ACAs are patent without evidence of a significant proximal stenosis. No aneurysm is identified. Posterior circulation: The intracranial vertebral arteries are patent to the basilar with the right being particularly diminutive distal to the PICA origin. There is minimal nonstenotic plaque in the left V4 segment. The  basilar artery is widely patent. There are patent posterior communicating arteries bilaterally. Both PCAs are patent without evidence of a significant proximal stenosis. No aneurysm is identified. Venous sinuses: As permitted by contrast timing, patent. Anatomic variants: Strongly dominant left vertebral artery. Review of the MIP images confirms the above findings IMPRESSION: 1. 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 2. 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 3. Proximal right M2 severe stenosis or subocclusive embolus with multiple missing right MCA branch vessels. 4. 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 5. Mild-to-moderate left vertebral artery origin stenosis. 6.  Emphysema (ICD10-J43.9). These results were called by telephone at the time of interpretation on 02/09/2021 at 3:49 pm to Dr. Amie Portland, who verbally acknowledged these results. Electronically Signed   By: Logan Bores M.D.   On: 02/09/2021 16:07   IR ANGIO INTRA EXTRACRAN SEL COM CAROTID INNOMINATE UNI L MOD SED  Result Date: 02/11/2021 INDICATION: 45 year old female with past medical history significant for polycythemia, polyneuropathy, alcohol and tobacco use. She presented to [facility] with sudden onset of left-sided weakness. Her last known well was 2:30 p.m. on 02/09/2021. NIHSS 12 at presentation; baseline modified Rankin scale 0. Head CT showed no acute infarct or hemorrhage. CT angiogram of the head and neck showed filling defect within the distal right common carotid artery with occlusion of the proximal cervical right internal carotid artery as well as right M2/MCA occlusion with multiple distal emboli. She was given IV tPA and transferred to our service for a diagnostic cerebral angiogram and mechanical thrombectomy. EXAM: ULTRASOUND-GUIDED VASCULAR ACCESS DIAGNOSTIC CEREBRAL ANGIOGRAM MECHANICAL THROMBECTOMY RIGHT  CAROTID STENTING WITH CEREBRAL PROTECTION DEVICE COMPARISON:  CT/CT angiogram of the head and neck February 09, 2021 MEDICATIONS: Cangrelor IV bolus and drip ANESTHESIA/SEDATION: The procedure was performed under general anesthesia. CONTRAST:  100 mL of Omnipaque 240 milligram/mL FLUOROSCOPY TIME:  Fluoroscopy Time: 21 minutes 30 seconds (921.1 mGy). COMPLICATIONS: None immediate. TECHNIQUE: Informed written consent was obtained from the patient's mother after a thorough discussion of the procedural risks, benefits and alternatives. All questions were addressed. Maximal Sterile Barrier Technique was utilized including caps, mask, sterile gowns, sterile gloves, sterile drape, hand hygiene and skin antiseptic. A timeout was performed prior to the initiation of the procedure. The right groin was prepped and draped in the usual sterile fashion. Using a micropuncture kit and the modified Seldinger technique, access was gained to the right common femoral artery and an 8 French sheath was placed. Real-time ultrasound guidance was utilized for vascular access including the acquisition of a permanent ultrasound image documenting patency of the accessed vessel. Under fluoroscopy, an 8 [place] Walrus balloon guide catheter was navigated over a 6 [place] Berenstein 2 catheter and a 0.035" Terumo Glidewire into the aortic arch. The catheter was placed into the right common carotid artery. Frontal and lateral angiograms of the neck were obtained. FINDINGS: 1. Common femoral artery has adequate caliber for vascular access. 2. Large burden of clot within the distal right common carotid artery extending into the right carotid bulb with severe stenosis but with distal perfusion. PROCEDURE: The 6 [place] Berenstein 2 catheter was removed and the walrus guide catheter was connected to an aspiration pump and advanced into the distal right common carotid artery and origin of the right external carotid artery. The guide catheter was retracted  into the right common carotid artery. Next, a zoom 71 aspiration catheter was coaxial navigated through the walrus balloon guide catheter into the distal right common carotid artery. The aspiration catheter was connected to an aspiration pump in continuous aspiration was performed in the distal right common carotid artery and right carotid bulb. After clear aspirate was obtained, the aspiration catheter was advanced into the cavernous segment of the right ICA. Frontal and lateral angiograms of the head were obtained. Multiple distal (cortical) intracranial occlusions were noted in the left MCA territory without proximal large or medium vessel occlusion. Delayed angiograms showed improvement of flow with persistent distal occlusion in the left parietal  region. The catheter was then retracted into the right carotid bulb, frontal and lateral angiograms of the neck were obtained showing resolution of filling defect in the right carotid bulb. The catheter was then retracted into the right common carotid artery. Frontal and lateral angiograms of the neck were obtained and showed irregular distal right common carotid artery walls, suggestive of possible plaque rupture, with normal flow. Delay right common carotid artery angiogram showed new filling defect within the distal right artery. The zoom 71 aspiration catheter was again connected to an aspiration pump and continuous aspiration was performed. Right common carotid artery angiogram with frontal and lateral views of the head showed no evidence of thromboembolic complication. Vasospasm was noted in the cervical right ICA. The catheter was then retracted into the proximal right common carotid artery. Frontal and lateral angiograms of the neck were obtained showing resolution of the filling defect with persistent wall irregularity of the right common carotid artery and vasospasm of the cervical right ICA. Intra arterial infusion 5 mg of verapamil was performed into the  right common carotid artery. Delayed frontal and lateral angiograms of the neck showed improvement of the vasospasm with new filling defect within the distal right common carotid artery. The zoom 71 aspiration catheter was again connected to an aspiration pump and continuous aspiration was performed. Frontal and lateral angiograms of the neck were obtained showing resolution of the filling defect with persistent wall irregularity of the right common carotid artery and resolution of vasospasm of the cervical right ICA. Delayed angiogram with frontal and lateral views of the neck showed new clot formation within the right carotid bulb and distal right common carotid artery. At this point, patient was loaded with cangrelor followed by continuous drip. Under biplane roadmap, a 2.5-4.8 Emboshield NAV6 cerebral protection device was navigated to the distal cervical segment of the right ICA. Subsequently, a 6-8 x 40 mm exact carotid stent was deployed spanning the distal right common carotid artery and right carotid bulb, across the area of wall irregularity. Follow-up right common carotid artery angiogram with frontal and lateral views of the head showed improvement of intracranial cortical occlusions now with complete recanalization (TICI2C) with slow flow in a distal parietal cortical branch. The catheter was then placed into the left subclavian artery. Frontal angiogram of the neck was obtained. Atherosclerotic changes in the left subclavian artery are noted with approximally 50% stenosis at the origin of the left vertebral artery. The catheter was again placed into the right common carotid artery. Delay angiograms with magnified frontal lateral views of the neck centered on the recently deployed carotid stent showed excellent anterograde flow without evidence of new in stent clot formation. The catheter was subsequently withdrawn. A right common femoral artery angiogram was obtained with right anterior oblique view.  The puncture is at the level of the mid right common femoral artery. Small caliber of the right common femoral artery is noted, likely related to vasospasm. Intra arterial infusion of 5 mg of verapamil was performed. The 8 French femoral sheath was exchanged over the wire for a 7 French femoral sheath. Then, a 7 [place] Exoseal closure device was utilized for access closure followed by manual pressure for approximately 5 minutes. Hemostasis was then achieved. IMPRESSION: 1. Successful mechanical thrombectomy of the distal right common carotid artery and carotid bulb with direct contact aspiration followed by new clot formation likely related to a combination of plaque rupture and hypercoagulable state. Multiple (x4) direct contact aspiration passes performed with subsequent new  clot formation. A carotid stent was then placed spanning the distal right common carotid artery and carotid bulb with utilization of a cerebral protection device resulting in complete reperfusion and no further clot formation. 2. No intracranial thrombectomy performed and improvement of the intracranial flow was seen after cervical revascularization with a final TICI 2C status. 3. 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 PLAN: 1. Patient will be transitioned from intravenous cangrelor to dual antiplatelet therapy with aspirin and ticagrelor including a loading dose (180 mg) of ticagrelor. 2. Patient remained intubated after the intervention due to positive COVID 19 test. Electronically Signed   By: Pedro Earls M.D.   On: 02/11/2021 11:30    No images are attached to the encounter.   CMP Latest Ref Rng & Units 02/11/2021  Glucose 70 - 99 mg/dL 93  BUN 6 - 20 mg/dL 9  Creatinine 0.44 - 1.00 mg/dL 0.76  Sodium 135 - 145 mmol/L 141  Potassium 3.5 - 5.1 mmol/L 3.8  Chloride 98 - 111 mmol/L 106  CO2 22 - 32 mmol/L 25  Calcium 8.9 - 10.3 mg/dL 8.5(L)  Total Protein 6.5 - 8.1 g/dL -  Total  Bilirubin 0.3 - 1.2 mg/dL -  Alkaline Phos 38 - 126 U/L -  AST 15 - 41 U/L -  ALT 0 - 44 U/L -   CBC Latest Ref Rng & Units 02/18/2021  WBC 4.0 - 10.5 K/uL 4.7  Hemoglobin 12.0 - 15.0 g/dL 13.2  Hematocrit 36.0 - 46.0 % 40.3  Platelets 150 - 400 K/uL 256     Assessment and plan: Patient is a 45 year old female referred for polycythemia and this is a visit to discuss results of blood work  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  Myeloid intelligent mutation panel negative.  Likelihood that this is a primary polycythemia vera is low.  I would like to get a bone marrow biopsy to rule out a myeloproliferative process, however prior to getting a bone marrow biopsy we need to replenish any nutritional deficiencies that could potentially mask a hyper proliferative process of the bone marrow.  Patient's folic acid level is currently lowAt 3 likely secondary to her alcohol intake and I have sent her a prescription for folic acid 2 mg daily.  Her ferritin levels are also low indicating of iron deficiency and I  have asked her to take oral iron every other day.  Once her folate and iron deficiency is corrected, I will proceed with a bone marrow biopsy at that time.  Presently patient's hemoglobin is 13.2 and she does not require phlebotomy at this time  Macrocytosis without anemia: Likely secondary to folate deficiency and alcohol intake.  I have strongly encouraged the patient to quit alcohol or reduce the consumption of alcohol  Recent stroke: Polycythemia and does not appear to be an overt reason for her stroke at this time.  She also underwent a hypercoagulable work-up including antiphospholipid antibody syndrome testing factor V Leiden and prothrombin gene mutation which was negative.  From a hematology standpoint elevated homocystine levels are not usually factored in work-up of stroke and I will defer this to neurology.  Anticoagulation recommendations for stroke will  also be as per neurology  Patient was wanting a port placement for future phlebotomies.  Typically port placement is not recommended for polycythemia in general and patient does not appear to have polycythemia vera based on her blood work.  Presently her hemoglobin  is not high enough to warrant a phlebotomy at this time.  I will repeat CBC ferritin and iron studies and folate in 1 month and see her thereafter.  Patient verbalized understanding of the plan  Follow-up instructions:  I discussed the assessment and treatment plan with the patient. The patient was provided an opportunity to ask questions and all were answered. The patient agreed with the plan and demonstrated an understanding of the instructions.   The patient was advised to call back or seek an in-person evaluation if the symptoms worsen or if the condition fails to improve as anticipated.    Visit Diagnosis: 1. Folate deficiency   2. Iron deficiency   3. Macrocytosis without anemia   4. Polycythemia     Dr. Randa Evens, MD, MPH [place] [facility] Tel- [phone] 02/19/2021 11:01 AM

## 2021-02-19 NOTE — Telephone Encounter (Signed)
Left VM with patient--Dr. Janese Banks would like to have a virtual visit with her today. Requested she please call back to schedule.

## 2021-02-20 ENCOUNTER — Encounter: Payer: Self-pay | Admitting: *Deleted

## 2021-02-20 ENCOUNTER — Other Ambulatory Visit: Payer: Self-pay | Admitting: *Deleted

## 2021-02-20 NOTE — Patient Outreach (Signed)
[facility]) Care Management  02/20/2021  Joanna Garcia 02/01/1976 [id number]   [facility] EMMI red alert outreach  Joanna Garcia was referred to [facility] on 02/19/21 for EMMI stroke Red Flag alert from 02/18/21 Wednesday 1300 for Smoked or been around smoke? Yes  8/22-24/22 [place] admission for acute ischemic right MCA stroke    Insurance -none- medicaid potential  Joanna Garcia was able to verify HIPAA identifiers  EMMI Red alert follow up  Joanna Garcia reports she was administered a smoke cessation patch during her 2 day hospitalization but on today she has no interest in smoke cessation She reports she still smokes but has cut  "I am not ready" "I have to make up my mind" RN CM thanked her for her candor, provided empathy and encouragement related to her medical concerns. RN CM voiced being available to assist her with smoke cessation resources.. RN CM discussed the [facility] EMMI stroke program and available period of time RN CM will be available. RN CM encouraged a return call to RN CM but informed pt she will receive some resources via mail also She voiced understanding of the program, RN CM's availability to assist and resources via mail  Patient Active Problem List   Diagnosis Date Noted   COVID-19 virus infection 02/10/2021   Acute ischemic right MCA stroke ([place]) 02/09/2021   Endotracheally intubated 02/09/2021   Macrocytosis without anemia 02/09/2021   Erythrocytosis 01/28/2021   Elevated hemoglobin ([place]) 01/23/2021   Leukocytosis 01/23/2021   UTI (urinary tract infection) 06/05/2019   Symptomatic anemia 03/14/2019   GI bleed 03/14/2019   Superficial thrombophlebitis 02/12/2019   Thrombocytosis 02/12/2019   GERD (gastroesophageal reflux disease) 02/12/2019   Essential hypertension 02/12/2019    Plan RN CM will be available to assist pt with further resources as needed as discussed with her during the EMMI stroke 30 day period She prefers to return a call to  RN CM prn  Sent information on 9/12-10/3/22 [place] live [facility] well smoke cessation classes and EMMIs on smoke cessation/risks of strokes via mail Forward note to listed pcp office    Keithan Dileonardo L. Lavina Hamman, RN, BSN, [place] Coordinator Office number [phone] [facility] number [phone] Fax number [phone]

## 2021-02-22 ENCOUNTER — Encounter: Payer: Self-pay | Admitting: Oncology

## 2021-02-24 ENCOUNTER — Other Ambulatory Visit: Payer: Self-pay

## 2021-02-24 ENCOUNTER — Ambulatory Visit (HOSPITAL_COMMUNITY)
Admission: RE | Admit: 2021-02-24 | Discharge: 2021-02-24 | Disposition: A | Discharge status: Home / Self Care | Payer: Self-pay | Source: Ambulatory Visit | Attending: Neuroradiology | Admitting: Neuroradiology

## 2021-02-24 DIAGNOSIS — I639 Cerebral infarction, unspecified: Secondary | ICD-10-CM

## 2021-02-24 NOTE — Progress Notes (Signed)
Referring Physician(s): de Macedo Rodrigues,Joanna Garcia  Chief Complaint: The patient is seen in follow up today s/p mechanical thrombectomy and stenting of right CCA/ICA.  History of present illness:  4[name] is a 45-year-old female with past medical history significant for polycythemia, polyneuropathy, alcohol and tobacco use. She presented to [facility] with sudden onset of left-sided weakness. Her last known well was 2:30 p.m. on 02/09/2021. NIHSS 12 at presentation; baseline modified Rankin scale 0. Head CT showed no acute infarct or hemorrhage. CT angiogram of the head and neck showed filling defect within the distal right common carotid artery with occlusion of the proximal cervical right internal carotid artery as well as right M2/MCA occlusion with multiple distal emboli. She was given IV tPA and transferred to our service for a diagnostic cerebral angiogram and mechanical thrombectomy. During angiogram, right carotid angiogram showed filling defect with near occlusion of the right carotid bifurcation. Aspiration was performed multiple times, but due to subsequent reocclusion, a right carotid stent was deployed across the carotid bifurcation. Intracranial angiogram showed interval recanalization of the right M2/MCA with slow distal flow (TICI2C). She had a remarkable recover and currently complains only of impaired fine dexterity of left hand.  Past Medical History:  Diagnosis Date   DVT (deep venous thrombosis) (HCC)    GERD (gastroesophageal reflux disease)    Hypertension    Polyneuropathy     Past Surgical History:  Procedure Laterality Date   COLONOSCOPY N/A 03/16/2019   Procedure: COLONOSCOPY;  Surgeon: Joanna Garcia, Joanna Pike, MD;  Location: [facility] ENDOSCOPY;  Service: Gastroenterology;  Laterality: N/A;   ESOPHAGOGASTRODUODENOSCOPY N/A 03/16/2019   Procedure: ESOPHAGOGASTRODUODENOSCOPY (EGD);  Surgeon: Joanna Garcia, Joanna Pike, MD;  Location: [facility] ENDOSCOPY;  Service: Gastroenterology;   Laterality: N/A;   IR ANGIO INTRA EXTRACRAN SEL COM CAROTID INNOMINATE UNI Garcia MOD SED  02/09/2021   IR INTRAVSC STENT CERV CAROTID W/EMB-PROT MOD SED INCL ANGIO  02/09/2021   IR PERCUTANEOUS ART THROMBECTOMY/INFUSION INTRACRANIAL INC DIAG ANGIO  02/09/2021   IR US GUIDE VASC ACCESS RIGHT  02/09/2021   NO PAST SURGERIES     RADIOLOGY WITH ANESTHESIA N/A 02/09/2021   Procedure: IR WITH ANESTHESIA;  Surgeon: Radiologist, Medication, MD;  Location: [place];  Service: Radiology;  Laterality: N/A;    Allergies: Patient has no known allergies.  Medications: Prior to Admission medications   Medication Sig Start Date End Date Taking? Authorizing Provider  aspirin 81 MG chewable tablet Chew and swallow 1 tablet (81 mg total) by mouth daily. 02/12/21   Joanna Garcia, Joanna Chars, PA-C  atorvastatin (LIPITOR) 40 MG tablet Take 1 tablet (40 mg total) by mouth daily. 02/12/21   Joanna Garcia, Joanna Chars, PA-C  folic acid (FOLVITE) 1 MG tablet Take 2 tablets (2 mg total) by mouth daily. 02/19/21   Joanna Guadeloupe, MD  gabapentin (NEURONTIN) 300 MG capsule Take 1 capsule (300 mg total) by mouth 4 (four) times daily. 02/11/21   Joanna Garcia, Joanna L, PA-C  lisinopril (ZESTRIL) 10 MG tablet Take 10 mg by mouth daily. 02/16/21   [provider]  Multiple Vitamins-Minerals (CERTAVITE/ANTIOXIDANTS) TABS Take 1 tablet by mouth daily. 02/12/21   Joanna Garcia, Joanna Chars, PA-C  nicotine (NICODERM CQ - DOSED IN MG/24 HOURS) 14 mg/24hr patch Place 1 patch (14 mg total) onto the skin daily as needed (hx of smoking). 02/11/21   Joanna Garcia, Joanna Chars, PA-C  omeprazole (PRILOSEC) 40 MG capsule Take 1 capsule (40 mg total) by mouth daily. 03/16/19   Joanna Mango, MD  thiamine 100  MG tablet Take 1 tablet (100 mg total) by mouth daily. 02/12/21   Joanna Garcia, Joanna Chars, PA-C  ticagrelor (BRILINTA) 90 MG TABS tablet Take 1 tablet (90 mg total) by mouth 2 (two) times daily. 02/11/21   Joanna Garcia, Joanna Chars, PA-C     Family History  Problem Relation Age of Onset    Hypertension Mother    Hyperlipidemia Mother    Heart disease Mother    COPD Mother    Hypertension Father    Hyperlipidemia Father    Cancer Father        prostate   Leukemia Paternal Uncle    Cancer Paternal Grandmother    Breast cancer Paternal Grandmother     Social History   Socioeconomic History   Marital status: Divorced    Spouse name: Not on file   Number of children: Not on file   Years of education: Not on file   Highest education level: Not on file  Occupational History   Not on file  Tobacco Use   Smoking status: Every Day    Packs/day: 1.00    Types: Cigarettes   Smokeless tobacco: Never  Vaping Use   Vaping Use: Never used  Substance and Sexual Activity   Alcohol use: Yes    Comment: 21 Oz daily   Drug use: Not Currently    Types: Marijuana   Sexual activity: Not on file  Other Topics Concern   Not on file  Social History Narrative   Lives at home with mother and 2 dogs Charlie and Personal assistant    Drives   Social Determinants of Health   Financial Resource Strain: Not on file  Food Insecurity: No Food Insecurity   Worried About Charity fundraiser in the Last Year: Never true   Arboriculturist in the Last Year: Never true  Transportation Needs: No Transportation Needs   Lack of Transportation (Medical): No   Lack of Transportation (Non-Medical): No  Physical Activity: Not on file  Stress: No Stress Concern Present   Feeling of Stress : Only a little  Social Connections: Not on file     Vital Signs: LMP 08/28/2017   Physical Exam Constitutional:      Appearance: Normal appearance.  Neurological:     Mental Status: She is alert and oriented to person, place, and time.     Cranial Nerves: Cranial nerve deficit present.     Sensory: Sensory deficit present.     Motor: No weakness.     Comments: Subtle decreased sensation on left lower hemiface.    Imaging: No results found.  Labs:  CBC: Recent Labs    02/09/21 1530 02/09/21 2137  02/10/21 0711 02/11/21 0436 02/18/21 0950  WBC 4.5  --  7.6 5.4 4.7  HGB 15.9* 15.0 13.7 12.4 13.2  HCT 46.2* 44.0 41.7 37.8 40.3  PLT 161  --  171 126* 256    COAGS: Recent Labs    02/09/21 1530 02/10/21 0711  INR 1.0 1.1  APTT 26  --     BMP: Recent Labs    01/09/21 1408 02/09/21 1530 02/09/21 2137 02/10/21 0711 02/11/21 0436  NA 133* 137 139 139 141  K 3.4* 3.4* 3.8 3.8 3.8  CL 95* 103  --  107 106  CO2 26 27  --  23 25  GLUCOSE 108* 114*  --  139* 93  BUN 10 8  --  8 9  CALCIUM 9.5 8.3*  --  8.3* 8.5*  CREATININE 0.95 0.77  --  0.80 0.76  GFRNONAA >60 >60  --  >60 >60    LIVER FUNCTION TESTS: Recent Labs    01/09/21 1408 02/09/21 1530 02/10/21 0711  BILITOT 0.6 0.6 0.3  AST '26 23 25  '$ ALT '22 31 25  '$ ALKPHOS 79 70 59  PROT 7.9 6.2* 5.4*  ALBUMIN 4.3 3.5 2.8*    Assessment:  I reviewed images obtained during Joanna Garcia hospitalization for stroke last month. I explained she had an acute occlusion of her right ICA requiring stent placement in the setting of chronic occlusion of the left CCA. We discussed the importance of dual anti-platelet therapy which she was instructed to continue until further notice. We will check her right ICA stent with a carotid duplex in 3 months. Decision on continuing anti-platelet therapy will be made at that point. She was instructed to call with questions in the meantime.   Signed: Pedro Earls, MD 02/24/2021, 4:33 PM    I spent a total of    25 Minutes in face to face in clinical consultation, greater than 50% of which was counseling/coordinating care for right carotid disease/stenting.

## 2021-02-26 ENCOUNTER — Ambulatory Visit: Payer: Self-pay

## 2021-03-02 ENCOUNTER — Telehealth (HOSPITAL_COMMUNITY): Payer: Self-pay | Admitting: Radiology

## 2021-03-02 NOTE — Telephone Encounter (Signed)
Pt called and states that her PCP has restarted her Lisinopril and she is also now on iron. Per Dr. Debbrah Alar those medications are fine. JM

## 2021-03-05 ENCOUNTER — Other Ambulatory Visit: Payer: Self-pay

## 2021-03-05 ENCOUNTER — Other Ambulatory Visit: Payer: Self-pay | Admitting: *Deleted

## 2021-03-05 ENCOUNTER — Telehealth (HOSPITAL_COMMUNITY): Payer: Self-pay

## 2021-03-05 ENCOUNTER — Other Ambulatory Visit (HOSPITAL_COMMUNITY): Payer: Self-pay

## 2021-03-05 ENCOUNTER — Encounter: Payer: Self-pay | Admitting: *Deleted

## 2021-03-05 NOTE — Telephone Encounter (Signed)
Pharmacy Transitions of Care Follow-up Telephone Call  Date of discharge: 02/11/21  Discharge Diagnosis: Acute ischemic stroke  How have you been since you were released from the hospital?  Patient doing well. Has nearly recovered all movement lost from stroke. No questions about medications at this time  Medication changes made at discharge:  - START:  Aspirin Low Dose (aspirin)  atorvastatin (LIPITOR)  Brilinta (ticagrelor)  CertaVite/Antioxidants  folic acid (FOLVITE)  nicotine (NICODERM CQ - dosed in mg/24 hours)  thiamine   - STOPPED:  ASA Lisinopril/HCTZ  - CHANGED:  gabapentin  Medication changes verified by the patient?  Yes    Medication Accessibility:  Home Pharmacy: [address]   Was the patient provided with refills on discharged medications? yes   Have all prescriptions been transferred from [facility] to home pharmacy?  yes  Is the patient able to afford medications? No insurance Notable copays: Brilinta Eligible patient assistance: MAP  Encourage smoking cessation    Medication Review:  TICAGRELOR (BRILINTA) Ticagrelor 90 mg BID initiated on 02/11/21.  - Educated patient on expected duration of therapy of aspirin '81mg'$  with ticagrelor.  - Discussed importance of taking medication around the same time every day, - Reviewed potential DDIs with patient - Advised patient of medications to avoid (NSAIDs, aspirin maintenance doses>100 mg daily) - Educated that Tylenol (acetaminophen) will be the preferred analgesic to prevent risk of bleeding  - Emphasized importance of monitoring for signs and symptoms of bleeding (abnormal bruising, prolonged bleeding, nose bleeds, bleeding from gums, discolored urine, black tarry stools)  - Educated patient to notify doctor if shortness of breath or abnormal heartbeat occur - Advised patient to alert all providers of antiplatelet therapy prior to starting a new medication or having a procedure     Follow-up Appointments:  PCP Hospital f/u appt confirmed?   Patient has already seen PCP post discharge  [facility] f[facility]u appt confirmed? [facility], Scheduled to see Dr. Leonie Man on 11/16 @ 11 am.   If their condition worsens, is the pt aware to call PCP or go to the Emergency Dept.? yes  Final Patient Assessment:  Patient has refills at home pharmacy and has already seen PCP, has future follow up scheduled.

## 2021-03-05 NOTE — Patient Outreach (Signed)
[facility]) Care Management  03/05/2021  AALISHA ROEHRIG [birth year] EX:[id number]   [facility] EMMI stroke red alert outreach follow up with case closure    Mrs LATANGA LEISURE was referred to [facility] on 02/19/21 for EMMI stroke Red Flag alert from 02/18/21 Wednesday 1300 for Smoked or been around smoke? Yes   8/22-24/22 [place] admission for acute ischemic right MCA stroke      Insurance -none- medicaid potential   Mrs Oldaker was able to verify HIPAA identifiers   EMMI Red alert follow up Shamiqua reports she is doing good  She confirms she received the smoke cessation resources but is still at this time not interested in stopping  She was offered future option of outreaching as needed  Plan [facility] case closure as she is only eligible for EMMI stroke services and denies further needs during this outreach.  She is appreciative of the outreaches and is aware she can outreach prn in the future She agrees with case closure    Goals Addressed               This Visit's Progress     Patient Stated     COMPLETED: Stop or Cut Down Tobacco Use (THN) (pt-stated)   Not on track     Timeframe:  Short-Term Goal Priority:  Low Start Date:         02/19/21             Expected End Date:   03/21/21                    Case closure  03/05/21  Barriers: Health Behaviors Knowledge    - cut down amount of tobacco product used (chew, cigars)    Notes:  03/05/21 follow up  received the smoke cessation resources but is still at this time not interested in stopping  She was offered future option of outreaching as needed agrees to [facility] case closure  02/20/21 reports she is not ready to quit but has made attempt to cut back.        Molly Maselli L. Lavina Hamman, RN, BSN, [place] Coordinator Office number [phone] Main [facility] number [phone] Fax number [phone]

## 2021-03-10 ENCOUNTER — Inpatient Hospital Stay: Payer: Self-pay | Admitting: Neurology

## 2021-03-11 ENCOUNTER — Ambulatory Visit: Payer: Self-pay | Admitting: Oncology

## 2021-03-24 ENCOUNTER — Other Ambulatory Visit: Payer: Self-pay

## 2021-03-24 ENCOUNTER — Inpatient Hospital Stay: Payer: Self-pay | Attending: Oncology

## 2021-03-24 DIAGNOSIS — D751 Secondary polycythemia: Secondary | ICD-10-CM | POA: Insufficient documentation

## 2021-03-24 DIAGNOSIS — E538 Deficiency of other specified B group vitamins: Secondary | ICD-10-CM | POA: Insufficient documentation

## 2021-03-24 DIAGNOSIS — D7589 Other specified diseases of blood and blood-forming organs: Secondary | ICD-10-CM | POA: Insufficient documentation

## 2021-03-24 LAB — FOLATE: Folate: 25 ng/mL (ref >5.9)

## 2021-03-24 LAB — CBC WITH DIFFERENTIAL/PLATELET
Abs Immature Granulocytes: 0.04 10*3/uL (ref 0.00–0.07)
Basophils Absolute: 0.1 10*3/uL (ref 0.0–0.1)
Basophils Relative: 1 %
Eosinophils Absolute: 0.2 10*3/uL (ref 0.0–0.5)
Eosinophils Relative: 2 %
HCT: 44.1 % (ref 36.0–46.0)
Hemoglobin: 14.6 g/dL (ref 12.0–15.0)
Immature Granulocytes: 0 %
Lymphocytes Relative: 19 %
Lymphs Abs: 1.9 10*3/uL (ref 0.7–4.0)
MCH: 33.5 pg (ref 26.0–34.0)
MCHC: 33.1 g/dL (ref 30.0–36.0)
MCV: 101.1 fL — ABNORMAL HIGH (ref 80.0–100.0)
Monocytes Absolute: 0.7 10*3/uL (ref 0.1–1.0)
Monocytes Relative: 7 %
Neutro Abs: 7 10*3/uL (ref 1.7–7.7)
Neutrophils Relative %: 71 %
Platelets: 178 10*3/uL (ref 150–400)
RBC: 4.36 MIL/uL (ref 3.87–5.11)
RDW: 14.4 % (ref 11.5–15.5)
WBC: 9.9 10*3/uL (ref 4.0–10.5)
nRBC: 0 % (ref 0.0–0.2)

## 2021-03-24 LAB — VITAMIN B12: Vitamin B-12: 244 pg/mL (ref 180–914)

## 2021-03-24 LAB — TSH: TSH: 1.564 u[IU]/mL (ref 0.350–4.500)

## 2021-03-25 ENCOUNTER — Encounter: Payer: Self-pay | Admitting: Oncology

## 2021-03-30 ENCOUNTER — Encounter: Payer: Self-pay | Admitting: Oncology

## 2021-03-30 ENCOUNTER — Other Ambulatory Visit: Payer: Self-pay

## 2021-03-30 ENCOUNTER — Inpatient Hospital Stay: Payer: Self-pay | Attending: Oncology | Admitting: Oncology

## 2021-03-30 VITALS — BP 158/98 | HR 102 | Temp 98.3°F | Resp 16 | Wt 146.5 lb

## 2021-03-30 DIAGNOSIS — E611 Iron deficiency: Secondary | ICD-10-CM

## 2021-03-30 DIAGNOSIS — F1020 Alcohol dependence, uncomplicated: Secondary | ICD-10-CM | POA: Insufficient documentation

## 2021-03-30 DIAGNOSIS — D7589 Other specified diseases of blood and blood-forming organs: Secondary | ICD-10-CM

## 2021-03-30 DIAGNOSIS — D751 Secondary polycythemia: Secondary | ICD-10-CM | POA: Insufficient documentation

## 2021-03-30 DIAGNOSIS — Z8673 Personal history of transient ischemic attack (TIA), and cerebral infarction without residual deficits: Secondary | ICD-10-CM | POA: Insufficient documentation

## 2021-03-30 DIAGNOSIS — E538 Deficiency of other specified B group vitamins: Secondary | ICD-10-CM | POA: Insufficient documentation

## 2021-03-30 NOTE — Progress Notes (Signed)
Pt states her menstrual cycles have been a lot heavier since she started the [place] back in August.

## 2021-03-30 NOTE — Progress Notes (Signed)
Pt had her video appt and all the info md wanted her to know was told to her today

## 2021-03-30 NOTE — Progress Notes (Signed)
Hematology/Oncology Consult note [facility]  Telephone:([phone] Fax:([phone]  Patient Care Team: Letta Median, MD as PCP - General (Family Medicine)   Name of the patient: Joanna Garcia  [id number]  Oct 25, 1975   Date of visit: 03/30/21  Diagnosis- 1.  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  History of iron deficiency 3.  History of polycythemia  Chief complaint/ Reason for visit-routine follow-up of macrocytosis  Heme/Onc history: patient is a 45 year old female who was evaluated by NP Rulon Abide for polycythemia.Patient has had a macrocytic anemia upon 09/07/2020 and her MCV has been between 10 2-1 27.  Most recently on 01/09/2021 she was noted to have a white count of 16.2, H&H of 19.5/55.7 with an MCV of 106 and a platelet count of 217.  JAK2 mutation testing x1 was inconclusive and the second JAK2 mutation testing was negative.  Patient has had some history of folate deficiency back in 2020 along with severe B12 deficiency.Her hemoglobin improved significantly since then but macrocytosis has persisted.   She is an everyday smoker but does not report any trouble sleeping.  She has some ongoing fatigue.  No history of chronic lung disease.Urinalysis did not show any evidence of hematuria.  T4 level was low normal at 4.9   Plan was to proceed with bone marrow biopsy to a certain if polycythemia is primary or secondary.  2 sessions of phlebotomy were attempted for polycythemia but 500 cc could not be taken out as blood appeared to be "too thick" per infusion.   Before patient could get a bone marrow biopsy she was admitted to [facility] with symptoms of acute stroke and was discharged on Brilinta and aspirin    Interval history-patient continues to smoke every day.  She drinks about 2 alcoholic drinks a day.  She is currently taking her folate as well as iron supplements.  Reports that her menstrual  cycles are heavy after she started taking Brilinta.  ECOG PS- 1 Pain scale- 0   Review of systems- Review of Systems  Constitutional:  Positive for malaise/fatigue. Negative for chills, fever and weight loss.  HENT:  Negative for congestion, ear discharge and nosebleeds.   Eyes:  Negative for blurred vision.  Respiratory:  Negative for cough, hemoptysis, sputum production, shortness of breath and wheezing.   Cardiovascular:  Negative for chest pain, palpitations, orthopnea and claudication.  Gastrointestinal:  Negative for abdominal pain, blood in stool, constipation, diarrhea, heartburn, melena, nausea and vomiting.  Genitourinary:  Negative for dysuria, flank pain, frequency, hematuria and urgency.  Musculoskeletal:  Negative for back pain, joint pain and myalgias.  Skin:  Negative for rash.  Neurological:  Negative for dizziness, tingling, focal weakness, seizures, weakness and headaches.  Endo/Heme/Allergies:  Does not bruise/bleed easily.  Psychiatric/Behavioral:  Negative for depression and suicidal ideas. The patient does not have insomnia.      No Known Allergies   Past Medical History:  Diagnosis Date   DVT (deep venous thrombosis) (HCC)    GERD (gastroesophageal reflux disease)    Hypertension    Polyneuropathy      Past Surgical History:  Procedure Laterality Date   COLONOSCOPY N/A 03/16/2019   Procedure: COLONOSCOPY;  Surgeon: Toledo, Benay Pike, MD;  Location: [facility] ENDOSCOPY;  Service: Gastroenterology;  Laterality: N/A;   ESOPHAGOGASTRODUODENOSCOPY N/A 03/16/2019   Procedure: ESOPHAGOGASTRODUODENOSCOPY (EGD);  Surgeon: Toledo, Benay Pike, MD;  Location: [facility] ENDOSCOPY;  Service: Gastroenterology;  Laterality: N/A;   IR  ANGIO INTRA EXTRACRAN SEL COM CAROTID INNOMINATE UNI L MOD SED  02/09/2021   IR INTRAVSC STENT CERV CAROTID W/EMB-PROT MOD SED INCL ANGIO  02/09/2021   IR PERCUTANEOUS ART THROMBECTOMY/INFUSION INTRACRANIAL INC DIAG ANGIO  02/09/2021   IR US GUIDE VASC  ACCESS RIGHT  02/09/2021   NO PAST SURGERIES     RADIOLOGY WITH ANESTHESIA N/A 02/09/2021   Procedure: IR WITH ANESTHESIA;  Surgeon: Radiologist, Medication, MD;  Location: [place];  Service: Radiology;  Laterality: N/A;    Social History   Socioeconomic History   Marital status: Divorced    Spouse name: Not on file   Number of children: Not on file   Years of education: Not on file   Highest education level: Not on file  Occupational History   Not on file  Tobacco Use   Smoking status: Every Day    Packs/day: 1.00    Types: Cigarettes   Smokeless tobacco: Never  Vaping Use   Vaping Use: Never used  Substance and Sexual Activity   Alcohol use: Yes    Comment: 21 Oz daily   Drug use: Not Currently    Types: Marijuana   Sexual activity: Not on file  Other Topics Concern   Not on file  Social History Narrative   Lives at home with mother and 2 dogs Charlie and Personal assistant    Drives   Social Determinants of Health   Financial Resource Strain: Not on file  Food Insecurity: No Food Insecurity   Worried About Charity fundraiser in the Last Year: Never true   Arboriculturist in the Last Year: Never true  Transportation Needs: No Transportation Needs   Lack of Transportation (Medical): No   Lack of Transportation (Non-Medical): No  Physical Activity: Not on file  Stress: No Stress Concern Present   Feeling of Stress : Only a little  Social Connections: Not on file  Intimate Partner Violence: Not At Risk   Fear of Current or Ex-Partner: No   Emotionally Abused: No   Physically Abused: No   Sexually Abused: No    Family History  Problem Relation Age of Onset   Hypertension Mother    Hyperlipidemia Mother    Heart disease Mother    COPD Mother    Hypertension Father    Hyperlipidemia Father    Cancer Father        prostate   Leukemia Paternal Uncle    Cancer Paternal Grandmother    Breast cancer Paternal Grandmother      Current Outpatient Medications:    aspirin  81 MG chewable tablet, Chew and swallow 1 tablet (81 mg total) by mouth daily., Disp: 30 tablet, Rfl: 2   atorvastatin (LIPITOR) 40 MG tablet, Take 1 tablet (40 mg total) by mouth daily., Disp: 30 tablet, Rfl: 2   folic acid (FOLVITE) 1 MG tablet, Take 2 tablets (2 mg total) by mouth daily., Disp: 30 tablet, Rfl: 0   gabapentin (NEURONTIN) 300 MG capsule, Take 1 capsule (300 mg total) by mouth 4 (four) times daily., Disp: 120 capsule, Rfl: 2   lisinopril (ZESTRIL) 10 MG tablet, Take 10 mg by mouth daily., Disp: , Rfl:    Multiple Vitamins-Minerals (CERTAVITE/ANTIOXIDANTS) TABS, Take 1 tablet by mouth daily., Disp: 30 tablet, Rfl: 2   omeprazole (PRILOSEC) 40 MG capsule, Take 1 capsule (40 mg total) by mouth daily., Disp: 90 capsule, Rfl: 0   thiamine 100 MG tablet, Take 1 tablet (100 mg total) by  mouth daily., Disp: 30 tablet, Rfl: 2   ticagrelor (BRILINTA) 90 MG TABS tablet, Take 1 tablet (90 mg total) by mouth 2 (two) times daily., Disp: 60 tablet, Rfl: 2   nicotine (NICODERM CQ - DOSED IN MG/24 HOURS) 14 mg/24hr patch, Place 1 patch (14 mg total) onto the skin daily as needed (hx of smoking). (Patient not taking: Reported on 03/30/2021), Disp: 28 patch, Rfl: 0  Physical exam:  Vitals:   03/30/21 1305  BP: (!) 158/98  Pulse: (!) 102  Resp: 16  Temp: 98.3 F (36.8 C)  SpO2: 98%  Weight: 146 lb 8 oz (66.5 kg)   Physical Exam Constitutional:      General: She is not in acute distress. Cardiovascular:     Rate and Rhythm: Normal rate and regular rhythm.     Heart sounds: Normal heart sounds.  Pulmonary:     Effort: Pulmonary effort is normal.  Skin:    General: Skin is warm and dry.  Neurological:     Mental Status: She is alert and oriented to person, place, and time.     CMP Latest Ref Rng & Units 02/11/2021  Glucose 70 - 99 mg/dL 93  BUN 6 - 20 mg/dL 9  Creatinine 0.44 - 1.00 mg/dL 0.76  Sodium 135 - 145 mmol/L 141  Potassium 3.5 - 5.1 mmol/L 3.8  Chloride 98 - 111 mmol/L  106  CO2 22 - 32 mmol/L 25  Calcium 8.9 - 10.3 mg/dL 8.5(L)  Total Protein 6.5 - 8.1 g/dL -  Total Bilirubin 0.3 - 1.2 mg/dL -  Alkaline Phos 38 - 126 U/L -  AST 15 - 41 U/L -  ALT 0 - 44 U/L -   CBC Latest Ref Rng & Units 03/24/2021  WBC 4.0 - 10.5 K/uL 9.9  Hemoglobin 12.0 - 15.0 g/dL 14.6  Hematocrit 36.0 - 46.0 % 44.1  Platelets 150 - 400 K/uL 178     Assessment and plan- Patient is a 45 y.o. female here for follow-up of following issues:  Polycythemia: This was noted on labs back in August 2022.  Comprehensive myeloid panel including JAK2 mutation testing was negative.  Shortly after that she presented with a stroke before she could get a bone marrow biopsy.  On the day of her stroke admission her hemoglobin had come down to 12.  Hemoglobin has not been higher than normal since then.  We will hold off on bone marrow biopsy at this time 2.  Iron B12 and folate deficiency: Likely secondary to alcoholism.  B12 levels are presently lowAt 244 and I have asked her to self administer B12 injections once a month.  She does have a prescription for the same at home.  Folate levels are normal but she will continue to take p.o. folate daily.  She is taking oral iron every other day and I will recheck all these values in about 3 months.   If there is evidence of polycythemia after correcting iron B12 and folate deficiency I will proceed with a bone marrow biopsy at that time   Visit Diagnosis 1. Polycythemia   2. Macrocytosis without anemia      Dr. Randa Evens, MD, MPH [facility] [phone] 03/30/2021 4:21 PM

## 2021-04-07 ENCOUNTER — Other Ambulatory Visit: Payer: Self-pay | Admitting: Oncology

## 2021-04-08 ENCOUNTER — Encounter: Payer: Self-pay | Admitting: Oncology

## 2021-04-14 ENCOUNTER — Telehealth (HOSPITAL_COMMUNITY): Payer: Self-pay | Admitting: Radiology

## 2021-04-14 NOTE — Telephone Encounter (Signed)
Returned pt's call. She wanted to check on when she is due for f/u with Dr. Debbrah Alar. Pt is due in December. We will contact her to schedule then. She agrees with this plan of care. JM

## 2021-04-29 ENCOUNTER — Encounter: Payer: Self-pay | Admitting: Oncology

## 2021-05-06 ENCOUNTER — Inpatient Hospital Stay: Payer: Self-pay | Admitting: Neurology

## 2021-05-20 ENCOUNTER — Encounter: Payer: Self-pay | Admitting: Oncology

## 2021-05-20 NOTE — Telephone Encounter (Signed)
Signing previous note, see note on 8/30

## 2021-05-22 ENCOUNTER — Other Ambulatory Visit: Payer: Self-pay

## 2021-05-22 NOTE — Patient Outreach (Signed)
[facility]) Care Management  05/22/2021  Joanna Garcia 1975-07-15 [id number]   Telephone outreach to patient to obtain mRS was successfully completed. MRS= 1   [place] Care Management Assistant

## 2021-06-24 ENCOUNTER — Other Ambulatory Visit: Payer: Self-pay | Admitting: *Deleted

## 2021-06-24 DIAGNOSIS — E611 Iron deficiency: Secondary | ICD-10-CM

## 2021-07-01 ENCOUNTER — Inpatient Hospital Stay (HOSPITAL_BASED_OUTPATIENT_CLINIC_OR_DEPARTMENT_OTHER): Payer: Medicaid Other | Admitting: Oncology

## 2021-07-01 ENCOUNTER — Encounter: Payer: Self-pay | Admitting: Oncology

## 2021-07-01 ENCOUNTER — Inpatient Hospital Stay: Payer: Medicaid Other | Attending: Oncology

## 2021-07-01 ENCOUNTER — Other Ambulatory Visit: Payer: Self-pay

## 2021-07-01 VITALS — BP 159/104 | HR 92 | Temp 98.7°F | Resp 16 | Ht 65.0 in | Wt 149.0 lb

## 2021-07-01 DIAGNOSIS — D7589 Other specified diseases of blood and blood-forming organs: Secondary | ICD-10-CM | POA: Insufficient documentation

## 2021-07-01 DIAGNOSIS — Z803 Family history of malignant neoplasm of breast: Secondary | ICD-10-CM | POA: Insufficient documentation

## 2021-07-01 DIAGNOSIS — E538 Deficiency of other specified B group vitamins: Secondary | ICD-10-CM | POA: Insufficient documentation

## 2021-07-01 DIAGNOSIS — E611 Iron deficiency: Secondary | ICD-10-CM

## 2021-07-01 DIAGNOSIS — D751 Secondary polycythemia: Secondary | ICD-10-CM | POA: Insufficient documentation

## 2021-07-01 DIAGNOSIS — Z8042 Family history of malignant neoplasm of prostate: Secondary | ICD-10-CM | POA: Insufficient documentation

## 2021-07-01 DIAGNOSIS — R5383 Other fatigue: Secondary | ICD-10-CM | POA: Insufficient documentation

## 2021-07-01 DIAGNOSIS — F17210 Nicotine dependence, cigarettes, uncomplicated: Secondary | ICD-10-CM | POA: Insufficient documentation

## 2021-07-01 DIAGNOSIS — Z806 Family history of leukemia: Secondary | ICD-10-CM | POA: Insufficient documentation

## 2021-07-01 DIAGNOSIS — I10 Essential (primary) hypertension: Secondary | ICD-10-CM | POA: Insufficient documentation

## 2021-07-01 LAB — VITAMIN B12: Vitamin B-12: 391 pg/mL (ref 180–914)

## 2021-07-01 LAB — CBC
HCT: 45 % (ref 36.0–46.0)
Hemoglobin: 14.5 g/dL (ref 12.0–15.0)
MCH: 30.7 pg (ref 26.0–34.0)
MCHC: 32.2 g/dL (ref 30.0–36.0)
MCV: 95.1 fL (ref 80.0–100.0)
Platelets: 193 10*3/uL (ref 150–400)
RBC: 4.73 MIL/uL (ref 3.87–5.11)
RDW: 13.5 % (ref 11.5–15.5)
WBC: 10 10*3/uL (ref 4.0–10.5)
nRBC: 0 % (ref 0.0–0.2)

## 2021-07-01 LAB — IRON AND TIBC
Iron: 72 ug/dL (ref 28–170)
Saturation Ratios: 14 % (ref 10.4–31.8)
TIBC: 528 ug/dL — ABNORMAL HIGH (ref 250–450)
UIBC: 456 ug/dL

## 2021-07-01 LAB — FOLATE: Folate: 7.9 ng/mL (ref >5.9)

## 2021-07-01 LAB — FERRITIN: Ferritin: 9 ng/mL — ABNORMAL LOW (ref 11–307)

## 2021-07-01 NOTE — Progress Notes (Signed)
Hematology/Oncology Consult note [facility]  Telephone:([phone] [phone]  Patient Care Team: Letta Median, MD as PCP - General (Family Medicine)   Name of the patient: Joanna Garcia  [id number]  [birth year]   Date of visit: 07/01/21  Diagnosis- 1.  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  History of iron deficiency 3.  History of polycythemia  Chief complaint/ Reason for visit-routine follow-up of above issues  Heme/Onc history: patient is a 46 year old female who was evaluated by NP Rulon Abide for polycythemia.Patient has had a macrocytic anemia upon 09/07/2020 and her MCV has been between 10 2-1 27.  Most recently on 01/09/2021 she was noted to have a white count of 16.2, H&H of 19.5/55.7 with an MCV of 106 and a platelet count of 217.  JAK2 mutation testing x1 was inconclusive and the second JAK2 mutation testing was negative.  Patient has had some history of folate deficiency back in 2020 along with severe B12 deficiency.Her hemoglobin improved significantly since then but macrocytosis has persisted.   She is an everyday smoker but does not report any trouble sleeping.  She has some ongoing fatigue.  No history of chronic lung disease.Urinalysis did not show any evidence of hematuria.  T4 level was low normal at 4.9   Plan was to proceed with bone marrow biopsy to a certain if polycythemia is primary or secondary.  2 sessions of phlebotomy were attempted for polycythemia but 500 cc could not be taken out as blood appeared to be "too thick" per infusion.   Before patient could get a bone marrow biopsy she was admitted to [facility] with symptoms of acute stroke and was discharged on Brilinta and aspirin    Interval history-patient is doing well overall.  Appetite and weight have remained stable.  She continues to smoke as well as drink alcohol regularly however.  States that multiple family members  including her mother have tested positive for hemochromatosis in the past.  ECOG PS- 1 Pain scale- 0   Review of systems- Review of Systems  Constitutional:  Positive for malaise/fatigue. Negative for chills, fever and weight loss.  HENT:  Negative for congestion, ear discharge and nosebleeds.   Eyes:  Negative for blurred vision.  Respiratory:  Negative for cough, hemoptysis, sputum production, shortness of breath and wheezing.   Cardiovascular:  Negative for chest pain, palpitations, orthopnea and claudication.  Gastrointestinal:  Negative for abdominal pain, blood in stool, constipation, diarrhea, heartburn, melena, nausea and vomiting.  Genitourinary:  Negative for dysuria, flank pain, frequency, hematuria and urgency.  Musculoskeletal:  Negative for back pain, joint pain and myalgias.  Skin:  Negative for rash.  Neurological:  Negative for dizziness, tingling, focal weakness, seizures, weakness and headaches.  Endo/Heme/Allergies:  Does not bruise/bleed easily.  Psychiatric/Behavioral:  Negative for depression and suicidal ideas. The patient does not have insomnia.       No Known Allergies   Past Medical History:  Diagnosis Date   DVT (deep venous thrombosis) (HCC)    GERD (gastroesophageal reflux disease)    Hypertension    Polycythemia    Polyneuropathy    Stroke [facility])      Past Surgical History:  Procedure Laterality Date   COLONOSCOPY N/A 03/16/2019   Procedure: COLONOSCOPY;  Surgeon: Toledo, Benay Pike, MD;  Location: [facility] ENDOSCOPY;  Service: Gastroenterology;  Laterality: N/A;   ESOPHAGOGASTRODUODENOSCOPY N/A 03/16/2019   Procedure: ESOPHAGOGASTRODUODENOSCOPY (EGD);  Surgeon: Alice Reichert, Benay Pike, MD;  Location: [facility] ENDOSCOPY;  Service: Gastroenterology;  Laterality: N/A;   IR ANGIO INTRA EXTRACRAN SEL COM CAROTID INNOMINATE UNI L MOD SED  02/09/2021   IR INTRAVSC STENT CERV CAROTID W/EMB-PROT MOD SED INCL ANGIO  02/09/2021   IR PERCUTANEOUS ART THROMBECTOMY/INFUSION  INTRACRANIAL INC DIAG ANGIO  02/09/2021   IR US GUIDE VASC ACCESS RIGHT  02/09/2021   NO PAST SURGERIES     RADIOLOGY WITH ANESTHESIA N/A 02/09/2021   Procedure: IR WITH ANESTHESIA;  Surgeon: Radiologist, Medication, MD;  Location: [place];  Service: Radiology;  Laterality: N/A;    Social History   Socioeconomic History   Marital status: Divorced    Spouse name: Not on file   Number of children: Not on file   Years of education: Not on file   Highest education level: Not on file  Occupational History   Not on file  Tobacco Use   Smoking status: Every Day    Packs/day: 0.75    Types: Cigarettes   Smokeless tobacco: Never  Vaping Use   Vaping Use: Never used  Substance and Sexual Activity   Alcohol use: Yes    Comment: 24 Oz daily   Drug use: Not Currently    Types: Marijuana   Sexual activity: Not Currently  Other Topics Concern   Not on file  Social History Narrative   Lives at home with mother and 2 dogs Charlie and Personal assistant    Drives   Social Determinants of Health   Financial Resource Strain: Not on file  Food Insecurity: No Food Insecurity   Worried About Charity fundraiser in the Last Year: Never true   Arboriculturist in the Last Year: Never true  Transportation Needs: No Transportation Needs   Lack of Transportation (Medical): No   Lack of Transportation (Non-Medical): No  Physical Activity: Not on file  Stress: No Stress Concern Present   Feeling of Stress : Only a little  Social Connections: Not on file  Intimate Partner Violence: Not At Risk   Fear of Current or Ex-Partner: No   Emotionally Abused: No   Physically Abused: No   Sexually Abused: No    Family History  Problem Relation Age of Onset   Hypertension Mother    Hyperlipidemia Mother    Heart disease Mother    COPD Mother    Hemochromatosis Mother    Hypertension Father    Hyperlipidemia Father    Cancer Father        prostate   Hemochromatosis Maternal Aunt    Hemochromatosis Maternal  Aunt    Hemochromatosis Maternal Uncle    Leukemia Paternal Uncle    Cancer Paternal Grandmother    Breast cancer Paternal Grandmother      Current Outpatient Medications:    aspirin 81 MG chewable tablet, Chew and swallow 1 tablet (81 mg total) by mouth daily., Disp: 30 tablet, Rfl: 2   atorvastatin (LIPITOR) 40 MG tablet, Take 1 tablet (40 mg total) by mouth daily., Disp: 30 tablet, Rfl: 2   folic acid (FOLVITE) 1 MG tablet, TAKE 2 TABLETS BY MOUTH DAILY., Disp: 30 tablet, Rfl: 0   gabapentin (NEURONTIN) 300 MG capsule, Take 1 capsule (300 mg total) by mouth 4 (four) times daily., Disp: 120 capsule, Rfl: 2   lisinopril (ZESTRIL) 5 MG tablet, Take 5 mg by mouth daily., Disp: , Rfl:    Multiple Vitamins-Minerals (CERTAVITE/ANTIOXIDANTS) TABS, Take 1 tablet by mouth daily., Disp: 30 tablet, Rfl: 2   omeprazole (  PRILOSEC) 40 MG capsule, Take 1 capsule (40 mg total) by mouth daily., Disp: 90 capsule, Rfl: 0   thiamine 100 MG tablet, Take 1 tablet (100 mg total) by mouth daily., Disp: 30 tablet, Rfl: 2   ticagrelor (BRILINTA) 90 MG TABS tablet, Take 1 tablet (90 mg total) by mouth 2 (two) times daily., Disp: 60 tablet, Rfl: 2  Physical exam:  Vitals:   07/01/21 1132  BP: (!) 159/104  Pulse: 92  Resp: 16  Temp: 98.7 F (37.1 C)  TempSrc: Oral  SpO2: 96%  Weight: 149 lb (67.6 kg)  Height: 5' 5"  (1.651 m)   Physical Exam Constitutional:      General: She is not in acute distress. Cardiovascular:     Rate and Rhythm: Normal rate and regular rhythm.     Heart sounds: Normal heart sounds.  Pulmonary:     Effort: Pulmonary effort is normal.     Breath sounds: Normal breath sounds.  Abdominal:     General: Bowel sounds are normal.     Palpations: Abdomen is soft.  Skin:    General: Skin is warm and dry.  Neurological:     Mental Status: She is alert and oriented to person, place, and time.     CMP Latest Ref Rng & Units 02/11/2021  Glucose 70 - 99 mg/dL 93  BUN 6 - 20 mg/dL 9   Creatinine 0.44 - 1.00 mg/dL 0.76  Sodium 135 - 145 mmol/L 141  Potassium 3.5 - 5.1 mmol/L 3.8  Chloride 98 - 111 mmol/L 106  CO2 22 - 32 mmol/L 25  Calcium 8.9 - 10.3 mg/dL 8.5(L)  Total Protein 6.5 - 8.1 g/dL -  Total Bilirubin 0.3 - 1.2 mg/dL -  Alkaline Phos 38 - 126 U/L -  AST 15 - 41 U/L -  ALT 0 - 44 U/L -   CBC Latest Ref Rng & Units 07/01/2021  WBC 4.0 - 10.5 K/uL 10.0  Hemoglobin 12.0 - 15.0 g/dL 14.5  Hematocrit 36.0 - 46.0 % 45.0  Platelets 150 - 400 K/uL 193     Assessment and plan- Patient is a 46 y.o. female who is here for follow-up of following issues:  Polycythemia: This was seen transiently about 6 months ago and since then has resolved.  JAK2 mutation testing has been negative.  Continue to monitor History of B12 deficiency: She is currently on B12 injections levels from today are pending. History of folate deficiency: Levels have normalized and patient will continue to take oral folate. History of iron deficiency: Labs continue to show evidence of iron deficiency with a ferritin of 9And iron saturation of 14%.  TIBC is also elevated at 528 suggestive of iron deficiency.  I have asked her to take oral iron once a day if she can tolerate it.  Family history of hemochromatosis: We will check HFE gene testing at her next labs in 3 months.  Labs in 3 in 6 months.  She will see me in 6 months   Visit Diagnosis 1. Polycythemia   2. Folate deficiency   3. B12 deficiency      Dr. Randa Evens, MD, MPH [facility] [phone] 07/01/2021 4:55 PM

## 2021-07-05 NOTE — Addendum Note (Signed)
Addended by: Luella Cook on: 07/05/2021 03:05 PM   Modules accepted: Orders

## 2021-09-29 ENCOUNTER — Inpatient Hospital Stay: Payer: Self-pay | Attending: Oncology

## 2021-09-29 ENCOUNTER — Other Ambulatory Visit: Payer: Self-pay

## 2021-09-29 DIAGNOSIS — E611 Iron deficiency: Secondary | ICD-10-CM

## 2021-09-29 DIAGNOSIS — D751 Secondary polycythemia: Secondary | ICD-10-CM

## 2021-09-29 DIAGNOSIS — E538 Deficiency of other specified B group vitamins: Secondary | ICD-10-CM | POA: Insufficient documentation

## 2021-09-29 DIAGNOSIS — D571 Sickle-cell disease without crisis: Secondary | ICD-10-CM | POA: Insufficient documentation

## 2021-09-29 LAB — IRON AND TIBC
Iron: 50 ug/dL (ref 28–170)
Saturation Ratios: 10 % — ABNORMAL LOW (ref 10.4–31.8)
TIBC: 519 ug/dL — ABNORMAL HIGH (ref 250–450)
UIBC: 469 ug/dL

## 2021-09-29 LAB — CBC
HCT: 44.4 % (ref 36.0–46.0)
Hemoglobin: 14.1 g/dL (ref 12.0–15.0)
MCH: 30.6 pg (ref 26.0–34.0)
MCHC: 31.8 g/dL (ref 30.0–36.0)
MCV: 96.3 fL (ref 80.0–100.0)
Platelets: 163 10*3/uL (ref 150–400)
RBC: 4.61 MIL/uL (ref 3.87–5.11)
RDW: 14 % (ref 11.5–15.5)
WBC: 11 10*3/uL — ABNORMAL HIGH (ref 4.0–10.5)
nRBC: 0 % (ref 0.0–0.2)

## 2021-09-29 LAB — FERRITIN: Ferritin: 5 ng/mL — ABNORMAL LOW (ref 11–307)

## 2021-09-29 LAB — VITAMIN B12: Vitamin B-12: 315 pg/mL (ref 180–914)

## 2021-10-05 LAB — HEMOCHROMATOSIS DNA-PCR(C282Y,H63D)

## 2021-11-11 ENCOUNTER — Encounter: Payer: Self-pay | Admitting: Oncology

## 2021-12-30 ENCOUNTER — Inpatient Hospital Stay (HOSPITAL_BASED_OUTPATIENT_CLINIC_OR_DEPARTMENT_OTHER): Payer: Self-pay | Admitting: Oncology

## 2021-12-30 ENCOUNTER — Encounter: Payer: Self-pay | Admitting: Oncology

## 2021-12-30 ENCOUNTER — Inpatient Hospital Stay: Payer: Medicaid Other | Attending: Oncology

## 2021-12-30 VITALS — BP 146/82 | HR 85 | Temp 97.9°F | Resp 16 | Wt 147.8 lb

## 2021-12-30 DIAGNOSIS — E611 Iron deficiency: Secondary | ICD-10-CM

## 2021-12-30 DIAGNOSIS — Z7982 Long term (current) use of aspirin: Secondary | ICD-10-CM | POA: Insufficient documentation

## 2021-12-30 DIAGNOSIS — E538 Deficiency of other specified B group vitamins: Secondary | ICD-10-CM

## 2021-12-30 DIAGNOSIS — F17210 Nicotine dependence, cigarettes, uncomplicated: Secondary | ICD-10-CM | POA: Insufficient documentation

## 2021-12-30 DIAGNOSIS — Z862 Personal history of diseases of the blood and blood-forming organs and certain disorders involving the immune mechanism: Secondary | ICD-10-CM

## 2021-12-30 DIAGNOSIS — Z79899 Other long term (current) drug therapy: Secondary | ICD-10-CM | POA: Insufficient documentation

## 2021-12-30 DIAGNOSIS — D751 Secondary polycythemia: Secondary | ICD-10-CM

## 2021-12-30 LAB — VITAMIN B12: Vitamin B-12: 374 pg/mL (ref 180–914)

## 2021-12-30 LAB — CBC
HCT: 42.4 % (ref 36.0–46.0)
Hemoglobin: 13.3 g/dL (ref 12.0–15.0)
MCH: 30 pg (ref 26.0–34.0)
MCHC: 31.4 g/dL (ref 30.0–36.0)
MCV: 95.7 fL (ref 80.0–100.0)
Platelets: 168 10*3/uL (ref 150–400)
RBC: 4.43 MIL/uL (ref 3.87–5.11)
RDW: 14.3 % (ref 11.5–15.5)
WBC: 10.4 10*3/uL (ref 4.0–10.5)
nRBC: 0 % (ref 0.0–0.2)

## 2021-12-30 LAB — IRON AND TIBC
Iron: 39 ug/dL (ref 28–170)
Saturation Ratios: 7 % — ABNORMAL LOW (ref 10.4–31.8)
TIBC: 570 ug/dL — ABNORMAL HIGH (ref 250–450)
UIBC: 531 ug/dL

## 2021-12-30 LAB — FERRITIN: Ferritin: 6 ng/mL — ABNORMAL LOW (ref 11–307)

## 2021-12-30 NOTE — Progress Notes (Signed)
Hematology/Oncology Consult note [facility]  [phone] [phone]  Patient Care Team: Letta Median, MD as PCP - General (Family Medicine)   Name of the patient: Joanna Garcia  [id number]  1976-06-03   Date of visit: 12/30/21  Diagnosis- 1.  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  History of iron deficiency 3.  History of polycythemia    Chief complaint/ Reason for visit-routine follow-up of above issues  Heme/Onc history:  patient is a 46 year old female who was evaluated by NP Rulon Abide for polycythemia.Patient has had a macrocytic anemia upon 09/07/2020 and her MCV has been between 10 2-1 27.  Most recently on 01/09/2021 she was noted to have a white count of 16.2, H&H of 19.5/55.7 with an MCV of 106 and a platelet count of 217.  JAK2 mutation testing x1 was inconclusive and the second JAK2 mutation testing was negative.  Patient has had some history of folate deficiency back in 2020 along with severe B12 deficiency.Her hemoglobin improved significantly since then but macrocytosis has persisted.   She is an everyday smoker but does not report any trouble sleeping.  She has some ongoing fatigue.  No history of chronic lung disease.Urinalysis did not show any evidence of hematuria.  T4 level was low normal at 4.9   Plan was to proceed with bone marrow biopsy to a certain if polycythemia is primary or secondary.  2 sessions of phlebotomy were attempted for polycythemia but 500 cc could not be taken out as blood appeared to be "too thick" per infusion.   Before patient could get a bone marrow biopsy she was admitted to [facility] with symptoms of acute stroke and was discharged on Brilinta and aspirin    Interval history-she has heavy menstrual cycles.  Reports ongoing fatigue  ECOG PS- 1 Pain scale- 0   Review of systems- Review of Systems  Constitutional:  Positive for  malaise/fatigue. Negative for chills, fever and weight loss.  HENT:  Negative for congestion, ear discharge and nosebleeds.   Eyes:  Negative for blurred vision.  Respiratory:  Negative for cough, hemoptysis, sputum production, shortness of breath and wheezing.   Cardiovascular:  Negative for chest pain, palpitations, orthopnea and claudication.  Gastrointestinal:  Negative for abdominal pain, blood in stool, constipation, diarrhea, heartburn, melena, nausea and vomiting.  Genitourinary:  Negative for dysuria, flank pain, frequency, hematuria and urgency.  Musculoskeletal:  Negative for back pain, joint pain and myalgias.  Skin:  Negative for rash.  Neurological:  Negative for dizziness, tingling, focal weakness, seizures, weakness and headaches.  Endo/Heme/Allergies:  Does not bruise/bleed easily.  Psychiatric/Behavioral:  Negative for depression and suicidal ideas. The patient does not have insomnia.       No Known Allergies   Past Medical History:  Diagnosis Date   DVT (deep venous thrombosis) (HCC)    GERD (gastroesophageal reflux disease)    Hypertension    Polycythemia    Polyneuropathy    Stroke [facility])      Past Surgical History:  Procedure Laterality Date   COLONOSCOPY N/A 03/16/2019   Procedure: COLONOSCOPY;  Surgeon: Toledo, Benay Pike, MD;  Location: [facility] ENDOSCOPY;  Service: Gastroenterology;  Laterality: N/A;   ESOPHAGOGASTRODUODENOSCOPY N/A 03/16/2019   Procedure: ESOPHAGOGASTRODUODENOSCOPY (EGD);  Surgeon: Toledo, Benay Pike, MD;  Location: [facility] ENDOSCOPY;  Service: Gastroenterology;  Laterality: N/A;   IR ANGIO INTRA EXTRACRAN SEL COM CAROTID INNOMINATE UNI L MOD SED  02/09/2021   IR INTRAVSC  STENT CERV CAROTID W/EMB-PROT MOD SED INCL ANGIO  02/09/2021   IR PERCUTANEOUS ART THROMBECTOMY/INFUSION INTRACRANIAL INC DIAG ANGIO  02/09/2021   IR US GUIDE VASC ACCESS RIGHT  02/09/2021   NO PAST SURGERIES     RADIOLOGY WITH ANESTHESIA N/A 02/09/2021   Procedure: IR WITH  ANESTHESIA;  Surgeon: Radiologist, Medication, MD;  Location: [place];  Service: Radiology;  Laterality: N/A;    Social History   Socioeconomic History   Marital status: Divorced    Spouse name: Not on file   Number of children: Not on file   Years of education: Not on file   Highest education level: Not on file  Occupational History   Not on file  Tobacco Use   Smoking status: Every Day    Packs/day: 0.75    Types: Cigarettes   Smokeless tobacco: Never  Vaping Use   Vaping Use: Never used  Substance and Sexual Activity   Alcohol use: Yes    Comment: 24 Oz daily   Drug use: Not Currently    Types: Marijuana   Sexual activity: Not Currently  Other Topics Concern   Not on file  Social History Narrative   Lives at home with mother and 2 dogs Charlie and Personal assistant    Drives   Social Determinants of Health   Financial Resource Strain: Not on file  Food Insecurity: No Food Insecurity (02/20/2021)   Hunger Vital Sign    Worried About Running Out of Food in the Last Year: Never true    [place] in the Last Year: Never true  Transportation Needs: No Transportation Needs (02/20/2021)   PRAPARE - Hydrologist (Medical): No    Lack of Transportation (Non-Medical): No  Physical Activity: Not on file  Stress: No Stress Concern Present (02/20/2021)   [place]    Feeling of Stress : Only a little  Social Connections: Not on file  Intimate Partner Violence: Not At Risk (02/20/2021)   Humiliation, Afraid, Rape, and Kick questionnaire    Fear of Current or Ex-Partner: No    Emotionally Abused: No    Physically Abused: No    Sexually Abused: No    Family History  Problem Relation Age of Onset   Hypertension Mother    Hyperlipidemia Mother    Heart disease Mother    COPD Mother    Hemochromatosis Mother    Hypertension Father    Hyperlipidemia Father    Cancer Father        prostate    Hemochromatosis Maternal Aunt    Hemochromatosis Maternal Aunt    Hemochromatosis Maternal Uncle    Leukemia Paternal Uncle    Cancer Paternal Grandmother    Breast cancer Paternal Grandmother      Current Outpatient Medications:    aspirin 81 MG chewable tablet, Chew and swallow 1 tablet (81 mg total) by mouth daily., Disp: 30 tablet, Rfl: 2   atorvastatin (LIPITOR) 40 MG tablet, Take 1 tablet (40 mg total) by mouth daily., Disp: 30 tablet, Rfl: 2   cyanocobalamin (,VITAMIN B-12,) 1000 MCG/ML injection, Inject into the muscle., Disp: , Rfl:    folic acid (FOLVITE) 1 MG tablet, TAKE 2 TABLETS BY MOUTH DAILY., Disp: 30 tablet, Rfl: 0   gabapentin (NEURONTIN) 300 MG capsule, Take 1 capsule (300 mg total) by mouth 4 (four) times daily., Disp: 120 capsule, Rfl: 2   lisinopril (ZESTRIL) 5 MG tablet, Take  5 mg by mouth daily., Disp: , Rfl:    Multiple Vitamins-Minerals (CERTAVITE/ANTIOXIDANTS) TABS, Take 1 tablet by mouth daily., Disp: 30 tablet, Rfl: 2   omeprazole (PRILOSEC) 40 MG capsule, Take 1 capsule (40 mg total) by mouth daily., Disp: 90 capsule, Rfl: 0   thiamine 100 MG tablet, Take 1 tablet (100 mg total) by mouth daily., Disp: 30 tablet, Rfl: 2   ticagrelor (BRILINTA) 90 MG TABS tablet, Take 1 tablet (90 mg total) by mouth 2 (two) times daily., Disp: 60 tablet, Rfl: 2  Physical exam:  Vitals:   12/30/21 1109  BP: (!) 146/82  Pulse: 85  Resp: 16  Temp: 97.9 F (36.6 C)  SpO2: 99%  Weight: 147 lb 12.8 oz (67 kg)   Physical Exam Cardiovascular:     Rate and Rhythm: Normal rate and regular rhythm.     Heart sounds: Normal heart sounds.  Pulmonary:     Effort: Pulmonary effort is normal.  Skin:    General: Skin is warm and dry.  Neurological:     Mental Status: She is alert and oriented to person, place, and time.         Latest Ref Rng & Units 02/11/2021    4:36 AM  CMP  Glucose 70 - 99 mg/dL 93   BUN 6 - 20 mg/dL 9   Creatinine 0.44 - 1.00 mg/dL 0.76   Sodium  135 - 145 mmol/L 141   Potassium 3.5 - 5.1 mmol/L 3.8   Chloride 98 - 111 mmol/L 106   CO2 22 - 32 mmol/L 25   Calcium 8.9 - 10.3 mg/dL 8.5       Latest Ref Rng & Units 12/30/2021   10:53 AM  CBC  WBC 4.0 - 10.5 K/uL 10.4   Hemoglobin 12.0 - 15.0 g/dL 13.3   Hematocrit 36.0 - 46.0 % 42.4   Platelets 150 - 400 K/uL 168      Assessment and plan- Patient is a 46 y.o. female is here for follow-up of following issues:  History of polycythemia: This was transient and about 1 year ago prior to her stroke.  She has not had any polycythemia since then.  Hemoglobin is presently normal History of B12 deficiency: On monthly B12 injections which she will continue History of iron deficiency: Patient is not presently anemic but her ferritin levels have been chronically low.  She is unable to tolerate oral iron.  If her ferritin comes back less than 30 I will plan to give her IV iron.  For now I will see her back in 6 months with CBC ferritin and iron studies and B12   Visit Diagnosis 1. Iron deficiency   2. History of polycythemia   3. B12 deficiency      Dr. Randa Evens, MD, MPH [facility] [phone] 12/30/2021 11:53 AM

## 2022-01-11 NOTE — Progress Notes (Signed)
..  Patient is receiving Assistance Medication - Supplied Externally. Medication: MonoFerric Manufacture: [employer] Approval Dates: Approved from 01/11/2022 until 01/12/2023. *one dose only, new enrollment required for additional doses* ID: [id number] Reason: Self Pay

## 2022-01-21 ENCOUNTER — Other Ambulatory Visit: Payer: Self-pay | Admitting: Oncology

## 2022-01-21 ENCOUNTER — Inpatient Hospital Stay: Payer: Medicaid Other | Attending: Oncology

## 2022-01-21 VITALS — BP 136/67 | HR 88 | Temp 98.0°F

## 2022-01-21 DIAGNOSIS — E538 Deficiency of other specified B group vitamins: Secondary | ICD-10-CM | POA: Insufficient documentation

## 2022-01-21 DIAGNOSIS — D751 Secondary polycythemia: Secondary | ICD-10-CM | POA: Insufficient documentation

## 2022-01-21 DIAGNOSIS — E611 Iron deficiency: Secondary | ICD-10-CM | POA: Insufficient documentation

## 2022-01-21 MED ORDER — SODIUM CHLORIDE 0.9 % IV SOLN
Freq: Once | INTRAVENOUS | Status: AC
  Filled 2022-01-21: qty 250

## 2022-01-21 MED ORDER — SODIUM CHLORIDE 0.9 % IV SOLN
1000.0000 mg | Freq: Once | INTRAVENOUS | Status: AC
  Administered 2022-01-21: 1000 mg via INTRAVENOUS
  Filled 2022-01-21: qty 10

## 2022-01-21 NOTE — Patient Instructions (Signed)

## 2022-01-21 NOTE — Progress Notes (Signed)
Pt tolerated Monoferric infusion well with no signs of complications. Pt observed for 30 minutes per ordered, no complications occurred at this time. RN educated pt on the importance of notifying the clinic if any complications occur at home and when to seek emergency care. Pt verbalized understanding and all questions answered at this time. VSS. Pt stable at discharge.  Imanol Bihl [employer]

## 2022-06-28 ENCOUNTER — Encounter: Payer: Self-pay | Admitting: Oncology

## 2022-07-05 ENCOUNTER — Other Ambulatory Visit: Payer: Medicaid Other

## 2022-07-05 ENCOUNTER — Ambulatory Visit: Payer: Medicaid Other | Admitting: Nurse Practitioner

## 2022-07-05 ENCOUNTER — Telehealth: Payer: Self-pay | Admitting: Oncology

## 2022-07-05 ENCOUNTER — Inpatient Hospital Stay: Payer: Medicaid Other

## 2022-07-05 ENCOUNTER — Encounter: Payer: Self-pay | Admitting: Nurse Practitioner

## 2022-07-05 ENCOUNTER — Inpatient Hospital Stay: Payer: Medicaid Other | Attending: Oncology | Admitting: Nurse Practitioner

## 2022-07-05 VITALS — BP 178/89 | HR 81 | Temp 97.3°F | Wt 138.0 lb

## 2022-07-05 DIAGNOSIS — E538 Deficiency of other specified B group vitamins: Secondary | ICD-10-CM | POA: Insufficient documentation

## 2022-07-05 DIAGNOSIS — D751 Secondary polycythemia: Secondary | ICD-10-CM | POA: Diagnosis not present

## 2022-07-05 DIAGNOSIS — Z8673 Personal history of transient ischemic attack (TIA), and cerebral infarction without residual deficits: Secondary | ICD-10-CM | POA: Diagnosis not present

## 2022-07-05 DIAGNOSIS — D7589 Other specified diseases of blood and blood-forming organs: Secondary | ICD-10-CM | POA: Diagnosis not present

## 2022-07-05 DIAGNOSIS — I10 Essential (primary) hypertension: Secondary | ICD-10-CM | POA: Diagnosis not present

## 2022-07-05 DIAGNOSIS — Z803 Family history of malignant neoplasm of breast: Secondary | ICD-10-CM | POA: Diagnosis not present

## 2022-07-05 DIAGNOSIS — F17210 Nicotine dependence, cigarettes, uncomplicated: Secondary | ICD-10-CM | POA: Insufficient documentation

## 2022-07-05 DIAGNOSIS — N939 Abnormal uterine and vaginal bleeding, unspecified: Secondary | ICD-10-CM | POA: Diagnosis not present

## 2022-07-05 DIAGNOSIS — E611 Iron deficiency: Secondary | ICD-10-CM | POA: Diagnosis not present

## 2022-07-05 DIAGNOSIS — Z86718 Personal history of other venous thrombosis and embolism: Secondary | ICD-10-CM | POA: Diagnosis not present

## 2022-07-05 DIAGNOSIS — Z8042 Family history of malignant neoplasm of prostate: Secondary | ICD-10-CM | POA: Diagnosis not present

## 2022-07-05 LAB — CBC WITH DIFFERENTIAL/PLATELET
Abs Immature Granulocytes: 0.05 10*3/uL (ref 0.00–0.07)
Basophils Absolute: 0.1 10*3/uL (ref 0.0–0.1)
Basophils Relative: 1 %
Eosinophils Absolute: 0.2 10*3/uL (ref 0.0–0.5)
Eosinophils Relative: 2 %
HCT: 44.9 % (ref 36.0–46.0)
Hemoglobin: 14.8 g/dL (ref 12.0–15.0)
Immature Granulocytes: 0 %
Lymphocytes Relative: 17 %
Lymphs Abs: 2 10*3/uL (ref 0.7–4.0)
MCH: 33.5 pg (ref 26.0–34.0)
MCHC: 33 g/dL (ref 30.0–36.0)
MCV: 101.6 fL — ABNORMAL HIGH (ref 80.0–100.0)
Monocytes Absolute: 0.8 10*3/uL (ref 0.1–1.0)
Monocytes Relative: 7 %
Neutro Abs: 8.7 10*3/uL — ABNORMAL HIGH (ref 1.7–7.7)
Neutrophils Relative %: 73 %
Platelets: 159 10*3/uL (ref 150–400)
RBC: 4.42 MIL/uL (ref 3.87–5.11)
RDW: 12.1 % (ref 11.5–15.5)
WBC: 11.8 10*3/uL — ABNORMAL HIGH (ref 4.0–10.5)
nRBC: 0 % (ref 0.0–0.2)

## 2022-07-05 LAB — IRON AND TIBC
Iron: 97 ug/dL (ref 28–170)
Saturation Ratios: 24 % (ref 10.4–31.8)
TIBC: 405 ug/dL (ref 250–450)
UIBC: 308 ug/dL

## 2022-07-05 LAB — FERRITIN: Ferritin: 27 ng/mL (ref 11–307)

## 2022-07-05 LAB — VITAMIN B12: Vitamin B-12: 302 pg/mL (ref 180–914)

## 2022-07-05 NOTE — Telephone Encounter (Signed)
VM left with patient. Per NP, she needs Venofer x 4. Requested she call back to set up.

## 2022-07-05 NOTE — Progress Notes (Addendum)
Hematology/Oncology Consult Note [facility]  [phone] [phone]  Patient Care Team: Letta Median, MD as PCP - General (Family Medicine)   Name of the patient: Joanna Garcia  [id number]  [birth year]   Date of visit: 07/05/22  Diagnosis- 1.  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  History of iron deficiency 3.  History of polycythemia    Chief complaint/ Reason for visit- Iron deficiency   Heme/Onc history:  Patient is a 47 year old female who was evaluated by NP Rulon Abide for polycythemia. Patient has had a macrocytic anemia upon 09/07/2020 and her MCV has been between 10 2-1 27.  Most recently on 01/09/2021 she was noted to have a white count of 16.2, H&H of 19.5/55.7 with an MCV of 106 and a platelet count of 217.  JAK2 mutation testing x1 was inconclusive and the second JAK2 mutation testing was negative.  Patient has had some history of folate deficiency back in 2020 along with severe B12 deficiency.Her hemoglobin improved significantly since then but macrocytosis has persisted.   She is an everyday smoker but does not report any trouble sleeping.  She has some ongoing fatigue.  No history of chronic lung disease. Urinalysis did not show any evidence of hematuria.  T4 level was low normal at 4.9   Plan was to proceed with bone marrow biopsy to a certain if polycythemia is primary or secondary.  2 sessions of phlebotomy were attempted for polycythemia but 500 cc could not be taken out as blood appeared to be "too thick" per infusion.   Before patient could get a bone marrow biopsy she was admitted to [facility] with symptoms of acute stroke and was discharged on Brilinta and aspirin    Interval history- Patient is 47 year old female with above history of macrocytosis, iron deficiency, and polycythemia who returns to clinic for repeat labs and follow up. She reports ongoing fatigue. Has very  heavy menstrual periods lasting 10 days and at times changes super tampon every 2-3 hours. Has not seen gynecology. Continues blood thinner for hx of stroke. Denies any neurologic complaints. Denies recent fevers or illnesses. Endorses easy bleeding or bruising. No melena or hematochezia. No pica or restless leg. Reports good appetite and denies weight loss. Denies chest pain. Denies any nausea, vomiting, constipation, or diarrhea. Denies urinary complaints. Patient offers no further specific complaints today.   ECOG PS- 1 Pain scale- 0   Review of systems- Review of Systems  Constitutional:  Positive for malaise/fatigue. Negative for chills, fever and weight loss.  HENT:  Negative for congestion, ear discharge and nosebleeds.   Eyes:  Negative for blurred vision.  Respiratory:  Negative for cough, hemoptysis, sputum production, shortness of breath and wheezing.   Cardiovascular:  Negative for chest pain, palpitations, orthopnea and claudication.  Gastrointestinal:  Negative for abdominal pain, blood in stool, constipation, diarrhea, heartburn, melena, nausea and vomiting.  Genitourinary:  Negative for dysuria, flank pain, frequency, hematuria and urgency.  Musculoskeletal:  Negative for back pain, joint pain and myalgias.  Skin:  Negative for rash.  Neurological:  Negative for dizziness, tingling, focal weakness, seizures, weakness and headaches.  Endo/Heme/Allergies:  Bruises/bleeds easily.  Psychiatric/Behavioral:  Positive for substance abuse. Negative for depression and suicidal ideas. The patient does not have insomnia.      No Known Allergies   Past Medical History:  Diagnosis Date   DVT (deep venous thrombosis) (HCC)    GERD (gastroesophageal reflux disease)  Hypertension    Polycythemia    Polyneuropathy    Stroke [facility])      Past Surgical History:  Procedure Laterality Date   COLONOSCOPY N/A 03/16/2019   Procedure: COLONOSCOPY;  Surgeon: Toledo, Benay Pike, MD;   Location: [facility] ENDOSCOPY;  Service: Gastroenterology;  Laterality: N/A;   ESOPHAGOGASTRODUODENOSCOPY N/A 03/16/2019   Procedure: ESOPHAGOGASTRODUODENOSCOPY (EGD);  Surgeon: Toledo, Benay Pike, MD;  Location: [facility] ENDOSCOPY;  Service: Gastroenterology;  Laterality: N/A;   IR ANGIO INTRA EXTRACRAN SEL COM CAROTID INNOMINATE UNI L MOD SED  02/09/2021   IR INTRAVSC STENT CERV CAROTID W/EMB-PROT MOD SED INCL ANGIO  02/09/2021   IR PERCUTANEOUS ART THROMBECTOMY/INFUSION INTRACRANIAL INC DIAG ANGIO  02/09/2021   IR US GUIDE VASC ACCESS RIGHT  02/09/2021   NO PAST SURGERIES     RADIOLOGY WITH ANESTHESIA N/A 02/09/2021   Procedure: IR WITH ANESTHESIA;  Surgeon: Radiologist, Medication, MD;  Location: [place];  Service: Radiology;  Laterality: N/A;    Social History   Socioeconomic History   Marital status: Divorced    Spouse name: Not on file   Number of children: Not on file   Years of education: Not on file   Highest education level: Not on file  Occupational History   Not on file  Tobacco Use   Smoking status: Every Day    Packs/day: 0.75    Types: Cigarettes   Smokeless tobacco: Never  Vaping Use   Vaping Use: Never used  Substance and Sexual Activity   Alcohol use: Yes    Comment: 24 Oz daily   Drug use: Not Currently    Types: Marijuana   Sexual activity: Not Currently  Other Topics Concern   Not on file  Social History Narrative   Lives at home with mother and 2 dogs Charlie and Personal assistant    Drives   Social Determinants of Health   Financial Resource Strain: Not on file  Food Insecurity: No Food Insecurity (02/20/2021)   Hunger Vital Sign    Worried About Running Out of Food in the Last Year: Never true    [place] in the Last Year: Never true  Transportation Needs: No Transportation Needs (02/20/2021)   PRAPARE - Hydrologist (Medical): No    Lack of Transportation (Non-Medical): No  Physical Activity: Not on file  Stress: No Stress Concern  Present (02/20/2021)   [place]    Feeling of Stress : Only a little  Social Connections: Not on file  Intimate Partner Violence: Not At Risk (02/20/2021)   Humiliation, Afraid, Rape, and Kick questionnaire    Fear of Current or Ex-Partner: No    Emotionally Abused: No    Physically Abused: No    Sexually Abused: No    Family History  Problem Relation Age of Onset   Hypertension Mother    Hyperlipidemia Mother    Heart disease Mother    COPD Mother    Hemochromatosis Mother    Hypertension Father    Hyperlipidemia Father    Cancer Father        prostate   Hemochromatosis Maternal Aunt    Hemochromatosis Maternal Aunt    Hemochromatosis Maternal Uncle    Leukemia Paternal Uncle    Cancer Paternal Grandmother    Breast cancer Paternal Grandmother      Current Outpatient Medications:    aspirin 81 MG chewable tablet, Chew and swallow 1 tablet (81 mg  total) by mouth daily., Disp: 30 tablet, Rfl: 2   atorvastatin (LIPITOR) 40 MG tablet, Take 1 tablet (40 mg total) by mouth daily., Disp: 30 tablet, Rfl: 2   cyanocobalamin (,VITAMIN B-12,) 1000 MCG/ML injection, Inject into the muscle., Disp: , Rfl:    folic acid (FOLVITE) 1 MG tablet, TAKE 2 TABLETS BY MOUTH DAILY., Disp: 30 tablet, Rfl: 0   gabapentin (NEURONTIN) 300 MG capsule, Take 1 capsule (300 mg total) by mouth 4 (four) times daily., Disp: 120 capsule, Rfl: 2   lisinopril (ZESTRIL) 5 MG tablet, Take 5 mg by mouth daily., Disp: , Rfl:    Multiple Vitamins-Minerals (CERTAVITE/ANTIOXIDANTS) TABS, Take 1 tablet by mouth daily., Disp: 30 tablet, Rfl: 2   omeprazole (PRILOSEC) 40 MG capsule, Take 1 capsule (40 mg total) by mouth daily., Disp: 90 capsule, Rfl: 0   thiamine 100 MG tablet, Take 1 tablet (100 mg total) by mouth daily., Disp: 30 tablet, Rfl: 2   ticagrelor (BRILINTA) 90 MG TABS tablet, Take 1 tablet (90 mg total) by mouth 2 (two) times daily., Disp: 60  tablet, Rfl: 2  Physical exam:  Vitals:   07/05/22 1024  BP: (!) 178/89  Pulse: 81  Temp: (!) 97.3 F (36.3 C)  TempSrc: Tympanic  Weight: 138 lb (62.6 kg)   Physical Exam Constitutional:      Appearance: She is not ill-appearing.  Cardiovascular:     Rate and Rhythm: Normal rate and regular rhythm.  Pulmonary:     Effort: Pulmonary effort is normal.  Skin:    General: Skin is warm and dry.     Coloration: Skin is not pale.  Neurological:     Mental Status: She is alert and oriented to person, place, and time.  Psychiatric:        Mood and Affect: Mood normal.        Behavior: Behavior normal.         Latest Ref Rng & Units 02/11/2021    4:36 AM  CMP  Glucose 70 - 99 mg/dL 93   BUN 6 - 20 mg/dL 9   Creatinine 0.44 - 1.00 mg/dL 0.76   Sodium 135 - 145 mmol/L 141   Potassium 3.5 - 5.1 mmol/L 3.8   Chloride 98 - 111 mmol/L 106   CO2 22 - 32 mmol/L 25   Calcium 8.9 - 10.3 mg/dL 8.5       Latest Ref Rng & Units 07/05/2022   10:11 AM  CBC  WBC 4.0 - 10.5 K/uL 11.8   Hemoglobin 12.0 - 15.0 g/dL 14.8   Hematocrit 36.0 - 46.0 % 44.9   Platelets 150 - 400 K/uL 159    Iron/TIBC/Ferritin/ %Sat    Component Value Date/Time   IRON 97 07/05/2022 1011   TIBC 405 07/05/2022 1011   FERRITIN 27 07/05/2022 1011   IRONPCTSAT 24 07/05/2022 1011     Assessment and plan- Patient is a 47 y.o. female is here for follow-up of following issues:  Iron Deficiency- secondary to heavy menses. She has chronically low iron levels. Ferritin pending at time of visit but has resulted and is 27. She previously took monoferric x 1. She is unsure if she has insurance but has been confirmed to have medicaid. We can cancel venofer and proceed with monoferric x 1.  Polycythemia- transient and 1 year prior to her stroke. No polycythemia since that time. Hemoglobin presently normal. Monitor.  Macrocytosis- likely related to iron and folate deficiency. Monitor.  B12 deficiency- previously  274. On  home injections. Currently 302. Continue home monthly injections.  Folate deficiency- normal in January 2023. Monitor.  Smoker - encouraged cessation.  Abnormal uterine bleeding- ref to gynecology  Disposition: Monoferric x 1 4 mo- lab (cbc, ferritin, iron studies, b12) Day to week later see me or Dr. Janese Banks, +/- venofer- la   Visit Diagnosis 1. Iron deficiency   2. B12 deficiency   3. Abnormal uterine bleeding (AUB)    Beckey Rutter, DNP, AGNP-C, [place] at [facility] [phone] (clinic) 07/05/2022

## 2022-07-09 ENCOUNTER — Other Ambulatory Visit: Payer: Self-pay | Admitting: Oncology

## 2022-07-09 ENCOUNTER — Inpatient Hospital Stay: Payer: Medicaid Other

## 2022-07-09 ENCOUNTER — Encounter: Payer: Self-pay | Admitting: Oncology

## 2022-07-09 VITALS — BP 173/80 | HR 82 | Temp 96.9°F

## 2022-07-09 DIAGNOSIS — I10 Essential (primary) hypertension: Secondary | ICD-10-CM | POA: Diagnosis not present

## 2022-07-09 DIAGNOSIS — E611 Iron deficiency: Secondary | ICD-10-CM | POA: Diagnosis not present

## 2022-07-09 DIAGNOSIS — F17210 Nicotine dependence, cigarettes, uncomplicated: Secondary | ICD-10-CM | POA: Diagnosis not present

## 2022-07-09 DIAGNOSIS — E538 Deficiency of other specified B group vitamins: Secondary | ICD-10-CM | POA: Diagnosis not present

## 2022-07-09 DIAGNOSIS — D751 Secondary polycythemia: Secondary | ICD-10-CM | POA: Diagnosis not present

## 2022-07-09 DIAGNOSIS — Z803 Family history of malignant neoplasm of breast: Secondary | ICD-10-CM | POA: Diagnosis not present

## 2022-07-09 DIAGNOSIS — Z8042 Family history of malignant neoplasm of prostate: Secondary | ICD-10-CM | POA: Diagnosis not present

## 2022-07-09 DIAGNOSIS — Z86718 Personal history of other venous thrombosis and embolism: Secondary | ICD-10-CM | POA: Diagnosis not present

## 2022-07-09 DIAGNOSIS — D7589 Other specified diseases of blood and blood-forming organs: Secondary | ICD-10-CM | POA: Diagnosis not present

## 2022-07-09 DIAGNOSIS — Z8673 Personal history of transient ischemic attack (TIA), and cerebral infarction without residual deficits: Secondary | ICD-10-CM | POA: Diagnosis not present

## 2022-07-09 MED ORDER — SODIUM CHLORIDE 0.9 % IV SOLN
1000.0000 mg | Freq: Once | INTRAVENOUS | Status: AC
  Administered 2022-07-09: 1000 mg via INTRAVENOUS
  Filled 2022-07-09: qty 10

## 2022-07-09 MED ORDER — SODIUM CHLORIDE 0.9 % IV SOLN
Freq: Once | INTRAVENOUS | Status: AC
  Filled 2022-07-09: qty 250

## 2022-07-09 NOTE — Patient Instructions (Signed)
Iron Sucrose Injection What is this medication? IRON SUCROSE (EYE ern SOO krose) treats low levels of iron (iron deficiency anemia) in people with kidney disease. Iron is a mineral that plays an important role in making red blood cells, which carry oxygen from your lungs to the rest of your body. This medicine may be used for other purposes; ask your health care provider or pharmacist if you have questions. COMMON BRAND NAME(S): Venofer What should I tell my care team before I take this medication? They need to know if you have any of these conditions: Anemia not caused by low iron levels Heart disease High levels of iron in the blood Kidney disease Liver disease An unusual or allergic reaction to iron, other medications, foods, dyes, or preservatives Pregnant or trying to get pregnant Breastfeeding How should I use this medication? This medication is for infusion into a vein. It is given in a hospital or clinic setting. Talk to your care team about the use of this medication in children. While this medication may be prescribed for children as young as 2 years for selected conditions, precautions do apply. Overdosage: If you think you have taken too much of this medicine contact a poison control center or emergency room at once. NOTE: This medicine is only for you. Do not share this medicine with others. What if I miss a dose? Keep appointments for follow-up doses. It is important not to miss your dose. Call your care team if you are unable to keep an appointment. What may interact with this medication? Do not take this medication with any of the following: Deferoxamine Dimercaprol Other iron products This medication may also interact with the following: Chloramphenicol Deferasirox This list may not describe all possible interactions. Give your health care provider a list of all the medicines, herbs, non-prescription drugs, or dietary supplements you use. Also tell them if you smoke,  drink alcohol, or use illegal drugs. Some items may interact with your medicine. What should I watch for while using this medication? Visit your care team regularly. Tell your care team if your symptoms do not start to get better or if they get worse. You may need blood work done while you are taking this medication. You may need to follow a special diet. Talk to your care team. Foods that contain iron include: whole grains/cereals, dried fruits, beans, or peas, leafy green vegetables, and organ meats (liver, kidney). What side effects may I notice from receiving this medication? Side effects that you should report to your care team as soon as possible: Allergic reactions--skin rash, itching, hives, swelling of the face, lips, tongue, or throat Low blood pressure--dizziness, feeling faint or lightheaded, blurry vision Shortness of breath Side effects that usually do not require medical attention (report to your care team if they continue or are bothersome): Flushing Headache Joint pain Muscle pain Nausea Pain, redness, or irritation at injection site This list may not describe all possible side effects. Call your doctor for medical advice about side effects. You may report side effects to FDA at 1-800-FDA-1088. Where should I keep my medication? This medication is given in a hospital or clinic and will not be stored at home. NOTE: This sheet is a summary. It may not cover all possible information. If you have questions about this medicine, talk to your doctor, pharmacist, or health care provider.  2023 Elsevier/Gold Standard (2020-09-18 00:00:00)

## 2022-07-09 NOTE — Addendum Note (Signed)
Addended by: Verlon Au on: 07/09/2022 02:22 PM   Modules accepted: Orders

## 2022-07-16 ENCOUNTER — Ambulatory Visit: Payer: Medicaid Other

## 2022-07-23 ENCOUNTER — Ambulatory Visit: Payer: Medicaid Other

## 2022-07-30 ENCOUNTER — Ambulatory Visit: Payer: Medicaid Other

## 2022-08-02 ENCOUNTER — Encounter: Payer: Medicaid Other | Admitting: Obstetrics & Gynecology

## 2022-08-14 IMAGING — CT CT HEAD CODE STROKE
3 series · 16 of 47 positions shown, 19 images · non-contrast
Comparison: 08/31/2009

CLINICAL DATA: Code stroke. Neuro deficit, acute, stroke suspected.
Left-sided weakness and slurred speech.

EXAM:
CT HEAD WITHOUT CONTRAST
TECHNIQUE: Contiguous axial images were obtained from the base of the skull
through the vertex without intravenous contrast.

[Series 3: head wo · axial · 0.43mm/px · z∈[+520,+650]mm · 10 of 32 slices shown, 13 images]
[im 3/32  brain]
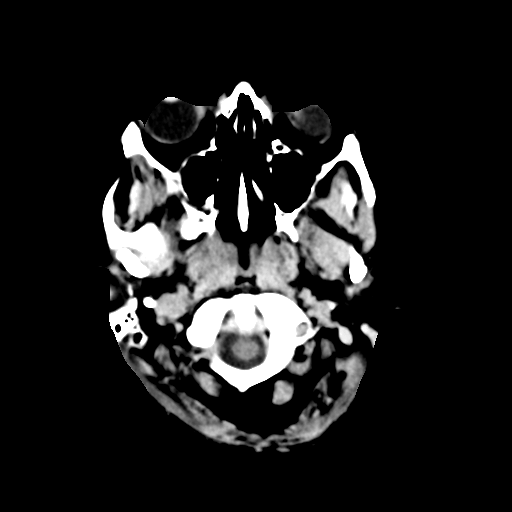
[im 3/32  bone]
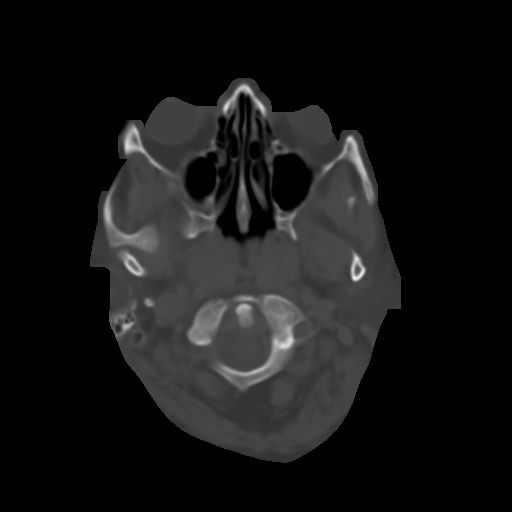
[im 6/32  brain]
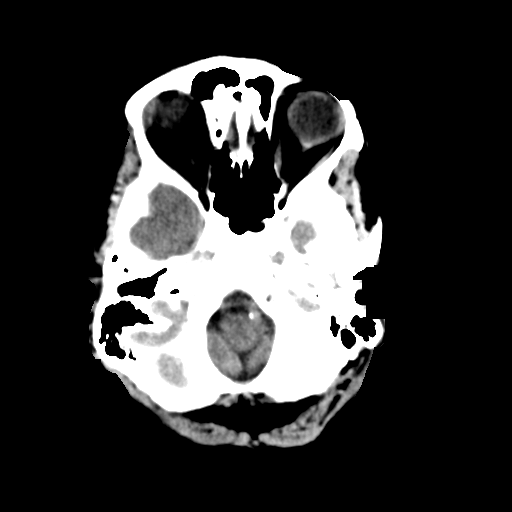
[im 9/32  brain]
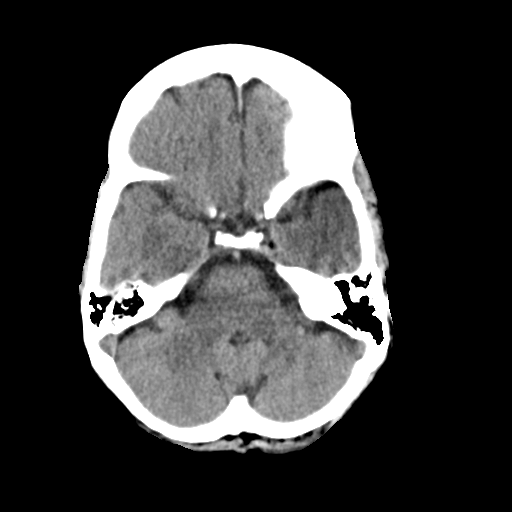
[im 11/32  brain]
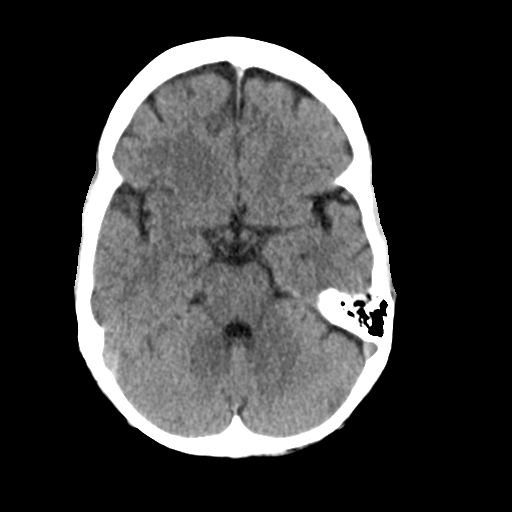
[im 14/32  brain]
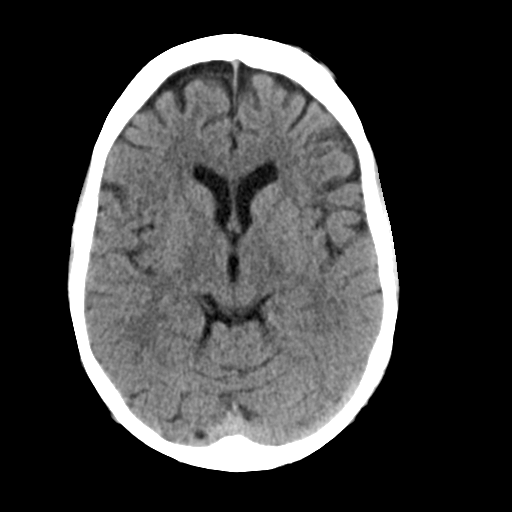
[im 14/32  bone]
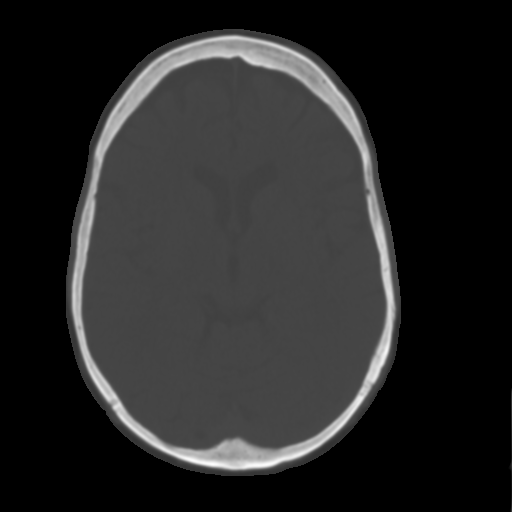
[im 18/32  brain]
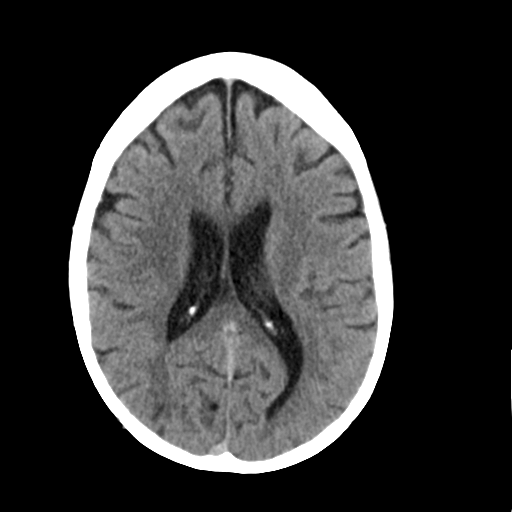
[im 21/32  brain]
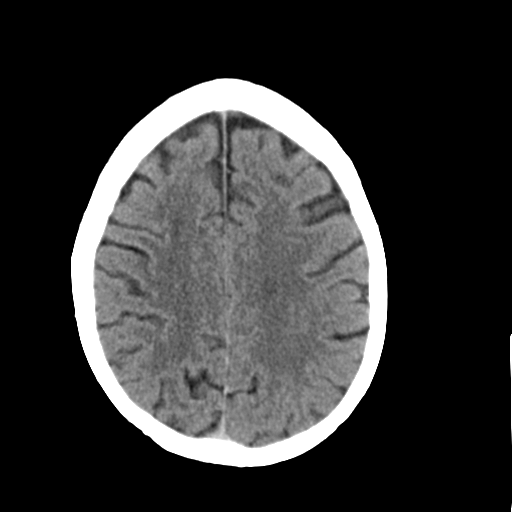
[im 24/32  brain]
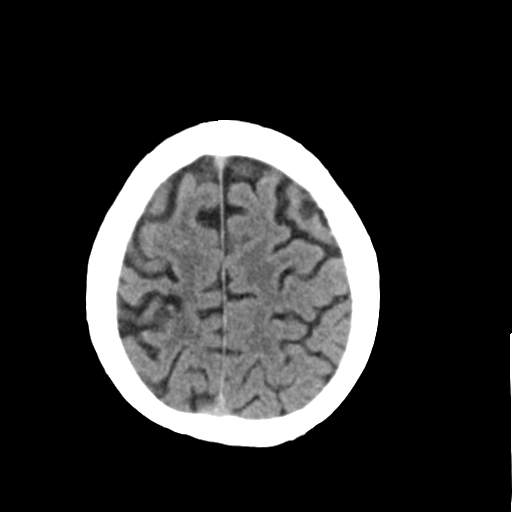
[im 26/32  brain]
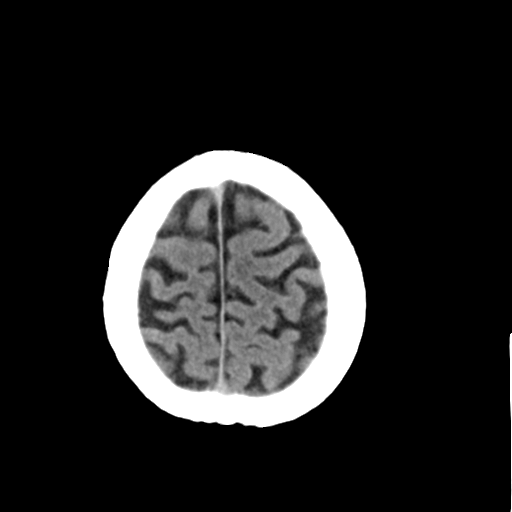
[im 26/32  bone]
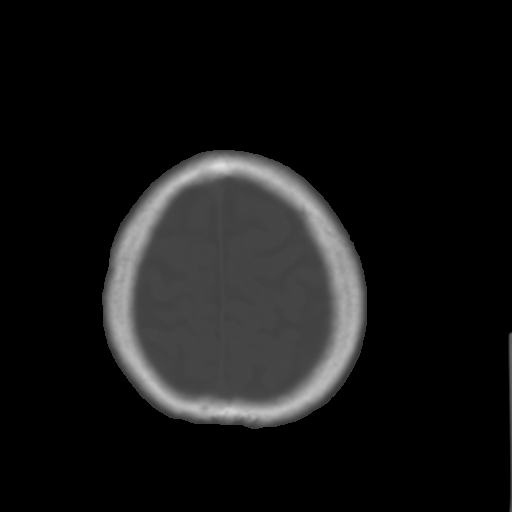
[im 29/32  brain]
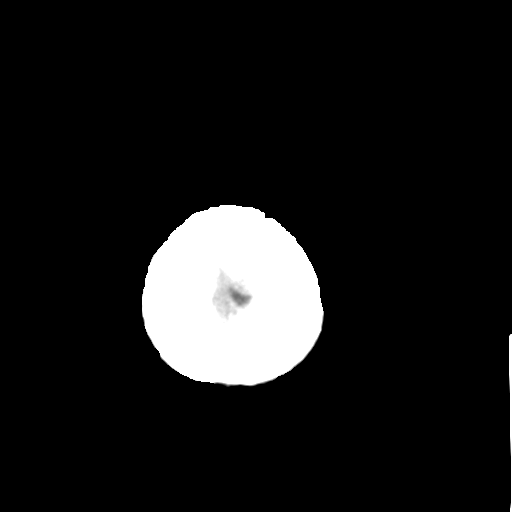

[Series 5: coronal soft tissue · coronal · 0.31mm/px · 3 of 69 slices shown]
[im 23/69  brain]
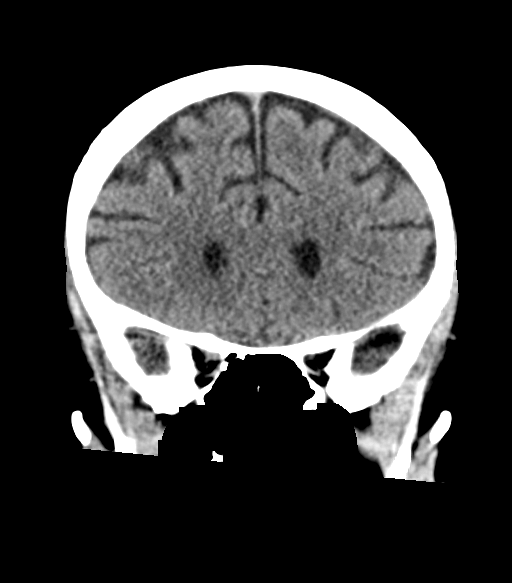
[im 31/69  brain]
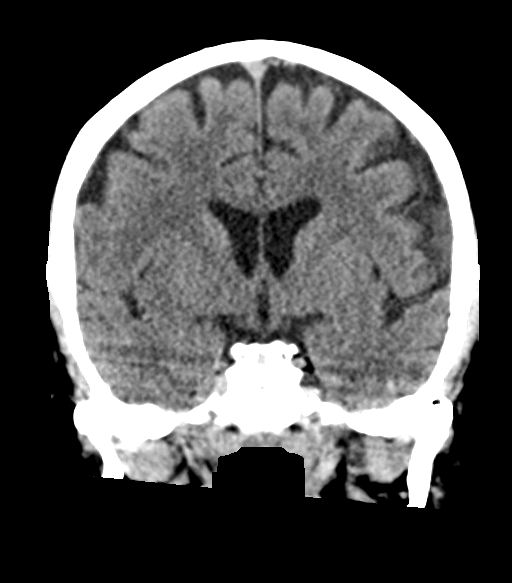
[im 38/69  brain]
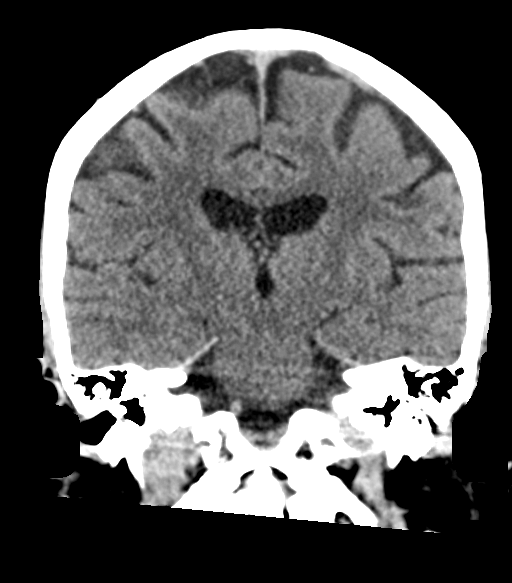

[Series 6: sagittal soft tissue · sagittal · 0.36mm/px · 3 of 54 slices shown]
[im 18/54  brain]
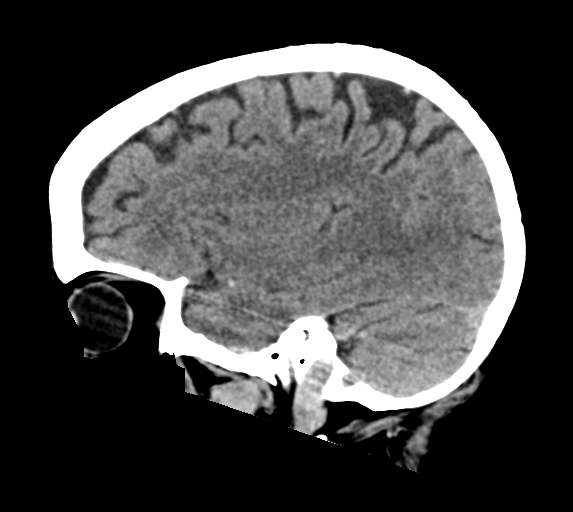
[im 27/54  brain]
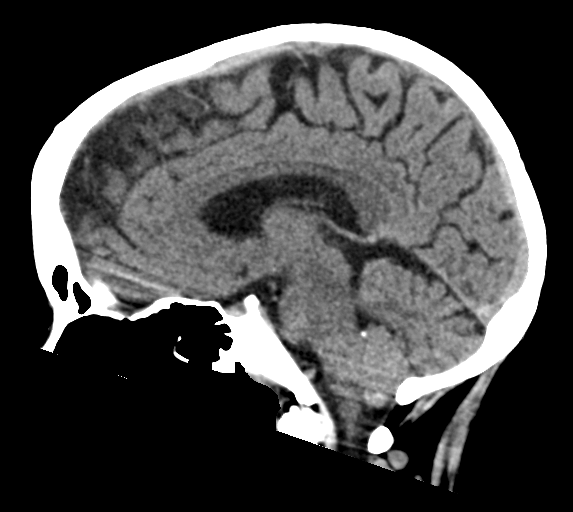
[im 36/54  brain]
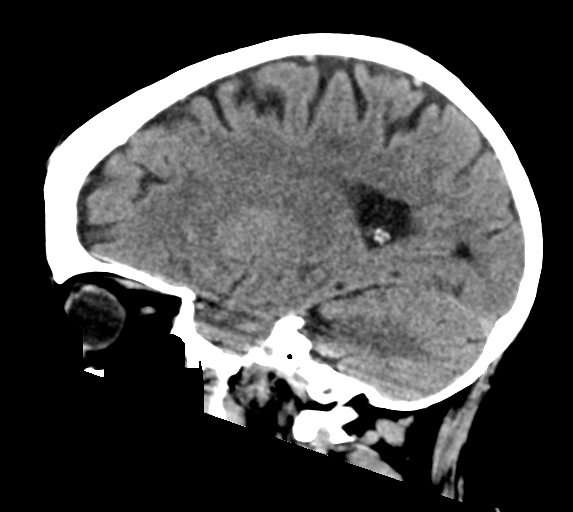

[16 of 47 positions shown; findings below may reference images not displayed]

FINDINGS: Brain: A small chronic right frontoparietal cortical infarct is new.
No definite acute infarct, intracranial hemorrhage, mass, midline
shift, or extra-axial fluid collection is identified. The ventricles
are normal in size.

Vascular: Hyperdense right MCA.

Skull: No fracture or suspicious osseous lesion.

Sinuses/Orbits: Visualized paranasal sinuses and mastoid air cells
are clear. Unremarkable orbits.

Other: None.

ASPECTS (Alberta Stroke Program Early CT Score)

- Ganglionic level infarction (caudate, lentiform nuclei, internal
capsule, insula, M1-M3 cortex): 7

- Supraganglionic infarction (M4-M6 cortex): 3

Total score (0-10 with 10 being normal): 10
IMPRESSION: 1. Interval small chronic right frontoparietal cortical infarct.
2. No definite acute infarct or intracranial hemorrhage.
3. Hyperdense right MCA.  CTA is pending.

These results were communicated to Dr. Brinkerhoff at [DATE] on 02/09/2021
by text page via the AM[employer] messaging system.

## 2022-08-25 ENCOUNTER — Telehealth: Payer: Self-pay

## 2022-08-25 NOTE — Telephone Encounter (Signed)
Mychart msg sent

## 2022-09-29 ENCOUNTER — Encounter: Payer: Self-pay | Admitting: Oncology

## 2022-10-14 ENCOUNTER — Ambulatory Visit: Payer: Self-pay | Admitting: Family Medicine

## 2022-10-19 ENCOUNTER — Ambulatory Visit
Admission: RE | Admit: 2022-10-19 | Discharge: 2022-10-19 | Disposition: A | Discharge status: Home / Self Care | Payer: 59 | Attending: Family Medicine | Admitting: Family Medicine

## 2022-10-19 ENCOUNTER — Encounter: Payer: Self-pay | Admitting: Family Medicine

## 2022-10-19 ENCOUNTER — Ambulatory Visit
Admission: RE | Admit: 2022-10-19 | Discharge: 2022-10-19 | Disposition: A | Discharge status: Home / Self Care | Payer: 59 | Source: Ambulatory Visit | Attending: Family Medicine | Admitting: Family Medicine

## 2022-10-19 ENCOUNTER — Encounter: Payer: Self-pay | Admitting: Oncology

## 2022-10-19 ENCOUNTER — Ambulatory Visit (INDEPENDENT_AMBULATORY_CARE_PROVIDER_SITE_OTHER): Payer: 59 | Admitting: Family Medicine

## 2022-10-19 VITALS — BP 144/86 | HR 107 | Ht 65.0 in | Wt 126.0 lb

## 2022-10-19 DIAGNOSIS — I6930 Unspecified sequelae of cerebral infarction: Secondary | ICD-10-CM

## 2022-10-19 DIAGNOSIS — M545 Low back pain, unspecified: Secondary | ICD-10-CM

## 2022-10-19 DIAGNOSIS — Z8673 Personal history of transient ischemic attack (TIA), and cerebral infarction without residual deficits: Secondary | ICD-10-CM | POA: Insufficient documentation

## 2022-10-19 DIAGNOSIS — R112 Nausea with vomiting, unspecified: Secondary | ICD-10-CM | POA: Diagnosis not present

## 2022-10-19 DIAGNOSIS — G8929 Other chronic pain: Secondary | ICD-10-CM

## 2022-10-19 DIAGNOSIS — M546 Pain in thoracic spine: Secondary | ICD-10-CM

## 2022-10-19 DIAGNOSIS — I10 Essential (primary) hypertension: Secondary | ICD-10-CM

## 2022-10-19 MED ORDER — MELOXICAM 15 MG PO TABS: 15.0000 mg | ORAL_TABLET | Freq: Every day | ORAL | 2 refills | Status: DC | PRN

## 2022-10-19 MED ORDER — MELOXICAM 15 MG PO TABS
15.0000 mg | ORAL_TABLET | Freq: Every day | ORAL | 2 refills | Status: DC | PRN
Start: 2022-10-19 — End: 2023-02-01

## 2022-10-19 MED ORDER — ONDANSETRON 4 MG PO TBDP: 4.0000 mg | ORAL_TABLET | Freq: Three times a day (TID) | ORAL | 2 refills | Status: DC | PRN

## 2022-10-19 MED ORDER — TIZANIDINE HCL 4 MG PO TABS: 4.0000 mg | ORAL_TABLET | Freq: Three times a day (TID) | ORAL | 2 refills | Status: DC | PRN

## 2022-10-19 MED ORDER — ONDANSETRON 4 MG PO TBDP
4.0000 mg | ORAL_TABLET | Freq: Three times a day (TID) | ORAL | 2 refills | Status: DC | PRN
Start: 2022-10-19 — End: 2023-08-17

## 2022-10-19 MED ORDER — TIZANIDINE HCL 4 MG PO TABS
4.0000 mg | ORAL_TABLET | Freq: Three times a day (TID) | ORAL | 2 refills | Status: DC | PRN
Start: 2022-10-19 — End: 2023-03-11

## 2022-10-19 NOTE — Patient Instructions (Addendum)
Thank you for coming to the office today.  X-rays today, Thoracic and lumbar Spine  Stay tuned for information  For the back  Start the Meloxicam 15mg  daily for 1-2 weeks, then pause for 1-2 weeks, take it episodic  Tizanidine muscle relaxant 4mg  dose as needed every 8 hours or 3 times a day max, prefer to take intermittently, caution with sedation.  Okay to take with Gabapentin.  We can consider Sports Medicine vs Orthopedic consultation depending on the X-rays and how you do.  Back up plan is Prednisone steroid, 1 week taper if needed. Let me know. If you do this, need to PAUSE Meloxicam.  Days you don't take Meloxicam, could try this - One other thing to try may consider anti inflammatory topical - OTC Voltaren (generic Diclofenac) topical 2-4 times a day as needed for pain swelling of affected joint for 1-2 weeks or longer.  ----------------------  For Nausea  May be stomach acid / digestion related.  Trial on Zofran dissolving tab.  May possibly be side effect on Gabapentin vs Brilinta.  Reconsider GI referral for further management if not improving.  ---------------------------------  [facility] [address], [place], [place] [address] Phone: [phone]    Please schedule a Follow-up Appointment to: Return in about 3 months (around 01/18/2023) for 3 months for follow-up back pain, nausea.  If you have any other questions or concerns, please feel free to call the office or send a message through MyChart. You may also schedule an earlier appointment if necessary.  Additionally, you may be receiving a survey about your experience at our office within a few days to 1 week by e-mail or mail. We value your feedback.  [name], DO [facility] [place]

## 2022-10-19 NOTE — Progress Notes (Unsigned)
Subjective:    Patient ID: Joanna Garcia, female    DOB: [birth year], 47 y.o.   MRN: [id number]  Joanna Garcia is a 47 y.o. female presenting on 10/19/2022 for New Patient (Initial Visit), Back Pain (Present more than 6 months, seen chiropractor and massage therapy.  Denies injury), and Nausea (Present 2 months, using dramamine)   HPI  Establish care with new PCP. 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 Today seems to be persistent problem with back pain, not resolving but not worsening. Describes pain as persistent or constant, moderate to severe pain with intermittent worsening with prolonged standing at work, Geographical information systems officer Weight at work ranges 10 to 25 lbs No injury or trigger that she is aware of. It occurred before the current job No pain radiating to legs. Has seen [facility] for 6 x sessions, and also the massage therapy currently x 2 month, no progress. They did X-rays at [facility] but uncertain full results, said there was some scoliosis only. - Tried topical relief with pain relief analgesia and biofreeze - Taking Ibuprofen 200mg  x 2 = 400mg  a few times a day without relief - Taking Tylenol 500mg  x x2 few times a day without relief - Tried heating pad at night, ice pack No prior back surgery - Denies any fevers/chills, numbness, tingling, weakness, loss of control bladder/bowel incontinence or retention, unintentional wt loss, night sweats  Peripheral Neuropathy Gabapentin 300mg  3 AM, 3 afternoon and 4 PM - 10 pills per day Not drowsy or  groggy  Previously at [facility]  Chronic Nausea Reports 2 months, persistent issue. Usually worse in morning when get up, or episodic during day. Will feel warm sensation and nauseas. Episodic Takes Dramamine OTC Seems unrelated to eating drinking Not endorsing GERD or heartburn, taking Omeprazole 40mg  daily She had Upper Endoscopy 2020 along with Colonscopy, due to low hemoglobin looking for blood loss. Admits occasional vomiting episodes. Admits increased gas production belching. Denies motion sickness or spinning or dizziness.  Health Maintenance:      02/20/2021    3:27 PM  Depression screen PHQ 2/9  Decreased Interest 0  Down, Depressed, Hopeless 0  PHQ - 2 Score 0    Past Medical History:  Diagnosis Date   DVT (deep venous thrombosis) (HCC)    GERD (gastroesophageal reflux disease)    Hypertension    Polycythemia    Polyneuropathy    Stroke [facility])    Past Surgical History:  Procedure Laterality Date   COLONOSCOPY N/A 03/16/2019   Procedure: COLONOSCOPY;  Surgeon: Toledo, Boykin Nearing, MD;  Location: [facility] ENDOSCOPY;  Service: Gastroenterology;  Laterality: N/A;   ESOPHAGOGASTRODUODENOSCOPY N/A 03/16/2019   Procedure: ESOPHAGOGASTRODUODENOSCOPY (EGD);  Surgeon: Toledo, Boykin Nearing, MD;  Location: [facility] ENDOSCOPY;  Service: Gastroenterology;  Laterality: N/A;   IR ANGIO INTRA EXTRACRAN SEL COM CAROTID INNOMINATE UNI L MOD SED  02/09/2021   IR INTRAVSC STENT CERV CAROTID W/EMB-PROT MOD SED INCL ANGIO  02/09/2021   IR PERCUTANEOUS ART THROMBECTOMY/INFUSION INTRACRANIAL INC DIAG ANGIO  02/09/2021   IR US GUIDE VASC ACCESS RIGHT  02/09/2021   NO  PAST SURGERIES     RADIOLOGY WITH ANESTHESIA N/A 02/09/2021   Procedure: IR WITH ANESTHESIA;  Surgeon: Radiologist, Medication, MD;  Location: [facility] OR;  Service: Radiology;  Laterality: N/A;   Social History   Socioeconomic History   Marital status: Divorced    Spouse name: Not on file   Number of children: Not on file   Years  of education: Not on file   Highest education level: Not on file  Occupational History   Not on file  Tobacco Use   Smoking status: Every Day    Packs/day: .75    Types: Cigarettes    Start date: 06/21/1992   Smokeless tobacco: Never  Vaping Use   Vaping Use: Never used  Substance and Sexual Activity   Alcohol use: Yes    Comment: 24 Oz daily   Drug use: Not Currently    Types: Marijuana   Sexual activity: Not Currently  Other Topics Concern   Not on file  Social History Narrative   Lives at home with mother and 2 dogs Charlie and Clauie    Drives   Social Determinants of Health   Financial Resource Strain: Not on file  Food Insecurity: No Food Insecurity (02/20/2021)   Hunger Vital Sign    Worried About Running Out of Food in the Last Year: Never true    Ran Out of Food in the Last Year: Never true  Transportation Needs: No Transportation Needs (02/20/2021)   PRAPARE - Administrator, Civil Service (Medical): No    Lack of Transportation (Non-Medical): No  Physical Activity: Not on file  Stress: No Stress Concern Present (02/20/2021)   [employer] of Occupational Health - Occupational Stress Questionnaire    Feeling of Stress : Only a little  Social Connections: Not on file  Intimate Partner Violence: Not At Risk (02/20/2021)   Humiliation, Afraid, Rape, and Kick questionnaire    Fear of Current or Ex-Partner: No    Emotionally Abused: No    Physically Abused: No    Sexually Abused: No   Family History  Problem Relation Age of Onset   Hypertension Mother    Hyperlipidemia Mother    Heart disease Mother    COPD Mother    Hemochromatosis Mother    Hypertension Father    Hyperlipidemia Father    Cancer Father        prostate   Hemochromatosis Maternal Aunt    Hemochromatosis Maternal Aunt    Hemochromatosis Maternal Uncle    Leukemia Paternal Uncle    Cancer Paternal Grandmother    Breast cancer Paternal Grandmother    Current Outpatient  Medications on File Prior to Visit  Medication Sig   aspirin 81 MG chewable tablet Chew and swallow 1 tablet (81 mg total) by mouth daily.   atorvastatin (LIPITOR) 80 MG tablet Take 80 mg by mouth daily.   B-D 3CC LUER-LOK SYR 25GX1" 25G X 1" 3 ML MISC    cyanocobalamin (,VITAMIN B-12,) 1000 MCG/ML injection Inject into the muscle.   folic acid (FOLVITE) 1 MG tablet TAKE 2 TABLETS BY MOUTH DAILY.   gabapentin (NEURONTIN) 300 MG capsule Take 1 capsule (300 mg total) by mouth 4 (four) times daily.   omeprazole (PRILOSEC) 40 MG capsule Take 1 capsule (40 mg total) by mouth daily.   ticagrelor (BRILINTA) 90 MG TABS tablet Take 1 tablet (90 mg total) by mouth 2 (two) times daily.   No current facility-administered medications on file prior to visit.  Review of Systems Per HPI unless specifically indicated above     Objective:    BP (!) 144/86 (BP Location: Left Arm, Cuff Size: Normal)   Pulse (!) 107   Ht 5\' 5"  (1.651 m)   Wt 126 lb (57.2 kg)   LMP 08/28/2017   SpO2 97%   BMI 20.97 kg/m   Wt Readings from Last 3 Encounters:  10/19/22 126 lb (57.2 kg)  07/05/22 138 lb (62.6 kg)  12/30/21 147 lb 12.8 oz (67 kg)    Physical Exam Vitals and nursing note reviewed.  Constitutional:      General: She is not in acute distress.    Appearance: Normal appearance. She is well-developed. She is not diaphoretic.     Comments: Well-appearing, comfortable, cooperative  HENT:     Head: Normocephalic and atraumatic.  Eyes:     General:        Right eye: No discharge.        Left eye: No discharge.     Conjunctiva/sclera: Conjunctivae normal.  Cardiovascular:     Rate and Rhythm: Normal rate.  Pulmonary:     Effort: Pulmonary effort is normal.  Skin:    General: Skin is warm and dry.     Findings: No erythema or rash.  Neurological:     Mental Status: She is alert and oriented to person, place, and time.  Psychiatric:        Mood and Affect: Mood normal.        Behavior: Behavior  normal.        Thought Content: Thought content normal.     Comments: Well groomed, good eye contact, normal speech and thoughts    Results for orders placed or performed in visit on 07/05/22  Vitamin B12  Result Value Ref Range   Vitamin B-12 302 180 - 914 pg/mL  Iron and TIBC  Result Value Ref Range   Iron 97 28 - 170 ug/dL   TIBC 161 096 - 045 ug/dL   Saturation Ratios 24 10.4 - 31.8 %   UIBC 308 ug/dL  Ferritin  Result Value Ref Range   Ferritin 27 11 - 307 ng/mL  CBC with Differential/Platelet  Result Value Ref Range   WBC 11.8 (H) 4.0 - 10.5 K/uL   RBC 4.42 3.87 - 5.11 MIL/uL   Hemoglobin 14.8 12.0 - 15.0 g/dL   HCT 40.9 81.1 - 91.4 %   MCV 101.6 (H) 80.0 - 100.0 fL   MCH 33.5 26.0 - 34.0 pg   MCHC 33.0 30.0 - 36.0 g/dL   RDW 78.2 95.6 - 21.3 %   Platelets 159 150 - 400 K/uL   nRBC 0.0 0.0 - 0.2 %   Neutrophils Relative % 73 %   Neutro Abs 8.7 (H) 1.7 - 7.7 K/uL   Lymphocytes Relative 17 %   Lymphs Abs 2.0 0.7 - 4.0 K/uL   Monocytes Relative 7 %   Monocytes Absolute 0.8 0.1 - 1.0 K/uL   Eosinophils Relative 2 %   Eosinophils Absolute 0.2 0.0 - 0.5 K/uL   Basophils Relative 1 %   Basophils Absolute 0.1 0.0 - 0.1 K/uL   Immature Granulocytes 0 %   Abs Immature Granulocytes 0.05 0.00 - 0.07 K/uL      Assessment & Plan:   Problem List Items Addressed This Visit     Essential hypertension   Relevant Medications   atorvastatin (LIPITOR) 80 MG tablet   History of cerebrovascular accident (CVA) with residual deficit  Other Visit Diagnoses     Chronic left-sided thoracic back pain    -  Primary   Relevant Medications   meloxicam (MOBIC) 15 MG tablet   tiZANidine (ZANAFLEX) 4 MG tablet   Other Relevant Orders   DG Thoracic Spine W/Swimmers   Chronic left-sided low back pain without sciatica       Relevant Medications   meloxicam (MOBIC) 15 MG tablet   tiZANidine (ZANAFLEX) 4 MG tablet   Other Relevant Orders   DG Lumbar Spine Complete   Nausea and  vomiting, unspecified vomiting type       Relevant Medications   ondansetron (ZOFRAN-ODT) 4 MG disintegrating tablet        X-rays today, Thoracic and lumbar Spine  Stay tuned for information  For the back  Start the Meloxicam 15mg  daily for 1-2 weeks, then pause for 1-2 weeks, take it episodic  Tizanidine muscle relaxant 4mg  dose as needed every 8 hours or 3 times a day max, prefer to take intermittently, caution with sedation.  Okay to take with Gabapentin.  We can consider Sports Medicine vs Orthopedic consultation depending on the X-rays and how you do.  Back up plan is Prednisone steroid, 1 week taper if needed. Let me know. If you do this, need to PAUSE Meloxicam.  Days you don't take Meloxicam, could try this - One other thing to try may consider anti inflammatory topical - OTC Voltaren (generic Diclofenac) topical 2-4 times a day as needed for pain swelling of affected joint for 1-2 weeks or longer.  ----------------------  For Nausea  May be stomach acid / digestion related.  Trial on Zofran dissolving tab.  May possibly be side effect on Gabapentin vs Brilinta.  Reconsider GI referral for further management if not improving.  Orders Placed This Encounter  Procedures   DG Lumbar Spine Complete    Standing Status:   Future    Number of Occurrences:   1    Standing Expiration Date:   10/19/2023    Order Specific Question:   Reason for Exam (SYMPTOM  OR DIAGNOSIS REQUIRED)    Answer:   lower thoracic and lower Left sided pain, 6+ months, previously told has scoliosis    Order Specific Question:   Is patient pregnant?    Answer:   No    Order Specific Question:   Preferred imaging location?    Answer:   [facility] Thoracic Spine W/Swimmers    Standing Status:   Future    Number of Occurrences:   1    Standing Expiration Date:   10/19/2023    Order Specific Question:   Reason for Exam (SYMPTOM  OR DIAGNOSIS REQUIRED)    Answer:   lower thoracic and  lower Left sided pain, 6+ months, previously told has scoliosis    Order Specific Question:   Is patient pregnant?    Answer:   No    Order Specific Question:   Preferred imaging location?    Answer:   [facility]      Meds ordered this encounter  Medications   DISCONTD: tiZANidine (ZANAFLEX) 4 MG tablet    Sig: Take 1 tablet (4 mg total) by mouth every 8 (eight) hours as needed for muscle spasms.    Dispense:  90 tablet    Refill:  2   DISCONTD: meloxicam (MOBIC) 15 MG tablet    Sig: Take 1 tablet (15 mg total) by mouth daily as needed for pain (back  pain).    Dispense:  30 tablet    Refill:  2   DISCONTD: ondansetron (ZOFRAN-ODT) 4 MG disintegrating tablet    Sig: Take 1 tablet (4 mg total) by mouth every 8 (eight) hours as needed for nausea or vomiting.    Dispense:  30 tablet    Refill:  2   meloxicam (MOBIC) 15 MG tablet    Sig: Take 1 tablet (15 mg total) by mouth daily as needed for pain (back pain).    Dispense:  30 tablet    Refill:  2   ondansetron (ZOFRAN-ODT) 4 MG disintegrating tablet    Sig: Take 1 tablet (4 mg total) by mouth every 8 (eight) hours as needed for nausea or vomiting.    Dispense:  30 tablet    Refill:  2   tiZANidine (ZANAFLEX) 4 MG tablet    Sig: Take 1 tablet (4 mg total) by mouth every 8 (eight) hours as needed for muscle spasms.    Dispense:  90 tablet    Refill:  2      Follow up plan: Return in about 3 months (around 01/18/2023) for 3 months for follow-up back pain, nausea.  [name], DO [facility] 10/19/2022, 8:46 AM

## 2022-10-20 ENCOUNTER — Encounter: Payer: Self-pay | Admitting: Family Medicine

## 2022-11-04 ENCOUNTER — Encounter: Payer: Self-pay | Admitting: Oncology

## 2022-11-05 ENCOUNTER — Ambulatory Visit: Payer: Medicaid Other | Admitting: Oncology

## 2022-11-05 ENCOUNTER — Ambulatory Visit: Payer: Medicaid Other

## 2022-11-05 ENCOUNTER — Other Ambulatory Visit: Payer: Medicaid Other

## 2022-11-08 ENCOUNTER — Telehealth: Payer: Self-pay

## 2022-11-08 ENCOUNTER — Other Ambulatory Visit: Payer: Self-pay | Admitting: Family Medicine

## 2022-11-08 DIAGNOSIS — G8929 Other chronic pain: Secondary | ICD-10-CM

## 2022-11-08 DIAGNOSIS — M542 Cervicalgia: Secondary | ICD-10-CM

## 2022-11-08 DIAGNOSIS — I6930 Unspecified sequelae of cerebral infarction: Secondary | ICD-10-CM

## 2022-11-08 NOTE — Telephone Encounter (Signed)
Copied from CRM [id number]. Topic: General - Other >> Nov 08, 2022 12:05 PM Joanna Garcia wrote: Reason for CRM: Pt states the last time she was in the office she discussed her back issues with her PCP and was prescribed pain pills and Garcia muscle relaxer and was told if that does not help to call and let him know and he will put in an order for Garcia MRI. Pt states the medications are not helping and is wanting an MRI done. Please call pt to discuss.

## 2022-11-08 NOTE — Telephone Encounter (Signed)
Called patient back.  Discussed scenario of next steps.  Her T and L spine x-rays were entirely negative. No arthritis or other issues identified.  I advised T Spine MRI is unlikely to be covered at this time.  Even though her symptoms >6+ months and prior other therapy tried. It is possible but I am uncertain exactly what we are looking for at this time.  I would suggest consult with non surgical specialist for further eval.  Either Sports Medicine vs Physiatry next for eval and may proceed w/ PT course and MRI if not improving.  I will curbside them Tues 5/21 and see which option will be able to help further.  [name], DO [facility] 11/08/2022, 6:02 PM

## 2022-11-08 NOTE — Telephone Encounter (Signed)
Medication Refill - Medication: gabapentin (NEURONTIN) 300 MG capsule  ticagrelor (BRILINTA) 90 MG TABS tablet   Has the patient contacted their pharmacy? No. Pt first time doing a medication refill request, she is a new pt to the practice. I did explain to reach out to her pharmacy next time for med refill.   Preferred Pharmacy (with phone number or street name): [facility] - [place], [place] - [address]  Phone: [phone] Fax: [phone]  Has the patient been seen for an appointment in the last year OR does the patient have an upcoming appointment? Yes.    Agent: Please be advised that RX refills may take up to 3 business days. We ask that you follow-up with your pharmacy.

## 2022-11-09 MED ORDER — GABAPENTIN 300 MG PO CAPS: ORAL_CAPSULE | ORAL | 2 refills | Status: DC

## 2022-11-09 MED ORDER — TICAGRELOR 90 MG PO TABS: 90.0000 mg | ORAL_TABLET | Freq: Two times a day (BID) | ORAL | 2 refills | Status: DC

## 2022-11-09 NOTE — Addendum Note (Signed)
Addended by: Smitty Cords on: 11/09/2022 05:30 PM   Modules accepted: Orders

## 2022-11-09 NOTE — Telephone Encounter (Signed)
Requested medication (s) are due for refill today: yes  Requested medication (s) are on the active medication list: yes    Last refill: gabapentin  02/11/21  #120  2 refills   Ticagrelor   02/11/21  #60  2 refills  Future visit scheduled No  Notes to clinic:Historical Provider, please review. Thank you.  Requested Prescriptions  Pending Prescriptions Disp Refills   gabapentin (NEURONTIN) 300 MG capsule 120 capsule 2    Sig: Take 1 capsule (300 mg total) by mouth 4 (four) times daily.     Neurology: Anticonvulsants - gabapentin Failed - 11/08/2022 12:39 PM      Failed - Cr in normal range and within 360 days    Creatinine, Ser  Date Value Ref Range Status  02/11/2021 0.76 0.44 - 1.00 mg/dL Final         Failed - Completed PHQ-2 or PHQ-9 in the last 360 days      Passed - Valid encounter within last 12 months    Recent Outpatient Visits           3 weeks ago Chronic left-sided thoracic back pain   [facility], Netta Neat, DO               ticagrelor (BRILINTA) 90 MG TABS tablet 60 tablet 2    Sig: Take 1 tablet (90 mg total) by mouth 2 (two) times daily.     Hematology: Antiplatelets - ticagrelor Failed - 11/08/2022 12:39 PM      Failed - Cr in normal range and within 180 days    Creatinine, Ser  Date Value Ref Range Status  02/11/2021 0.76 0.44 - 1.00 mg/dL Final         Passed - HCT in normal range and within 180 days    HCT  Date Value Ref Range Status  07/05/2022 44.9 36.0 - 46.0 % Final         Passed - HGB in normal range and within 180 days    Hemoglobin  Date Value Ref Range Status  07/05/2022 14.8 12.0 - 15.0 g/dL Final         Passed - PLT in normal range and within 180 days    Platelets  Date Value Ref Range Status  07/05/2022 159 150 - 400 K/uL Final         Passed - Last Heart Rate in normal range    Pulse Readings from Last 1 Encounters:  10/19/22 (!) 107         Passed - Valid encounter within  last 6 months    Recent Outpatient Visits           3 weeks ago Chronic left-sided thoracic back pain   [facility] [place], Netta Neat, [place]

## 2022-11-09 NOTE — Telephone Encounter (Signed)
Please notify patient.  I spoke with Dr Ashley Royalty [facility] in [place])  He agrees to see her in consultation soon for her chronic back pain.  He did recommend that we also do a Cervical Spine X-ray here at our office first, before he does his evaluation.  I have placed order for C-Spine X-ray here at [facility]. She can do walk in apt for X-ray anytime Mon-Thurs, 8am to 4pm, not during 12-115.  I will go ahead and place referral to Sports Medicine as well. Dr Ashley Royalty' office will contact her for scheduling.  [name], DO [facility] 11/09/2022, 5:27 PM

## 2022-11-10 NOTE — Telephone Encounter (Signed)
LMTCB 11/10/2022.  PEC please advise pt when she calls back.   Thanks,   -Vernona Rieger

## 2022-11-17 ENCOUNTER — Telehealth: Payer: Self-pay | Admitting: Family Medicine

## 2022-11-17 DIAGNOSIS — I6930 Unspecified sequelae of cerebral infarction: Secondary | ICD-10-CM

## 2022-11-17 DIAGNOSIS — K219 Gastro-esophageal reflux disease without esophagitis: Secondary | ICD-10-CM

## 2022-11-17 MED ORDER — CLOPIDOGREL BISULFATE 75 MG PO TABS
75.0000 mg | ORAL_TABLET | Freq: Every day | ORAL | 2 refills | Status: DC
Start: 2022-11-17 — End: 2023-02-08

## 2022-11-17 NOTE — Telephone Encounter (Signed)
Pt called in about ticagrelor (BRILINTA) 90 MG TABS tablet , which isnt covered under her insurance and was looking for alternative. Please call back

## 2022-11-17 NOTE — Telephone Encounter (Signed)
Please notify patient that I will switch her to  Clopidogrel (plavix) 75mg  daily instead of Brilinta.  New rx sent to [facility].  Also, on this new med, she should not mix it with "Omeprazole" if she still takes Omeprazole (chart says it may have been an old rx or OTC), she will need to switch this to a new rx Pantoprazole - I can re order it. I just need to know what dosage she was taking on Omeprazole if it was still current.  There is an interaction with Omeprazole + Plavix, but not with Pantoprazole + Plavix.  [name], DO [facility] 11/17/2022, 3:17 PM

## 2022-11-18 MED ORDER — PANTOPRAZOLE SODIUM 40 MG PO TBEC
40.0000 mg | DELAYED_RELEASE_TABLET | Freq: Every day | ORAL | 3 refills | Status: DC
Start: 2022-11-18 — End: 2023-11-10

## 2022-11-18 NOTE — Addendum Note (Signed)
Addended by: Smitty Cords on: 11/18/2022 12:22 PM   Modules accepted: Orders

## 2022-11-18 NOTE — Telephone Encounter (Signed)
Rx sent Pantoprazole.  [name], DO [facility] 11/18/2022, 12:22 PM

## 2022-11-18 NOTE — Telephone Encounter (Signed)
Spoke with patient.  Advised her to stop omeprazole.  Please send in rx for pantoprazole.

## 2022-11-19 ENCOUNTER — Other Ambulatory Visit: Payer: Self-pay

## 2022-11-19 DIAGNOSIS — D72829 Elevated white blood cell count, unspecified: Secondary | ICD-10-CM

## 2022-11-19 DIAGNOSIS — E538 Deficiency of other specified B group vitamins: Secondary | ICD-10-CM

## 2022-11-19 DIAGNOSIS — E611 Iron deficiency: Secondary | ICD-10-CM

## 2022-11-19 DIAGNOSIS — D7589 Other specified diseases of blood and blood-forming organs: Secondary | ICD-10-CM

## 2022-11-19 DIAGNOSIS — Z862 Personal history of diseases of the blood and blood-forming organs and certain disorders involving the immune mechanism: Secondary | ICD-10-CM

## 2022-11-19 DIAGNOSIS — D582 Other hemoglobinopathies: Secondary | ICD-10-CM

## 2022-11-22 ENCOUNTER — Encounter: Payer: Self-pay | Admitting: Oncology

## 2022-11-22 ENCOUNTER — Inpatient Hospital Stay: Payer: 59 | Attending: Oncology

## 2022-11-22 ENCOUNTER — Inpatient Hospital Stay (HOSPITAL_BASED_OUTPATIENT_CLINIC_OR_DEPARTMENT_OTHER): Payer: 59 | Admitting: Oncology

## 2022-11-22 VITALS — BP 180/100 | HR 95 | Temp 96.4°F | Resp 17 | Wt 123.0 lb

## 2022-11-22 DIAGNOSIS — Z806 Family history of leukemia: Secondary | ICD-10-CM | POA: Insufficient documentation

## 2022-11-22 DIAGNOSIS — F17210 Nicotine dependence, cigarettes, uncomplicated: Secondary | ICD-10-CM | POA: Diagnosis not present

## 2022-11-22 DIAGNOSIS — Z803 Family history of malignant neoplasm of breast: Secondary | ICD-10-CM | POA: Insufficient documentation

## 2022-11-22 DIAGNOSIS — D751 Secondary polycythemia: Secondary | ICD-10-CM | POA: Insufficient documentation

## 2022-11-22 DIAGNOSIS — Z862 Personal history of diseases of the blood and blood-forming organs and certain disorders involving the immune mechanism: Secondary | ICD-10-CM

## 2022-11-22 DIAGNOSIS — E538 Deficiency of other specified B group vitamins: Secondary | ICD-10-CM | POA: Diagnosis not present

## 2022-11-22 DIAGNOSIS — Z8042 Family history of malignant neoplasm of prostate: Secondary | ICD-10-CM | POA: Insufficient documentation

## 2022-11-22 DIAGNOSIS — D7589 Other specified diseases of blood and blood-forming organs: Secondary | ICD-10-CM | POA: Diagnosis present

## 2022-11-22 DIAGNOSIS — E611 Iron deficiency: Secondary | ICD-10-CM | POA: Diagnosis not present

## 2022-11-22 DIAGNOSIS — D582 Other hemoglobinopathies: Secondary | ICD-10-CM

## 2022-11-22 DIAGNOSIS — D72829 Elevated white blood cell count, unspecified: Secondary | ICD-10-CM

## 2022-11-22 LAB — CBC WITH DIFFERENTIAL (CANCER CENTER ONLY)
Abs Immature Granulocytes: 0.06 10*3/uL (ref 0.00–0.07)
Basophils Absolute: 0.1 10*3/uL (ref 0.0–0.1)
Basophils Relative: 1 %
Eosinophils Absolute: 0.1 10*3/uL (ref 0.0–0.5)
Eosinophils Relative: 1 %
HCT: 43.9 % (ref 36.0–46.0)
Hemoglobin: 15.2 g/dL — ABNORMAL HIGH (ref 12.0–15.0)
Immature Granulocytes: 0 %
Lymphocytes Relative: 16 %
Lymphs Abs: 2.3 10*3/uL (ref 0.7–4.0)
MCH: 33.3 pg (ref 26.0–34.0)
MCHC: 34.6 g/dL (ref 30.0–36.0)
MCV: 96.3 fL (ref 80.0–100.0)
Monocytes Absolute: 0.8 10*3/uL (ref 0.1–1.0)
Monocytes Relative: 6 %
Neutro Abs: 10.8 10*3/uL — ABNORMAL HIGH (ref 1.7–7.7)
Neutrophils Relative %: 76 %
Platelet Count: 208 10*3/uL (ref 150–400)
RBC: 4.56 MIL/uL (ref 3.87–5.11)
RDW: 12 % (ref 11.5–15.5)
WBC Count: 14.1 10*3/uL — ABNORMAL HIGH (ref 4.0–10.5)
nRBC: 0 % (ref 0.0–0.2)

## 2022-11-22 LAB — IRON AND TIBC
Iron: 160 ug/dL (ref 28–170)
Saturation Ratios: 52 % — ABNORMAL HIGH (ref 10.4–31.8)
TIBC: 311 ug/dL (ref 250–450)
UIBC: 151 ug/dL

## 2022-11-22 LAB — FERRITIN: Ferritin: 277 ng/mL (ref 11–307)

## 2022-11-22 LAB — VITAMIN B12: Vitamin B-12: 519 pg/mL (ref 180–914)

## 2022-11-22 NOTE — Progress Notes (Signed)
Patient here for oncology follow-up appointment, BP 180/100, 2nd check, concerns of back pain

## 2022-11-22 NOTE — Progress Notes (Signed)
Hematology/Oncology Consult note [facility]  Telephone:([phone] [phone]  Patient Care Team: Smitty Cords, DO as PCP - General (Family Medicine)   Name of the patient: Joanna Garcia  [id number]  29-Jan-1976   Date of visit: 11/22/22  Diagnosis- 1.  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  History of iron deficiency 3.  History of polycythemia  Chief complaint/ Reason for visit-routine follow-up of iron and B12 deficiency anemia  Heme/Onc history: patient is a 47 year old female who was evaluated by NP Mignon Pine for polycythemia.Patient has had a macrocytic anemia upon 09/07/2020 and her MCV has been between 10 2-1 27.  Most recently on 01/09/2021 she was noted to have a white count of 16.2, H&H of 19.5/55.7 with an MCV of 106 and a platelet count of 217.  JAK2 mutation testing x1 was inconclusive and the second JAK2 mutation testing was negative.  Patient has had some history of folate deficiency back in 2020 along with severe B12 deficiency.Her hemoglobin improved significantly since then but macrocytosis has persisted.   She is an everyday smoker but does not report any trouble sleeping.  She has some ongoing fatigue.  No history of chronic lung disease.Urinalysis did not show any evidence of hematuria.  T4 level was low normal at 4.9   Plan was to proceed with bone marrow biopsy to a certain if polycythemia is primary or secondary.  2 sessions of phlebotomy were attempted for polycythemia but 500 cc could not be taken out as blood appeared to be "too thick" per infusion.   Before patient could get a bone marrow biopsy she was admitted to [facility] with symptoms of acute stroke and was discharged on Brilinta and aspirin    Interval history-patient is doing well overall and denies any specific complaints at this time.  She is on aspirin and Plavix for secondary prevention of stroke  ECOG PS-  0 Pain scale- 0   Review of systems- Review of Systems  Constitutional:  Negative for chills, fever, malaise/fatigue and weight loss.  HENT:  Negative for congestion, ear discharge and nosebleeds.   Eyes:  Negative for blurred vision.  Respiratory:  Negative for cough, hemoptysis, sputum production, shortness of breath and wheezing.   Cardiovascular:  Negative for chest pain, palpitations, orthopnea and claudication.  Gastrointestinal:  Negative for abdominal pain, blood in stool, constipation, diarrhea, heartburn, melena, nausea and vomiting.  Genitourinary:  Negative for dysuria, flank pain, frequency, hematuria and urgency.  Musculoskeletal:  Negative for back pain, joint pain and myalgias.  Skin:  Negative for rash.  Neurological:  Negative for dizziness, tingling, focal weakness, seizures, weakness and headaches.  Endo/Heme/Allergies:  Does not bruise/bleed easily.  Psychiatric/Behavioral:  Negative for depression and suicidal ideas. The patient does not have insomnia.       No Known Allergies   Past Medical History:  Diagnosis Date   DVT (deep venous thrombosis) (HCC)    GERD (gastroesophageal reflux disease)    Hypertension    Polycythemia    Polyneuropathy    Stroke [facility])      Past Surgical History:  Procedure Laterality Date   COLONOSCOPY N/A 03/16/2019   Procedure: COLONOSCOPY;  Surgeon: Toledo, Boykin Nearing, MD;  Location: [facility] ENDOSCOPY;  Service: Gastroenterology;  Laterality: N/A;   ESOPHAGOGASTRODUODENOSCOPY N/A 03/16/2019   Procedure: ESOPHAGOGASTRODUODENOSCOPY (EGD);  Surgeon: Toledo, Boykin Nearing, MD;  Location: [facility] ENDOSCOPY;  Service: Gastroenterology;  Laterality: N/A;   IR ANGIO INTRA EXTRACRAN  SEL COM CAROTID INNOMINATE UNI L MOD SED  02/09/2021   IR INTRAVSC STENT CERV CAROTID W/EMB-PROT MOD SED INCL ANGIO  02/09/2021   IR PERCUTANEOUS ART THROMBECTOMY/INFUSION INTRACRANIAL INC DIAG ANGIO  02/09/2021   IR US GUIDE VASC ACCESS RIGHT  02/09/2021   NO PAST  SURGERIES     RADIOLOGY WITH ANESTHESIA N/A 02/09/2021   Procedure: IR WITH ANESTHESIA;  Surgeon: Radiologist, Medication, MD;  Location: [facility] OR;  Service: Radiology;  Laterality: N/A;    Social History   Socioeconomic History   Marital status: Divorced    Spouse name: Not on file   Number of children: Not on file   Years of education: Not on file   Highest education level: Not on file  Occupational History   Not on file  Tobacco Use   Smoking status: Every Day    Packs/day: .75    Types: Cigarettes    Start date: 06/21/1992   Smokeless tobacco: Never  Vaping Use   Vaping Use: Never used  Substance and Sexual Activity   Alcohol use: Yes    Comment: 24 Oz daily   Drug use: Not Currently    Types: Marijuana   Sexual activity: Not Currently  Other Topics Concern   Not on file  Social History Narrative   Lives at home with mother and 2 dogs Charlie and Clauie    Drives   Social Determinants of Health   Financial Resource Strain: Not on file  Food Insecurity: No Food Insecurity (02/20/2021)   Hunger Vital Sign    Worried About Running Out of Food in the Last Year: Never true    Ran Out of Food in the Last Year: Never true  Transportation Needs: No Transportation Needs (02/20/2021)   PRAPARE - Administrator, Civil Service (Medical): No    Lack of Transportation (Non-Medical): No  Physical Activity: Not on file  Stress: No Stress Concern Present (02/20/2021)   [employer] of Occupational Health - Occupational Stress Questionnaire    Feeling of Stress : Only a little  Social Connections: Not on file  Intimate Partner Violence: Not At Risk (02/20/2021)   Humiliation, Afraid, Rape, and Kick questionnaire    Fear of Current or Ex-Partner: No    Emotionally Abused: No    Physically Abused: No    Sexually Abused: No    Family History  Problem Relation Age of Onset   Hypertension Mother    Hyperlipidemia Mother    Heart disease Mother    COPD Mother     Hemochromatosis Mother    Hypertension Father    Hyperlipidemia Father    Cancer Father        prostate   Hemochromatosis Maternal Aunt    Hemochromatosis Maternal Aunt    Hemochromatosis Maternal Uncle    Leukemia Paternal Uncle    Cancer Paternal Grandmother    Breast cancer Paternal Grandmother      Current Outpatient Medications:    aspirin 81 MG chewable tablet, Chew and swallow 1 tablet (81 mg total) by mouth daily., Disp: 30 tablet, Rfl: 2   atorvastatin (LIPITOR) 80 MG tablet, Take 80 mg by mouth daily., Disp: , Rfl:    B-D 3CC LUER-LOK SYR 25GX1" 25G X 1" 3 ML MISC, , Disp: , Rfl:    clopidogrel (PLAVIX) 75 MG tablet, Take 1 tablet (75 mg total) by mouth daily., Disp: 30 tablet, Rfl: 2   cyanocobalamin (,VITAMIN B-12,) 1000 MCG/ML injection, Inject into the muscle.,  Disp: , Rfl:    folic acid (FOLVITE) 1 MG tablet, TAKE 2 TABLETS BY MOUTH DAILY., Disp: 30 tablet, Rfl: 0   gabapentin (NEURONTIN) 300 MG capsule, Take 3 caps = 900mg  dose in morning and afternoon and take 4 caps = 1200mg  in evening. Max dose is 10 capsules in 24 hours., Disp: 300 capsule, Rfl: 2   meloxicam (MOBIC) 15 MG tablet, Take 1 tablet (15 mg total) by mouth daily as needed for pain (back pain)., Disp: 30 tablet, Rfl: 2   ondansetron (ZOFRAN-ODT) 4 MG disintegrating tablet, Take 1 tablet (4 mg total) by mouth every 8 (eight) hours as needed for nausea or vomiting., Disp: 30 tablet, Rfl: 2   tiZANidine (ZANAFLEX) 4 MG tablet, Take 1 tablet (4 mg total) by mouth every 8 (eight) hours as needed for muscle spasms., Disp: 90 tablet, Rfl: 2   lisinopril (ZESTRIL) 5 MG tablet, Take 5 mg by mouth daily., Disp: , Rfl:    pantoprazole (PROTONIX) 40 MG tablet, Take 1 tablet (40 mg total) by mouth daily before breakfast. (Patient not taking: Reported on 11/22/2022), Disp: 90 tablet, Rfl: 3  Physical exam:  Vitals:   11/22/22 0927 11/22/22 0934  BP: (!) 168/98 (!) 180/100  Pulse: 95   Resp: 17   Temp: (!) 96.4 F (35.8  C)   TempSrc: Tympanic   SpO2: 99%   Weight: 123 lb (55.8 kg)    Physical Exam Cardiovascular:     Rate and Rhythm: Normal rate and regular rhythm.     Heart sounds: Normal heart sounds.  Pulmonary:     Effort: Pulmonary effort is normal.     Breath sounds: Normal breath sounds.  Abdominal:     General: Bowel sounds are normal.     Palpations: Abdomen is soft.  Skin:    General: Skin is warm and dry.  Neurological:     Mental Status: She is alert and oriented to person, place, and time.         Latest Ref Rng & Units 02/11/2021    4:36 AM  CMP  Glucose 70 - 99 mg/dL 93   BUN 6 - 20 mg/dL 9   Creatinine 1.61 - 0.96 mg/dL 0.45   Sodium 409 - 811 mmol/L 141   Potassium 3.5 - 5.1 mmol/L 3.8   Chloride 98 - 111 mmol/L 106   CO2 22 - 32 mmol/L 25   Calcium 8.9 - 10.3 mg/dL 8.5       Latest Ref Rng & Units 11/22/2022    9:13 AM  CBC  WBC 4.0 - 10.5 K/uL 14.1   Hemoglobin 12.0 - 15.0 g/dL 91.4   Hematocrit 78.2 - 46.0 % 43.9   Platelets 150 - 400 K/uL 208      Assessment and plan- Patient is a 47 y.o. female who is here for routine follow-up of iron B12 deficiency anemia  Hemoglobin is normal at 15.2.She last received on a ferritin January 2024.  She self administers monthly B12 injections.  Ferritin levels are normal at 277 with an iron saturation of 52%.  She does not require any IV iron at this time.  B12 levels are pending.  I will repeat CBC ferritin and iron studies and B12 levels in 6 months in 1 year and see her back in 1 year   Visit Diagnosis 1. B12 deficiency   2. Iron deficiency      Dr. Owens Shark, MD, MPH [facility] at [facility] [phone] 11/22/2022  12:55 PM

## 2022-11-25 ENCOUNTER — Encounter: Payer: Self-pay | Admitting: Family Medicine

## 2022-11-25 ENCOUNTER — Ambulatory Visit
Admission: RE | Admit: 2022-11-25 | Discharge: 2022-11-25 | Disposition: A | Discharge status: Home / Self Care | Payer: 59 | Attending: Family Medicine | Admitting: Family Medicine

## 2022-11-25 ENCOUNTER — Ambulatory Visit
Admission: RE | Admit: 2022-11-25 | Discharge: 2022-11-25 | Disposition: A | Discharge status: Home / Self Care | Payer: 59 | Source: Ambulatory Visit | Attending: Family Medicine | Admitting: Family Medicine

## 2022-11-25 ENCOUNTER — Ambulatory Visit (INDEPENDENT_AMBULATORY_CARE_PROVIDER_SITE_OTHER): Payer: 59 | Admitting: Family Medicine

## 2022-11-25 VITALS — BP 180/120 | HR 78 | Ht 65.0 in | Wt 124.0 lb

## 2022-11-25 DIAGNOSIS — M546 Pain in thoracic spine: Secondary | ICD-10-CM | POA: Diagnosis not present

## 2022-11-25 DIAGNOSIS — M542 Cervicalgia: Secondary | ICD-10-CM | POA: Insufficient documentation

## 2022-11-25 DIAGNOSIS — G8929 Other chronic pain: Secondary | ICD-10-CM | POA: Insufficient documentation

## 2022-11-25 MED ORDER — DICLOFENAC SODIUM 1 % EX GEL
2.0000 g | Freq: Two times a day (BID) | CUTANEOUS | 0 refills | Status: DC | PRN
Start: 2022-11-25 — End: 2023-01-25

## 2022-11-25 MED ORDER — PREDNISONE 50 MG PO TABS
50.0000 mg | ORAL_TABLET | Freq: Every day | ORAL | 0 refills | Status: DC
Start: 2022-11-25 — End: 2022-12-22

## 2022-11-25 MED ORDER — LIDOCAINE 5 % EX PTCH
1.0000 | MEDICATED_PATCH | Freq: Two times a day (BID) | CUTANEOUS | 2 refills | Status: DC
Start: 2022-11-25 — End: 2023-01-25

## 2022-11-25 NOTE — Progress Notes (Signed)
     Primary Care / Sports Medicine Office Visit  Patient Information:  Patient ID: Joanna Garcia, female DOB: 04/06/1976 Age: 47 y.o. MRN: [id number]   Joanna Garcia is a pleasant 48 y.o. female presenting with the following:  Chief Complaint  Patient presents with   Back Pain    Vitals:   11/25/22 0932  BP: (!) 180/120  Pulse: 78  SpO2: 98%   Vitals:   11/25/22 0932  Weight: 124 lb (56.2 kg)  Height: 5\' 5"  (1.651 m)   Body mass index is 20.63 kg/m.  No results found.   Independent interpretation of notes and tests performed by another provider:   None  Procedures performed:   None  Pertinent History, Exam, Impression, and Recommendations:   Joanna Garcia was seen today for back pain.  Chronic left-sided thoracic back pain Overview: Focality to the region that includes the inferior aspect of the inferior trapezius medially, medial component of the latissimus dorsi posteriorly, proximal aspects of the serratus posterior inferior, and erector spinae.  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  Patient denies any radiation into the extremities, no weakness. X-ray findings with degenerative changes noted at focal regions on cervical and lumbar spine regions. Exam shows focality to muscle groups mentioned.  Plan: - Prednisone course - Followed by topical diclofenac - Nightly gabapentin - Tizanidine PRN - Formal PT - Return in 6 weeks  Orders: -     Ambulatory referral to Physical Therapy -     Diclofenac Sodium; Apply 2 g topically 2 (two) times daily as needed. To affected joint.  Dispense: 100 g; Refill: 0 -     predniSONE; Take 1 tablet (50 mg total) by mouth daily.  Dispense: 7 tablet; Refill: 0 -     Lidocaine; Place 1 patch onto the skin every 12 (twelve) hours. Remove & Discard patch within 12 hours or as directed by MD   Dispense: 30 patch; Refill: 2     Orders & Medications Meds ordered this encounter  Medications   diclofenac Sodium (VOLTAREN) 1 % GEL    Sig: Apply 2 g topically 2 (two) times daily as needed. To affected joint.    Dispense:  100 g    Refill:  0   predniSONE (DELTASONE) 50 MG tablet    Sig: Take 1 tablet (50 mg total) by mouth daily.    Dispense:  7 tablet    Refill:  0   lidocaine (LIDODERM) 5 %    Sig: Place 1 patch onto the skin every 12 (twelve) hours. Remove & Discard patch within 12 hours or as directed by MD    Dispense:  30 patch    Refill:  2   Orders Placed This Encounter  Procedures   Ambulatory referral to Physical Therapy     No follow-ups on file.     Jerrol Banana, MD, [facility]   Primary Care Sports Medicine Primary Care and Sports Medicine at [facility]

## 2022-11-25 NOTE — Assessment & Plan Note (Addendum)
Chronic atraumatic pain localizing to the left inferior thoracic region despite treatments with skeletal muscle relaxers, chiropractic care, massage therapy.  Patient denies any radiation into the extremities, no weakness. X-ray findings with degenerative changes noted at focal regions on cervical and lumbar spine regions. Exam shows focality to muscle groups mentioned.  Plan: - Prednisone course - Followed by topical diclofenac - Nightly gabapentin - Tizanidine PRN - Formal PT - Return in 6 weeks

## 2022-12-08 ENCOUNTER — Ambulatory Visit (INDEPENDENT_AMBULATORY_CARE_PROVIDER_SITE_OTHER): Payer: 59 | Admitting: Internal Medicine

## 2022-12-08 ENCOUNTER — Encounter: Payer: Self-pay | Admitting: Internal Medicine

## 2022-12-08 VITALS — BP 206/93 | HR 90 | Temp 96.6°F | Wt 123.0 lb

## 2022-12-08 DIAGNOSIS — I6930 Unspecified sequelae of cerebral infarction: Secondary | ICD-10-CM | POA: Diagnosis not present

## 2022-12-08 DIAGNOSIS — I160 Hypertensive urgency: Secondary | ICD-10-CM

## 2022-12-08 DIAGNOSIS — B370 Candidal stomatitis: Secondary | ICD-10-CM | POA: Diagnosis not present

## 2022-12-08 MED ORDER — AMLODIPINE-OLMESARTAN 5-20 MG PO TABS: 1.0000 | ORAL_TABLET | Freq: Every day | ORAL | 0 refills | Status: DC

## 2022-12-08 MED ORDER — NYSTATIN 100000 UNIT/ML MT SUSP: 5.0000 mL | Freq: Four times a day (QID) | OROMUCOSAL | 0 refills | Status: AC

## 2022-12-08 MED ORDER — FLUCONAZOLE 150 MG PO TABS: 150.0000 mg | ORAL_TABLET | Freq: Once | ORAL | 0 refills | Status: AC

## 2022-12-08 NOTE — Patient Instructions (Signed)

## 2022-12-08 NOTE — Progress Notes (Signed)
Subjective:    Patient ID: Joanna Garcia, female    DOB: 01-25-1976, 47 y.o.   MRN: [id number]  HPI  Patient presents to clinic today with complaint of red and white bumps in her mouth.  She noticed this 2 weeks ago.  She also reports she has had a persistent sore throat.  She denies runny nose, nasal congestion, ear pain, cough or shortness of breath.  She does not use any steroid inhalers.  She reports she was on prednisone 1 week ago but had this issue with her mouth prior.  She has tried gargling with salt water with minimal relief of symptoms.  Of note, her BP today is 186/102.  She reports she is unable to tolerate lisinopril.  She reports she is also been on HCTZ in the past.  She has a history of HTN with a prior stroke.  Review of Systems  Past Medical History:  Diagnosis Date   DVT (deep venous thrombosis) (HCC)    GERD (gastroesophageal reflux disease)    Hypertension    Polycythemia    Polyneuropathy    Stroke [facility])     Current Outpatient Medications  Medication Sig Dispense Refill   aspirin 81 MG chewable tablet Chew and swallow 1 tablet (81 mg total) by mouth daily. 30 tablet 2   atorvastatin (LIPITOR) 80 MG tablet Take 80 mg by mouth daily.     B-D 3CC LUER-LOK SYR 25GX1" 25G X 1" 3 ML MISC      clopidogrel (PLAVIX) 75 MG tablet Take 1 tablet (75 mg total) by mouth daily. 30 tablet 2   cyanocobalamin (,VITAMIN B-12,) 1000 MCG/ML injection Inject into the muscle.     diclofenac Sodium (VOLTAREN) 1 % GEL Apply 2 g topically 2 (two) times daily as needed. To affected joint. 100 g 0   folic acid (FOLVITE) 1 MG tablet TAKE 2 TABLETS BY MOUTH DAILY. 30 tablet 0   gabapentin (NEURONTIN) 300 MG capsule Take 3 caps = 900mg  dose in morning and afternoon and take 4 caps = 1200mg  in evening. Max dose is 10 capsules in 24 hours. 300 capsule 2   lidocaine (LIDODERM) 5 % Place 1 patch onto the skin every 12 (twelve) hours. Remove & Discard patch within 12 hours or as directed by  MD 30 patch 2   lisinopril (ZESTRIL) 5 MG tablet Take 5 mg by mouth daily. (Patient not taking: Reported on 11/25/2022)     meloxicam (MOBIC) 15 MG tablet Take 1 tablet (15 mg total) by mouth daily as needed for pain (back pain). 30 tablet 2   ondansetron (ZOFRAN-ODT) 4 MG disintegrating tablet Take 1 tablet (4 mg total) by mouth every 8 (eight) hours as needed for nausea or vomiting. 30 tablet 2   pantoprazole (PROTONIX) 40 MG tablet Take 1 tablet (40 mg total) by mouth daily before breakfast. (Patient not taking: Reported on 11/22/2022) 90 tablet 3   predniSONE (DELTASONE) 50 MG tablet Take 1 tablet (50 mg total) by mouth daily. 7 tablet 0   tiZANidine (ZANAFLEX) 4 MG tablet Take 1 tablet (4 mg total) by mouth every 8 (eight) hours as needed for muscle spasms. 90 tablet 2   No current facility-administered medications for this visit.    No Known Allergies  Family History  Problem Relation Age of Onset   Hypertension Mother    Hyperlipidemia Mother    Heart disease Mother    COPD Mother    Hemochromatosis Mother    Hypertension  Father    Hyperlipidemia Father    Cancer Father        prostate   Hemochromatosis Maternal Aunt    Hemochromatosis Maternal Aunt    Hemochromatosis Maternal Uncle    Leukemia Paternal Uncle    Cancer Paternal Grandmother    Breast cancer Paternal Grandmother     Social History   Socioeconomic History   Marital status: Divorced    Spouse name: Not on file   Number of children: Not on file   Years of education: Not on file   Highest education level: 11th grade  Occupational History   Not on file  Tobacco Use   Smoking status: Every Day    Packs/day: .75    Types: Cigarettes    Start date: 06/21/1992   Smokeless tobacco: Never  Vaping Use   Vaping Use: Never used  Substance and Sexual Activity   Alcohol use: Yes    Comment: 24 Oz daily   Drug use: Not Currently    Types: Marijuana   Sexual activity: Not Currently  Other Topics Concern   Not on  file  Social History Narrative   Lives at home with mother and 2 dogs Charlie and Clauie    Drives   Social Determinants of Health   Financial Resource Strain: Medium Risk (12/06/2022)   Overall Financial Resource Strain (CARDIA)    Difficulty of Paying Living Expenses: Somewhat hard  Food Insecurity: No Food Insecurity (12/06/2022)   Hunger Vital Sign    Worried About Running Out of Food in the Last Year: Never true    Ran Out of Food in the Last Year: Never true  Transportation Needs: No Transportation Needs (12/06/2022)   PRAPARE - Administrator, Civil Service (Medical): No    Lack of Transportation (Non-Medical): No  Physical Activity: Sufficiently Active (12/06/2022)   Exercise Vital Sign    Days of Exercise per Week: 5 days    Minutes of Exercise per Session: 30 min  Stress: No Stress Concern Present (12/06/2022)   [employer] of Occupational Health - Occupational Stress Questionnaire    Feeling of Stress : Not at all  Social Connections: Moderately Isolated (12/06/2022)   Social Connection and Isolation Panel [NHANES]    Frequency of Communication with Friends and Family: More than three times a week    Frequency of Social Gatherings with Friends and Family: Three times a week    Attends Religious Services: 1 to 4 times per year    Active Member of Clubs or Organizations: No    Attends Banker Meetings: Not on file    Marital Status: Divorced  Intimate Partner Violence: Not At Risk (02/20/2021)   Humiliation, Afraid, Rape, and Kick questionnaire    Fear of Current or Ex-Partner: No    Emotionally Abused: No    Physically Abused: No    Sexually Abused: No     Constitutional: Denies fever, malaise, fatigue, headache or abrupt weight changes.  HEENT: Patient reports sore throat and bumps in mouth.  Denies eye pain, eye redness, ear pain, ringing in the ears, wax buildup, runny nose, nasal congestion, bloody nose. Respiratory: Denies difficulty  breathing, shortness of breath, cough or sputum production.   Cardiovascular: Denies chest pain, chest tightness, palpitations or swelling in the hands or feet.  Gastrointestinal: Denies abdominal pain, bloating, constipation, diarrhea or blood in the stool.  Skin: Denies redness, rashes, lesions or ulcercations.  Neurological: Denies dizziness, difficulty with memory, difficulty with  speech or problems with balance and coordination.   No other specific complaints in a complete review of systems (except as listed in HPI above).     Objective:   Physical Exam  BP (!) 206/93 (BP Location: Left Arm, Patient Position: Sitting, Cuff Size: Normal)   Pulse 90   Temp (!) 96.6 F (35.9 C) (Temporal)   Wt 123 lb (55.8 kg)   SpO2 97%   BMI 20.47 kg/m   Wt Readings from Last 3 Encounters:  11/25/22 124 lb (56.2 kg)  11/22/22 123 lb (55.8 kg)  10/19/22 126 lb (57.2 kg)    General: Appears her stated age, well developed, well nourished in NAD. Skin: Warm, dry and intact. No rashes noted. HEENT: Head: normal shape and size; Eyes: sclera white, no icterus, conjunctiva pink, PERRLA and EOMs intact; Throat/Mouth: Teeth present, mucosa erythematous and moist, white coating noted of tongue, maculopapular bumps noted of the buccal mucosa and posterior pharynx. Neck: No adenopathy noted. Cardiovascular: Normal rate and rhythm.  Pulmonary/Chest: Normal effort and positive vesicular breath sounds. No respiratory distress. No wheezes, rales or ronchi noted.  Musculoskeletal: No difficulty with gait.  Neurological: Alert and oriented.  Coordination normal.      BMET    Component Value Date/Time   NA 141 02/11/2021 0436   K 3.8 02/11/2021 0436   CL 106 02/11/2021 0436   CO2 25 02/11/2021 0436   GLUCOSE 93 02/11/2021 0436   BUN 9 02/11/2021 0436   CREATININE 0.76 02/11/2021 0436   CALCIUM 8.5 (L) 02/11/2021 0436   GFRNONAA >60 02/11/2021 0436   GFRAA >60 06/05/2019 1710    Lipid Panel      Component Value Date/Time   CHOL 173 02/10/2021 0711   TRIG 129 02/10/2021 0711   TRIG 133 02/10/2021 0711   HDL 33 (L) 02/10/2021 0711   CHOLHDL 5.2 02/10/2021 0711   VLDL 26 02/10/2021 0711   LDLCALC 114 (H) 02/10/2021 0711    CBC    Component Value Date/Time   WBC 14.1 (H) 11/22/2022 0913   WBC 11.8 (H) 07/05/2022 1011   RBC 4.56 11/22/2022 0913   HGB 15.2 (H) 11/22/2022 0913   HCT 43.9 11/22/2022 0913   PLT 208 11/22/2022 0913   MCV 96.3 11/22/2022 0913   MCH 33.3 11/22/2022 0913   MCHC 34.6 11/22/2022 0913   RDW 12.0 11/22/2022 0913   LYMPHSABS 2.3 11/22/2022 0913   MONOABS 0.8 11/22/2022 0913   EOSABS 0.1 11/22/2022 0913   BASOSABS 0.1 11/22/2022 0913    Hgb A1C Lab Results  Component Value Date   HGBA1C 6.2 (H) 02/10/2021            Assessment & Plan:   Oral Thrush:  Rx for Diflucan 150 mg p.o. x 1 Nystatin suspension 5 mL 4 times daily for 5 to 7 days  Hypertensive Urgency, History of Stroke:  Rx for amlodipine-olmesartan 5-20 mg daily Reinforced DASH diet  Follow-up with your PCP as previously scheduled Nicki Reaper, NP

## 2022-12-09 ENCOUNTER — Encounter: Payer: Self-pay | Admitting: Family Medicine

## 2022-12-09 ENCOUNTER — Other Ambulatory Visit: Payer: Self-pay | Admitting: Family Medicine

## 2022-12-09 DIAGNOSIS — G8929 Other chronic pain: Secondary | ICD-10-CM

## 2022-12-09 NOTE — Telephone Encounter (Signed)
Please advise PT-Phone: [phone]

## 2022-12-10 ENCOUNTER — Encounter: Payer: Self-pay | Admitting: Family Medicine

## 2022-12-10 ENCOUNTER — Ambulatory Visit (INDEPENDENT_AMBULATORY_CARE_PROVIDER_SITE_OTHER): Payer: 59 | Admitting: Family Medicine

## 2022-12-10 VITALS — BP 160/100 | HR 74 | Ht 65.0 in | Wt 122.0 lb

## 2022-12-10 DIAGNOSIS — M546 Pain in thoracic spine: Secondary | ICD-10-CM

## 2022-12-10 DIAGNOSIS — G8929 Other chronic pain: Secondary | ICD-10-CM | POA: Diagnosis not present

## 2022-12-10 MED ORDER — DULOXETINE HCL 30 MG PO CPEP
ORAL_CAPSULE | ORAL | 0 refills | Status: DC
Start: 2022-12-10 — End: 2023-01-25

## 2022-12-10 NOTE — Patient Instructions (Addendum)
-   Start duloxetine (Cymbalta), 1 capsule daily x 2 weeks - After 2 weeks, increase to 2 capsules daily - Continue remaining medications - Maintain follow-up with spine group once scheduled - Contact us for any question/concerns between now and then

## 2022-12-10 NOTE — Assessment & Plan Note (Signed)
Patient presents for follow-up to chronic left thoracic pain, describes no response from previous regimen of prednisone, topical diclofenac, and nightly gabapentin, and tizanidine as needed.  Has not been contacted regarding PT or spine group as of yet.  She does report some overall improvement in other prior symptoms from gabapentin.  Given her limited response to previous course, discussed possibility of comorbid chronic pain conditions, and utility of addressing focal pain today.  Plan as follows: - Patient elected to proceed with trigger point injections throughout the areas of maximal pain - Start duloxetine with titration to 60 mg after 2 weeks - Await MRI results from orders placed - Encourage patient to proceed with PT as well as spine group once scheduled

## 2022-12-10 NOTE — Progress Notes (Signed)
     Primary Care / Sports Medicine Office Visit  Patient Information:  Patient ID: Joanna Garcia, female DOB: [birth year] Age: 47 y.o. MRN: [id number]   Joanna Garcia is a pleasant 47 y.o. female presenting with the following:  Chief Complaint  Patient presents with   Back Pain    Injections trigger point    Vitals:   12/10/22 0911  BP: (!) 160/100  Pulse: 74  SpO2: 98%   Vitals:   12/10/22 0911  Weight: 122 lb (55.3 kg)  Height: 5\' 5"  (1.651 m)   Body mass index is 20.3 kg/m.     Independent interpretation of notes and tests performed by another provider:   None  Procedures performed:   Multiple trigger point injections were performed at the site of maximal tenderness using 1% plain Lidocaine and Kenalog. This was well tolerated well.   Pertinent History, Exam, Impression, and Recommendations:   Joanna Garcia was seen today for back pain.  Chronic left-sided thoracic back pain Overview: Focality to the region that includes the inferior aspect of the inferior trapezius medially, medial component of the latissimus dorsi posteriorly, proximal aspects of the serratus posterior inferior, and erector spinae.  Assessment & Plan: Patient presents for follow-up to chronic left thoracic pain, describes no response from previous regimen of prednisone, topical diclofenac, and nightly gabapentin, and tizanidine as needed.  Has not been contacted regarding PT or spine group as of yet.  She does report some overall improvement in other prior symptoms from gabapentin.  Given her limited response to previous course, discussed possibility of comorbid chronic pain conditions, and utility of addressing focal pain today.  Plan as follows: - Patient elected to proceed with trigger point injections throughout the areas of maximal pain - Start duloxetine with titration to 60 mg after 2 weeks - Await MRI results from orders placed - Encourage patient to proceed with PT as well as spine  group once scheduled   Orders: -     DULoxetine HCl; Take 1 capsule (30 mg total) by mouth every evening for 14 days, THEN 2 capsules (60 mg total) every evening for 16 days. CONTACT us FOR REFILL AT 60 MG AFTER THIS RX.  Dispense: 46 capsule; Refill: 0     Orders & Medications Meds ordered this encounter  Medications   DULoxetine (CYMBALTA) 30 MG capsule    Sig: Take 1 capsule (30 mg total) by mouth every evening for 14 days, THEN 2 capsules (60 mg total) every evening for 16 days. CONTACT us FOR REFILL AT 60 MG AFTER THIS RX.    Dispense:  46 capsule    Refill:  0   No orders of the defined types were placed in this encounter.    No follow-ups on file.     Jerrol Banana, MD, [facility]   Primary Care Sports Medicine Primary Care and Sports Medicine at [facility]

## 2022-12-15 ENCOUNTER — Ambulatory Visit
Admission: RE | Admit: 2022-12-15 | Discharge: 2022-12-15 | Disposition: A | Discharge status: Home / Self Care | Payer: 59 | Source: Ambulatory Visit | Attending: Family Medicine | Admitting: Family Medicine

## 2022-12-15 DIAGNOSIS — G8929 Other chronic pain: Secondary | ICD-10-CM | POA: Insufficient documentation

## 2022-12-15 DIAGNOSIS — M5124 Other intervertebral disc displacement, thoracic region: Secondary | ICD-10-CM | POA: Diagnosis not present

## 2022-12-15 DIAGNOSIS — M546 Pain in thoracic spine: Secondary | ICD-10-CM | POA: Insufficient documentation

## 2022-12-15 DIAGNOSIS — M5134 Other intervertebral disc degeneration, thoracic region: Secondary | ICD-10-CM | POA: Diagnosis not present

## 2022-12-22 ENCOUNTER — Telehealth: Payer: Self-pay | Admitting: Family Medicine

## 2022-12-22 ENCOUNTER — Encounter: Payer: Self-pay | Admitting: Family Medicine

## 2022-12-22 ENCOUNTER — Ambulatory Visit (INDEPENDENT_AMBULATORY_CARE_PROVIDER_SITE_OTHER): Payer: 59 | Admitting: Family Medicine

## 2022-12-22 VITALS — BP 131/82 | HR 101 | Temp 98.3°F | Resp 17 | Ht 65.0 in | Wt 120.0 lb

## 2022-12-22 DIAGNOSIS — I10 Essential (primary) hypertension: Secondary | ICD-10-CM

## 2022-12-22 DIAGNOSIS — E785 Hyperlipidemia, unspecified: Secondary | ICD-10-CM

## 2022-12-22 DIAGNOSIS — R7309 Other abnormal glucose: Secondary | ICD-10-CM | POA: Diagnosis not present

## 2022-12-22 DIAGNOSIS — I6930 Unspecified sequelae of cerebral infarction: Secondary | ICD-10-CM

## 2022-12-22 MED ORDER — HYDROCHLOROTHIAZIDE 12.5 MG PO TABS
12.5000 mg | ORAL_TABLET | Freq: Every day | ORAL | 1 refills | Status: DC
Start: 2022-12-22 — End: 2023-01-20

## 2022-12-22 MED ORDER — ATORVASTATIN CALCIUM 80 MG PO TABS: 80.0000 mg | ORAL_TABLET | Freq: Every day | ORAL | 1 refills | Status: DC

## 2022-12-22 MED ORDER — LISINOPRIL 10 MG PO TABS
10.0000 mg | ORAL_TABLET | Freq: Every day | ORAL | 1 refills | Status: DC
Start: 2022-12-22 — End: 2022-12-22

## 2022-12-22 NOTE — Telephone Encounter (Signed)
Called patient. I advised her that I cannot rx Furosemide 40mg  daily as this is not a recommended Hypertension therapy for her.  I advised instead, we can try Hydrochlorothiazide 12.5mg  daily, as monotherapy.  She took it in the past with Lisinopril. We will DC Lisinopril now.  Follow up if questions or if BP issues still arise.  [name], DO [facility] 12/22/2022, 6:16 PM

## 2022-12-22 NOTE — Telephone Encounter (Signed)
Patient request refill on Lasix 40 MG.  Medication is not on current list, routing for approval.

## 2022-12-22 NOTE — Assessment & Plan Note (Addendum)
Issue with recent severe HYPERTENSION then improved, she had intolerance of med Amlodipine-Olmesartan Now improved BP but did better on Lisinopril - Home BP readings  History of CVA   Plan:  1. EDIT - new rx hydrochlorothiazide 12.5mg  daily, instead of previously ordered Lisinopril 10mg  daily - see phone note patient called back after visit she requested wrong med not lisinopril, she asked for furosemide 40mg  but I advised we cannot use this as monotherapy.   2. Encourage improved lifestyle - low sodium diet, regular exercise 3. Continue monitor BP outside office, bring readings to next visit, if persistently >140/90 or new symptoms notify office sooner

## 2022-12-22 NOTE — Assessment & Plan Note (Signed)
History of CVA Start ACEi On ASA Statin

## 2022-12-22 NOTE — Telephone Encounter (Signed)
Medication Refill - Medication: Lasix 40 MG  Pt stated she saw Dr. Kirtland Bouchard today and asked him to fill the wrong medication. She said she told him lisinopril (ZESTRIL) 10 MG tablet but she meant Lasix 40 MG.  Has the patient contacted their pharmacy? No. (Agent: If no, request that the patient contact the pharmacy for the refill. If patient does not wish to contact the pharmacy document the reason why and proceed with request.)   Preferred Pharmacy (with phone number or street name):  [facility] [place], [place] - [address]  [address] [place] [address]  Phone: [phone] Fax: [phone]  Hours: Not open 24 hours   Has the patient been seen for an appointment in the last year OR does the patient have an upcoming appointment? Yes.    Agent: Please be advised that RX refills may take up to 3 business days. We ask that you follow-up with your pharmacy.

## 2022-12-22 NOTE — Progress Notes (Addendum)
Subjective:    Patient ID: Joanna Garcia, female    DOB: 10-Oct-1975, 47 y.o.   MRN: [id number]  Joanna Garcia is a 47 y.o. female presenting on 12/22/2022 for Hypertension (Pt state it she discontinued the medication after three days off taking it. She said it made her feel terrible and dropped her blood pressure to low 101/57.)   HPI  CHRONIC HTN: Reports had issue with prior BP med started earlier in June, was AMlodipine-Olmesartan, she felt bad and had lower BP prefer to change meds. Now BP improved off med. She was taking Lisinopril and HCTZ before without problem Current Meds - Furosemide 40mg  daily - she was taking from mother Denies CP, dyspnea, HA, edema, dizziness / lightheadedness  Hyperlipidemia History of CVA Needs re order Statin   Chronic Thoracic Mid Back Pain Pending MRI result per Sports Med Dr Ashley Royalty      12/22/2022    9:17 AM 02/20/2021    3:27 PM  Depression screen PHQ 2/9  Decreased Interest 0 0  Down, Depressed, Hopeless 0 0  PHQ - 2 Score 0 0  Altered sleeping 0   Tired, decreased energy 0   Change in appetite 0   Feeling bad or failure about yourself  0   Trouble concentrating 0   Moving slowly or fidgety/restless 0   Suicidal thoughts 0   PHQ-9 Score 0   Difficult doing work/chores Not difficult at all     Social History   Tobacco Use   Smoking status: Every Day    Packs/day: .75    Types: Cigarettes    Start date: 06/21/1992   Smokeless tobacco: Never  Vaping Use   Vaping Use: Never used  Substance Use Topics   Alcohol use: Yes    Comment: 24 Oz daily   Drug use: Not Currently    Types: Marijuana    Review of Systems Per HPI unless specifically indicated above     Objective:    BP 131/82 (BP Location: Right Arm, Patient Position: Sitting, Cuff Size: Normal)   Pulse (!) 101   Temp 98.3 F (36.8 C) (Oral)   Resp 17   Ht 5\' 5"  (1.651 m)   Wt 120 lb (54.4 kg)   SpO2 97%   BMI 19.97 kg/m   Wt Readings from Last 3  Encounters:  12/22/22 120 lb (54.4 kg)  12/10/22 122 lb (55.3 kg)  12/08/22 123 lb (55.8 kg)    Physical Exam Vitals and nursing note reviewed.  Constitutional:      General: She is not in acute distress.    Appearance: Normal appearance. She is well-developed. She is not diaphoretic.     Comments: Well-appearing, comfortable, cooperative  HENT:     Head: Normocephalic and atraumatic.  Eyes:     General:        Right eye: No discharge.        Left eye: No discharge.     Conjunctiva/sclera: Conjunctivae normal.  Cardiovascular:     Rate and Rhythm: Normal rate.  Pulmonary:     Effort: Pulmonary effort is normal.  Skin:    General: Skin is warm and dry.     Findings: No erythema or rash.  Neurological:     Mental Status: She is alert and oriented to person, place, and time.  Psychiatric:        Mood and Affect: Mood normal.        Behavior: Behavior normal.  Thought Content: Thought content normal.     Comments: Well groomed, good eye contact, normal speech and thoughts    Results for orders placed or performed in visit on 11/22/22  Vitamin B12  Result Value Ref Range   Vitamin B-12 519 180 - 914 pg/mL  Iron and TIBC([facility])  Result Value Ref Range   Iron 160 28 - 170 ug/dL   TIBC 161 096 - 045 ug/dL   Saturation Ratios 52 (H) 10.4 - 31.8 %   UIBC 151 ug/dL  Ferritin  Result Value Ref Range   Ferritin 277 11 - 307 ng/mL  CBC with Differential (Cancer Center Only)  Result Value Ref Range   WBC Count 14.1 (H) 4.0 - 10.5 K/uL   RBC 4.56 3.87 - 5.11 MIL/uL   Hemoglobin 15.2 (H) 12.0 - 15.0 g/dL   HCT 40.9 81.1 - 91.4 %   MCV 96.3 80.0 - 100.0 fL   MCH 33.3 26.0 - 34.0 pg   MCHC 34.6 30.0 - 36.0 g/dL   RDW 78.2 95.6 - 21.3 %   Platelet Count 208 150 - 400 K/uL   nRBC 0.0 0.0 - 0.2 %   Neutrophils Relative % 76 %   Neutro Abs 10.8 (H) 1.7 - 7.7 K/uL   Lymphocytes Relative 16 %   Lymphs Abs 2.3 0.7 - 4.0 K/uL   Monocytes Relative 6 %   Monocytes  Absolute 0.8 0.1 - 1.0 K/uL   Eosinophils Relative 1 %   Eosinophils Absolute 0.1 0.0 - 0.5 K/uL   Basophils Relative 1 %   Basophils Absolute 0.1 0.0 - 0.1 K/uL   Immature Granulocytes 0 %   Abs Immature Granulocytes 0.06 0.00 - 0.07 K/uL      Assessment & Plan:   Problem List Items Addressed This Visit     Dyslipidemia    Continue therapy Re order STatin therapy Atorvastatin 80mg  daily.      Relevant Medications   atorvastatin (LIPITOR) 80 MG tablet   Other Relevant Orders   Lipid panel   Essential hypertension - Primary    Issue with recent severe HYPERTENSION then improved, she had intolerance of med Amlodipine-Olmesartan Now improved BP but did better on Lisinopril - Home BP readings  History of CVA   Plan:  1. EDIT - new rx hydrochlorothiazide 12.5mg  daily, instead of previously ordered Lisinopril 10mg  daily - see phone note patient called back after visit she requested wrong med not lisinopril, she asked for furosemide 40mg  but I advised we cannot use this as monotherapy.   2. Encourage improved lifestyle - low sodium diet, regular exercise 3. Continue monitor BP outside office, bring readings to next visit, if persistently >140/90 or new symptoms notify office sooner       Relevant Medications   atorvastatin (LIPITOR) 80 MG tablet   Other Relevant Orders   COMPLETE METABOLIC PANEL WITH GFR   History of cerebrovascular accident (CVA) with residual deficit    History of CVA Start ACEi On ASA Statin      Other Visit Diagnoses     Abnormal glucose       Relevant Orders   Hemoglobin A1c         Meds ordered this encounter  Medications   XXXXX CANCELLED - lisinopril (ZESTRIL) 10 MG tablet    Sig: Take 1 tablet (10 mg total) by mouth daily.    Dispense:  90 tablet    Refill:  1   atorvastatin (LIPITOR) 80 MG tablet  Sig: Take 1 tablet (80 mg total) by mouth daily.    Dispense:  90 tablet    Refill:  1   New rx hydrochlorothiazide  12.5mg    Follow up plan: Return in about 4 months (around 04/24/2023), or if symptoms worsen or fail to improve.   [name], DO [facility] 12/22/2022, 9:24 AM

## 2022-12-22 NOTE — Assessment & Plan Note (Signed)
Continue therapy Re order STatin therapy Atorvastatin 80mg  daily.

## 2022-12-22 NOTE — Patient Instructions (Addendum)
Thank you for coming to the office today.  Stop the previous BP med  Start the Lisinopril 10mg  daily, keep track of BP, let me know how it goes.  Labs today, stay tuned for results.  Call PT [phone]  to follow up with scheduling. - Dr Ashley Royalty ordered this previously  Call Pain Management at [facility] [address], [place], [place] [address] Phone - [phone]  Refill Atorvastatin.  Please schedule a Follow-up Appointment to: Return in about 4 months (around 04/24/2023), or if symptoms worsen or fail to improve.  If you have any other questions or concerns, please feel free to call the office or send a message through MyChart. You may also schedule an earlier appointment if necessary.  Additionally, you may be receiving a survey about your experience at our office within a few days to 1 week by e-mail or mail. We value your feedback.  [name], DO [facility], [place]

## 2022-12-23 LAB — COMPLETE METABOLIC PANEL WITH GFR
AG Ratio: 2 (calc) (ref 1.0–2.5)
ALT: 12 U/L (ref 6–29)
AST: 13 U/L (ref 10–35)
Albumin: 4.3 g/dL (ref 3.6–5.1)
Alkaline phosphatase (APISO): 75 U/L (ref 31–125)
BUN: 8 mg/dL (ref 7–25)
CO2: 34 mmol/L — ABNORMAL HIGH (ref 20–32)
Calcium: 9.3 mg/dL (ref 8.6–10.2)
Chloride: 97 mmol/L — ABNORMAL LOW (ref 98–110)
Creat: 0.92 mg/dL (ref 0.50–0.99)
Globulin: 2.2 g/dL (calc) (ref 1.9–3.7)
Glucose, Bld: 90 mg/dL (ref 65–99)
Potassium: 3.4 mmol/L — ABNORMAL LOW (ref 3.5–5.3)
Sodium: 140 mmol/L (ref 135–146)
Total Bilirubin: 0.3 mg/dL (ref 0.2–1.2)
Total Protein: 6.5 g/dL (ref 6.1–8.1)
eGFR: 78 mL/min/{1.73_m2} (ref >=60)

## 2022-12-23 LAB — LIPID PANEL
Cholesterol: 252 mg/dL — ABNORMAL HIGH (ref <200)
HDL: 64 mg/dL (ref >=50)
LDL Cholesterol (Calc): 151 mg/dL (calc) — ABNORMAL HIGH
Non-HDL Cholesterol (Calc): 188 mg/dL (calc) — ABNORMAL HIGH (ref <130)
Total CHOL/HDL Ratio: 3.9 (calc) (ref <5.0)
Triglycerides: 231 mg/dL — ABNORMAL HIGH (ref <150)

## 2022-12-23 LAB — HEMOGLOBIN A1C
Hgb A1c MFr Bld: 5.7 % of total Hgb — ABNORMAL HIGH (ref <5.7)
Mean Plasma Glucose: 117 mg/dL
eAG (mmol/L): 6.5 mmol/L

## 2023-01-06 ENCOUNTER — Ambulatory Visit: Payer: 59 | Admitting: Family Medicine

## 2023-01-17 ENCOUNTER — Ambulatory Visit: Payer: Self-pay

## 2023-01-17 NOTE — Telephone Encounter (Signed)
  Chief Complaint: High BP reading Symptoms: None - Bp was 180/100"something" Frequency: a few days ago Pertinent Negatives: Patient denies any s/s Disposition: [] ED /[] Urgent Care (no appt availability in office) / [x] Appointment(In office/virtual)/ []  [place] [facility]/ [] Home Care/ [] Refused Recommended Disposition /[] [place] [facility]/ []  Follow-up with PCP Additional Notes: Pt states that her BP was 180 over 100"something" a few days ago. Pt does not really want to come into the office. She would like to double her hydrochlorothiazide to 25 mg. She states that the 12.5mg  is keeping her fluid under control, but is doing nothing for her BP.  Made office visit appt.  Please advise if increase in hydrochlorothiazide is appropriate.    Reason for Disposition  Systolic BP  >= 180 OR Diastolic >= 110  Answer Assessment - Initial Assessment Questions 1. BLOOD PRESSURE: "What is the blood pressure?" "Did you take at least two measurements 5 minutes apart?"     180/ 100 "something" 2. ONSET: "When did you take your blood pressure?"     A few days ago     4. HISTORY: "Do you have a history of high blood pressure?"     yes 5. MEDICINES: "Are you taking any medicines for blood pressure?" "Have you missed any doses recently?"     yes 6. OTHER SYMPTOMS: "Do you have any symptoms?" (e.g., blurred vision, chest pain, difficulty breathing, headache, weakness)     no  Protocols used: Blood Pressure - High-A-AH

## 2023-01-20 ENCOUNTER — Ambulatory Visit: Payer: Self-pay

## 2023-01-20 DIAGNOSIS — I10 Essential (primary) hypertension: Secondary | ICD-10-CM

## 2023-01-20 MED ORDER — HYDROCHLOROTHIAZIDE 25 MG PO TABS
25.0000 mg | ORAL_TABLET | Freq: Every day | ORAL | 0 refills | Status: DC
Start: 2023-01-20 — End: 2023-01-25

## 2023-01-20 NOTE — Telephone Encounter (Signed)
Agree with triage to ED  This is 2nd call in 2 days on this BP issue. Yes she may double her hydrochlorothiazide dose from 12.5 to 25mg . I sent new rx 25mg  daily today she can pick up if she prefers or just double existing.  She will probably need more medication at future visit regardless but she can start doubling the hydrochlorothiazide, but first with this severely elevated BP it may be wise to still go to ED  [name], DO [facility] 01/20/2023, 1:35 PM

## 2023-01-20 NOTE — Telephone Encounter (Signed)
Chief Complaint: High BP Symptoms: headache Frequency: onset headache Saturday  Pertinent Negatives: Patient denies other symptoms Disposition: [x] ED /[] Urgent Care (no appt availability in office) / [] Appointment(In office/virtual)/ []  [place] [facility]/ [] Home Care/ [] Refused Recommended Disposition /[] [place] [facility]/ []  Follow-up with PCP Additional Notes: Patient checked BP this morning prior to medications and it was 196/103. She says she took her medications as usual. She wants to know if she could double up on hydrochlorothiazide until there appointment on 02/08/23 with Dr. Kirtland Bouchard. I asked her to recheck BP while I'm on the phone. After around 2 minutes of silence and sitting, now BP 216/116, P 96. Advised ED due to high BP and headache. She says she will go.    Reason for Disposition  [1] Systolic BP  >= 160 OR Diastolic >= 100 AND [2] cardiac (e.g., breathing difficulty, chest pain) or neurologic symptoms (e.g., new-onset blurred or double vision, unsteady gait)  Answer Assessment - Initial Assessment Questions 1. BLOOD PRESSURE: "What is the blood pressure?" "Did you take at least two measurements 5 minutes apart?"     196/103 2. ONSET: "When did you take your blood pressure?"     This morning 3. HOW: "How did you take your blood pressure?" (e.g., automatic home BP monitor, visiting nurse)     Automatic home BP monitor 4. HISTORY: "Do you have a history of high blood pressure?"     Yes 5. MEDICINES: "Are you taking any medicines for blood pressure?" "Have you missed any doses recently?"     Yes 6. OTHER SYMPTOMS: "Do you have any symptoms?" (e.g., blurred vision, chest pain, difficulty breathing, headache, weakness)     Headache  Protocols used: Blood Pressure - High-A-AH

## 2023-01-22 ENCOUNTER — Emergency Department: Payer: 59

## 2023-01-22 ENCOUNTER — Other Ambulatory Visit: Payer: Self-pay

## 2023-01-22 ENCOUNTER — Inpatient Hospital Stay
Admission: EM | Admit: 2023-01-22 | Discharge: 2023-01-25 | DRG: 253 | Disposition: A | Discharge status: Home / Self Care | Payer: 59 | Attending: Internal Medicine | Admitting: Internal Medicine

## 2023-01-22 DIAGNOSIS — R0789 Other chest pain: Secondary | ICD-10-CM | POA: Diagnosis not present

## 2023-01-22 DIAGNOSIS — E878 Other disorders of electrolyte and fluid balance, not elsewhere classified: Secondary | ICD-10-CM | POA: Diagnosis not present

## 2023-01-22 DIAGNOSIS — E785 Hyperlipidemia, unspecified: Secondary | ICD-10-CM | POA: Diagnosis present

## 2023-01-22 DIAGNOSIS — D751 Secondary polycythemia: Secondary | ICD-10-CM | POA: Diagnosis present

## 2023-01-22 DIAGNOSIS — Z7902 Long term (current) use of antithrombotics/antiplatelets: Secondary | ICD-10-CM

## 2023-01-22 DIAGNOSIS — D7589 Other specified diseases of blood and blood-forming organs: Secondary | ICD-10-CM | POA: Diagnosis present

## 2023-01-22 DIAGNOSIS — Z7982 Long term (current) use of aspirin: Secondary | ICD-10-CM

## 2023-01-22 DIAGNOSIS — Z86718 Personal history of other venous thrombosis and embolism: Secondary | ICD-10-CM

## 2023-01-22 DIAGNOSIS — I10 Essential (primary) hypertension: Secondary | ICD-10-CM | POA: Diagnosis present

## 2023-01-22 DIAGNOSIS — I160 Hypertensive urgency: Secondary | ICD-10-CM

## 2023-01-22 DIAGNOSIS — D72829 Elevated white blood cell count, unspecified: Secondary | ICD-10-CM | POA: Diagnosis not present

## 2023-01-22 DIAGNOSIS — K219 Gastro-esophageal reflux disease without esophagitis: Secondary | ICD-10-CM | POA: Diagnosis present

## 2023-01-22 DIAGNOSIS — E876 Hypokalemia: Secondary | ICD-10-CM | POA: Diagnosis present

## 2023-01-22 DIAGNOSIS — Z825 Family history of asthma and other chronic lower respiratory diseases: Secondary | ICD-10-CM

## 2023-01-22 DIAGNOSIS — R7989 Other specified abnormal findings of blood chemistry: Secondary | ICD-10-CM

## 2023-01-22 DIAGNOSIS — Z8249 Family history of ischemic heart disease and other diseases of the circulatory system: Secondary | ICD-10-CM

## 2023-01-22 DIAGNOSIS — Z8673 Personal history of transient ischemic attack (TIA), and cerebral infarction without residual deficits: Secondary | ICD-10-CM

## 2023-01-22 DIAGNOSIS — Z806 Family history of leukemia: Secondary | ICD-10-CM

## 2023-01-22 DIAGNOSIS — I2489 Other forms of acute ischemic heart disease: Secondary | ICD-10-CM | POA: Diagnosis present

## 2023-01-22 DIAGNOSIS — Z83438 Family history of other disorder of lipoprotein metabolism and other lipidemia: Secondary | ICD-10-CM

## 2023-01-22 DIAGNOSIS — I161 Hypertensive emergency: Secondary | ICD-10-CM | POA: Diagnosis not present

## 2023-01-22 DIAGNOSIS — I701 Atherosclerosis of renal artery: Secondary | ICD-10-CM | POA: Diagnosis present

## 2023-01-22 DIAGNOSIS — T502X5A Adverse effect of carbonic-anhydrase inhibitors, benzothiadiazides and other diuretics, initial encounter: Secondary | ICD-10-CM | POA: Diagnosis present

## 2023-01-22 DIAGNOSIS — F17200 Nicotine dependence, unspecified, uncomplicated: Secondary | ICD-10-CM

## 2023-01-22 DIAGNOSIS — Z803 Family history of malignant neoplasm of breast: Secondary | ICD-10-CM

## 2023-01-22 DIAGNOSIS — F17210 Nicotine dependence, cigarettes, uncomplicated: Secondary | ICD-10-CM | POA: Diagnosis present

## 2023-01-22 DIAGNOSIS — Z832 Family history of diseases of the blood and blood-forming organs and certain disorders involving the immune mechanism: Secondary | ICD-10-CM

## 2023-01-22 DIAGNOSIS — Z809 Family history of malignant neoplasm, unspecified: Secondary | ICD-10-CM

## 2023-01-22 DIAGNOSIS — I214 Non-ST elevation (NSTEMI) myocardial infarction: Secondary | ICD-10-CM | POA: Diagnosis not present

## 2023-01-22 DIAGNOSIS — I150 Renovascular hypertension: Secondary | ICD-10-CM | POA: Diagnosis present

## 2023-01-22 DIAGNOSIS — Z79899 Other long term (current) drug therapy: Secondary | ICD-10-CM

## 2023-01-22 DIAGNOSIS — G629 Polyneuropathy, unspecified: Secondary | ICD-10-CM | POA: Diagnosis present

## 2023-01-22 LAB — CBC
HCT: 44.8 % (ref 36.0–46.0)
Hemoglobin: 15.9 g/dL — ABNORMAL HIGH (ref 12.0–15.0)
MCH: 34 pg (ref 26.0–34.0)
MCHC: 35.5 g/dL (ref 30.0–36.0)
MCV: 95.7 fL (ref 80.0–100.0)
Platelets: 270 10*3/uL (ref 150–400)
RBC: 4.68 MIL/uL (ref 3.87–5.11)
RDW: 13.2 % (ref 11.5–15.5)
WBC: 14.7 10*3/uL — ABNORMAL HIGH (ref 4.0–10.5)
nRBC: 0 % (ref 0.0–0.2)

## 2023-01-22 LAB — LIPID PANEL
Cholesterol: 218 mg/dL — ABNORMAL HIGH (ref 0–200)
HDL: 81 mg/dL (ref >40)
LDL Cholesterol: 96 mg/dL (ref 0–99)
Total CHOL/HDL Ratio: 2.7 RATIO
Triglycerides: 203 mg/dL — ABNORMAL HIGH (ref <150)
VLDL: 41 mg/dL — ABNORMAL HIGH (ref 0–40)

## 2023-01-22 LAB — TROPONIN I (HIGH SENSITIVITY)
Troponin I (High Sensitivity): 27 ng/L — ABNORMAL HIGH (ref <18)
Troponin I (High Sensitivity): 109 ng/L (ref <18)
Troponin I (High Sensitivity): 92 ng/L — ABNORMAL HIGH (ref <18)
Troponin I (High Sensitivity): 49 ng/L — ABNORMAL HIGH (ref <18)

## 2023-01-22 LAB — BASIC METABOLIC PANEL
Anion gap: 14 (ref 5–15)
BUN: 9 mg/dL (ref 6–20)
CO2: 29 mmol/L (ref 22–32)
Calcium: 8.6 mg/dL — ABNORMAL LOW (ref 8.9–10.3)
Chloride: 87 mmol/L — ABNORMAL LOW (ref 98–111)
Creatinine, Ser: 0.93 mg/dL (ref 0.44–1.00)
GFR, Estimated: >60 mL/min (ref >60)
Glucose, Bld: 109 mg/dL — ABNORMAL HIGH (ref 70–99)
Potassium: 2.9 mmol/L — ABNORMAL LOW (ref 3.5–5.1)
Sodium: 130 mmol/L — ABNORMAL LOW (ref 135–145)

## 2023-01-22 LAB — BRAIN NATRIURETIC PEPTIDE: B Natriuretic Peptide: 17 pg/mL (ref 0.0–100.0)

## 2023-01-22 LAB — MAGNESIUM: Magnesium: 1.9 mg/dL (ref 1.7–2.4)

## 2023-01-22 LAB — TSH: TSH: 4.344 u[IU]/mL (ref 0.350–4.500)

## 2023-01-22 LAB — PROTIME-INR
INR: 1 (ref 0.8–1.2)
Prothrombin Time: 13 seconds (ref 11.4–15.2)

## 2023-01-22 MED ORDER — TRAMADOL HCL 50 MG PO TABS
50.0000 mg | ORAL_TABLET | Freq: Four times a day (QID) | ORAL | Status: DC | PRN
  Administered 2023-01-22: 50 mg via ORAL
  Filled 2023-01-22: qty 1

## 2023-01-22 MED ORDER — ASPIRIN 81 MG PO CHEW
324.0000 mg | CHEWABLE_TABLET | Freq: Once | ORAL | Status: AC
  Administered 2023-01-22: 324 mg via ORAL
  Filled 2023-01-22: qty 4

## 2023-01-22 MED ORDER — ASPIRIN 81 MG PO CHEW
81.0000 mg | CHEWABLE_TABLET | Freq: Every day | ORAL | Status: DC
  Administered 2023-01-23 – 2023-01-25 (×3): 81 mg via ORAL
  Filled 2023-01-22 (×3): qty 1

## 2023-01-22 MED ORDER — GABAPENTIN 300 MG PO CAPS
900.0000 mg | ORAL_CAPSULE | Freq: Every morning | ORAL | Status: DC
  Administered 2023-01-23 – 2023-01-25 (×3): 900 mg via ORAL
  Filled 2023-01-22 (×3): qty 3

## 2023-01-22 MED ORDER — OXYCODONE-ACETAMINOPHEN 5-325 MG PO TABS
2.0000 | ORAL_TABLET | Freq: Once | ORAL | Status: AC
  Administered 2023-01-22: 2 via ORAL
  Filled 2023-01-22: qty 2

## 2023-01-22 MED ORDER — SODIUM CHLORIDE 0.9% FLUSH
3.0000 mL | Freq: Two times a day (BID) | INTRAVENOUS | Status: DC
  Administered 2023-01-22 – 2023-01-24 (×5): 3 mL via INTRAVENOUS

## 2023-01-22 MED ORDER — ACETAMINOPHEN 325 MG PO TABS: 650.0000 mg | ORAL_TABLET | Freq: Four times a day (QID) | ORAL | Status: DC | PRN

## 2023-01-22 MED ORDER — ACETAMINOPHEN 500 MG PO TABS
1000.0000 mg | ORAL_TABLET | Freq: Once | ORAL | Status: DC
  Filled 2023-01-22: qty 2

## 2023-01-22 MED ORDER — NITROGLYCERIN 0.4 MG SL SUBL: 0.4000 mg | SUBLINGUAL_TABLET | SUBLINGUAL | Status: DC | PRN

## 2023-01-22 MED ORDER — GABAPENTIN 300 MG PO CAPS: 600.0000 mg | ORAL_CAPSULE | Freq: Two times a day (BID) | ORAL | Status: DC

## 2023-01-22 MED ORDER — GABAPENTIN 400 MG PO CAPS
1200.0000 mg | ORAL_CAPSULE | Freq: Every evening | ORAL | Status: DC
  Administered 2023-01-22 – 2023-01-24 (×3): 1200 mg via ORAL
  Filled 2023-01-22 (×2): qty 3
  Filled 2023-01-22: qty 4

## 2023-01-22 MED ORDER — POLYETHYLENE GLYCOL 3350 17 G PO PACK: 17.0000 g | PACK | Freq: Every day | ORAL | Status: DC | PRN

## 2023-01-22 MED ORDER — PANTOPRAZOLE SODIUM 40 MG PO TBEC
40.0000 mg | DELAYED_RELEASE_TABLET | Freq: Every day | ORAL | Status: DC
  Administered 2023-01-23 – 2023-01-25 (×3): 40 mg via ORAL
  Filled 2023-01-22 (×3): qty 1

## 2023-01-22 MED ORDER — POTASSIUM CHLORIDE CRYS ER 20 MEQ PO TBCR
40.0000 meq | EXTENDED_RELEASE_TABLET | Freq: Once | ORAL | Status: AC
  Administered 2023-01-22: 40 meq via ORAL
  Filled 2023-01-22: qty 2

## 2023-01-22 MED ORDER — LABETALOL HCL 5 MG/ML IV SOLN
20.0000 mg | Freq: Once | INTRAVENOUS | Status: DC
  Filled 2023-01-22: qty 4

## 2023-01-22 MED ORDER — ONDANSETRON HCL 4 MG PO TABS: 4.0000 mg | ORAL_TABLET | Freq: Four times a day (QID) | ORAL | Status: DC | PRN

## 2023-01-22 MED ORDER — NICOTINE 14 MG/24HR TD PT24
14.0000 mg | MEDICATED_PATCH | Freq: Once | TRANSDERMAL | Status: AC
  Administered 2023-01-22: 14 mg via TRANSDERMAL
  Filled 2023-01-22: qty 1

## 2023-01-22 MED ORDER — SODIUM CHLORIDE 0.9 % IV BOLUS
500.0000 mL | Freq: Once | INTRAVENOUS | Status: AC
  Administered 2023-01-22: 500 mL via INTRAVENOUS

## 2023-01-22 MED ORDER — ENOXAPARIN SODIUM 40 MG/0.4ML IJ SOSY
40.0000 mg | PREFILLED_SYRINGE | INTRAMUSCULAR | Status: DC
  Administered 2023-01-22 – 2023-01-23 (×2): 40 mg via SUBCUTANEOUS
  Filled 2023-01-22 (×2): qty 0.4

## 2023-01-22 MED ORDER — ATORVASTATIN CALCIUM 80 MG PO TABS
80.0000 mg | ORAL_TABLET | Freq: Every day | ORAL | Status: DC
  Administered 2023-01-22 – 2023-01-25 (×4): 80 mg via ORAL
  Filled 2023-01-22: qty 1
  Filled 2023-01-22: qty 4
  Filled 2023-01-22 (×2): qty 1

## 2023-01-22 MED ORDER — LABETALOL HCL 5 MG/ML IV SOLN
10.0000 mg | Freq: Once | INTRAVENOUS | Status: AC
  Administered 2023-01-22: 10 mg via INTRAVENOUS

## 2023-01-22 MED ORDER — ONDANSETRON HCL 4 MG/2ML IJ SOLN
4.0000 mg | Freq: Four times a day (QID) | INTRAMUSCULAR | Status: DC | PRN
  Administered 2023-01-23: 4 mg via INTRAVENOUS
  Filled 2023-01-22: qty 2

## 2023-01-22 MED ORDER — CLOPIDOGREL BISULFATE 75 MG PO TABS
75.0000 mg | ORAL_TABLET | Freq: Every day | ORAL | Status: DC
  Administered 2023-01-23 – 2023-01-25 (×3): 75 mg via ORAL
  Filled 2023-01-22 (×3): qty 1

## 2023-01-22 MED ORDER — LABETALOL HCL 5 MG/ML IV SOLN
10.0000 mg | INTRAVENOUS | Status: DC | PRN
  Administered 2023-01-22: 10 mg via INTRAVENOUS
  Filled 2023-01-22: qty 4

## 2023-01-22 NOTE — ED Provider Notes (Signed)
[facility] Provider Note    Event Date/Time   First MD Initiated Contact with Patient 01/22/23 1217     (approximate)   History   Hypertension and Chest Pain   HPI AARA JACQUOT is a 47 y.o. female who presents for evaluation of high blood pressure.  She reports that this has been going on about a week.  She reports that she formally was taking lisinopril, but her doctor took her off of it and put her on HCTZ.  Her blood pressure has remained high all week and she is concerned that she needs to be on something different.  She is also having feelings of heart racing and pounding.  She denies chest pain and pressure until this morning.  She has had a headache consistently for about a week.  She has had a prior stroke but without neurological deficits.  She said that she started to develop some left arm numbness earlier today and had some chest pressure at the time but that has resolved.  She received 2 inches of nitroglycerin paste and 4 mg of Zofran by EMS prior to arrival.  She says she already had a headache prior to the Nitropaste being placed.     Physical Exam   Triage Vital Signs: ED Triage Vitals  Encounter Vitals Group     BP 01/22/23 1123 (!) 176/110     Systolic BP Percentile --      Diastolic BP Percentile --      Pulse Rate 01/22/23 1123 (!) 115     Resp 01/22/23 1123 19     Temp 01/22/23 1123 98.5 F (36.9 C)     Temp Source 01/22/23 1123 Oral     SpO2 01/22/23 1123 93 %     Weight 01/22/23 1223 54.4 kg (120 lb)     Height 01/22/23 1223 1.651 m (5\' 5" )     Head Circumference --      Peak Flow --      Pain Score 01/22/23 1124 5     Pain Loc --      Pain Education --      Exclude from Growth Chart --     Most recent vital signs: Vitals:   01/22/23 1530 01/22/23 1600  BP: (!) 191/126 (!) 199/110  Pulse: 96 87  Resp: 17 (!) 23  Temp:    SpO2: 95% 96%    General: Awake, no obvious distress.  Generally well-appearing.    CV:  Good peripheral perfusion.  Mild tachycardia, regular rhythm. Resp:  Normal effort. Speaking easily and comfortably, no accessory muscle usage nor intercostal retractions.   Abd:  No distention.  Other:  No focal neurological deficits.   ED Results / Procedures / Treatments   Labs (all labs ordered are listed, but only abnormal results are displayed) Labs Reviewed  BASIC METABOLIC PANEL - Abnormal; Notable for the following components:      Result Value   Sodium 130 (*)    Potassium 2.9 (*)    Chloride 87 (*)    Glucose, Bld 109 (*)    Calcium 8.6 (*)    All other components within normal limits  CBC - Abnormal; Notable for the following components:   WBC 14.7 (*)    Hemoglobin 15.9 (*)    All other components within normal limits  TROPONIN I (HIGH SENSITIVITY) - Abnormal; Notable for the following components:   Troponin I (High Sensitivity) 27 (*)    All other  components within normal limits  TROPONIN I (HIGH SENSITIVITY) - Abnormal; Notable for the following components:   Troponin I (High Sensitivity) 109 (*)    All other components within normal limits  PROTIME-INR  POC URINE PREG, ED     EKG  ED ECG REPORT I, Loleta Rose, the attending physician, personally viewed and interpreted this ECG.  Date: 01/22/2023 EKG Time: 11:30 AM Rate: 121 Rhythm: Sinus tachycardia QRS Axis: normal Intervals: normal ST/T Wave abnormalities: Non-specific ST segment / T-wave changes, but no clear evidence of acute ischemia. Narrative Interpretation: no definitive evidence of acute ischemia though there is some ST depression possibly due to rate related changes; does not meet STEMI criteria.    RADIOLOGY I viewed and interpreted the patient's noncontrast head CT and I see no evidence of acute intracranial hemorrhage nor neoplasm.  I also read the radiologist's report, which confirmed no acute findings.  I also viewed and interpreted the patient's two-view chest x-ray and I  see no acute abnormality such as pneumonia or pneumothorax or pulmonary edema.  I also read the radiologist's report, which confirmed no acute findings, just chronic COPD.   PROCEDURES:  Critical Care performed: Yes, see critical care procedure note(s)  .1-3 Lead EKG Interpretation  Performed by: Loleta Rose, MD Authorized by: Loleta Rose, MD     Interpretation: abnormal     ECG rate:  105   ECG rate assessment: tachycardic     Rhythm: sinus tachycardia     Ectopy: none     Conduction: normal   .Critical Care  Performed by: Loleta Rose, MD Authorized by: Loleta Rose, MD   Critical care provider statement:    Critical care time (minutes):  30   Critical care time was exclusive of:  Separately billable procedures and treating other patients   Critical care was necessary to treat or prevent imminent or life-threatening deterioration of the following conditions:  Circulatory failure   Critical care was time spent personally by me on the following activities:  Development of treatment plan with patient or surrogate, evaluation of patient's response to treatment, examination of patient, obtaining history from patient or surrogate, ordering and performing treatments and interventions, ordering and review of laboratory studies, ordering and review of radiographic studies, pulse oximetry, re-evaluation of patient's condition and review of old charts     IMPRESSION / MDM / ASSESSMENT AND PLAN / ED COURSE  I reviewed the triage vital signs and the nursing notes.                              Differential diagnosis includes, but is not limited to, hypertensive urgency/emergency, intracranial hemorrhage or neoplasm, PE, ACS.  Patient's presentation is most consistent with acute presentation with potential threat to life or bodily function.  Labs/studies ordered: CT head, two-view chest x-ray, CBC, basic metabolic panel, high-sensitivity troponin x 2, pro  time-INR  Interventions/Medications given:  Medications  nicotine (NICODERM CQ - dosed in mg/24 hours) patch 14 mg (has no administration in time range)  potassium chloride SA (KLOR-CON M) CR tablet 40 mEq (has no administration in time range)  aspirin chewable tablet 324 mg (has no administration in time range)  sodium chloride 0.9 % bolus 500 mL (0 mLs Intravenous Stopped 01/22/23 1346)  oxyCODONE-acetaminophen (PERCOCET/ROXICET) 5-325 MG per tablet 2 tablet (2 tablets Oral Given 01/22/23 1342)  labetalol (NORMODYNE) injection 10 mg (10 mg Intravenous Given 01/22/23 1346)    (Note:  hospital course my include additional interventions and/or labs/studies not listed above.)  Patient's blood pressure has been substantially elevated and her heart rate has been elevated as well.  She is certain that she has not been on any blood pressure medications other than lisinopril; I wondered about beta-blocker, calcium channel blocker, or clonidine withdrawal, but she has not been on them.  She has no infectious signs or symptoms.  Initial labs are notable for mild hyponatremia, hypokalemia, leukocytosis of 14.7, and an initial high-sensitivity troponin of 27.  I suspect this may be a troponin leak due to her uncontrolled hypertension or hypertensive urgency/emergency, but I cannot rule out ACS including NSTEMI.  Her EKG is generally reassuring with no evidence of STEMI; she has some ST segment depression which I believe is most likely in the setting of demand ischemia due to elevated rate.  Her blood pressure and heart rate have improved.  Initially I ordered labetalol 20 mg IV but her blood pressure did come down to about 145 systolic so she did not get it.  However then it bounced back up again to about 188 systolic.  I am giving labetalol 10 mg IV and will reassess.  Also ordered 2 Percocet for her headache.  Imaging reassuring with no evidence of acute intracranial hemorrhage.  I will reassess after the  labetalol and the second high-sensitivity troponin.     The patient is on the cardiac monitor to evaluate for evidence of arrhythmia and/or significant heart rate changes.   Clinical Course as of 01/22/23 1623  Sat Jan 22, 2023  1530 Patient's repeat troponin is 109.  This is a concerning increase from her prior value of 27.  This could indicate ongoing demand ischemia secondary to either primary cardiac cause (NSTEMI) or hypertensive urgency/emergency with some endorgan dysfunction (myocardial ischemia).  Her blood pressure has improved after treatment and she is currently asymptomatic.  I will consult the hospitalist service but will hold off on heparin for now and discuss whether they would prefer to manage this as primarily a hypertensive issue or an issue of ACS.  I ordered a full dose aspirin and 40 mill equivalents of potassium to replete her potassium level of 2.9.  She already received 500 mL of normal saline which should begin to address her mild hyponatremia.  Patient initially was reluctant for hospitalization but I explained the potential consequences of untreated cardiac ischemia regardless of the cause and she agrees to hospitalization. [CF]  1547 Consulted in person with Dr. Huel Cote with the hospitalist service.  She will evaluate the patient in person and decide whether to order heparin which I think is very reasonable and appropriate. [CF]    Clinical Course User Index [CF] Loleta Rose, MD     FINAL CLINICAL IMPRESSION(S) / ED DIAGNOSES   Final diagnoses:  Hypertensive emergency  Demand ischemia  Elevated troponin level  Atypical chest pain     Rx / DC Orders   ED Discharge Orders     None        Note:  This document was prepared using Dragon voice recognition software and may include unintentional dictation errors.   Loleta Rose, MD 01/22/23 [phone]

## 2023-01-22 NOTE — Assessment & Plan Note (Signed)
Likely due to HCTZ and then additional Lasix use this week.  - Magnesium level pending - S/p 500 cc bolus in the ED - S/p one-time dose of Kdur 40 mEq - Additional dose ordered for this evening

## 2023-01-22 NOTE — ED Triage Notes (Signed)
Pt presents to ED via [facility] with c/o of hypertension and chest pressure and L arm numbness that started this morning when pt woke up at 0630 and pt went to bed last night at 2200. Pt states HX of CVA 2022.

## 2023-01-22 NOTE — Assessment & Plan Note (Addendum)
Patient has a history of severe hypertension that has become uncontrolled since changing from lisinopril to HCTZ.  She seemed to have much better control with an ACE-I.    Given patient has a normal BMI, I am perplexed by her early onset hypertension although daily alcohol and tobacco use are certainly not helping.  - Telemetry monitoring - Discontinue HCTZ - Renal artery duplex - Evaluation for hyperaldosteronism - Start low-dose ARB - Continue utilizing as needed labetalol as needed for SBP over 180 or DBP over 110

## 2023-01-22 NOTE — Assessment & Plan Note (Signed)
Hemoglobin is stable at this time.  Most likely due to tobacco use this previous polycythemia vera workup has been negative.

## 2023-01-22 NOTE — ED Triage Notes (Signed)
First nurse note: pt to ED [facility] form home for htn for a few dose, has been trying to get in touch with doctor for a few days with no response. C/o chest tightness, left arm heaviness that pt woke up with, went to bed at 2200. Hx CVA  2 in nitroglycerin paste, 4 mg zofran PTA. 22g Left hand

## 2023-01-22 NOTE — Assessment & Plan Note (Signed)
At this time, patient's neurological status is at baseline.  - Continue home aspirin, Plavix and statin

## 2023-01-22 NOTE — Assessment & Plan Note (Signed)
Hard to differentiate if this is demand ischemia in the setting of severe hypertension versus true coronary event.  Patient has notable risk factors though, including a very strong family history with nearly all members of her family having their first coronary event in their 30s to 54s, hyperlipidemia, ischemic CVA 2 years ago, daily tobacco and alcohol use.  Due to this, will consult cardiology.  - Telemetry monitoring - Cardiology consulted; appreciate their recommendations - Continue to trend troponin.  If there is a marked increase, will start IV heparin at that time - Continue home aspirin and Plavix - Nitro as needed for chest pain - Echocardiogram

## 2023-01-22 NOTE — Progress Notes (Signed)
Pt called for CT, no answer, unable to locate pt

## 2023-01-22 NOTE — Assessment & Plan Note (Signed)
Improved with replacement of B12 and folate.  - Continue home regimen

## 2023-01-22 NOTE — Assessment & Plan Note (Signed)
Unclear etiology of elevated WBC at this point.  No signs or symptoms to suggest underlying infection.  Likely reactive.  - Repeat CBC in a.m.

## 2023-01-22 NOTE — H&P (Signed)
History and Physical    Patient: Joanna Garcia MWN:[id number] DOB: [birth year] DOA: 01/22/2023 DOS: the patient was seen and examined on 01/22/2023 PCP: Smitty Cords, DO  Patient coming from: Home  Chief Complaint:  Chief Complaint  Patient presents with   Hypertension   Chest Pain   HPI: Joanna Garcia is a 47 y.o. 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  Joanna Garcia states that for the last week, she has been experiencing a left-sided headache and hearing a whooshing sound in her head.  In addition, she has been experiencing palpitations that have been persistent.  Then today, she began to experience substernal chest pain with radiation down her left arm.  Due to this, she came to the ER.  She denies any nausea, vomiting, abdominal pain, lower extremity swelling, orthopnea.  She states that chest pain and palpitations have resolved but she is unsure what helped her symptoms.  At this time, she states that she continues to have her headache but it seems to be improving  Patient's medications were changed 1 month ago when she was taken off lisinopril 10 mg in place of hydrochlorothiazide 12.5 mg she states that lisinopril 10 mg was dropping her blood pressure down to systolics of 100.  Since starting HCTZ, her blood pressure has become gradually uncontrolled and she has taken 2 doses of her mom's Lasix this week.  Patient's mother at bedside states that they have a very strong family history of cardiovascular disease.  [name] had a stroke 2 years ago that required tPA and thrombectomy.  Her mother had her first heart attack in her 30s.  Patient's maternal grandfather had his first heart  attack in his 30s or 49s.  Patient's maternal aunt required heart transplant recently.  Patient notes that she smokes just under a pack of cigarettes per day and drinks 3-4 drinks per day.  Never more than that and no history of withdrawals.  ED course: On arrival to the ED, patient was hypertensive at 205/107 with heart rate of 114.  She was saturating at 95% on room air.  She was afebrile at 98.5. Initial workup notable for WBC of 14.7, hemoglobin 15.9, sodium 130, potassium 2.9, glucose 109, creatinine 0.93 with GFR above 60.  Troponin initially 27 with increased to 109.  EKG with no signs of acute ischemia patient was given labetalol, IV fluids, aspirin, and oxycodone.  [facility] contacted for admission.  Review of Systems: As mentioned in the history of present illness. All other systems reviewed and are negative.  Past Medical History:  Diagnosis Date   DVT (deep venous thrombosis) (HCC)    GERD (gastroesophageal reflux disease)    Hypertension    Polycythemia    Polyneuropathy    Stroke [facility])    Past Surgical History:  Procedure Laterality Date   COLONOSCOPY N/A 03/16/2019   Procedure: COLONOSCOPY;  Surgeon: Toledo, Boykin Nearing, MD;  Location: [facility] ENDOSCOPY;  Service: Gastroenterology;  Laterality: N/A;   ESOPHAGOGASTRODUODENOSCOPY N/A 03/16/2019   Procedure: ESOPHAGOGASTRODUODENOSCOPY (EGD);  Surgeon: Toledo, Boykin Nearing, MD;  Location: [facility] ENDOSCOPY;  Service: Gastroenterology;  Laterality: N/A;   IR ANGIO INTRA EXTRACRAN SEL COM CAROTID INNOMINATE UNI L MOD SED  02/09/2021   IR INTRAVSC STENT CERV  CAROTID W/EMB-PROT MOD SED INCL ANGIO  02/09/2021   IR PERCUTANEOUS ART THROMBECTOMY/INFUSION INTRACRANIAL INC DIAG ANGIO  02/09/2021   IR US GUIDE VASC ACCESS RIGHT  02/09/2021   NO PAST SURGERIES     RADIOLOGY WITH ANESTHESIA N/A 02/09/2021   Procedure: IR WITH ANESTHESIA;  Surgeon: Radiologist, Medication, MD;  Location: [facility] OR;  Service: Radiology;  Laterality: N/A;   Social History:  reports  that she has been smoking cigarettes. She started smoking about 30 years ago. She has a 22.9 pack-year smoking history. She has never used smokeless tobacco. She reports current alcohol use. She reports that she does not currently use drugs after having used the following drugs: Marijuana.  No Known Allergies  Family History  Problem Relation Age of Onset   Hypertension Mother    Hyperlipidemia Mother    Heart disease Mother    COPD Mother    Hemochromatosis Mother    Hypertension Father    Hyperlipidemia Father    Cancer Father        prostate   Hemochromatosis Maternal Aunt    Hemochromatosis Maternal Aunt    Hemochromatosis Maternal Uncle    Leukemia Paternal Uncle    Cancer Paternal Grandmother    Breast cancer Paternal Grandmother     Prior to Admission medications   Medication Sig Start Date End Date Taking? Authorizing Provider  aspirin 81 MG chewable tablet Chew and swallow 1 tablet (81 mg total) by mouth daily. 02/12/21  Yes Rinehuls, Kinnie Scales, PA-C  atorvastatin (LIPITOR) 80 MG tablet Take 1 tablet (80 mg total) by mouth daily. 12/22/22  Yes Karamalegos, Netta Neat, DO  clopidogrel (PLAVIX) 75 MG tablet Take 1 tablet (75 mg total) by mouth daily. 11/17/22  Yes Karamalegos, Netta Neat, DO  gabapentin (NEURONTIN) 300 MG capsule Take 3 caps = 900mg  dose in morning and afternoon and take 4 caps = 1200mg  in evening. Max dose is 10 capsules in 24 hours. 11/09/22  Yes Karamalegos, Netta Neat, DO  hydrochlorothiazide (HYDRODIURIL) 25 MG tablet Take 1 tablet (25 mg total) by mouth daily. Patient taking differently: Take 12.5 mg by mouth daily. 01/20/23  Yes Karamalegos, Netta Neat, DO  meloxicam (MOBIC) 15 MG tablet Take 1 tablet (15 mg total) by mouth daily as needed for pain (back pain). Patient taking differently: Take 15 mg by mouth daily. 10/19/22  Yes Karamalegos, Netta Neat, DO  ondansetron (ZOFRAN-ODT) 4 MG disintegrating tablet Take 1 tablet (4 mg total) by mouth every 8  (eight) hours as needed for nausea or vomiting. 10/19/22  Yes Karamalegos, Netta Neat, DO  pantoprazole (PROTONIX) 40 MG tablet Take 1 tablet (40 mg total) by mouth daily before breakfast. 11/18/22  Yes Karamalegos, Netta Neat, DO  tiZANidine (ZANAFLEX) 4 MG tablet Take 1 tablet (4 mg total) by mouth every 8 (eight) hours as needed for muscle spasms. 10/19/22  Yes Karamalegos, Netta Neat, DO  diclofenac Sodium (VOLTAREN) 1 % GEL Apply 2 g topically 2 (two) times daily as needed. To affected joint. Patient not taking: Reported on 01/22/2023 11/25/22   Jerrol Banana, MD  DULoxetine (CYMBALTA) 30 MG capsule Take 1 capsule (30 mg total) by mouth every evening for 14 days, THEN 2 capsules (60 mg total) every evening for 16 days. CONTACT us FOR REFILL AT 60 MG AFTER THIS RX. Patient not taking: Reported on 12/22/2022 12/10/22 01/09/23  Jerrol Banana, MD  folic acid (FOLVITE) 1 MG tablet TAKE 2 TABLETS BY MOUTH DAILY. Patient not taking: Reported  on 01/22/2023 04/08/21   Creig Hines, MD  lidocaine (LIDODERM) 5 % Place 1 patch onto the skin every 12 (twelve) hours. Remove & Discard patch within 12 hours or as directed by MD Patient not taking: Reported on 01/22/2023 11/25/22   Jerrol Banana, MD    Physical Exam: Vitals:   01/22/23 1500 01/22/23 1530 01/22/23 1600 01/22/23 1630  BP: (!) 153/91 (!) 191/126 (!) 199/110 (!) 150/90  Pulse: 65 96 87 89  Resp: (!) 23 17 (!) 23 13  Temp:      TempSrc:      SpO2: 94% 95% 96% 96%  Weight:      Height:       Physical Exam Vitals and nursing note reviewed.  Constitutional:      General: She is not in acute distress.    Appearance: She is normal weight. She is not ill-appearing.  HENT:     Head: Normocephalic and atraumatic.  Eyes:     Extraocular Movements: Extraocular movements intact.     Pupils: Pupils are equal, round, and reactive to light.  Neck:     Vascular: No JVD.  Cardiovascular:     Rate and Rhythm: Normal rate and regular rhythm.      Heart sounds: Murmur (3/6 systolic murmur) heard.     No diastolic murmur is present.     No friction rub.  Pulmonary:     Effort: Pulmonary effort is normal. No tachypnea or respiratory distress.     Breath sounds: No decreased breath sounds, wheezing, rhonchi or rales.  Abdominal:     Palpations: Abdomen is soft.     Tenderness: There is no abdominal tenderness. There is no guarding.  Musculoskeletal:     Right lower leg: No edema.     Left lower leg: No edema.  Skin:    General: Skin is warm and dry.  Neurological:     General: No focal deficit present.     Mental Status: She is alert and oriented to person, place, and time.     Motor: No weakness.  Psychiatric:        Mood and Affect: Mood normal.        Behavior: Behavior normal.    Data Reviewed: CBC with WBC of 14.7, hemoglobin 15.9, MCV of 95, platelets of 270 BMP with sodium of 130, potassium 2.9, chloride 87, bicarb 29, glucose 109, creatinine 0.93 with GFR above 60 Troponin 27 with increased to 109  EKG personally reviewed.  Sinus rhythm with rate of 121.  P wave enlargement noted.  Somewhat diffuse ST depression that is less than 1 mm, not meeting criteria.  Likely demand in the setting of elevated heart rate.  Right axis deviation.  CT HEAD WO CONTRAST ( )  Result Date: 01/22/2023 CLINICAL DATA:  numbness EXAM: CT HEAD WITHOUT CONTRAST TECHNIQUE: Contiguous axial images were obtained from the base of the skull through the vertex without intravenous contrast. RADIATION DOSE REDUCTION: This exam was performed according to the departmental dose-optimization program which includes automated exposure control, adjustment of the mA and/or kV according to patient size and/or use of iterative reconstruction technique. COMPARISON:  Brain MR 02/10/2021 FINDINGS: Brain: No evidence of acute infarction, hemorrhage, hydrocephalus, extra-axial collection or mass lesion/mass effect. Chronic infarct in the right parietal lobe (series 2,  image 14). Vascular: No hyperdense vessel or unexpected calcification. Skull: Normal. Negative for fracture or focal lesion. Sinuses/Orbits: No middle ear or mastoid effusion. Paranasal sinuses are clear. Orbits are  unremarkable. Other: None. IMPRESSION: No acute intracranial abnormality. Electronically Signed   By: Lorenza Cambridge M.D.   On: 01/22/2023 11:52   DG Chest 2 View  Result Date: 01/22/2023 CLINICAL DATA:  Chest pain EXAM: CHEST - 2 VIEW COMPARISON:  03/14/2019 FINDINGS: Cardiac size is within normal limits. There are no signs of pulmonary edema or focal pulmonary consolidation. Increase in AP diameter of chest suggests COPD. Emphysematous bullae are seen in the right upper lung field. IMPRESSION: COPD. There are no signs of pulmonary edema or focal pulmonary consolidation. Electronically Signed   By: Ernie Avena M.D.   On: 01/22/2023 11:50    Results are pending, will review when available.  Assessment and Plan:  * NSTEMI (non-ST elevated myocardial infarction) (HCC) Hard to differentiate if this is demand ischemia in the setting of severe hypertension versus true coronary event.  Patient has notable risk factors though, including a very strong family history with nearly all members of her family having their first coronary event in their 30s to 25s, hyperlipidemia, ischemic CVA 2 years ago, daily tobacco and alcohol use.  Due to this, will consult cardiology.  - Telemetry monitoring - Cardiology consulted; appreciate their recommendations - Continue to trend troponin.  If there is a marked increase, will start IV heparin at that time - Continue home aspirin and Plavix - Nitro as needed for chest pain - Echocardiogram  Severe hypertension Patient has a history of severe hypertension that has become uncontrolled since changing from lisinopril to HCTZ.  She seemed to have much better control with an ACE-I.    Given patient has a normal BMI, I am perplexed by her early onset  hypertension although daily alcohol and tobacco use are certainly not helping.  - Telemetry monitoring - Discontinue HCTZ - Renal artery duplex - Evaluation for hyperaldosteronism - Start low-dose ARB - Continue utilizing as needed labetalol as needed for SBP over 180 or DBP over 110  Electrolyte abnormality Likely due to HCTZ and then additional Lasix use this week.  - Magnesium level pending - S/p 500 cc bolus in the ED - S/p one-time dose of Kdur 40 mEq - Additional dose ordered for this evening  History of CVA (cerebrovascular accident) At this time, patient's neurological status is at baseline.  - Continue home aspirin, Plavix and statin  Polycythemia Hemoglobin is stable at this time.  Most likely due to tobacco use this previous polycythemia vera workup has been negative.  Leukocytosis Unclear etiology of elevated WBC at this point.  No signs or symptoms to suggest underlying infection.  Likely reactive.  - Repeat CBC in a.m.  Macrocytosis without anemia Improved with replacement of B12 and folate.  - Continue home regimen  Advance Care Planning:   Code Status: Full Code   Consults: Cardiology  Family Communication: Patient's mother updated at bedside  Severity of Illness: The appropriate patient status for this patient is OBSERVATION. Observation status is judged to be reasonable and necessary in order to provide the required intensity of service to ensure the patient's safety. The patient's presenting symptoms, physical exam findings, and initial radiographic and laboratory data in the context of their medical condition is felt to place them at decreased risk for further clinical deterioration. Furthermore, it is anticipated that the patient will be medically stable for discharge from the hospital within 2 midnights of admission.   Author: Verdene Lennert, MD 01/22/2023 5:28 PM  For on call review [email or web address].

## 2023-01-23 ENCOUNTER — Observation Stay: Payer: 59

## 2023-01-23 ENCOUNTER — Observation Stay: Admit: 2023-01-23 | Payer: 59

## 2023-01-23 DIAGNOSIS — I7789 Other specified disorders of arteries and arterioles: Secondary | ICD-10-CM | POA: Diagnosis not present

## 2023-01-23 DIAGNOSIS — I214 Non-ST elevation (NSTEMI) myocardial infarction: Secondary | ICD-10-CM | POA: Diagnosis not present

## 2023-01-23 DIAGNOSIS — D751 Secondary polycythemia: Secondary | ICD-10-CM

## 2023-01-23 DIAGNOSIS — I160 Hypertensive urgency: Secondary | ICD-10-CM

## 2023-01-23 DIAGNOSIS — I10 Essential (primary) hypertension: Secondary | ICD-10-CM | POA: Diagnosis not present

## 2023-01-23 DIAGNOSIS — F17200 Nicotine dependence, unspecified, uncomplicated: Secondary | ICD-10-CM | POA: Diagnosis not present

## 2023-01-23 DIAGNOSIS — E878 Other disorders of electrolyte and fluid balance, not elsewhere classified: Secondary | ICD-10-CM

## 2023-01-23 DIAGNOSIS — Z8673 Personal history of transient ischemic attack (TIA), and cerebral infarction without residual deficits: Secondary | ICD-10-CM

## 2023-01-23 DIAGNOSIS — R079 Chest pain, unspecified: Secondary | ICD-10-CM

## 2023-01-23 DIAGNOSIS — R7989 Other specified abnormal findings of blood chemistry: Secondary | ICD-10-CM

## 2023-01-23 LAB — COMPREHENSIVE METABOLIC PANEL WITH GFR
ALT: 12 U/L (ref 0–44)
AST: 20 U/L (ref 15–41)
Albumin: 3.3 g/dL — ABNORMAL LOW (ref 3.5–5.0)
Alkaline Phosphatase: 66 U/L (ref 38–126)
Anion gap: 11 (ref 5–15)
BUN: 10 mg/dL (ref 6–20)
CO2: 30 mmol/L (ref 22–32)
Calcium: 8.3 mg/dL — ABNORMAL LOW (ref 8.9–10.3)
Chloride: 91 mmol/L — ABNORMAL LOW (ref 98–111)
Creatinine, Ser: 0.98 mg/dL (ref 0.44–1.00)
GFR, Estimated: >60 mL/min (ref >60)
Glucose, Bld: 105 mg/dL — ABNORMAL HIGH (ref 70–99)
Potassium: 2.8 mmol/L — ABNORMAL LOW (ref 3.5–5.1)
Sodium: 132 mmol/L — ABNORMAL LOW (ref 135–145)
Total Bilirubin: 1 mg/dL (ref 0.3–1.2)
Total Protein: 5.8 g/dL — ABNORMAL LOW (ref 6.5–8.1)

## 2023-01-23 LAB — BASIC METABOLIC PANEL
Anion gap: 10 (ref 5–15)
BUN: 9 mg/dL (ref 6–20)
CO2: 32 mmol/L (ref 22–32)
Calcium: 8.7 mg/dL — ABNORMAL LOW (ref 8.9–10.3)
Chloride: 91 mmol/L — ABNORMAL LOW (ref 98–111)
Creatinine, Ser: 0.94 mg/dL (ref 0.44–1.00)
GFR, Estimated: >60 mL/min (ref >60)
Glucose, Bld: 93 mg/dL (ref 70–99)
Potassium: 4 mmol/L (ref 3.5–5.1)
Sodium: 133 mmol/L — ABNORMAL LOW (ref 135–145)

## 2023-01-23 LAB — CBC
HCT: 39.5 % (ref 36.0–46.0)
Hemoglobin: 13.9 g/dL (ref 12.0–15.0)
MCH: 34.2 pg — ABNORMAL HIGH (ref 26.0–34.0)
MCHC: 35.2 g/dL (ref 30.0–36.0)
MCV: 97.3 fL (ref 80.0–100.0)
Platelets: 216 10*3/uL (ref 150–400)
RBC: 4.06 MIL/uL (ref 3.87–5.11)
RDW: 13.2 % (ref 11.5–15.5)
WBC: 6.7 10*3/uL (ref 4.0–10.5)
nRBC: 0 % (ref 0.0–0.2)

## 2023-01-23 LAB — ECHOCARDIOGRAM COMPLETE
AR max vel: 1.59 cm2
AV Peak grad: 10.5 mmHg
Ao pk vel: 1.62 m/s
Area-P 1/2: 3.79 cm2
Height: 65 in
S' Lateral: 2.8 cm
Weight: 1932.99 [oz_av]

## 2023-01-23 LAB — HIV ANTIBODY (ROUTINE TESTING W REFLEX): HIV Screen 4th Generation wRfx: NONREACTIVE

## 2023-01-23 MED ORDER — ACETAMINOPHEN 325 MG PO TABS: 650.0000 mg | ORAL_TABLET | Freq: Four times a day (QID) | ORAL | Status: DC | PRN

## 2023-01-23 MED ORDER — NICOTINE 14 MG/24HR TD PT24: 14.0000 mg | MEDICATED_PATCH | Freq: Every day | TRANSDERMAL | Status: DC

## 2023-01-23 MED ORDER — SENNOSIDES-DOCUSATE SODIUM 8.6-50 MG PO TABS
2.0000 | ORAL_TABLET | Freq: Two times a day (BID) | ORAL | Status: DC
  Administered 2023-01-23 – 2023-01-24 (×3): 2 via ORAL
  Filled 2023-01-23 (×4): qty 2

## 2023-01-23 MED ORDER — AMLODIPINE BESYLATE 5 MG PO TABS
5.0000 mg | ORAL_TABLET | Freq: Every day | ORAL | Status: DC
  Administered 2023-01-23 – 2023-01-25 (×3): 5 mg via ORAL
  Filled 2023-01-23 (×3): qty 1

## 2023-01-23 MED ORDER — LOSARTAN POTASSIUM 25 MG PO TABS
25.0000 mg | ORAL_TABLET | Freq: Every day | ORAL | Status: DC
  Administered 2023-01-23 – 2023-01-25 (×3): 25 mg via ORAL
  Filled 2023-01-23 (×3): qty 1

## 2023-01-23 MED ORDER — METOPROLOL TARTRATE 50 MG PO TABS
100.0000 mg | ORAL_TABLET | Freq: Once | ORAL | Status: AC
  Administered 2023-01-23: 100 mg via ORAL
  Filled 2023-01-23: qty 2

## 2023-01-23 MED ORDER — POTASSIUM CHLORIDE CRYS ER 20 MEQ PO TBCR
40.0000 meq | EXTENDED_RELEASE_TABLET | Freq: Once | ORAL | Status: AC
  Administered 2023-01-23: 40 meq via ORAL
  Filled 2023-01-23: qty 2

## 2023-01-23 MED ORDER — MELATONIN 5 MG PO TABS
5.0000 mg | ORAL_TABLET | Freq: Every day | ORAL | Status: DC
  Administered 2023-01-23 – 2023-01-24 (×3): 5 mg via ORAL
  Filled 2023-01-23 (×3): qty 1

## 2023-01-23 MED ORDER — BUTALBITAL-APAP-CAFFEINE 50-325-40 MG PO TABS
1.0000 | ORAL_TABLET | Freq: Four times a day (QID) | ORAL | Status: DC | PRN
  Administered 2023-01-23 – 2023-01-24 (×3): 1 via ORAL
  Filled 2023-01-23 (×3): qty 1

## 2023-01-23 MED ORDER — SODIUM CHLORIDE 0.9 % IV SOLN
25.0000 mg | Freq: Four times a day (QID) | INTRAVENOUS | Status: DC | PRN
  Administered 2023-01-23: 25 mg via INTRAVENOUS
  Filled 2023-01-23: qty 1

## 2023-01-23 MED ORDER — NICOTINE 14 MG/24HR TD PT24
14.0000 mg | MEDICATED_PATCH | Freq: Every day | TRANSDERMAL | Status: DC
  Administered 2023-01-23 – 2023-01-24 (×2): 14 mg via TRANSDERMAL
  Filled 2023-01-23 (×2): qty 1

## 2023-01-23 MED ORDER — POLYETHYLENE GLYCOL 3350 17 G PO PACK
17.0000 g | PACK | Freq: Every day | ORAL | Status: DC
  Administered 2023-01-23: 17 g via ORAL
  Filled 2023-01-23 (×2): qty 1

## 2023-01-23 MED ORDER — POTASSIUM CHLORIDE 10 MEQ/100ML IV SOLN
10.0000 meq | INTRAVENOUS | Status: DC
  Administered 2023-01-23: 10 meq via INTRAVENOUS
  Filled 2023-01-23 (×2): qty 100

## 2023-01-23 NOTE — TOC CM/SW Note (Signed)
Transition of Care [facility]) - Inpatient Brief Assessment   Patient Details  Name: Joanna Garcia MRN: [id number] Date of Birth: [birth year]  Transition of Care [facility]) CM/SW Contact:    Liliana Cline, LCSW Phone Number: 01/23/2023, 9:43 AM   Clinical Narrative:    Transition of Care Asessment: Insurance and Status: Insurance coverage has been reviewed Patient has primary care physician: Yes     Prior/Current Home Services: No current home services Social Determinants of Health Reivew: SDOH reviewed no interventions necessary Readmission risk has been reviewed: Yes Transition of care needs: no transition of care needs at this time

## 2023-01-23 NOTE — Progress Notes (Addendum)
1       [facility] at [facility]   PATIENT NAME: Joanna Garcia    MR#:  [id number]  PCP: Smitty Cords, DO  DATE OF BIRTH:  1976-03-25  SUBJECTIVE:  CHIEF COMPLAINT:   Chief Complaint  Patient presents with   Hypertension   Chest Pain   Complains of headache worrisome for migraine which she used to have several years back and Fioricet use to help. REVIEW OF SYSTEMS:  Review of Systems  Constitutional:  Positive for malaise/fatigue.  Cardiovascular:  Positive for chest pain.  Neurological:  Positive for headaches.  All other systems reviewed and are negative.  DRUG ALLERGIES:  No Known Allergies VITALS:  Blood pressure (!) 122/92, pulse 72, temperature 98.3 F (36.8 C), temperature source Oral, resp. rate 18, height 5\' 5"  (1.651 m), weight 54.8 kg, SpO2 98%. PHYSICAL EXAMINATION:  Physical Exam Vitals and nursing note reviewed.  Constitutional:      Appearance: She is well-developed.  HENT:     Head: Normocephalic and atraumatic.  Eyes:     Pupils: Pupils are equal, round, and reactive to light.  Cardiovascular:     Rate and Rhythm: Normal rate and regular rhythm.     Heart sounds: Normal heart sounds.  Pulmonary:     Effort: Pulmonary effort is normal.     Breath sounds: Normal breath sounds.  Abdominal:     General: Bowel sounds are normal.     Palpations: Abdomen is soft.  Musculoskeletal:        General: Normal range of motion.     Cervical back: Normal range of motion and neck supple.  Skin:    General: Skin is warm.  Neurological:     General: No focal deficit present.     Mental Status: She is alert.    LABORATORY PANEL:  Female CBC Recent Labs  Lab 01/23/23 0524  WBC 6.7  HGB 13.9  HCT 39.5  PLT 216   ------------------------------------------------------------------------------------------------------------------ Chemistries  Recent Labs  Lab 01/22/23 1730 01/23/23 0524 01/23/23 1434  NA  --  132* 133*  K  --  2.8*  4.0  CL  --  91* 91*  CO2  --  30 32  GLUCOSE  --  105* 93  BUN  --  10 9  CREATININE  --  0.98 0.94  CALCIUM  --  8.3* 8.7*  MG 1.9  --   --   AST  --  20  --   ALT  --  12  --   ALKPHOS  --  66  --   BILITOT  --  1.0  --    MEDICATIONS:  Scheduled Meds:  amLODipine  5 mg Oral Daily   aspirin  81 mg Oral Daily   atorvastatin  80 mg Oral Daily   clopidogrel  75 mg Oral Daily   enoxaparin (LOVENOX) injection  40 mg Subcutaneous Q24H   gabapentin  900 mg Oral q morning   And   gabapentin  1,200 mg Oral QPM   losartan  25 mg Oral Daily   melatonin  5 mg Oral QHS   nicotine  14 mg Transdermal Once   nicotine  14 mg Transdermal Daily   pantoprazole  40 mg Oral QAC breakfast   polyethylene glycol  17 g Oral Daily   senna-docusate  2 tablet Oral BID   sodium chloride flush  3 mL Intravenous Q12H   Continuous Infusions:  promethazine (PHENERGAN) injection (IM or IVPB)  25 mg (01/23/23 1622)   RADIOLOGY:  US RENAL ARTERY DUPLEX COMPLETE  Result Date: 01/23/2023 CLINICAL DATA:  Hypertension EXAM: RENAL/URINARY TRACT ULTRASOUND RENAL DUPLEX DOPPLER ULTRASOUND COMPARISON:  None available FINDINGS: Right Kidney: Length: 11.5 cm. Echogenicity within normal limits. No mass or hydronephrosis visualized. Left Kidney: Length: 11.1 cm. Echogenicity within normal limits. No mass or hydronephrosis visualized. Bladder:  Unremarkable RENAL DUPLEX ULTRASOUND Right Renal Artery Velocities: Origin:  101 cm/sec Mid:  107 cm/sec Hilum:  73 cm/sec Interlobar:  40 cm/sec Arcuate:  34 cm/sec Left Renal Artery Velocities: Origin:  236 cm/sec Mid:  106 cm/sec Hilum:  122 cm/sec Interlobar:  28 cm/sec Arcuate:  22 cm/sec Aortic Velocity:  66 cm/sec Right Renal-Aortic Ratios: Origin: 1.5 Mid:  1.6 Hilum: 1.1 Interlobar: 0.6 Arcuate: 0.5 Left Renal-Aortic Ratios: Origin: 3.7 Mid: 1.6 Hilum: 1.8 Interlobar: 0.4 Arcuate: 7.3 IMPRESSION: Mildly elevated velocity of the origin of the left main renal artery is suspicious  for stenosis. Electronically Signed   By: Acquanetta Belling M.D.   On: 01/23/2023 13:04   ECHOCARDIOGRAM COMPLETE  Result Date: 01/23/2023    ECHOCARDIOGRAM REPORT   Patient Name:   Joanna Garcia Lewing Date of Exam: 01/23/2023 Medical Rec #:  [id number]       Height:       65.0 in Accession #:    [id number]      Weight:       120.8 lb Date of Birth:  1975-06-24       BSA:          1.597 m Patient Age:    47 years        BP:           98/77 mmHg Patient Gender: F               HR:           72 bpm. Exam Location:  [facility] Procedure: 2D Echo and Strain Analysis Indications:     NSTEMI I21.4                  Chest Pain R07.9  History:         Patient has prior history of Echocardiogram examinations, most                  recent 02/10/2021.  Sonographer:     Overton Mam RDCS, FASE Referring Phys:  [id number] Verdene Lennert Diagnosing Phys: Debbe Odea MD  Sonographer Comments: Global longitudinal strain was attempted. IMPRESSIONS  1. Left ventricular ejection fraction, by estimation, is 55 to 60%. The left ventricle has normal function. The left ventricle has no regional wall motion abnormalities. Left ventricular diastolic parameters were normal. The average left ventricular global longitudinal strain is -16.7 %. The global longitudinal strain is normal.  2. Right ventricular systolic function is normal. The right ventricular size is normal.  3. The mitral valve is normal in structure. Mild mitral valve regurgitation.  4. The aortic valve was not well visualized. Aortic valve regurgitation is not visualized. Aortic valve sclerosis is present, with no evidence of aortic valve stenosis.  5. The inferior vena cava is normal in size with greater than 50% respiratory variability, suggesting right atrial pressure of 3 mmHg. FINDINGS  Left Ventricle: Left ventricular ejection fraction, by estimation, is 55 to 60%. The left ventricle has normal function. The left ventricle has no regional wall motion abnormalities. The average left  ventricular global longitudinal strain is -16.7 %. The global longitudinal strain  is normal. The left ventricular internal cavity size was normal in size. There is no left ventricular hypertrophy. Left ventricular diastolic parameters were normal. Right Ventricle: The right ventricular size is normal. No increase in right ventricular wall thickness. Right ventricular systolic function is normal. Left Atrium: Left atrial size was normal in size. Right Atrium: Right atrial size was normal in size. Pericardium: There is no evidence of pericardial effusion. Mitral Valve: The mitral valve is normal in structure. Mild mitral valve regurgitation. Tricuspid Valve: The tricuspid valve is normal in structure. Tricuspid valve regurgitation is not demonstrated. Aortic Valve: The aortic valve was not well visualized. Aortic valve regurgitation is not visualized. Aortic valve sclerosis is present, with no evidence of aortic valve stenosis. Aortic valve peak gradient measures 10.5 mmHg. Pulmonic Valve: The pulmonic valve was normal in structure. Pulmonic valve regurgitation is not visualized. Aorta: The aortic root is normal in size and structure. Venous: The inferior vena cava is normal in size with greater than 50% respiratory variability, suggesting right atrial pressure of 3 mmHg. IAS/Shunts: No atrial level shunt detected by color flow Doppler.  LEFT VENTRICLE PLAX 2D LVIDd:         4.20 cm   Diastology LVIDs:         2.80 cm   LV e' medial:    11.50 cm/s LV PW:         0.90 cm   LV E/e' medial:  8.3 LV IVS:        1.00 cm   LV e' lateral:   11.90 cm/s LVOT diam:     1.80 cm   LV E/e' lateral: 8.0 LV SV:         45 LV SV Index:   28        2D Longitudinal Strain LVOT Area:     2.54 cm  2D Strain GLS Avg:     -16.7 %  RIGHT VENTRICLE RV Basal diam:  1.90 cm RV S prime:     14.50 cm/s TAPSE (M-mode): 2.2 cm LEFT ATRIUM             Index        RIGHT ATRIUM          Index LA diam:        2.30 cm 1.44 cm/m   RA Area:     8.16  cm LA Vol (A2C):   28.8 ml 18.04 ml/m  RA Volume:   15.30 ml 9.58 ml/m LA Vol (A4C):   29.0 ml 18.16 ml/m LA Biplane Vol: 29.7 ml 18.60 ml/m  AORTIC VALVE                 PULMONIC VALVE AV Area (Vmax): 1.59 cm     PV Vmax:        1.01 m/s AV Vmax:        162.00 cm/s  PV Peak grad:   4.1 mmHg AV Peak Grad:   10.5 mmHg    RVOT Peak grad: 4 mmHg LVOT Vmax:      101.00 cm/s LVOT Vmean:     61.800 cm/s LVOT VTI:       0.175 m  AORTA Ao Root diam: 3.10 cm MITRAL VALVE MV Area (PHT): 3.79 cm    SHUNTS MV Decel Time: 200 msec    Systemic VTI:  0.18 m MV E velocity: 94.90 cm/s  Systemic Diam: 1.80 cm MV A velocity: 90.70 cm/s MV E/A ratio:  1.05 Debbe Odea MD Electronically  signed by Debbe Odea MD Signature Date/Time: 01/23/2023/1:00:43 PM    Final    ASSESSMENT AND PLAN:   47 y.o. 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   Principal Problem:   NSTEMI (non-ST elevated myocardial infarction) (HCC) Active Problems:   Severe hypertension   Electrolyte abnormality   History of CVA (cerebrovascular accident)   Polycythemia   Leukocytosis   Macrocytosis without anemia   Hypertensive urgency   Elevated troponin level   Smoking  *Elevated troponin due to supply/demand ischemia from uncontrolled hypertension.  Non-STEMI ruled out Patient has notable risk factors though, including a very strong family history with nearly all members of her family having their first coronary event in their 30s to 25s, hyperlipidemia, ischemic CVA 2 years ago, daily tobacco and alcohol use.  Due to this, cardiology planning coronary CTA tomorrow   - Telemetry monitoring - Continue home aspirin and Plavix - Nitro as  needed for chest pain - Echocardiogram within normal limit   Severe hypertension Patient has a history of severe hypertension that has become uncontrolled since changing from lisinopril to HCTZ.  She seemed to have much better control with an ACE-I.     - Telemetry monitoring - Discontinue HCTZ - Renal artery duplex concerning for left renal artery stenosis-May need vascular evaluation as an outpatient - Added Norvasc, continue losartan.  Blood pressure has normalized - Continue utilizing as needed labetalol as needed for SBP over 180 or DBP over 110   Electrolyte abnormality Likely due to HCTZ and then additional Lasix use this week.   - Pharmacy managing electrolytes   History of CVA (cerebrovascular accident) At this time, patient's neurological status is at baseline.   - Continue home aspirin, Plavix and statin   Polycythemia Hemoglobin is stable at this time.  Most likely due to tobacco use this previous polycythemia vera workup has been negative.   Leukocytosis Unclear etiology of elevated WBC at this point.  No signs or symptoms to suggest underlying infection.  Likely reactive.   - Repeat CBC in a.m.   Macrocytosis without anemia Improved with replacement of B12 and folate.   - Continue home regimen    Body mass index is 20.1 kg/m.  Net IO Since Admission: 503 mL [01/23/23 1629]      LOS: 0 days   Consultants: Cardiology    Status is: Observation The patient remains OBS appropriate and will d/c before 2 midnights.   DVT prophylaxis:       enoxaparin (LOVENOX) injection 40 mg Start: 01/22/23 1715     Family Communication: (NO "discussed with patient")   All the records are reviewed and case discussed with Nursing and TOC team. Management plans discussed with the patient, cardiology and they are in agreement.  CODE STATUS: Full Code Level of care: Telemetry Cardiac  TOTAL TIME TAKING CARE OF THIS PATIENT: 35 minutes.   More than 50% of the  time was spent in counseling/coordination of care: YES  POSSIBLE D/C IN 1-2 DAYS, DEPENDING ON CLINICAL CONDITION.   Delfino Lovett M.D on 01/23/2023 at 4:29 PM  [facility]   CC: Primary care physician; Smitty Cords, DO  Note: This dictation was prepared with Dragon dictation along with smaller phrase technology. Any transcriptional  errors that result from this process are unintentional.

## 2023-01-23 NOTE — Consult Note (Addendum)
PHARMACY CONSULT NOTE - FOLLOW UP  Pharmacy Consult for Electrolyte Monitoring and Replacement   Recent Labs: Potassium (mmol/L)  Date Value  01/23/2023 2.8 (L)   Magnesium (mg/dL)  Date Value  02/72/5366 1.9   Calcium (mg/dL)  Date Value  44/08/4740 8.3 (L)   Albumin (g/dL)  Date Value  59/56/3875 3.3 (L)   Sodium (mmol/L)  Date Value  01/23/2023 132 (L)    Assessment: Patient admitted for NSTEMI. PMH includes hypertension, CVA, and polycythemia. Pharmacy consulted to manage electrolytes.  Goal of Therapy:  Optimize K>4, Mg > 2 All other electrolytes WNL  Plan:  K = 2.8, Kcl po 40mg  x 1 ordered per MD orders. Will add 20 mEq of KCl IV x 1 dose Follow AM labs  and replace electrolytes as needed.   Joanna Garcia PharmD, BCPS 01/23/2023 11:10 AM   8/4@1512  Patient refused second IV Kcl  (unable to tolerate). Will give and additional Kcl oral instead. Joanna Garcia PharmD, BCPS 01/23/2023 3:14 PM

## 2023-01-23 NOTE — Plan of Care (Signed)

## 2023-01-23 NOTE — Progress Notes (Signed)
  Echocardiogram 2D Echocardiogram has been performed.  Lenor Coffin 01/23/2023, 10:28 AM

## 2023-01-23 NOTE — Plan of Care (Signed)
  Problem: Pain Managment: Goal: General experience of comfort will improve Outcome: Progressing   Problem: Safety: Goal: Ability to remain free from injury will improve Outcome: Progressing   Problem: Skin Integrity: Goal: Risk for impaired skin integrity will decrease Outcome: Progressing   Problem: Coping: Goal: Level of anxiety will decrease Outcome: Progressing   Problem: Nutrition: Goal: Adequate nutrition will be maintained Outcome: Progressing   Problem: Activity: Goal: Risk for activity intolerance will decrease Outcome: Progressing   Problem: Clinical Measurements: Goal: Ability to maintain clinical measurements within normal limits will improve Outcome: Progressing Goal: Will remain free from infection Outcome: Progressing Goal: Diagnostic test results will improve Outcome: Progressing Goal: Respiratory complications will improve Outcome: Progressing Goal: Cardiovascular complication will be avoided Outcome: Progressing

## 2023-01-23 NOTE — Consult Note (Signed)
[facility] Vascular Consult Note  MRN : [id number]  Joanna Garcia is a 47 y.o. ([birth year]) female who presents with chief complaint of  Chief Complaint  Patient presents with   Hypertension   Chest Pain  .  History of Present Illness: Malignant HTN episodes.  Only on Metoprolol and Amlodipine but admitted with CP and headache and severe HTN.  Renal Duplex shows two large equal kidneys but a mild proximal renal artery elevation on the left.  Vascular Surgery is consulted for this finding.  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  Current Facility-Administered Medications  Medication Dose Route Frequency Provider Last Rate Last Admin   acetaminophen (TYLENOL) tablet 650 mg  650 mg Oral Q6H PRN Delfino Lovett, MD       amLODipine (NORVASC) tablet 5 mg  5 mg Oral Daily Hammock, Sheri, NP   5 mg at 01/23/23 1307   aspirin chewable tablet 81 mg  81 mg Oral Daily Verdene Lennert, MD   81 mg at 01/23/23 0825   atorvastatin (LIPITOR) tablet 80 mg  80 mg Oral Daily Verdene Lennert, MD   80 mg at 01/23/23 0825   butalbital-acetaminophen-caffeine (FIORICET) 50-325-40 MG per tablet 1 tablet  1 tablet Oral Q6H PRN Delfino Lovett, MD   1 tablet at 01/23/23 1619   clopidogrel (PLAVIX) tablet 75 mg  75 mg Oral Daily Verdene Lennert, MD   75 mg at 01/23/23 0825   enoxaparin (LOVENOX) injection 40 mg  40 mg Subcutaneous Q24H Verdene Lennert, MD   40 mg at 01/23/23 1612   gabapentin (NEURONTIN) capsule 900 mg  900 mg Oral q morning Verdene Lennert, MD   900 mg at 01/23/23 0825   And   gabapentin (NEURONTIN) capsule 1,200 mg  1,200 mg Oral QPM Verdene Lennert, MD   1,200 mg at 01/23/23 1807   labetalol (NORMODYNE) injection 10 mg  10 mg Intravenous Q2H PRN Verdene Lennert, MD   10 mg at 01/22/23 2055   losartan (COZAAR) tablet 25 mg  25 mg Oral Daily Debbe Odea, MD   25 mg at 01/23/23 1609   melatonin tablet 5 mg  5 mg Oral QHS Lindajo Royal V, MD   5 mg at 01/23/23 0051   nicotine (NICODERM CQ - dosed in mg/24 hours) patch 14 mg  14 mg Transdermal Daily Delfino Lovett, MD   14 mg at 01/23/23 1624   nitroGLYCERIN (NITROSTAT) SL tablet 0.4 mg  0.4 mg Sublingual Q5 min PRN Verdene Lennert, MD       ondansetron (ZOFRAN) tablet 4 mg  4 mg Oral Q6H PRN Verdene Lennert, MD       Or   ondansetron (ZOFRAN) injection 4 mg  4 mg Intravenous Q6H PRN Verdene Lennert, MD   4 mg at 01/23/23 1334   pantoprazole (PROTONIX) EC tablet 40 mg  40 mg Oral QAC breakfast Verdene Lennert, MD   40 mg at 01/23/23 0825   polyethylene glycol (MIRALAX / GLYCOLAX) packet 17 g  17 g Oral Daily Delfino Lovett, MD   17 g at 01/23/23 1308   promethazine (PHENERGAN) 25 mg in sodium chloride 0.9 % 50 mL IVPB  25 mg Intravenous Q6H PRN Delfino Lovett, MD 200 mL/hr at 01/23/23 1622 25 mg at 01/23/23 1622   senna-docusate (Senokot-S) tablet 2 tablet  2 tablet Oral BID Sherryll Burger, Vipul,  MD   2 tablet at 01/23/23 1307   sodium chloride flush (NS) 0.9 % injection 3 mL  3 mL Intravenous Q12H Verdene Lennert, MD   3 mL at 01/23/23 0826   traMADol (ULTRAM) tablet 50 mg  50 mg Oral Q6H PRN Andris Baumann, MD   50 mg at 01/22/23 2217    Past Medical History:  Diagnosis Date   DVT (deep venous thrombosis) (HCC)    GERD (gastroesophageal reflux disease)    Hypertension    Polycythemia    Polyneuropathy    Stroke [facility])     Past Surgical History:  Procedure Laterality Date   COLONOSCOPY N/A 03/16/2019   Procedure: COLONOSCOPY;  Surgeon: Toledo, Boykin Nearing, MD;  Location: [facility] ENDOSCOPY;  Service: Gastroenterology;  Laterality: N/A;   ESOPHAGOGASTRODUODENOSCOPY N/A 03/16/2019   Procedure: ESOPHAGOGASTRODUODENOSCOPY (EGD);  Surgeon: Toledo, Boykin Nearing, MD;  Location: [facility] ENDOSCOPY;  Service: Gastroenterology;  Laterality: N/A;   IR ANGIO INTRA EXTRACRAN SEL COM CAROTID INNOMINATE UNI L MOD SED  02/09/2021    IR INTRAVSC STENT CERV CAROTID W/EMB-PROT MOD SED INCL ANGIO  02/09/2021   IR PERCUTANEOUS ART THROMBECTOMY/INFUSION INTRACRANIAL INC DIAG ANGIO  02/09/2021   IR US GUIDE VASC ACCESS RIGHT  02/09/2021   NO PAST SURGERIES     RADIOLOGY WITH ANESTHESIA N/A 02/09/2021   Procedure: IR WITH ANESTHESIA;  Surgeon: Radiologist, Medication, MD;  Location: [facility] OR;  Service: Radiology;  Laterality: N/A;    Social History Social History   Tobacco Use   Smoking status: Every Day    Current packs/day: 0.75    Average packs/day: 0.7 packs/day for 30.6 years (22.9 ttl pk-yrs)    Types: Cigarettes    Start date: 06/21/1992   Smokeless tobacco: Never  Vaping Use   Vaping status: Never Used  Substance Use Topics   Alcohol use: Yes    Comment: 24 Oz daily   Drug use: Not Currently    Types: Marijuana    Family History Family History  Problem Relation Age of Onset   Hypertension Mother    Hyperlipidemia Mother    Heart disease Mother    COPD Mother    Hemochromatosis Mother    Hypertension Father    Hyperlipidemia Father    Cancer Father        prostate   Hemochromatosis Maternal Aunt    Hemochromatosis Maternal Aunt    Hemochromatosis Maternal Uncle    Leukemia Paternal Uncle    Cancer Paternal Grandmother    Breast cancer Paternal Grandmother     No Known Allergies   REVIEW OF SYSTEMS (Negative unless checked)  Constitutional: [] Weight loss  [] Fever  [] Chills Cardiac: [x] Chest pain   [] Chest pressure   [] Palpitations   [] Shortness of breath when laying flat   [] Shortness of breath at rest   [] Shortness of breath with exertion. Vascular:  [] Pain in legs with walking   [] Pain in legs at rest   [] Pain in legs when laying flat   [] Claudication   [] Pain in feet when walking  [] Pain in feet at rest  [] Pain in feet when laying flat   [] History of DVT   [] Phlebitis   [] Swelling in legs   [] Varicose veins   [] Non-healing ulcers Pulmonary:   [] Uses home oxygen   [] Productive cough    [] Hemoptysis   [] Wheeze  [] COPD   [] Asthma Neurologic:  [x] Dizziness  [] Blackouts   [] Seizures   [x] History of stroke   [] History of TIA  [] Aphasia   [] Temporary blindness   []   Dysphagia   [] Weakness or numbness in arms   [] Weakness or numbness in legs Musculoskeletal:  [] Arthritis   [] Joint swelling   [] Joint pain   [] Low back pain Hematologic:  [x] Easy bruising  [] Easy bleeding   [] Hypercoagulable state   [] Anemic  [] Hepatitis Gastrointestinal:  [] Blood in stool   [] Vomiting blood  [] Gastroesophageal reflux/heartburn   [] Difficulty swallowing. Genitourinary:  [] Chronic kidney disease   [] Difficult urination  [] Frequent urination  [] Burning with urination   [] Blood in urine Skin:  [] Rashes   [] Ulcers   [] Wounds Psychological:  [x] History of anxiety   []  History of major depression.  Physical Examination  Vitals:   01/23/23 0845 01/23/23 2000 01/23/23 2036 01/23/23 2038  BP: (!) 122/92  (!) 142/68   Pulse: 72  72 66  Resp:  13 18   Temp: 98.3 F (36.8 C)  98 F (36.7 C)   TempSrc: Oral  Oral   SpO2: 98%  (!) 87% 96%  Weight:      Height:       Body mass index is 20.1 kg/m. Gen:  WD/WN, NAD Head: [place]/AT, No temporalis wasting. Prominent temp pulse not noted. Ear/Nose/Throat: Hearing grossly intact, nares w/o erythema or drainage, oropharynx w/o Erythema/Exudate Eyes: Sclera non-icteric, conjunctiva clear Neck: Trachea midline.  No JVD.  Pulmonary:  Good air movement, respirations not labored, equal bilaterally.  Cardiac: RRR, normal S1, S2. Vascular:  Vessel Right Left  Radial Palpable Palpable  Ulnar Palpable Palpable  Brachial Palpable Palpable  Carotid Palpable, without bruit Palpable, without bruit  Aorta Not palpable N/A  Femoral Reduced but Palpable Palpable  Popliteal Palpable Palpable  PT Palpable Palpable  DP Palpable Palpable   Gastrointestinal: soft, non-tender/non-distended. No guarding/reflex.  Musculoskeletal: M/S 5/5 throughout.  Extremities without ischemic  changes.  No deformity or atrophy. No edema. Neurologic: Sensation grossly intact in extremities.  Symmetrical.  Speech is fluent. Motor exam as listed above. Psychiatric: Judgment intact, Mood & affect appropriate for pt's clinical situation. Dermatologic: No rashes or ulcers noted.  No cellulitis or open wounds. Lymph : No Cervical, Axillary, or Inguinal lymphadenopathy.     CBC Lab Results  Component Value Date   WBC 6.7 01/23/2023   HGB 13.9 01/23/2023   HCT 39.5 01/23/2023   MCV 97.3 01/23/2023   PLT 216 01/23/2023    BMET    Component Value Date/Time   NA 133 (L) 01/23/2023 1434   K 4.0 01/23/2023 1434   CL 91 (L) 01/23/2023 1434   CO2 32 01/23/2023 1434   GLUCOSE 93 01/23/2023 1434   BUN 9 01/23/2023 1434   CREATININE 0.94 01/23/2023 1434   CREATININE 0.92 12/22/2022 0948   CALCIUM 8.7 (L) 01/23/2023 1434   GFRNONAA >60 01/23/2023 1434   GFRAA >60 06/05/2019 1710   Estimated Creatinine Clearance: 65.4 mL/min (by C-G formula based on SCr of 0.94 mg/dL).  COAG Lab Results  Component Value Date   INR 1.0 01/22/2023   INR 1.1 02/10/2021   INR 1.0 02/09/2021    Radiology US RENAL ARTERY DUPLEX COMPLETE  Result Date: 01/23/2023 CLINICAL DATA:  Hypertension EXAM: RENAL/URINARY TRACT ULTRASOUND RENAL DUPLEX DOPPLER ULTRASOUND COMPARISON:  None available FINDINGS: Right Kidney: Length: 11.5 cm. Echogenicity within normal limits. No mass or hydronephrosis visualized. Left Kidney: Length: 11.1 cm. Echogenicity within normal limits. No mass or hydronephrosis visualized. Bladder:  Unremarkable RENAL DUPLEX ULTRASOUND Right Renal Artery Velocities: Origin:  101 cm/sec Mid:  107 cm/sec Hilum:  73 cm/sec Interlobar:  40 cm/sec Arcuate:  34 cm/sec Left Renal Artery Velocities: Origin:  236 cm/sec Mid:  106 cm/sec Hilum:  122 cm/sec Interlobar:  28 cm/sec Arcuate:  22 cm/sec Aortic Velocity:  66 cm/sec Right Renal-Aortic Ratios: Origin: 1.5 Mid:  1.6 Hilum: 1.1 Interlobar: 0.6  Arcuate: 0.5 Left Renal-Aortic Ratios: Origin: 3.7 Mid: 1.6 Hilum: 1.8 Interlobar: 0.4 Arcuate: 7.3 IMPRESSION: Mildly elevated velocity of the origin of the left main renal artery is suspicious for stenosis. Electronically Signed   By: Acquanetta Belling M.D.   On: 01/23/2023 13:04   ECHOCARDIOGRAM COMPLETE  Result Date: 01/23/2023    ECHOCARDIOGRAM REPORT   Patient Name:   MANAHIL VANZILE Ducharme Date of Exam: 01/23/2023 Medical Rec #:  [id number]       Height:       65.0 in Accession #:    [id number]      Weight:       120.8 lb Date of Birth:  1976/03/30       BSA:          1.597 m Patient Age:    47 years        BP:           98/77 mmHg Patient Gender: F               HR:           72 bpm. Exam Location:  [facility] Procedure: 2D Echo and Strain Analysis Indications:     NSTEMI I21.4                  Chest Pain R07.9  History:         Patient has prior history of Echocardiogram examinations, most                  recent 02/10/2021.  Sonographer:     Overton Mam RDCS, FASE Referring Phys:  [id number] Verdene Lennert Diagnosing Phys: Debbe Odea MD  Sonographer Comments: Global longitudinal strain was attempted. IMPRESSIONS  1. Left ventricular ejection fraction, by estimation, is 55 to 60%. The left ventricle has normal function. The left ventricle has no regional wall motion abnormalities. Left ventricular diastolic parameters were normal. The average left ventricular global longitudinal strain is -16.7 %. The global longitudinal strain is normal.  2. Right ventricular systolic function is normal. The right ventricular size is normal.  3. The mitral valve is normal in structure. Mild mitral valve regurgitation.  4. The aortic valve was not well visualized. Aortic valve regurgitation is not visualized. Aortic valve sclerosis is present, with no evidence of aortic valve stenosis.  5. The inferior vena cava is normal in size with greater than 50% respiratory variability, suggesting right atrial pressure of 3 mmHg. FINDINGS  Left  Ventricle: Left ventricular ejection fraction, by estimation, is 55 to 60%. The left ventricle has normal function. The left ventricle has no regional wall motion abnormalities. The average left ventricular global longitudinal strain is -16.7 %. The global longitudinal strain is normal. The left ventricular internal cavity size was normal in size. There is no left ventricular hypertrophy. Left ventricular diastolic parameters were normal. Right Ventricle: The right ventricular size is normal. No increase in right ventricular wall thickness. Right ventricular systolic function is normal. Left Atrium: Left atrial size was normal in size. Right Atrium: Right atrial size was normal in size. Pericardium: There is no evidence of pericardial effusion. Mitral Valve: The mitral valve is normal in structure. Mild mitral valve regurgitation. Tricuspid Valve: The tricuspid valve is  normal in structure. Tricuspid valve regurgitation is not demonstrated. Aortic Valve: The aortic valve was not well visualized. Aortic valve regurgitation is not visualized. Aortic valve sclerosis is present, with no evidence of aortic valve stenosis. Aortic valve peak gradient measures 10.5 mmHg. Pulmonic Valve: The pulmonic valve was normal in structure. Pulmonic valve regurgitation is not visualized. Aorta: The aortic root is normal in size and structure. Venous: The inferior vena cava is normal in size with greater than 50% respiratory variability, suggesting right atrial pressure of 3 mmHg. IAS/Shunts: No atrial level shunt detected by color flow Doppler.  LEFT VENTRICLE PLAX 2D LVIDd:         4.20 cm   Diastology LVIDs:         2.80 cm   LV e' medial:    11.50 cm/s LV PW:         0.90 cm   LV E/e' medial:  8.3 LV IVS:        1.00 cm   LV e' lateral:   11.90 cm/s LVOT diam:     1.80 cm   LV E/e' lateral: 8.0 LV SV:         45 LV SV Index:   28        2D Longitudinal Strain LVOT Area:     2.54 cm  2D Strain GLS Avg:     -16.7 %  RIGHT VENTRICLE  RV Basal diam:  1.90 cm RV S prime:     14.50 cm/s TAPSE (M-mode): 2.2 cm LEFT ATRIUM             Index        RIGHT ATRIUM          Index LA diam:        2.30 cm 1.44 cm/m   RA Area:     8.16 cm LA Vol (A2C):   28.8 ml 18.04 ml/m  RA Volume:   15.30 ml 9.58 ml/m LA Vol (A4C):   29.0 ml 18.16 ml/m LA Biplane Vol: 29.7 ml 18.60 ml/m  AORTIC VALVE                 PULMONIC VALVE AV Area (Vmax): 1.59 cm     PV Vmax:        1.01 m/s AV Vmax:        162.00 cm/s  PV Peak grad:   4.1 mmHg AV Peak Grad:   10.5 mmHg    RVOT Peak grad: 4 mmHg LVOT Vmax:      101.00 cm/s LVOT Vmean:     61.800 cm/s LVOT VTI:       0.175 m  AORTA Ao Root diam: 3.10 cm MITRAL VALVE MV Area (PHT): 3.79 cm    SHUNTS MV Decel Time: 200 msec    Systemic VTI:  0.18 m MV E velocity: 94.90 cm/s  Systemic Diam: 1.80 cm MV A velocity: 90.70 cm/s MV E/A ratio:  1.05 Debbe Odea MD Electronically signed by Debbe Odea MD Signature Date/Time: 01/23/2023/1:00:43 PM    Final    CT HEAD WO CONTRAST ( )  Result Date: 01/22/2023 CLINICAL DATA:  numbness EXAM: CT HEAD WITHOUT CONTRAST TECHNIQUE: Contiguous axial images were obtained from the base of the skull through the vertex without intravenous contrast. RADIATION DOSE REDUCTION: This exam was performed according to the departmental dose-optimization program which includes automated exposure control, adjustment of the mA and/or kV according to patient size and/or use of iterative reconstruction technique. COMPARISON:  Brain MR  02/10/2021 FINDINGS: Brain: No evidence of acute infarction, hemorrhage, hydrocephalus, extra-axial collection or mass lesion/mass effect. Chronic infarct in the right parietal lobe (series 2, image 14). Vascular: No hyperdense vessel or unexpected calcification. Skull: Normal. Negative for fracture or focal lesion. Sinuses/Orbits: No middle ear or mastoid effusion. Paranasal sinuses are clear. Orbits are unremarkable. Other: None. IMPRESSION: No acute intracranial  abnormality. Electronically Signed   By: Lorenza Cambridge M.D.   On: 01/22/2023 11:52   DG Chest 2 View  Result Date: 01/22/2023 CLINICAL DATA:  Chest pain EXAM: CHEST - 2 VIEW COMPARISON:  03/14/2019 FINDINGS: Cardiac size is within normal limits. There are no signs of pulmonary edema or focal pulmonary consolidation. Increase in AP diameter of chest suggests COPD. Emphysematous bullae are seen in the right upper lung field. IMPRESSION: COPD. There are no signs of pulmonary edema or focal pulmonary consolidation. Electronically Signed   By: Ernie Avena M.D.   On: 01/22/2023 11:50      Assessment/Plan 1. HTN on 2 drugs but has severe episodes.  She has some issues with non-compliance.  She has a mild left proximal renal artery stenosis by Duplex with normal renal size and bilateral normal kidney parenchyma.  Because she has an atherosclerotic diathesis, I would confirm the lesion with a CTA before recommending arteriography and stenting.  I predict it will be mild disease and not worth treating but it needs to be anatomically confirmed by 2 noninvasive tests to be certain it has been accurately assessed.  2. Tobacco abuse:  Needs to quit.  Is on appropriate medical therapy for atherosclerosis.  3. Right carotid stent:  Not sure if anyone is following this.  Iline Oven, MD  01/23/2023 9:43 PM

## 2023-01-23 NOTE — Consult Note (Signed)
Cardiology Consultation   Patient ID: Joanna Garcia MRN: [id number]; DOB: [birth year]  Admit date: 01/22/2023 Date of Consult: 01/23/2023  PCP:  Smitty Cords, DO   [facility] Providers Cardiologist:  None      New consult done by Dr. Azucena Cecil  Patient Profile:   Joanna Garcia is a 47 y.o. 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 Huel Cote.  History of Present Illness:   Joanna Garcia stated that it for the last week she has been experiencing elevated blood pressures of over 200, headache, overall ringing sound in her ears, and experienced palpitations that been persistent.  She had suffered from some chest tightness that started to move down her left arm when her blood pressure was noted to be above 200.  She had called her PCPs office on 2 separate occasions and stated that she had not gotten a response that she wanted to know if she could double up her HCTZ.  She stated that her father had passed away from a adverse medication effects and doubling up on medication and she was nervous about doubling her dose.  She did take approximately 2 doses of her mother's furosemide.  She presented to [name] on 01/22/2023 via EMS from home for elevated blood pressures.  She had complained of chest tightness with left arm heaviness that woke her up.  Saying that she had gone to bed around 2200.  She was worried about the left arm heaviness as she does have a history of a CVA.  She was given 2 inches of Nitropaste, 4 mg of Zofran prior to arrival.  Stated that she had previously been taking lisinopril but her doctor did recently  taken her off of that and placed her on HCTZ.  Blood pressure continued to remain high.  She denied chest pain or pressure until the morning that her blood pressure was extremely elevated.  She continued to complain of a headache consistently for approximately a week.  She did have a prior stroke but did not have any neurological deficits after.  Initial vital signs: Blood pressure 176/110, pulse 115, respirations 19, temperature 98.5  Pertinent labs: Sodium 130, potassium 2.9, chloride of 87, blood glucose of 109, calcium 8.6, hemoglobin 15.9, troponin of 27 and 109  Imaging: Noncontrasted head CT with no evidence of acute intracranial hemorrhage or neoplasm, chest x-ray without acute abnormalities such as pneumonia or pneumothorax or pulmonary edema  Medications administered in the emergency department: Potassium chloride 40 mEq, normal saline 50 mL, oxycodone 5/325 2 tablets, and labetalol 10 mg IVP  Cardiology was consulted for elevated high-sensitivity troponins and hypertensive urgency.   Past Medical History:  Diagnosis Date   DVT (deep venous thrombosis) (HCC)    GERD (gastroesophageal reflux disease)    Hypertension    Polycythemia    Polyneuropathy    Stroke [facility])     Past Surgical History:  Procedure Laterality Date   COLONOSCOPY N/A 03/16/2019   Procedure: COLONOSCOPY;  Surgeon: Toledo, Boykin Nearing, MD;  Location: [facility] ENDOSCOPY;  Service: Gastroenterology;  Laterality: N/A;   ESOPHAGOGASTRODUODENOSCOPY N/A 03/16/2019   Procedure: ESOPHAGOGASTRODUODENOSCOPY (EGD);  Surgeon: Toledo, Boykin Nearing, MD;  Location: [facility] ENDOSCOPY;  Service: Gastroenterology;  Laterality: N/A;   IR ANGIO INTRA EXTRACRAN SEL COM CAROTID INNOMINATE UNI L MOD SED  02/09/2021   IR INTRAVSC STENT CERV CAROTID W/EMB-PROT MOD SED INCL ANGIO  02/09/2021   IR PERCUTANEOUS ART THROMBECTOMY/INFUSION INTRACRANIAL INC DIAG ANGIO  02/09/2021   IR US GUIDE VASC ACCESS RIGHT  02/09/2021   NO PAST SURGERIES     RADIOLOGY  WITH ANESTHESIA N/A 02/09/2021   Procedure: IR WITH ANESTHESIA;  Surgeon: Radiologist, Medication, MD;  Location: [facility] OR;  Service: Radiology;  Laterality: N/A;     Home Medications:  Prior to Admission medications   Medication Sig Start Date End Date Taking? Authorizing Provider  aspirin 81 MG chewable tablet Chew and swallow 1 tablet (81 mg total) by mouth daily. 02/12/21  Yes Rinehuls, Kinnie Scales, PA-C  atorvastatin (LIPITOR) 80 MG tablet Take 1 tablet (80 mg total) by mouth daily. 12/22/22  Yes Karamalegos, Netta Neat, DO  clopidogrel (PLAVIX) 75 MG tablet Take 1 tablet (75 mg total) by mouth daily. 11/17/22  Yes Karamalegos, Netta Neat, DO  gabapentin (NEURONTIN) 300 MG capsule Take 3 caps = 900mg  dose in morning and afternoon and take 4 caps = 1200mg  in evening. Max dose is 10 capsules in 24 hours. 11/09/22  Yes Karamalegos, Netta Neat, DO  hydrochlorothiazide (HYDRODIURIL) 25 MG tablet Take 1 tablet (25 mg total) by mouth daily. Patient taking differently: Take 12.5 mg by mouth daily. 01/20/23  Yes Karamalegos, Netta Neat, DO  meloxicam (MOBIC) 15 MG tablet Take 1 tablet (15 mg total) by mouth daily as needed for pain (back pain). Patient taking differently: Take 15 mg by mouth daily. 10/19/22  Yes Karamalegos, Netta Neat, DO  ondansetron (ZOFRAN-ODT) 4 MG disintegrating tablet Take 1 tablet (4 mg total) by mouth every 8 (eight) hours as needed for nausea or vomiting. 10/19/22  Yes Karamalegos, Netta Neat, DO  pantoprazole (PROTONIX) 40 MG tablet Take 1 tablet (40 mg total) by mouth daily before breakfast. 11/18/22  Yes Karamalegos, Netta Neat, DO  tiZANidine (ZANAFLEX) 4 MG tablet Take 1 tablet (4 mg total) by mouth every 8 (eight) hours as needed for muscle spasms. 10/19/22  Yes Karamalegos, Netta Neat, DO  diclofenac Sodium (VOLTAREN) 1 % GEL Apply 2 g topically 2 (two) times daily as needed. To affected joint. Patient not taking: Reported on 01/22/2023 11/25/22   Jerrol Banana, MD  DULoxetine  (CYMBALTA) 30 MG capsule Take 1 capsule (30 mg total) by mouth every evening for 14 days, THEN 2 capsules (60 mg total) every evening for 16 days. CONTACT us FOR REFILL AT 60 MG AFTER THIS RX. Patient not taking: Reported on 12/22/2022 12/10/22 01/09/23  Jerrol Banana, MD  folic acid (FOLVITE) 1 MG tablet TAKE 2 TABLETS BY MOUTH DAILY. Patient not taking: Reported on 01/22/2023 04/08/21   Creig Hines, MD  lidocaine (LIDODERM) 5 % Place 1 patch onto the skin every 12 (twelve) hours. Remove & Discard patch within 12 hours or as directed by MD Patient not taking: Reported on 01/22/2023 11/25/22   Jerrol Banana, MD    Inpatient Medications: Scheduled Meds:  aspirin  81 mg Oral Daily   atorvastatin  80 mg Oral Daily   clopidogrel  75 mg Oral Daily   enoxaparin (LOVENOX) injection  40 mg Subcutaneous Q24H   gabapentin  900 mg Oral q morning   And   gabapentin  1,200 mg Oral QPM   melatonin  5 mg Oral QHS   nicotine  14 mg Transdermal Once   pantoprazole  40 mg Oral QAC breakfast   sodium chloride flush  3 mL Intravenous Q12H   Continuous Infusions:  PRN Meds: acetaminophen, labetalol, nitroGLYCERIN, ondansetron **OR** ondansetron (ZOFRAN) IV, polyethylene glycol, traMADol  Allergies:   No Known Allergies  Social History:   Social History   Socioeconomic History   Marital status: Divorced    Spouse name: Not on file   Number of children: Not on file   Years of education: Not on file   Highest education level: 11th grade  Occupational History   Not on file  Tobacco Use   Smoking status: Every Day    Current packs/day: 0.75    Average packs/day: 0.7 packs/day for 30.6 years (22.9 ttl pk-yrs)    Types: Cigarettes    Start date: 06/21/1992   Smokeless tobacco: Never  Vaping Use   Vaping status: Never Used  Substance and Sexual Activity   Alcohol use: Yes    Comment: 24 Oz daily   Drug use: Not Currently    Types: Marijuana   Sexual activity: Not Currently  Other Topics  Concern   Not on file  Social History Narrative   Lives at home with mother and 2 dogs Charlie and Clauie    Drives   Social Determinants of Health   Financial Resource Strain: Medium Risk (12/06/2022)   Overall Financial Resource Strain (CARDIA)    Difficulty of Paying Living Expenses: Somewhat hard  Food Insecurity: No Food Insecurity (01/22/2023)   Hunger Vital Sign    Worried About Running Out of Food in the Last Year: Never true    Ran Out of Food in the Last Year: Never true  Transportation Needs: No Transportation Needs (01/22/2023)   PRAPARE - Administrator, Civil Service (Medical): No    Lack of Transportation (Non-Medical): No  Physical Activity: Sufficiently Active (12/06/2022)   Exercise Vital Sign    Days of Exercise per Week: 5 days    Minutes of Exercise per Session: 30 min  Stress: No Stress Concern Present (12/06/2022)   [employer] of Occupational Health - Occupational Stress Questionnaire    Feeling of Stress : Not at all  Social Connections: Moderately Isolated (12/06/2022)   Social Connection and Isolation Panel [NHANES]    Frequency of Communication with Friends and Family: More than three times a week    Frequency of Social Gatherings with Friends and Family: Three times a week    Attends Religious Services: 1 to 4 times per year    Active Member of Clubs or Organizations: No    Attends Banker Meetings: Not on file    Marital Status: Divorced  Intimate Partner Violence: Not At Risk (01/22/2023)   Humiliation, Afraid, Rape, and Kick questionnaire    Fear of Current or Ex-Partner: No    Emotionally Abused: No    Physically Abused: No    Sexually Abused: No    Family History:    Family History  Problem Relation Age of Onset   Hypertension Mother    Hyperlipidemia Mother    Heart disease Mother    COPD Mother    Hemochromatosis Mother    Hypertension Father    Hyperlipidemia Father    Cancer Father        prostate    Hemochromatosis Maternal Aunt    Hemochromatosis Maternal Aunt    Hemochromatosis Maternal Uncle    Leukemia Paternal Uncle    Cancer Paternal Grandmother  Breast cancer Paternal Grandmother      ROS:  Please see the history of present illness.  Review of Systems  Constitutional:  Positive for malaise/fatigue.  Cardiovascular:  Positive for chest pain and palpitations.  Neurological:  Positive for dizziness, focal weakness and headaches.    All other ROS reviewed and negative.     Physical Exam/Data:   Vitals:   01/22/23 2212 01/22/23 2340 01/23/23 0354 01/23/23 0500  BP: 118/88 (!) 131/92 98/77   Pulse: 73 75 72   Resp:  18 18   Temp:  98 F (36.7 C) 98.5 F (36.9 C)   TempSrc:  Oral Oral   SpO2:  98% 97%   Weight:    54.8 kg  Height:        Intake/Output Summary (Last 24 hours) at 01/23/2023 0826 Last data filed at 01/22/2023 2100 Gross per 24 hour  Intake 503 ml  Output --  Net 503 ml      01/23/2023    5:00 AM 01/22/2023   12:23 PM 12/22/2022    9:11 AM  Last 3 Weights  Weight (lbs) 120 lb 13 oz 120 lb 120 lb  Weight (kg) 54.8 kg 54.432 kg 54.432 kg     Body mass index is 20.1 kg/m.  General:  Well nourished, well developed, in no acute distress HEENT: normal Neck: no JVD Vascular: No carotid bruits; Distal pulses 2+ bilaterally Cardiac:  normal S1, S2; RRR; II-III/VI murmur best heard at the RUSB, radiates into the carotids Lungs:  clear to auscultation bilaterally, no wheezing, rhonchi or rales  Abd: soft, nontender, no hepatomegaly  Ext: no edema Musculoskeletal:  No deformities, BUE and BLE strength normal and equal Skin: warm and dry  Neuro:  CNs 2-12 intact, no focal abnormalities noted Psych:  Normal affect   EKG:  The EKG was personally reviewed and demonstrates: Sinus tach with a rate of 121, biatrial enlargement, LVH, nonspecific T wave changes Telemetry:  Telemetry was personally reviewed and demonstrates: Sinus rhythm with rates of  60-70  Relevant CV Studies: TTE 02/10/21 1. Left ventricular ejection fraction, by estimation, is 60 to 65%. The  left ventricle has normal function. The left ventricle has no regional  wall motion abnormalities. Left ventricular diastolic parameters were  normal.   2. Right ventricular systolic function is normal. The right ventricular  size is normal. Tricuspid regurgitation signal is inadequate for assessing  PA pressure.   3. Mild subvalvular calcification. The mitral valve is grossly normal.  Trivial mitral valve regurgitation. No evidence of mitral stenosis.   4. The aortic valve is normal in structure. Aortic valve regurgitation is  not visualized. No aortic stenosis is present.   5. The inferior vena cava is normal in size with <50% respiratory  variability, suggesting right atrial pressure of 8 mmHg.   TTE 03/15/19 1. Left ventricular ejection fraction, by visual estimation, is 60 to  65%. The left ventricle has normal function. Normal left ventricular size.  There is no left ventricular hypertrophy.   2. Global right ventricle has normal systolic function.The right  ventricular size is normal. No increase in right ventricular wall  thickness.   3. Left atrial size was normal.   4. TR signal is inadequate for assessing pulmonary artery systolic  pressure.   5. The inferior vena cava is normal in size with greater than 50%  respiratory variability, suggesting right atrial pressure of 3 mmHg.   Laboratory Data:  High Sensitivity Troponin:   Recent  Labs  Lab 01/22/23 1136 01/22/23 1329 01/22/23 1827 01/22/23 2057  TROPONINIHS 27* 109* 92* 49*     Chemistry Recent Labs  Lab 01/22/23 1136 01/22/23 1730 01/23/23 0524  NA 130*  --  132*  K 2.9*  --  2.8*  CL 87*  --  91*  CO2 29  --  30  GLUCOSE 109*  --  105*  BUN 9  --  10  CREATININE 0.93  --  0.98  CALCIUM 8.6*  --  8.3*  MG  --  1.9  --   GFRNONAA >60  --  >60  ANIONGAP 14  --  11    Recent Labs  Lab  01/23/23 0524  PROT 5.8*  ALBUMIN 3.3*  AST 20  ALT 12  ALKPHOS 66  BILITOT 1.0   Lipids  Recent Labs  Lab 01/22/23 1730  CHOL 218*  TRIG 203*  HDL 81  LDLCALC 96  CHOLHDL 2.7    Hematology Recent Labs  Lab 01/22/23 1136 01/23/23 0524  WBC 14.7* 6.7  RBC 4.68 4.06  HGB 15.9* 13.9  HCT 44.8 39.5  MCV 95.7 97.3  MCH 34.0 34.2*  MCHC 35.5 35.2  RDW 13.2 13.2  PLT 270 216   Thyroid  Recent Labs  Lab 01/22/23 1827  TSH 4.344    BNP Recent Labs  Lab 01/22/23 1709  BNP 17.0    DDimer No results for input(s): "DDIMER" in the last 168 hours.   Radiology/Studies:  CT HEAD WO CONTRAST ( )  Result Date: 01/22/2023 CLINICAL DATA:  numbness EXAM: CT HEAD WITHOUT CONTRAST TECHNIQUE: Contiguous axial images were obtained from the base of the skull through the vertex without intravenous contrast. RADIATION DOSE REDUCTION: This exam was performed according to the departmental dose-optimization program which includes automated exposure control, adjustment of the mA and/or kV according to patient size and/or use of iterative reconstruction technique. COMPARISON:  Brain MR 02/10/2021 FINDINGS: Brain: No evidence of acute infarction, hemorrhage, hydrocephalus, extra-axial collection or mass lesion/mass effect. Chronic infarct in the right parietal lobe (series 2, image 14). Vascular: No hyperdense vessel or unexpected calcification. Skull: Normal. Negative for fracture or focal lesion. Sinuses/Orbits: No middle ear or mastoid effusion. Paranasal sinuses are clear. Orbits are unremarkable. Other: None. IMPRESSION: No acute intracranial abnormality. Electronically Signed   By: Lorenza Cambridge M.D.   On: 01/22/2023 11:52   DG Chest 2 View  Result Date: 01/22/2023 CLINICAL DATA:  Chest pain EXAM: CHEST - 2 VIEW COMPARISON:  03/14/2019 FINDINGS: Cardiac size is within normal limits. There are no signs of pulmonary edema or focal pulmonary consolidation. Increase in AP diameter of chest  suggests COPD. Emphysematous bullae are seen in the right upper lung field. IMPRESSION: COPD. There are no signs of pulmonary edema or focal pulmonary consolidation. Electronically Signed   By: Ernie Avena M.D.   On: 01/22/2023 11:50     Assessment and Plan:   Uncontrolled hypertension -History of severe hypertension that has become uncontrolled since changing from lisinopril to hydrochlorothiazide -Patient states blood pressures have been running upwards of over 200/100 -Blood pressure improved after as needed labetalol to 122/92 -Will start on 5 mg of Norvasc daily -Evaluate for hyperaldosteronism with elevated blood pressures and hypokalemia, labs pending -Vital signs per unit protocol  Elevated high-sensitivity troponin -Patient presented with chest tightness in light of hypertensive urgency -High-sensitivity troponins trended 27, 109, 92, 49 -No indication for heparin infusion -Likely demand ischemia from hypertensive urgency -Currently chest pain-free -With strong  family history of coronary artery disease she has been scheduled for coronary CTA tomorrow -100 mg of metoprolol tartrate to be given 30 minutes prior to procedure -Continue on telemetry monitoring -Continued on aspirin and Plavix -Echocardiogram ordered and pending with further recommendations to follow  Hypokalemia -Serum potassium 2.8 -Potassium supplements given in the emergency department -More ordered by primary team this morning -Repeat BMP requested -Recommend keeping potassium level greater than 4 less than 5 -Daily BMP  History of CVA -No deficits -Continued on aspirin, clopidogrel, statin therapy -Follows with neurology as outpatient  Tobacco abuse -Nicotine patch ordered -Cessation is recommended   Risk Assessment/Risk Scores:                For questions or updates, please contact [facility] Please consult [email or web address] for contact info under    Signed, Mckell Riecke, NP  01/23/2023 8:26 AM

## 2023-01-24 ENCOUNTER — Encounter
Admission: EM | Disposition: A | Discharge status: Home / Self Care | Payer: Self-pay | Source: Home / Self Care | Attending: Internal Medicine

## 2023-01-24 ENCOUNTER — Observation Stay: Payer: 59

## 2023-01-24 DIAGNOSIS — I701 Atherosclerosis of renal artery: Secondary | ICD-10-CM | POA: Diagnosis not present

## 2023-01-24 DIAGNOSIS — I708 Atherosclerosis of other arteries: Secondary | ICD-10-CM

## 2023-01-24 DIAGNOSIS — Z8673 Personal history of transient ischemic attack (TIA), and cerebral infarction without residual deficits: Secondary | ICD-10-CM | POA: Diagnosis not present

## 2023-01-24 DIAGNOSIS — D72829 Elevated white blood cell count, unspecified: Secondary | ICD-10-CM | POA: Diagnosis not present

## 2023-01-24 DIAGNOSIS — I150 Renovascular hypertension: Secondary | ICD-10-CM

## 2023-01-24 DIAGNOSIS — I10 Essential (primary) hypertension: Secondary | ICD-10-CM | POA: Diagnosis not present

## 2023-01-24 DIAGNOSIS — R7989 Other specified abnormal findings of blood chemistry: Secondary | ICD-10-CM | POA: Diagnosis not present

## 2023-01-24 DIAGNOSIS — E878 Other disorders of electrolyte and fluid balance, not elsewhere classified: Secondary | ICD-10-CM | POA: Diagnosis not present

## 2023-01-24 DIAGNOSIS — I214 Non-ST elevation (NSTEMI) myocardial infarction: Secondary | ICD-10-CM | POA: Diagnosis not present

## 2023-01-24 DIAGNOSIS — Z8679 Personal history of other diseases of the circulatory system: Secondary | ICD-10-CM

## 2023-01-24 HISTORY — PX: RENAL ANGIOGRAPHY: CATH118260

## 2023-01-24 SURGERY — RENAL ANGIOGRAPHY
Anesthesia: Moderate Sedation | Laterality: Left

## 2023-01-24 MED ORDER — ONDANSETRON HCL 4 MG/2ML IJ SOLN: 4.0000 mg | Freq: Four times a day (QID) | INTRAMUSCULAR | Status: DC | PRN

## 2023-01-24 MED ORDER — MIDAZOLAM HCL 2 MG/2ML IJ SOLN
INTRAMUSCULAR | Status: AC
  Filled 2023-01-24: qty 2

## 2023-01-24 MED ORDER — CEFAZOLIN SODIUM-DEXTROSE 1-4 GM/50ML-% IV SOLN
INTRAVENOUS | Status: AC | PRN
  Administered 2023-01-24: 2 g via INTRAVENOUS

## 2023-01-24 MED ORDER — FENTANYL CITRATE PF 50 MCG/ML IJ SOSY
PREFILLED_SYRINGE | INTRAMUSCULAR | Status: AC
  Filled 2023-01-24: qty 1

## 2023-01-24 MED ORDER — CEFAZOLIN SODIUM-DEXTROSE 2-4 GM/100ML-% IV SOLN: 2.0000 g | INTRAVENOUS | Status: DC

## 2023-01-24 MED ORDER — IODIXANOL 320 MG/ML IV SOLN
INTRAVENOUS | Status: DC | PRN
  Administered 2023-01-24: 35 mL via INTRA_ARTERIAL

## 2023-01-24 MED ORDER — HYDROMORPHONE HCL 1 MG/ML IJ SOLN: 1.0000 mg | Freq: Once | INTRAMUSCULAR | Status: DC | PRN

## 2023-01-24 MED ORDER — LIDOCAINE-EPINEPHRINE (PF) 1 %-1:200000 IJ SOLN
INTRAMUSCULAR | Status: DC | PRN
  Administered 2023-01-24: 10 mL via INTRADERMAL

## 2023-01-24 MED ORDER — METHYLPREDNISOLONE SODIUM SUCC 125 MG IJ SOLR: 125.0000 mg | Freq: Once | INTRAMUSCULAR | Status: DC | PRN

## 2023-01-24 MED ORDER — FENTANYL CITRATE PF 50 MCG/ML IJ SOSY: 12.5000 ug | PREFILLED_SYRINGE | Freq: Once | INTRAMUSCULAR | Status: DC | PRN

## 2023-01-24 MED ORDER — HEPARIN (PORCINE) IN NACL 1000-0.9 UT/500ML-% IV SOLN
INTRAVENOUS | Status: DC | PRN
  Administered 2023-01-24: 1000 mL

## 2023-01-24 MED ORDER — CEFAZOLIN SODIUM-DEXTROSE 2-4 GM/100ML-% IV SOLN
INTRAVENOUS | Status: AC
  Filled 2023-01-24: qty 100

## 2023-01-24 MED ORDER — DIPHENHYDRAMINE HCL 50 MG/ML IJ SOLN: 50.0000 mg | Freq: Once | INTRAMUSCULAR | Status: DC | PRN

## 2023-01-24 MED ORDER — HEPARIN SODIUM (PORCINE) 1000 UNIT/ML IJ SOLN
INTRAMUSCULAR | Status: AC
  Filled 2023-01-24: qty 10

## 2023-01-24 MED ORDER — MIDAZOLAM HCL 2 MG/ML PO SYRP
8.0000 mg | ORAL_SOLUTION | Freq: Once | ORAL | Status: DC | PRN
  Filled 2023-01-24: qty 5

## 2023-01-24 MED ORDER — FENTANYL CITRATE (PF) 100 MCG/2ML IJ SOLN
INTRAMUSCULAR | Status: DC | PRN
  Administered 2023-01-24: 50 ug via INTRAVENOUS

## 2023-01-24 MED ORDER — FAMOTIDINE 20 MG PO TABS: 40.0000 mg | ORAL_TABLET | Freq: Once | ORAL | Status: DC | PRN

## 2023-01-24 MED ORDER — HEPARIN SODIUM (PORCINE) 1000 UNIT/ML IJ SOLN
INTRAMUSCULAR | Status: DC | PRN
  Administered 2023-01-24: 4000 [IU] via INTRAVENOUS

## 2023-01-24 MED ORDER — SODIUM CHLORIDE 0.9 % IV SOLN
INTRAVENOUS | Status: AC | PRN
  Administered 2023-01-24: 250 mL via INTRAVENOUS

## 2023-01-24 MED ORDER — SODIUM CHLORIDE 0.9 % IV SOLN: INTRAVENOUS | Status: DC

## 2023-01-24 MED ORDER — MIDAZOLAM HCL 2 MG/2ML IJ SOLN
INTRAMUSCULAR | Status: DC | PRN
  Administered 2023-01-24: 2 mg via INTRAVENOUS

## 2023-01-24 SURGICAL SUPPLY — 15 items
CATH ANGIO 5F PIGTAIL 65CM (CATHETERS) IMPLANT
CATH VS1 5F ANGIO SLIP (CATHETERS) IMPLANT
COVER EZ STRL 42X30 (DRAPES) IMPLANT
COVER PROBE ULTRASOUND 5X96 (MISCELLANEOUS) IMPLANT
DEVICE STARCLOSE SE CLOSURE (Vascular Products) IMPLANT
GLIDEWIRE ADV .035X180CM (WIRE) IMPLANT
KIT ENCORE 26 ADVANTAGE (KITS) IMPLANT
PACK ANGIOGRAPHY (CUSTOM PROCEDURE TRAY) ×1 IMPLANT
SHEATH ANL2 6FRX45 HC (SHEATH) IMPLANT
SHEATH BRITE TIP 5FRX11 (SHEATH) IMPLANT
STENT LIFESTREAM 5X26X80 (Permanent Stent) IMPLANT
SYR MEDRAD MARK 7 150ML (SYRINGE) IMPLANT
TUBING CONTRAST HIGH PRESS 72 (TUBING) IMPLANT
WIRE GUIDERIGHT .035X150 (WIRE) IMPLANT
WIRE SUPRACORE 190CM (WIRE) IMPLANT

## 2023-01-24 NOTE — Progress Notes (Signed)
  Progress Note    01/24/2023 11:51 AM * No surgery date entered *  Subjective:  47 y.o. 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 It would be better the patient undergo a left renal angiogram today rather than a CTA doubling her DYE load due to the severe elevation of her blood pressure.    Vitals:   01/24/23 0528 01/24/23 0742  BP: 137/72 122/62  Pulse: 70 78  Resp: 19   Temp: 98.1 F (36.7 C) 98.3 F (36.8 C)  SpO2: 93% 100%   Physical Exam: Cardiac:  RRR, Normal S1, S2, no murmurs Lungs: Clear throughout on auscultation.  No rales rhonchi or wheezing. Incisions: None Extremities: Palpable pulses throughout. Abdomen: Positive bowel sounds throughout, soft, nontender and nondistended. Neurologic: Alert and oriented x 4, follows commands and answers all questions appropriately.  CBC    Component Value Date/Time   WBC 6.0 01/24/2023 0515   RBC 4.07 01/24/2023 0515   HGB 13.8 01/24/2023 0515   HGB 15.2 (H) 11/22/2022 0913   HCT 40.5 01/24/2023 0515   PLT 213 01/24/2023 0515   PLT 208 11/22/2022 0913   MCV 99.5 01/24/2023 0515   MCH 33.9 01/24/2023 0515   MCHC 34.1 01/24/2023 0515   RDW 13.2 01/24/2023 0515   LYMPHSABS 2.3 11/22/2022 0913   MONOABS 0.8 11/22/2022 0913   EOSABS 0.1 11/22/2022 0913   BASOSABS 0.1 11/22/2022 0913    BMET    Component Value Date/Time   NA 136 01/24/2023 0515   K 4.8 01/24/2023 0515   CL 100 01/24/2023 0515   CO2 29 01/24/2023 0515   GLUCOSE 82 01/24/2023 0515   BUN 9 01/24/2023 0515   CREATININE 0.92 01/24/2023 0515    CREATININE 0.92 12/22/2022 0948   CALCIUM 9.0 01/24/2023 0515   GFRNONAA >60 01/24/2023 0515   GFRAA >60 06/05/2019 1710    INR    Component Value Date/Time   INR 1.0 01/22/2023 1827     Intake/Output Summary (Last 24 hours) at 01/24/2023 1151 Last data filed at 01/23/2023 1622 Gross per 24 hour  Intake 100 ml  Output --  Net 100 ml     Assessment/Plan:  47 y.o. female is s/p evaluation of concerns for NSTEMI and hypertensive urgency with left renal artery stenosis * No surgery date entered *   Vascular surgery plans on taking the patient to the vascular lab later today 01/24/2023 for left renal artery angiogram.  I had a long detailed discussion with the patient this morning about the procedure, benefits, risks, and complications.  Patient verbalizes her understanding and wishes to proceed today.  I answered all the patient's questions.  The patient has been n.p.o. since midnight last night.  I will place the orders accordingly to get her on the schedule later this afternoon.  DVT prophylaxis: ASA 81 mg daily, Plavix 75 mg daily, Lipitor 80 mg daily, and Lovenox 40 mg subcu every 24 hours.  I discussed the plan in detail with Dr. Festus Barren MD and he agrees with the plan.   Marcie Bal [facility] 01/24/2023 11:51 AM

## 2023-01-24 NOTE — Progress Notes (Signed)
Rounding Note    Patient Name: Joanna Garcia Date of Encounter: 01/24/2023  [facility] Cardiologist: None   Subjective   BP are improved. Plan for Cardiac CTA today. Patient denies chest pain or SOB.   Inpatient Medications    Scheduled Meds:  amLODipine  5 mg Oral Daily   aspirin  81 mg Oral Daily   atorvastatin  80 mg Oral Daily   clopidogrel  75 mg Oral Daily   enoxaparin (LOVENOX) injection  40 mg Subcutaneous Q24H   gabapentin  900 mg Oral q morning   And   gabapentin  1,200 mg Oral QPM   losartan  25 mg Oral Daily   melatonin  5 mg Oral QHS   nicotine  14 mg Transdermal Daily   pantoprazole  40 mg Oral QAC breakfast   polyethylene glycol  17 g Oral Daily   senna-docusate  2 tablet Oral BID   sodium chloride flush  3 mL Intravenous Q12H   Continuous Infusions:  promethazine (PHENERGAN) injection (IM or IVPB) 25 mg (01/23/23 1622)   PRN Meds: acetaminophen, butalbital-acetaminophen-caffeine, labetalol, nitroGLYCERIN, ondansetron **OR** ondansetron (ZOFRAN) IV, promethazine (PHENERGAN) injection (IM or IVPB), traMADol   Vital Signs    Vitals:   01/23/23 2038 01/23/23 2327 01/24/23 0528 01/24/23 0742  BP:  113/69 137/72 122/62  Pulse: 66 (!) 59 70 78  Resp:  18 19   Temp:  98.4 F (36.9 C) 98.1 F (36.7 C) 98.3 F (36.8 C)  TempSrc:  Oral Oral Oral  SpO2: 96% 96% 93% 100%  Weight:      Height:        Intake/Output Summary (Last 24 hours) at 01/24/2023 1000 Last data filed at 01/23/2023 1622 Gross per 24 hour  Intake 100 ml  Output --  Net 100 ml      01/23/2023    5:00 AM 01/22/2023   12:23 PM 12/22/2022    9:11 AM  Last 3 Weights  Weight (lbs) 120 lb 13 oz 120 lb 120 lb  Weight (kg) 54.8 kg 54.432 kg 54.432 kg      Telemetry    No new - Personally Reviewed  ECG    No new - Personally Reviewed  Physical Exam   GEN: No acute distress.   Neck: No JVD Cardiac: RRR, + murmur, rubs, or gallops.  Respiratory: Clear to auscultation  bilaterally. GI: Soft, nontender, non-distended  MS: No edema; No deformity. Neuro:  Nonfocal  Psych: Normal affect   Labs    High Sensitivity Troponin:   Recent Labs  Lab 01/22/23 1136 01/22/23 1329 01/22/23 1827 01/22/23 2057  TROPONINIHS 27* 109* 92* 49*     Chemistry Recent Labs  Lab 01/22/23 1730 01/23/23 0524 01/23/23 1434 01/24/23 0515  NA  --  132* 133* 136  K  --  2.8* 4.0 4.8  CL  --  91* 91* 100  CO2  --  30 32 29  GLUCOSE  --  105* 93 82  BUN  --  10 9 9   CREATININE  --  0.98 0.94 0.92  CALCIUM  --  8.3* 8.7* 9.0  MG 1.9  --   --   --   PROT  --  5.8*  --   --   ALBUMIN  --  3.3*  --   --   AST  --  20  --   --   ALT  --  12  --   --   ALKPHOS  --  66  --   --   BILITOT  --  1.0  --   --   GFRNONAA  --  >60 >60 >60  ANIONGAP  --  11 10 7     Lipids  Recent Labs  Lab 01/22/23 1730  CHOL 218*  TRIG 203*  HDL 81  LDLCALC 96  CHOLHDL 2.7    Hematology Recent Labs  Lab 01/22/23 1136 01/23/23 0524 01/24/23 0515  WBC 14.7* 6.7 6.0  RBC 4.68 4.06 4.07  HGB 15.9* 13.9 13.8  HCT 44.8 39.5 40.5  MCV 95.7 97.3 99.5  MCH 34.0 34.2* 33.9  MCHC 35.5 35.2 34.1  RDW 13.2 13.2 13.2  PLT 270 216 213   Thyroid  Recent Labs  Lab 01/22/23 1827  TSH 4.344    BNP Recent Labs  Lab 01/22/23 1709  BNP 17.0    DDimer No results for input(s): "DDIMER" in the last 168 hours.   Radiology    US RENAL ARTERY DUPLEX COMPLETE  Result Date: 01/23/2023 CLINICAL DATA:  Hypertension EXAM: RENAL/URINARY TRACT ULTRASOUND RENAL DUPLEX DOPPLER ULTRASOUND COMPARISON:  None available FINDINGS: Right Kidney: Length: 11.5 cm. Echogenicity within normal limits. No mass or hydronephrosis visualized. Left Kidney: Length: 11.1 cm. Echogenicity within normal limits. No mass or hydronephrosis visualized. Bladder:  Unremarkable RENAL DUPLEX ULTRASOUND Right Renal Artery Velocities: Origin:  101 cm/sec Mid:  107 cm/sec Hilum:  73 cm/sec Interlobar:  40 cm/sec Arcuate:  34  cm/sec Left Renal Artery Velocities: Origin:  236 cm/sec Mid:  106 cm/sec Hilum:  122 cm/sec Interlobar:  28 cm/sec Arcuate:  22 cm/sec Aortic Velocity:  66 cm/sec Right Renal-Aortic Ratios: Origin: 1.5 Mid:  1.6 Hilum: 1.1 Interlobar: 0.6 Arcuate: 0.5 Left Renal-Aortic Ratios: Origin: 3.7 Mid: 1.6 Hilum: 1.8 Interlobar: 0.4 Arcuate: 7.3 IMPRESSION: Mildly elevated velocity of the origin of the left main renal artery is suspicious for stenosis. Electronically Signed   By: Acquanetta Belling M.D.   On: 01/23/2023 13:04   ECHOCARDIOGRAM COMPLETE  Result Date: 01/23/2023    ECHOCARDIOGRAM REPORT   Patient Name:   Joanna Garcia Date of Exam: 01/23/2023 Medical Rec #:  [id number]       Height:       65.0 in Accession #:    [id number]      Weight:       120.8 lb Date of Birth:  [birth year]       BSA:          1.597 m Patient Age:    47 years        BP:           98/77 mmHg Patient Gender: F               HR:           72 bpm. Exam Location:  [facility] Procedure: 2D Echo and Strain Analysis Indications:     NSTEMI I21.4                  Chest Pain R07.9  History:         Patient has prior history of Echocardiogram examinations, most                  recent 02/10/2021.  Sonographer:     Overton Mam RDCS, FASE Referring Phys:  [id number] Verdene Lennert Diagnosing Phys: Debbe Odea MD  Sonographer Comments: Global longitudinal strain was attempted. IMPRESSIONS  1. Left ventricular ejection fraction, by estimation, is 55 to 60%.  The left ventricle has normal function. The left ventricle has no regional wall motion abnormalities. Left ventricular diastolic parameters were normal. The average left ventricular global longitudinal strain is -16.7 %. The global longitudinal strain is normal.  2. Right ventricular systolic function is normal. The right ventricular size is normal.  3. The mitral valve is normal in structure. Mild mitral valve regurgitation.  4. The aortic valve was not well visualized. Aortic valve regurgitation is not  visualized. Aortic valve sclerosis is present, with no evidence of aortic valve stenosis.  5. The inferior vena cava is normal in size with greater than 50% respiratory variability, suggesting right atrial pressure of 3 mmHg. FINDINGS  Left Ventricle: Left ventricular ejection fraction, by estimation, is 55 to 60%. The left ventricle has normal function. The left ventricle has no regional wall motion abnormalities. The average left ventricular global longitudinal strain is -16.7 %. The global longitudinal strain is normal. The left ventricular internal cavity size was normal in size. There is no left ventricular hypertrophy. Left ventricular diastolic parameters were normal. Right Ventricle: The right ventricular size is normal. No increase in right ventricular wall thickness. Right ventricular systolic function is normal. Left Atrium: Left atrial size was normal in size. Right Atrium: Right atrial size was normal in size. Pericardium: There is no evidence of pericardial effusion. Mitral Valve: The mitral valve is normal in structure. Mild mitral valve regurgitation. Tricuspid Valve: The tricuspid valve is normal in structure. Tricuspid valve regurgitation is not demonstrated. Aortic Valve: The aortic valve was not well visualized. Aortic valve regurgitation is not visualized. Aortic valve sclerosis is present, with no evidence of aortic valve stenosis. Aortic valve peak gradient measures 10.5 mmHg. Pulmonic Valve: The pulmonic valve was normal in structure. Pulmonic valve regurgitation is not visualized. Aorta: The aortic root is normal in size and structure. Venous: The inferior vena cava is normal in size with greater than 50% respiratory variability, suggesting right atrial pressure of 3 mmHg. IAS/Shunts: No atrial level shunt detected by color flow Doppler.  LEFT VENTRICLE PLAX 2D LVIDd:         4.20 cm   Diastology LVIDs:         2.80 cm   LV e' medial:    11.50 cm/s LV PW:         0.90 cm   LV E/e' medial:   8.3 LV IVS:        1.00 cm   LV e' lateral:   11.90 cm/s LVOT diam:     1.80 cm   LV E/e' lateral: 8.0 LV SV:         45 LV SV Index:   28        2D Longitudinal Strain LVOT Area:     2.54 cm  2D Strain GLS Avg:     -16.7 %  RIGHT VENTRICLE RV Basal diam:  1.90 cm RV S prime:     14.50 cm/s TAPSE (M-mode): 2.2 cm LEFT ATRIUM             Index        RIGHT ATRIUM          Index LA diam:        2.30 cm 1.44 cm/m   RA Area:     8.16 cm LA Vol (A2C):   28.8 ml 18.04 ml/m  RA Volume:   15.30 ml 9.58 ml/m LA Vol (A4C):   29.0 ml 18.16 ml/m LA Biplane Vol: 29.7 ml 18.60  ml/m  AORTIC VALVE                 PULMONIC VALVE AV Area (Vmax): 1.59 cm     PV Vmax:        1.01 m/s AV Vmax:        162.00 cm/s  PV Peak grad:   4.1 mmHg AV Peak Grad:   10.5 mmHg    RVOT Peak grad: 4 mmHg LVOT Vmax:      101.00 cm/s LVOT Vmean:     61.800 cm/s LVOT VTI:       0.175 m  AORTA Ao Root diam: 3.10 cm MITRAL VALVE MV Area (PHT): 3.79 cm    SHUNTS MV Decel Time: 200 msec    Systemic VTI:  0.18 m MV E velocity: 94.90 cm/s  Systemic Diam: 1.80 cm MV A velocity: 90.70 cm/s MV E/A ratio:  1.05 Debbe Odea MD Electronically signed by Debbe Odea MD Signature Date/Time: 01/23/2023/1:00:43 PM    Final    CT HEAD WO CONTRAST ( )  Result Date: 01/22/2023 CLINICAL DATA:  numbness EXAM: CT HEAD WITHOUT CONTRAST TECHNIQUE: Contiguous axial images were obtained from the base of the skull through the vertex without intravenous contrast. RADIATION DOSE REDUCTION: This exam was performed according to the departmental dose-optimization program which includes automated exposure control, adjustment of the mA and/or kV according to patient size and/or use of iterative reconstruction technique. COMPARISON:  Brain MR 02/10/2021 FINDINGS: Brain: No evidence of acute infarction, hemorrhage, hydrocephalus, extra-axial collection or mass lesion/mass effect. Chronic infarct in the right parietal lobe (series 2, image 14). Vascular: No hyperdense  vessel or unexpected calcification. Skull: Normal. Negative for fracture or focal lesion. Sinuses/Orbits: No middle ear or mastoid effusion. Paranasal sinuses are clear. Orbits are unremarkable. Other: None. IMPRESSION: No acute intracranial abnormality. Electronically Signed   By: Lorenza Cambridge M.D.   On: 01/22/2023 11:52   DG Chest 2 View  Result Date: 01/22/2023 CLINICAL DATA:  Chest pain EXAM: CHEST - 2 VIEW COMPARISON:  03/14/2019 FINDINGS: Cardiac size is within normal limits. There are no signs of pulmonary edema or focal pulmonary consolidation. Increase in AP diameter of chest suggests COPD. Emphysematous bullae are seen in the right upper lung field. IMPRESSION: COPD. There are no signs of pulmonary edema or focal pulmonary consolidation. Electronically Signed   By: Ernie Avena M.D.   On: 01/22/2023 11:50    Cardiac Studies   TTE 02/10/21 1. Left ventricular ejection fraction, by estimation, is 60 to 65%. The  left ventricle has normal function. The left ventricle has no regional  wall motion abnormalities. Left ventricular diastolic parameters were  normal.   2. Right ventricular systolic function is normal. The right ventricular  size is normal. Tricuspid regurgitation signal is inadequate for assessing  PA pressure.   3. Mild subvalvular calcification. The mitral valve is grossly normal.  Trivial mitral valve regurgitation. No evidence of mitral stenosis.   4. The aortic valve is normal in structure. Aortic valve regurgitation is  not visualized. No aortic stenosis is present.   5. The inferior vena cava is normal in size with <50% respiratory  variability, suggesting right atrial pressure of 8 mmHg.    TTE 03/15/19 1. Left ventricular ejection fraction, by visual estimation, is 60 to  65%. The left ventricle has normal function. Normal left ventricular size.  There is no left ventricular hypertrophy.   2. Global right ventricle has normal systolic function.The right   ventricular size is  normal. No increase in right ventricular wall  thickness.   3. Left atrial size was normal.   4. TR signal is inadequate for assessing pulmonary artery systolic  pressure.   5. The inferior vena cava is normal in size with greater than 50%  respiratory variability, suggesting right atrial pressure of 3 mmHg.     Patient Profile     47 y.o. 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, B12 and folate deficiency, polycythemia, tobacco use disorder, alcohol abuse, polyneuropathy, previous DVT, who is being seen 01/23/2023 for the evaluation of concerns for NSTEMI and hypertensive urgency   Assessment & Plan    HTN - h/o severe HTN that has been uncontrolled since changing from lisinopril to hydrochlorothiazide - patient reported systolics over 200s PTA - BP improved after labetolol - started on amlodipine 5mg  daily - losartan 25 mg daily - BP normal  Elevated HS trop - HS trop 27>109>92>49 - IV heparin held - suspect supply demand mismatch in the setting of uncontrolled HTN - patient is chest pain free - plan for cardiac CTA - she does have RF for CAD - continue ASA and Plavix  Hypokalemia - K4.8  H/o CVA in 2022 - no deficits - continue ASA and Plavix  Tobacco abuse - cessation recommended  For questions or updates, please contact [facility] Please consult [email or web address] for contact info under        Signed, Sven Pinheiro H Elleanor Guyett, PA-C  01/24/2023, 10:00 AM

## 2023-01-24 NOTE — H&P (View-Only) (Signed)
  Progress Note    01/24/2023 11:51 AM * No surgery date entered *  Subjective:  47 y.o. 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 It would be better the patient undergo a left renal angiogram today rather than a CTA doubling her DYE load due to the severe elevation of her blood pressure.    Vitals:   01/24/23 0528 01/24/23 0742  BP: 137/72 122/62  Pulse: 70 78  Resp: 19   Temp: 98.1 F (36.7 C) 98.3 F (36.8 C)  SpO2: 93% 100%   Physical Exam: Cardiac:  RRR, Normal S1, S2, no murmurs Lungs: Clear throughout on auscultation.  No rales rhonchi or wheezing. Incisions: None Extremities: Palpable pulses throughout. Abdomen: Positive bowel sounds throughout, soft, nontender and nondistended. Neurologic: Alert and oriented x 4, follows commands and answers all questions appropriately.  CBC    Component Value Date/Time   WBC 6.0 01/24/2023 0515   RBC 4.07 01/24/2023 0515   HGB 13.8 01/24/2023 0515   HGB 15.2 (H) 11/22/2022 0913   HCT 40.5 01/24/2023 0515   PLT 213 01/24/2023 0515   PLT 208 11/22/2022 0913   MCV 99.5 01/24/2023 0515   MCH 33.9 01/24/2023 0515   MCHC 34.1 01/24/2023 0515   RDW 13.2 01/24/2023 0515   LYMPHSABS 2.3 11/22/2022 0913   MONOABS 0.8 11/22/2022 0913   EOSABS 0.1 11/22/2022 0913   BASOSABS 0.1 11/22/2022 0913    BMET    Component Value Date/Time   NA 136 01/24/2023 0515   K 4.8 01/24/2023 0515   CL 100 01/24/2023 0515   CO2 29 01/24/2023 0515   GLUCOSE 82 01/24/2023 0515   BUN 9 01/24/2023 0515   CREATININE 0.92 01/24/2023 0515    CREATININE 0.92 12/22/2022 0948   CALCIUM 9.0 01/24/2023 0515   GFRNONAA >60 01/24/2023 0515   GFRAA >60 06/05/2019 1710    INR    Component Value Date/Time   INR 1.0 01/22/2023 1827     Intake/Output Summary (Last 24 hours) at 01/24/2023 1151 Last data filed at 01/23/2023 1622 Gross per 24 hour  Intake 100 ml  Output --  Net 100 ml     Assessment/Plan:  47 y.o. female is s/p evaluation of concerns for NSTEMI and hypertensive urgency with left renal artery stenosis * No surgery date entered *   Vascular surgery plans on taking the patient to the vascular lab later today 01/24/2023 for left renal artery angiogram.  I had a long detailed discussion with the patient this morning about the procedure, benefits, risks, and complications.  Patient verbalizes her understanding and wishes to proceed today.  I answered all the patient's questions.  The patient has been n.p.o. since midnight last night.  I will place the orders accordingly to get her on the schedule later this afternoon.  DVT prophylaxis: ASA 81 mg daily, Plavix 75 mg daily, Lipitor 80 mg daily, and Lovenox 40 mg subcu every 24 hours.  I discussed the plan in detail with Dr. Festus Barren MD and he agrees with the plan.   Marcie [facility] 01/24/2023 11:51 AM

## 2023-01-24 NOTE — Interval H&P Note (Signed)
History and Physical Interval Note:  01/24/2023 2:43 PM  Joanna Garcia  has presented today for surgery, with the diagnosis of HTN.  The various methods of treatment have been discussed with the patient and family. After consideration of risks, benefits and other options for treatment, the patient has consented to  Procedure(s): RENAL ANGIOGRAPHY (Left) as a surgical intervention.  The patient's history has been reviewed, patient examined, no change in status, stable for surgery.  I have reviewed the patient's chart and labs.  Questions were answered to the patient's satisfaction.     Festus Barren

## 2023-01-24 NOTE — Op Note (Signed)
[facility]  Percutaneous Study/Intervention Procedural Note    Surgeon(s): [employer]   Assistants: None  Pre-operative Diagnosis: Left renal artery stenosis, renovascular hypertension  Post-operative diagnosis:  Same  Procedure(s) Performed:             1.  Ultrasound guidance for vascular access right femoral artery             2.  Catheter placement into left renal artery from right femoral approach             3.  Aortogram and selective left renal angiogram             4.  Balloon expandable stent placement to the left renal artery with a 5 mm diameter x 26 mm length Lifestream stent              5.  StarClose closure device right femoral artery              Contrast: 25 cc  EBL: 5 cc   Fluoro Time: 3.8 minutes minutes  Moderate conscious sedation: Approximately 35 minutes with 2 mg of Versed and 50 mcg of Fentanyl  Indications:  The patient is a 47 year old female with an extensive vascular history with worsening severe hypertension despite medications. The patient has suboptimal blood pressure control despite multiple antihypertensives and a noninvasive study demonstrating hemodynamically significant left renal artery stenosis. Given the clinical scenario and the noninvasive findings, angiogram is indicated for further evaluation of her renal artery and potential treatment. Risks and benefits are discussed and informed consent is obtained.  Procedure:  The patient was identified and appropriate procedural time out was performed.  The patient was then placed supine on the table and prepped and draped in the usual sterile fashion. Moderate conscious sedation was administered with a face to face encounter with the patient throughout the procedure with my supervision of the RN administering medicines and monitoring the patients vital signs and mental status throughout from the start of the procedure until the patient was taken to the recovery room  Ultrasound  was used to evaluate the right common femoral artery.  It was patent .  A digital ultrasound image was acquired.  A Seldinger needle was used to access the right common femoral artery under direct ultrasound guidance and a permanent image was performed.  A 0.035 J wire was advanced without resistance and a 5Fr sheath was placed.  Pigtail catheter was placed into the aorta at the L1 level and an AP aortogram was performed. This demonstrated a patent right renal artery without significant stenosis.  There is suggestion of a greater than 60% left renal artery stenosis but on initial imaging the celiac artery/splenic artery was overlying this and made it difficult to opacify.  The aorta was patent.  The right iliac artery had a hemodynamically significant stenosis of greater than 70%.  The left iliac artery was small but did not appear to have any obvious stenosis. The patient was then systemically heparinized with 4000 units of intravenous heparin were given. I used a V S1 catheter to cannulate the left renal artery and selective imaging was performed. This confirmed a 70 to 80% stenosis of the proximal left renal artery.  At this point I selected the supra core wire and crossed the lesion without difficulty.  I then remove the diagnostic catheter and the 5 French sheath and advanced a 6 [place] Ansell sheath into the proximal left renal artery over  the supra core wire. I then selected a 5 mm diameter x 26 mm length balloon expandable Lifestream stent and brought this across the lesion.  This was deployed encompassing the lesion with its proximal extent going back into the aorta for a mm or two.  This was inflated to 12 ATM and the waist resolved.  Completion angiogram showed no significant residual stenosis in the left renal artery after stent placement. Oblique arteriogram was performed of the right femoral artery and StarClose closure device was deployed in the usual fashion with excellent hemostatic result. The  patient was taken to the recovery room in stable condition having tolerated the procedure well.  Findings:               Aortogram/Renal Arteries: This demonstrated a patent right renal artery without significant stenosis.  There is suggestion of a greater than 60% left renal artery stenosis but on initial imaging the celiac artery/splenic artery was overlying this and made it difficult to opacify.  The aorta was patent.  The right iliac artery had a hemodynamically significant stenosis of greater than 70%.  The left iliac artery was small but did not appear to have any obvious stenosis.  Selective imaging of the left renal artery confirmed a 70 to 80% proximal left renal artery stenosis.   Condition:  Stable  Complications: None   Festus Barren 01/24/2023 4:33 PM  This note was created with Dragon Medical transcription system. Any errors in dictation are purely unintentional.

## 2023-01-24 NOTE — Progress Notes (Signed)
1       [place] [facility]   PATIENT NAME: Joanna Garcia    MR#:  [id number]  PCP: Smitty Cords, DO  DATE OF BIRTH:  19-Jan-1976  SUBJECTIVE:  CHIEF COMPLAINT:   Chief Complaint  Patient presents with   Hypertension   Chest Pain  She is feeling much better and not having any headache, chest pain.  Waiting to get her test for heart and kidney REVIEW OF SYSTEMS:  Review of Systems  Constitutional:  Positive for malaise/fatigue.  All other systems reviewed and are negative.  DRUG ALLERGIES:  No Known Allergies VITALS:  Blood pressure 96/60, pulse 73, temperature 98.3 F (36.8 C), resp. rate 20, height 5\' 5"  (1.651 m), weight 54.8 kg, SpO2 95%. PHYSICAL EXAMINATION:  Physical Exam Vitals and nursing note reviewed.  Constitutional:      Appearance: She is well-developed.  HENT:     Head: Normocephalic and atraumatic.  Eyes:     Pupils: Pupils are equal, round, and reactive to light.  Cardiovascular:     Rate and Rhythm: Normal rate and regular rhythm.     Heart sounds: Normal heart sounds.  Pulmonary:     Effort: Pulmonary effort is normal.     Breath sounds: Normal breath sounds.  Abdominal:     General: Bowel sounds are normal.     Palpations: Abdomen is soft.  Musculoskeletal:        General: Normal range of motion.     Cervical back: Normal range of motion and neck supple.  Skin:    General: Skin is warm.  Neurological:     General: No focal deficit present.     Mental Status: She is alert.    LABORATORY PANEL:  Female CBC Recent Labs  Lab 01/24/23 0515  WBC 6.0  HGB 13.8  HCT 40.5  PLT 213   ------------------------------------------------------------------------------------------------------------------ Chemistries  Recent Labs  Lab 01/22/23 1730 01/23/23 0524 01/23/23 1434 01/24/23 0515  NA  --  132*   < > 136  K  --  2.8*   < > 4.8  CL  --  91*   < > 100  CO2  --  30   < > 29  GLUCOSE  --  105*   < > 82  BUN  --   10   < > 9  CREATININE  --  0.98   < > 0.92  CALCIUM  --  8.3*   < > 9.0  MG 1.9  --   --   --   AST  --  20  --   --   ALT  --  12  --   --   ALKPHOS  --  66  --   --   BILITOT  --  1.0  --   --    < > = values in this interval not displayed.   MEDICATIONS:  Scheduled Meds:  amLODipine  5 mg Oral Daily   aspirin  81 mg Oral Daily   atorvastatin  80 mg Oral Daily   clopidogrel  75 mg Oral Daily   enoxaparin (LOVENOX) injection  40 mg Subcutaneous Q24H   gabapentin  900 mg Oral q morning   And   gabapentin  1,200 mg Oral QPM   losartan  25 mg Oral Daily   melatonin  5 mg Oral QHS   nicotine  14 mg Transdermal Daily   pantoprazole  40 mg Oral QAC breakfast  polyethylene glycol  17 g Oral Daily   senna-docusate  2 tablet Oral BID   sodium chloride flush  3 mL Intravenous Q12H   Continuous Infusions:  sodium chloride      ceFAZolin (ANCEF) IV     promethazine (PHENERGAN) injection (IM or IVPB) 25 mg (01/23/23 1622)   RADIOLOGY:  No results found. ASSESSMENT AND PLAN:   47 y.o. 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   Principal Problem:   NSTEMI (non-ST elevated myocardial infarction) (HCC) Active Problems:   Severe hypertension   Electrolyte abnormality   History of CVA (cerebrovascular accident)   Polycythemia   Leukocytosis   Macrocytosis without anemia   Hypertensive urgency   Elevated troponin level   Smoking  *Elevated troponin due to supply/demand ischemia from uncontrolled hypertension.  Non-STEMI ruled out Patient has notable risk factors though, including a very strong family history with nearly all members of her family having their first coronary event in their  30s to 60s, hyperlipidemia, ischemic CVA 2 years ago, daily tobacco and alcohol use.   - coronary CTA today, if negative should be able to go home tomorrow - Continue home aspirin and Plavix - Nitro as needed for chest pain - Echocardiogram within normal limit   Severe hypertension Patient has a history of severe hypertension that has become uncontrolled since changing from lisinopril to HCTZ.  She seemed to have much better control with an ACE-I.     - Discontinue HCTZ - Renal artery duplex concerning for left renal artery stenosis-seen by vascular and recommends angiogram today - Continue Norvasc, continue losartan.  Blood pressure has normalized - Continue utilizing as needed labetalol as needed for SBP over 180 or DBP over 110   Electrolyte abnormality Likely due to HCTZ and then additional Lasix use this week.   - Pharmacy managing electrolytes   History of CVA (cerebrovascular accident) At this time, patient's neurological status is at baseline.   - Continue home aspirin, Plavix and statin   Polycythemia Hemoglobin is stable at this time.  Most likely due to tobacco use this previous polycythemia vera workup has been negative.   Leukocytosis Unclear etiology of elevated WBC at this point.  No signs or symptoms to suggest underlying infection.  Likely reactive.    Macrocytosis without anemia Improved with replacement of B12 and folate.   - Continue home regimen    Body mass index is 20.1 kg/m.  Net IO Since Admission: 603 mL [01/24/23 1428]      LOS: 0 days   Consultants: Cardiology Vascular surgery    Status is: Observation The patient remains OBS appropriate and will d/c before 2 midnights.   DVT prophylaxis:       enoxaparin (LOVENOX) injection 40 mg Start: 01/22/23 1715     Family Communication: (NO "discussed with patient")   All the records are reviewed and case discussed with Nursing and TOC team. Management plans discussed with the patient,  cardiology and they are in agreement.  CODE STATUS: Full Code Level of care: Med-Surg  TOTAL TIME TAKING CARE OF THIS PATIENT: 35 minutes.   More than 50% of the time was spent in counseling/coordination of care: YES  POSSIBLE D/C IN 1 DAYS, DEPENDING ON CLINICAL CONDITION.  Delfino Lovett M.D on 01/24/2023 at 2:28 PM  [facility]   CC: Primary care physician; Smitty Cords, DO  Note: This dictation was prepared with Dragon dictation along with smaller phrase technology. Any transcriptional errors that result from this process are unintentional.

## 2023-01-24 NOTE — Consult Note (Signed)
PHARMACY CONSULT NOTE - FOLLOW UP  Pharmacy Consult for Electrolyte Monitoring and Replacement   Recent Labs: Potassium (mmol/L)  Date Value  01/24/2023 4.8   Magnesium (mg/dL)  Date Value  74/25/9563 1.9   Calcium (mg/dL)  Date Value  87/56/4332 9.0   Albumin (g/dL)  Date Value  95/18/8416 3.3 (L)   Sodium (mmol/L)  Date Value  01/24/2023 136    Assessment: Patient admitted for NSTEMI. PMH includes hypertension, CVA, and polycythemia. Pharmacy consulted to manage electrolytes.  Goal of Therapy:  Optimize K>4, Mg > 2 All other electrolytes WNL  Plan:  No replacement needed at this time.  F/u with AM labs.   Paschal Dopp, PharmD, BCPS 01/24/2023 7:54 AM

## 2023-01-24 NOTE — Progress Notes (Signed)
Patient clinically stable post Renal Angiogram, vitals stable post procedure. Awake/alert and oriented post procedure. No bleeding nor hematoma at right groin site. Denies complaints at this time. Report given to [facility] RN post procedure/241.

## 2023-01-25 ENCOUNTER — Encounter: Payer: Self-pay | Admitting: Oncology

## 2023-01-25 ENCOUNTER — Telehealth: Payer: Self-pay

## 2023-01-25 ENCOUNTER — Encounter: Payer: Self-pay | Admitting: Vascular Surgery

## 2023-01-25 ENCOUNTER — Ambulatory Visit: Payer: 59 | Admitting: Physical Therapy

## 2023-01-25 DIAGNOSIS — Z79899 Other long term (current) drug therapy: Secondary | ICD-10-CM | POA: Diagnosis not present

## 2023-01-25 DIAGNOSIS — Z8249 Family history of ischemic heart disease and other diseases of the circulatory system: Secondary | ICD-10-CM | POA: Diagnosis not present

## 2023-01-25 DIAGNOSIS — Z806 Family history of leukemia: Secondary | ICD-10-CM | POA: Diagnosis not present

## 2023-01-25 DIAGNOSIS — Z7902 Long term (current) use of antithrombotics/antiplatelets: Secondary | ICD-10-CM | POA: Diagnosis not present

## 2023-01-25 DIAGNOSIS — D72829 Elevated white blood cell count, unspecified: Secondary | ICD-10-CM | POA: Diagnosis present

## 2023-01-25 DIAGNOSIS — Z825 Family history of asthma and other chronic lower respiratory diseases: Secondary | ICD-10-CM | POA: Diagnosis not present

## 2023-01-25 DIAGNOSIS — I701 Atherosclerosis of renal artery: Secondary | ICD-10-CM | POA: Diagnosis present

## 2023-01-25 DIAGNOSIS — I161 Hypertensive emergency: Secondary | ICD-10-CM | POA: Diagnosis not present

## 2023-01-25 DIAGNOSIS — R0789 Other chest pain: Secondary | ICD-10-CM | POA: Diagnosis not present

## 2023-01-25 DIAGNOSIS — Z8673 Personal history of transient ischemic attack (TIA), and cerebral infarction without residual deficits: Secondary | ICD-10-CM | POA: Diagnosis not present

## 2023-01-25 DIAGNOSIS — Z803 Family history of malignant neoplasm of breast: Secondary | ICD-10-CM | POA: Diagnosis not present

## 2023-01-25 DIAGNOSIS — D7589 Other specified diseases of blood and blood-forming organs: Secondary | ICD-10-CM | POA: Diagnosis present

## 2023-01-25 DIAGNOSIS — K219 Gastro-esophageal reflux disease without esophagitis: Secondary | ICD-10-CM | POA: Diagnosis present

## 2023-01-25 DIAGNOSIS — Z86718 Personal history of other venous thrombosis and embolism: Secondary | ICD-10-CM | POA: Diagnosis not present

## 2023-01-25 DIAGNOSIS — T502X5A Adverse effect of carbonic-anhydrase inhibitors, benzothiadiazides and other diuretics, initial encounter: Secondary | ICD-10-CM | POA: Diagnosis present

## 2023-01-25 DIAGNOSIS — D751 Secondary polycythemia: Secondary | ICD-10-CM | POA: Diagnosis present

## 2023-01-25 DIAGNOSIS — E876 Hypokalemia: Secondary | ICD-10-CM | POA: Diagnosis present

## 2023-01-25 DIAGNOSIS — E785 Hyperlipidemia, unspecified: Secondary | ICD-10-CM | POA: Diagnosis present

## 2023-01-25 DIAGNOSIS — F17210 Nicotine dependence, cigarettes, uncomplicated: Secondary | ICD-10-CM | POA: Diagnosis present

## 2023-01-25 DIAGNOSIS — Z809 Family history of malignant neoplasm, unspecified: Secondary | ICD-10-CM | POA: Diagnosis not present

## 2023-01-25 DIAGNOSIS — I2489 Other forms of acute ischemic heart disease: Secondary | ICD-10-CM | POA: Diagnosis not present

## 2023-01-25 DIAGNOSIS — R7989 Other specified abnormal findings of blood chemistry: Secondary | ICD-10-CM

## 2023-01-25 DIAGNOSIS — Z83438 Family history of other disorder of lipoprotein metabolism and other lipidemia: Secondary | ICD-10-CM | POA: Diagnosis not present

## 2023-01-25 DIAGNOSIS — I150 Renovascular hypertension: Secondary | ICD-10-CM | POA: Diagnosis present

## 2023-01-25 DIAGNOSIS — Z832 Family history of diseases of the blood and blood-forming organs and certain disorders involving the immune mechanism: Secondary | ICD-10-CM | POA: Diagnosis not present

## 2023-01-25 DIAGNOSIS — G629 Polyneuropathy, unspecified: Secondary | ICD-10-CM | POA: Diagnosis present

## 2023-01-25 MED ORDER — METOPROLOL TARTRATE 100 MG PO TABS: 100.0000 mg | ORAL_TABLET | Freq: Once | ORAL | 0 refills | Status: DC

## 2023-01-25 MED ORDER — LOSARTAN POTASSIUM 25 MG PO TABS: 25.0000 mg | ORAL_TABLET | Freq: Every day | ORAL | 0 refills | Status: DC

## 2023-01-25 MED ORDER — SODIUM CHLORIDE 0.9 % IV SOLN: INTRAVENOUS | Status: DC

## 2023-01-25 MED ORDER — AMLODIPINE BESYLATE 5 MG PO TABS: 5.0000 mg | ORAL_TABLET | Freq: Every day | ORAL | 0 refills | Status: DC

## 2023-01-25 MED ORDER — NICOTINE 14 MG/24HR TD PT24: 14.0000 mg | MEDICATED_PATCH | Freq: Every day | TRANSDERMAL | 0 refills | Status: DC

## 2023-01-25 NOTE — Plan of Care (Signed)

## 2023-01-25 NOTE — Progress Notes (Signed)
Rounding Note    Patient Name: Joanna Garcia Date of Encounter: 01/25/2023  [facility] Cardiologist: New  Subjective   Patient is s/p stent to the left renal artery. She was unable to undergo Cardiac CTA. Patient had soft blood pressures overnight. Plan for D/C today.  Inpatient Medications    Scheduled Meds:  amLODipine  5 mg Oral Daily   aspirin  81 mg Oral Daily   atorvastatin  80 mg Oral Daily   clopidogrel  75 mg Oral Daily   enoxaparin (LOVENOX) injection  40 mg Subcutaneous Q24H   gabapentin  900 mg Oral q morning   And   gabapentin  1,200 mg Oral QPM   losartan  25 mg Oral Daily   melatonin  5 mg Oral QHS   nicotine  14 mg Transdermal Daily   pantoprazole  40 mg Oral QAC breakfast   polyethylene glycol  17 g Oral Daily   senna-docusate  2 tablet Oral BID   sodium chloride flush  3 mL Intravenous Q12H   Continuous Infusions:  sodium chloride     promethazine (PHENERGAN) injection (IM or IVPB) Stopped (01/23/23 1637)   PRN Meds: acetaminophen, butalbital-acetaminophen-caffeine, labetalol, nitroGLYCERIN, ondansetron **OR** [DISCONTINUED] ondansetron (ZOFRAN) IV, promethazine (PHENERGAN) injection (IM or IVPB), traMADol   Vital Signs    Vitals:   01/25/23 0402 01/25/23 0405 01/25/23 0412 01/25/23 0752  BP:  (!) 74/60 (!) 91/56 114/80  Pulse:  61 84 79  Resp:  18  16  Temp:  98.2 F (36.8 C)  (!) 97.5 F (36.4 C)  TempSrc:    Oral  SpO2:  97%  98%  Weight: 54.2 kg     Height:        Intake/Output Summary (Last 24 hours) at 01/25/2023 0926 Last data filed at 01/25/2023 0404 Gross per 24 hour  Intake 2265 ml  Output --  Net 2265 ml      01/25/2023    4:02 AM 01/24/2023    2:38 PM 01/23/2023    5:00 AM  Last 3 Weights  Weight (lbs) 119 lb 7.8 oz 121 lb 4.1 oz 120 lb 13 oz  Weight (kg) 54.2 kg 55 kg 54.8 kg      Telemetry    N/A - Personally Reviewed  ECG    No new - Personally Reviewed  Physical Exam   GEN: No acute distress.    Neck: No JVD Cardiac: RRR, no murmurs, rubs, or gallops.  Respiratory: Clear to auscultation bilaterally. GI: Soft, nontender, non-distended  MS: No edema; No deformity. Neuro:  Nonfocal  Psych: Normal affect   Labs    High Sensitivity Troponin:   Recent Labs  Lab 01/22/23 1136 01/22/23 1329 01/22/23 1827 01/22/23 2057  TROPONINIHS 27* 109* 92* 49*     Chemistry Recent Labs  Lab 01/22/23 1730 01/23/23 0524 01/23/23 1434 01/24/23 0515 01/25/23 0512  NA  --  132* 133* 136 134*  K  --  2.8* 4.0 4.8 4.2  CL  --  91* 91* 100 101  CO2  --  30 32 29 27  GLUCOSE  --  105* 93 82 88  BUN  --  10 9 9 10   CREATININE  --  0.98 0.94 0.92 0.81  CALCIUM  --  8.3* 8.7* 9.0 8.3*  MG 1.9  --   --   --   --   PROT  --  5.8*  --   --   --   ALBUMIN  --  3.3*  --   --   --   AST  --  20  --   --   --   ALT  --  12  --   --   --   ALKPHOS  --  66  --   --   --   BILITOT  --  1.0  --   --   --   GFRNONAA  --  >60 >60 >60 >60  ANIONGAP  --  11 10 7 6     Lipids  Recent Labs  Lab 01/22/23 1730  CHOL 218*  TRIG 203*  HDL 81  LDLCALC 96  CHOLHDL 2.7    Hematology Recent Labs  Lab 01/23/23 0524 01/24/23 0515 01/25/23 0512  WBC 6.7 6.0 9.4  RBC 4.06 4.07 3.58*  HGB 13.9 13.8 12.2  HCT 39.5 40.5 36.8  MCV 97.3 99.5 102.8*  MCH 34.2* 33.9 34.1*  MCHC 35.2 34.1 33.2  RDW 13.2 13.2 13.3  PLT 216 213 200   Thyroid  Recent Labs  Lab 01/22/23 1827  TSH 4.344    BNP Recent Labs  Lab 01/22/23 1709  BNP 17.0    DDimer No results for input(s): "DDIMER" in the last 168 hours.   Radiology    PERIPHERAL VASCULAR CATHETERIZATION  Result Date: 01/24/2023 See surgical note for result.  US RENAL ARTERY DUPLEX COMPLETE  Result Date: 01/23/2023 CLINICAL DATA:  Hypertension EXAM: RENAL/URINARY TRACT ULTRASOUND RENAL DUPLEX DOPPLER ULTRASOUND COMPARISON:  None available FINDINGS: Right Kidney: Length: 11.5 cm. Echogenicity within normal limits. No mass or hydronephrosis  visualized. Left Kidney: Length: 11.1 cm. Echogenicity within normal limits. No mass or hydronephrosis visualized. Bladder:  Unremarkable RENAL DUPLEX ULTRASOUND Right Renal Artery Velocities: Origin:  101 cm/sec Mid:  107 cm/sec Hilum:  73 cm/sec Interlobar:  40 cm/sec Arcuate:  34 cm/sec Left Renal Artery Velocities: Origin:  236 cm/sec Mid:  106 cm/sec Hilum:  122 cm/sec Interlobar:  28 cm/sec Arcuate:  22 cm/sec Aortic Velocity:  66 cm/sec Right Renal-Aortic Ratios: Origin: 1.5 Mid:  1.6 Hilum: 1.1 Interlobar: 0.6 Arcuate: 0.5 Left Renal-Aortic Ratios: Origin: 3.7 Mid: 1.6 Hilum: 1.8 Interlobar: 0.4 Arcuate: 7.3 IMPRESSION: Mildly elevated velocity of the origin of the left main renal artery is suspicious for stenosis. Electronically Signed   By: Acquanetta Belling M.D.   On: 01/23/2023 13:04   ECHOCARDIOGRAM COMPLETE  Result Date: 01/23/2023    ECHOCARDIOGRAM REPORT   Patient Name:   Joanna Garcia Date of Exam: 01/23/2023 Medical Rec #:  [id number]       Height:       65.0 in Accession #:    [id number]      Weight:       120.8 lb Date of Birth:  [birth year]       BSA:          1.597 m Patient Age:    47 years        BP:           98/77 mmHg Patient Gender: F               HR:           72 bpm. Exam Location:  [facility] Procedure: 2D Echo and Strain Analysis Indications:     NSTEMI I21.4                  Chest Pain R07.9  History:         Patient has prior  history of Echocardiogram examinations, most                  recent 02/10/2021.  Sonographer:     Overton Mam RDCS, FASE Referring Phys:  [id number] Verdene Lennert Diagnosing Phys: Debbe Odea MD  Sonographer Comments: Global longitudinal strain was attempted. IMPRESSIONS  1. Left ventricular ejection fraction, by estimation, is 55 to 60%. The left ventricle has normal function. The left ventricle has no regional wall motion abnormalities. Left ventricular diastolic parameters were normal. The average left ventricular global longitudinal strain is -16.7 %. The  global longitudinal strain is normal.  2. Right ventricular systolic function is normal. The right ventricular size is normal.  3. The mitral valve is normal in structure. Mild mitral valve regurgitation.  4. The aortic valve was not well visualized. Aortic valve regurgitation is not visualized. Aortic valve sclerosis is present, with no evidence of aortic valve stenosis.  5. The inferior vena cava is normal in size with greater than 50% respiratory variability, suggesting right atrial pressure of 3 mmHg. FINDINGS  Left Ventricle: Left ventricular ejection fraction, by estimation, is 55 to 60%. The left ventricle has normal function. The left ventricle has no regional wall motion abnormalities. The average left ventricular global longitudinal strain is -16.7 %. The global longitudinal strain is normal. The left ventricular internal cavity size was normal in size. There is no left ventricular hypertrophy. Left ventricular diastolic parameters were normal. Right Ventricle: The right ventricular size is normal. No increase in right ventricular wall thickness. Right ventricular systolic function is normal. Left Atrium: Left atrial size was normal in size. Right Atrium: Right atrial size was normal in size. Pericardium: There is no evidence of pericardial effusion. Mitral Valve: The mitral valve is normal in structure. Mild mitral valve regurgitation. Tricuspid Valve: The tricuspid valve is normal in structure. Tricuspid valve regurgitation is not demonstrated. Aortic Valve: The aortic valve was not well visualized. Aortic valve regurgitation is not visualized. Aortic valve sclerosis is present, with no evidence of aortic valve stenosis. Aortic valve peak gradient measures 10.5 mmHg. Pulmonic Valve: The pulmonic valve was normal in structure. Pulmonic valve regurgitation is not visualized. Aorta: The aortic root is normal in size and structure. Venous: The inferior vena cava is normal in size with greater than 50%  respiratory variability, suggesting right atrial pressure of 3 mmHg. IAS/Shunts: No atrial level shunt detected by color flow Doppler.  LEFT VENTRICLE PLAX 2D LVIDd:         4.20 cm   Diastology LVIDs:         2.80 cm   LV e' medial:    11.50 cm/s LV PW:         0.90 cm   LV E/e' medial:  8.3 LV IVS:        1.00 cm   LV e' lateral:   11.90 cm/s LVOT diam:     1.80 cm   LV E/e' lateral: 8.0 LV SV:         45 LV SV Index:   28        2D Longitudinal Strain LVOT Area:     2.54 cm  2D Strain GLS Avg:     -16.7 %  RIGHT VENTRICLE RV Basal diam:  1.90 cm RV S prime:     14.50 cm/s TAPSE (M-mode): 2.2 cm LEFT ATRIUM             Index        RIGHT ATRIUM  Index LA diam:        2.30 cm 1.44 cm/m   RA Area:     8.16 cm LA Vol (A2C):   28.8 ml 18.04 ml/m  RA Volume:   15.30 ml 9.58 ml/m LA Vol (A4C):   29.0 ml 18.16 ml/m LA Biplane Vol: 29.7 ml 18.60 ml/m  AORTIC VALVE                 PULMONIC VALVE AV Area (Vmax): 1.59 cm     PV Vmax:        1.01 m/s AV Vmax:        162.00 cm/s  PV Peak grad:   4.1 mmHg AV Peak Grad:   10.5 mmHg    RVOT Peak grad: 4 mmHg LVOT Vmax:      101.00 cm/s LVOT Vmean:     61.800 cm/s LVOT VTI:       0.175 m  AORTA Ao Root diam: 3.10 cm MITRAL VALVE MV Area (PHT): 3.79 cm    SHUNTS MV Decel Time: 200 msec    Systemic VTI:  0.18 m MV E velocity: 94.90 cm/s  Systemic Diam: 1.80 cm MV A velocity: 90.70 cm/s MV E/A ratio:  1.05 Debbe Odea MD Electronically signed by Debbe Odea MD Signature Date/Time: 01/23/2023/1:00:43 PM    Final     Cardiac Studies   TTE 02/10/21 1. Left ventricular ejection fraction, by estimation, is 60 to 65%. The  left ventricle has normal function. The left ventricle has no regional  wall motion abnormalities. Left ventricular diastolic parameters were  normal.   2. Right ventricular systolic function is normal. The right ventricular  size is normal. Tricuspid regurgitation signal is inadequate for assessing  PA pressure.   3. Mild  subvalvular calcification. The mitral valve is grossly normal.  Trivial mitral valve regurgitation. No evidence of mitral stenosis.   4. The aortic valve is normal in structure. Aortic valve regurgitation is  not visualized. No aortic stenosis is present.   5. The inferior vena cava is normal in size with <50% respiratory  variability, suggesting right atrial pressure of 8 mmHg.    TTE 03/15/19 1. Left ventricular ejection fraction, by visual estimation, is 60 to  65%. The left ventricle has normal function. Normal left ventricular size.  There is no left ventricular hypertrophy.   2. Global right ventricle has normal systolic function.The right  ventricular size is normal. No increase in right ventricular wall  thickness.   3. Left atrial size was normal.   4. TR signal is inadequate for assessing pulmonary artery systolic  pressure.   5. The inferior vena cava is normal in size with greater than 50%  respiratory variability, suggesting right atrial pressure of 3 mmHg.   Patient Profile     47 y.o. female  with a h/o uncontrolled hypertension that is chronic, acute CVA secondary to LVEF with right ICA/CCA thrombus status post tPA and thrombectomy, B12 and folate deficiency, polycythemia, tobacco use disorder, alcohol abuse, polyneuropathy, previous DVT, who is being seen 01/23/2023 for the evaluation of concerns for NSTEMI and hypertensive urgency   Assessment & Plan    HTN - h/o severe HTN that has been uncontrolled since changing from lisinopril to hydrochlorothiazide - patient reported systolics over 200s PTA - BP improved after labetolol - started on amlodipine 5mg  daily and losartan 25mg  daily - soft Bps overnight, we will hold amlodipine   Elevated HS trop - HS trop 27>109>92>49 - suspect supply  demand mismatch in the setting of uncontrolled HTN - patient is chest pain free - she does have RF for CAD - continue ASA and Plavix - unable to perform Cardiac CTA given renal  stent. We will schedule this as an outpatient.   Hypokalemia - K4.8   H/o CVA in 2022 - no deficits - continue ASA and Plavix   Tobacco abuse - cessation recommended  Left renal artery stenosis s/p stent - vascular surgery is following  For questions or updates, please contact [facility] Please consult [email or web address] for contact info under        Signed, Priscella Donna David Stall, PA-C  01/25/2023, 9:26 AM

## 2023-01-25 NOTE — Progress Notes (Signed)
  Progress Note    01/25/2023 9:54 AM 1 Day Post-Op  Subjective:  Joanna Garcia is a 47 yo female who is now POD #1 Aortogram with selective left renal angiogram. Patient rests comfortably in bed this morning. Noted to have some erythema to her right groin without any hematoma. No right lower extremity difficulties. Patient denies any chest pain, SOB, or left flank pain.    Vitals:   01/25/23 0412 01/25/23 0752  BP: (!) 91/56 114/80  Pulse: 84 79  Resp:  16  Temp:  (!) 97.5 F (36.4 C)  SpO2:  98%   Physical Exam: Cardiac:  RRR, Normal S1, S2. No murmurs noted.  Lungs:  Clear on Auscultation throughout. No rales, rhonchi or wheezing. Incisions:  Right groin with erythema without hematoma and seroma.  Extremities:  Bilateral lower extremity with palpable pulses.  Abdomen:  Positive bowel sounds throughout, soft, non tender and non distended.  Neurologic: AAOX3, Follows commands and answers all questions appropriately.   CBC    Component Value Date/Time   WBC 9.4 01/25/2023 0512   RBC 3.58 (L) 01/25/2023 0512   HGB 12.2 01/25/2023 0512   HGB 15.2 (H) 11/22/2022 0913   HCT 36.8 01/25/2023 0512   PLT 200 01/25/2023 0512   PLT 208 11/22/2022 0913   MCV 102.8 (H) 01/25/2023 0512   MCH 34.1 (H) 01/25/2023 0512   MCHC 33.2 01/25/2023 0512   RDW 13.3 01/25/2023 0512   LYMPHSABS 2.3 11/22/2022 0913   MONOABS 0.8 11/22/2022 0913   EOSABS 0.1 11/22/2022 0913   BASOSABS 0.1 11/22/2022 0913    BMET    Component Value Date/Time   NA 134 (L) 01/25/2023 0512   K 4.2 01/25/2023 0512   CL 101 01/25/2023 0512   CO2 27 01/25/2023 0512   GLUCOSE 88 01/25/2023 0512   BUN 10 01/25/2023 0512   CREATININE 0.81 01/25/2023 0512   CREATININE 0.92 12/22/2022 0948   CALCIUM 8.3 (L) 01/25/2023 0512   GFRNONAA >60 01/25/2023 0512   GFRAA >60 06/05/2019 1710    INR    Component Value Date/Time   INR 1.0 01/22/2023 1827     Intake/Output Summary (Last 24 hours) at 01/25/2023  0954 Last data filed at 01/25/2023 0404 Gross per 24 hour  Intake 2265 ml  Output --  Net 2265 ml     Assessment/Plan:  47 y.o. female is s/p Aortogram with selective left renal angiogram.  1 Day Post-Op   PLAN: Okay per vascular surgery for patient to be discharged home today.  Patient needs to be discharged on Dual anti-platelet therapy with a Statin.  Follow up With Vascular Surgery as Scheduled.   Patient Discharged home on ASA 81 mg Daily, Plavix 75 mg Daily and Lipitor 80 mg Daily.   DVT prophylaxis:  ASA 81 mg Daily, Plavix 75 mg Daily,     Joanna Garcia [facility] 01/25/2023 9:54 AM

## 2023-01-25 NOTE — Consult Note (Signed)
PHARMACY CONSULT NOTE - FOLLOW UP  Pharmacy Consult for Electrolyte Monitoring and Replacement   Recent Labs: Potassium (mmol/L)  Date Value  01/25/2023 4.2   Magnesium (mg/dL)  Date Value  16/03/9603 1.9   Calcium (mg/dL)  Date Value  54/02/8118 8.3 (L)   Albumin (g/dL)  Date Value  14/78/2956 3.3 (L)   Sodium (mmol/L)  Date Value  01/25/2023 134 (L)    Assessment: Patient admitted for NSTEMI. PMH includes hypertension, CVA, and polycythemia. Pharmacy consulted to manage electrolytes.  Goal of Therapy:  Optimize K>4, Mg > 2 All other electrolytes WNL  Plan:  No replacement needed at this time.  F/u with AM labs.   Paschal Dopp, PharmD, BCPS 01/25/2023 9:03 AM

## 2023-01-25 NOTE — Telephone Encounter (Signed)
Pt made aware of Cardiac CTA instructions and also uploaded to mychart for review. Pt informed someone from their office will call to schedule date and time. Pt verbalized understanding    Your cardiac CT will be scheduled at one of the below locations:    [facility] [address] [place], [place] [address] [phone]  OR   [facility] [address] [place], [place] [address] [phone]  If scheduled at [facility], please arrive at the [facility] and Children's Entrance (Entrance C2) of [facility] 30 minutes prior to test start time. You can use the FREE valet parking offered at entrance C (encouraged to control the heart rate for the test)  Proceed to the [facility] Radiology Department (first floor) to check-in and test prep.  All radiology patients and guests should use entrance C2 at [facility], accessed from [facility], even though the hospital's physical address listed is [address].    If scheduled at [facility] or [facility], please arrive 15 mins early for check-in and test prep.  There is spacious parking and easy access to the radiology department from the [facility] entrance. Please enter here and check-in with the desk attendant.   Please follow these instructions carefully (unless otherwise directed):  An IV will be required for this test and Nitroglycerin will be given.  Hold all erectile dysfunction medications at least 3 days (72 hrs) prior to test. (Ie viagra, cialis, sildenafil, tadalafil, etc)   On the Night Before the Test: Be sure to Drink plenty of water. Do not consume any caffeinated/decaffeinated beverages or chocolate 12 hours prior to your test. Do not take any antihistamines 12 hours prior to your test.   On the Day of the Test: Drink plenty of water until 1  hour prior to the test. Do not eat any food 1 hour prior to test. You may take your regular medications prior to the test.  Take metoprolol (Lopressor) 100 mg  two hours prior to test. If you take Furosemide/Hydrochlorothiazide/Spironolactone, please HOLD on the morning of the test. FEMALES- please wear underwire-free bra if available, avoid dresses & tight clothing       After the Test: Drink plenty of water. After receiving IV contrast, you may experience a mild flushed feeling. This is normal. On occasion, you may experience a mild rash up to 24 hours after the test. This is not dangerous. If this occurs, you can take Benadryl 25 mg and increase your fluid intake. If you experience trouble breathing, this can be serious. If it is severe call 911 IMMEDIATELY. If it is mild, please call our office. If you take any of these medications: Glipizide/Metformin, Avandament, Glucavance, please do not take 48 hours after completing test unless otherwise instructed.  We will call to schedule your test 2-4 weeks out understanding that some insurance companies will need an authorization prior to the service being performed.   For more information and frequently asked questions, please visit our website : [email or web address]  For non-scheduling related questions, please contact the cardiac imaging nurse navigator should you have any questions/concerns: Cardiac Imaging Nurse Navigators Direct Office Dial: [phone]   For scheduling needs, including cancellations and rescheduling, please call [place], [phone].

## 2023-01-26 NOTE — Discharge Summary (Signed)
Physician Discharge Summary   Patient: Joanna Garcia MRN: [id number] DOB: 17-Apr-1976  Admit date:     01/22/2023  Discharge date: 01/25/2023  Discharge Physician: Delfino Lovett   PCP: Smitty Cords, DO   Recommendations at discharge:   Follow-up with outpatient providers as requested  Discharge Diagnoses: Principal Problem:   NSTEMI (non-ST elevated myocardial infarction) (HCC) Active Problems:   Severe hypertension   Electrolyte abnormality   History of CVA (cerebrovascular accident)   Polycythemia   Leukocytosis   Macrocytosis without anemia   Hypertensive urgency   Elevated troponin level   Smoking   Renal artery stenosis (HCC)   Hypertensive emergency   Atypical chest pain   Demand ischemia  Hospital Course: Assessment and Plan:  47 y.o. 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    Principal Problem: Uncontrolled hypertension Active Problems:   Severe hypertension   Electrolyte abnormality   History of CVA (cerebrovascular accident)   Polycythemia   Leukocytosis   Macrocytosis without anemia   Hypertensive urgency   Elevated troponin level   Smoking   *Elevated troponin due to supply/demand ischemia from uncontrolled/renovascular hypertension.  Non-STEMI ruled out Patient has notable risk factors though, including a very strong family history with nearly all members of her family having their first coronary event in their 30s to 78s, hyperlipidemia, ischemic CVA 2 years ago, daily tobacco and alcohol use.   -Outpatient cardiac workup - Continue home aspirin and Plavix - Echocardiogram within normal limit   Severe/renovascular hypertension Patient has a history  of severe hypertension that has become uncontrolled since changing from lisinopril to HCTZ.  She seemed to have much better control with an ACE-I.     - Discontinue HCTZ - Renal artery duplex concerning for left renal artery stenosis-status post aortogram/left renal angiogram and stenting - Continue Norvasc, continue losartan.  Blood pressure has normalized   Electrolyte abnormality Likely due to HCTZ and then additional Lasix use this week. -Electrolytes replaced   History of CVA (cerebrovascular accident) At this time, patient's neurological status is at baseline.  - Continue home aspirin, Plavix and statin   Polycythemia Hemoglobin is stable at this time.  Most likely due to tobacco use this previous polycythemia vera workup has been negative.   Leukocytosis Unclear etiology of elevated WBC at this point.  No signs or symptoms to suggest underlying infection.  Likely reactive.    Macrocytosis without anemia Improved with replacement of B12 and folate.  - Continue home regimen       Consultants: Cardiology, vascular surgery Procedures performed:  aortogram/left renal angiogram and renal artery stenting  Disposition: Home Diet recommendation:  Discharge Diet Orders (From admission, onward)     Start     Ordered   01/25/23 0000  Diet - low sodium heart healthy        01/25/23 0850           Carb modified diet DISCHARGE MEDICATION: Allergies as of 01/25/2023   No Known Allergies      Medication List     STOP taking these medications    diclofenac Sodium 1 % Gel Commonly known as: VOLTAREN   DULoxetine 30 MG capsule Commonly known as: Cymbalta   folic acid 1 MG tablet  Commonly known as: FOLVITE   hydrochlorothiazide 25 MG tablet Commonly known as: HYDRODIURIL   lidocaine 5 % Commonly known as: Lidoderm       TAKE these medications    aspirin 81 MG chewable tablet Chew and swallow 1 tablet (81 mg total) by mouth daily.   atorvastatin 80 MG  tablet Commonly known as: LIPITOR Take 1 tablet (80 mg total) by mouth daily.   clopidogrel 75 MG tablet Commonly known as: Plavix Take 1 tablet (75 mg total) by mouth daily.   gabapentin 300 MG capsule Commonly known as: NEURONTIN Take 3 caps = 900mg  dose in morning and afternoon and take 4 caps = 1200mg  in evening. Max dose is 10 capsules in 24 hours.   losartan 25 MG tablet Commonly known as: COZAAR Take 1 tablet (25 mg total) by mouth daily.   meloxicam 15 MG tablet Commonly known as: MOBIC Take 1 tablet (15 mg total) by mouth daily as needed for pain (back pain). What changed: when to take this   nicotine 14 mg/24hr patch Commonly known as: NICODERM CQ - dosed in mg/24 hours Place 1 patch (14 mg total) onto the skin daily.   ondansetron 4 MG disintegrating tablet Commonly known as: ZOFRAN-ODT Take 1 tablet (4 mg total) by mouth every 8 (eight) hours as needed for nausea or vomiting.   pantoprazole 40 MG tablet Commonly known as: PROTONIX Take 1 tablet (40 mg total) by mouth daily before breakfast.   tiZANidine 4 MG tablet Commonly known as: Zanaflex Take 1 tablet (4 mg total) by mouth every 8 (eight) hours as needed for muscle spasms.        Follow-up Information     End, Cristal Deer, MD. Go in 2 week(s).   Specialty: Cardiology Why: Appointment on Friday, 8/30/224 at 2:20pm with Eula Listen. Contact information: [address] [place] [address] [phone]                Discharge Exam: Ceasar Mons Weights   01/23/23 0500 01/24/23 1438 01/25/23 0402  Weight: 54.8 kg 55 kg 35.14 kg   47 year old female lying in the bed comfortably without any acute distress Lungs clear to auscultation bilaterally Heart regular rate and rhythm Abdomen soft, benign Neuro alert and awake, nonfocal Skin no rash or lesion Psych normal mood and affect  Condition at discharge: good  The results of significant diagnostics from this hospitalization  (including imaging, microbiology, ancillary and laboratory) are listed below for reference.   Imaging Studies: PERIPHERAL VASCULAR CATHETERIZATION  Result Date: 01/24/2023 See surgical note for result.  US RENAL ARTERY DUPLEX COMPLETE  Result Date: 01/23/2023 CLINICAL DATA:  Hypertension EXAM: RENAL/URINARY TRACT ULTRASOUND RENAL DUPLEX DOPPLER ULTRASOUND COMPARISON:  None available FINDINGS: Right Kidney: Length: 11.5 cm. Echogenicity within normal limits. No mass or hydronephrosis visualized. Left Kidney: Length: 11.1 cm. Echogenicity within normal limits. No mass or hydronephrosis visualized. Bladder:  Unremarkable RENAL DUPLEX ULTRASOUND Right Renal Artery Velocities: Origin:  101 cm/sec Mid:  107 cm/sec Hilum:  73 cm/sec Interlobar:  40 cm/sec Arcuate:  34 cm/sec Left Renal Artery Velocities: Origin:  236 cm/sec Mid:  106 cm/sec Hilum:  122 cm/sec Interlobar:  28 cm/sec Arcuate:  22 cm/sec Aortic Velocity:  66 cm/sec Right Renal-Aortic Ratios: Origin: 1.5 Mid:  1.6 Hilum: 1.1 Interlobar: 0.6 Arcuate: 0.5 Left Renal-Aortic Ratios: Origin: 3.7 Mid: 1.6 Hilum: 1.8 Interlobar: 0.4 Arcuate: 7.3 IMPRESSION: Mildly elevated velocity of the origin of the left main renal artery is suspicious for  stenosis. Electronically Signed   By: Acquanetta Belling M.D.   On: 01/23/2023 13:04   ECHOCARDIOGRAM COMPLETE  Result Date: 01/23/2023    ECHOCARDIOGRAM REPORT   Patient Name:   TENEE MARCHANT Nippert Date of Exam: 01/23/2023 Medical Rec #:  [id number]       Height:       65.0 in Accession #:    [id number]      Weight:       120.8 lb Date of Birth:  [birth year]       BSA:          1.597 m Patient Age:    47 years        BP:           98/77 mmHg Patient Gender: F               HR:           72 bpm. Exam Location:  [facility] Procedure: 2D Echo and Strain Analysis Indications:     NSTEMI I21.4                  Chest Pain R07.9  History:         Patient has prior history of Echocardiogram examinations, most                  recent 02/10/2021.   Sonographer:     Overton Mam RDCS, FASE Referring Phys:  [id number] Verdene Lennert Diagnosing Phys: Debbe Odea MD  Sonographer Comments: Global longitudinal strain was attempted. IMPRESSIONS  1. Left ventricular ejection fraction, by estimation, is 55 to 60%. The left ventricle has normal function. The left ventricle has no regional wall motion abnormalities. Left ventricular diastolic parameters were normal. The average left ventricular global longitudinal strain is -16.7 %. The global longitudinal strain is normal.  2. Right ventricular systolic function is normal. The right ventricular size is normal.  3. The mitral valve is normal in structure. Mild mitral valve regurgitation.  4. The aortic valve was not well visualized. Aortic valve regurgitation is not visualized. Aortic valve sclerosis is present, with no evidence of aortic valve stenosis.  5. The inferior vena cava is normal in size with greater than 50% respiratory variability, suggesting right atrial pressure of 3 mmHg. FINDINGS  Left Ventricle: Left ventricular ejection fraction, by estimation, is 55 to 60%. The left ventricle has normal function. The left ventricle has no regional wall motion abnormalities. The average left ventricular global longitudinal strain is -16.7 %. The global longitudinal strain is normal. The left ventricular internal cavity size was normal in size. There is no left ventricular hypertrophy. Left ventricular diastolic parameters were normal. Right Ventricle: The right ventricular size is normal. No increase in right ventricular wall thickness. Right ventricular systolic function is normal. Left Atrium: Left atrial size was normal in size. Right Atrium: Right atrial size was normal in size. Pericardium: There is no evidence of pericardial effusion. Mitral Valve: The mitral valve is normal in structure. Mild mitral valve regurgitation. Tricuspid Valve: The tricuspid valve is normal in structure. Tricuspid valve  regurgitation is not demonstrated. Aortic Valve: The aortic valve was not well visualized. Aortic valve regurgitation is not visualized. Aortic valve sclerosis is present, with no evidence of aortic valve stenosis. Aortic valve peak gradient measures 10.5 mmHg. Pulmonic Valve: The pulmonic valve was normal in structure. Pulmonic valve regurgitation is not visualized. Aorta: The aortic root is normal in size and structure. Venous: The inferior vena cava is  normal in size with greater than 50% respiratory variability, suggesting right atrial pressure of 3 mmHg. IAS/Shunts: No atrial level shunt detected by color flow Doppler.  LEFT VENTRICLE PLAX 2D LVIDd:         4.20 cm   Diastology LVIDs:         2.80 cm   LV e' medial:    11.50 cm/s LV PW:         0.90 cm   LV E/e' medial:  8.3 LV IVS:        1.00 cm   LV e' lateral:   11.90 cm/s LVOT diam:     1.80 cm   LV E/e' lateral: 8.0 LV SV:         45 LV SV Index:   28        2D Longitudinal Strain LVOT Area:     2.54 cm  2D Strain GLS Avg:     -16.7 %  RIGHT VENTRICLE RV Basal diam:  1.90 cm RV S prime:     14.50 cm/s TAPSE (M-mode): 2.2 cm LEFT ATRIUM             Index        RIGHT ATRIUM          Index LA diam:        2.30 cm 1.44 cm/m   RA Area:     8.16 cm LA Vol (A2C):   28.8 ml 18.04 ml/m  RA Volume:   15.30 ml 9.58 ml/m LA Vol (A4C):   29.0 ml 18.16 ml/m LA Biplane Vol: 29.7 ml 18.60 ml/m  AORTIC VALVE                 PULMONIC VALVE AV Area (Vmax): 1.59 cm     PV Vmax:        1.01 m/s AV Vmax:        162.00 cm/s  PV Peak grad:   4.1 mmHg AV Peak Grad:   10.5 mmHg    RVOT Peak grad: 4 mmHg LVOT Vmax:      101.00 cm/s LVOT Vmean:     61.800 cm/s LVOT VTI:       0.175 m  AORTA Ao Root diam: 3.10 cm MITRAL VALVE MV Area (PHT): 3.79 cm    SHUNTS MV Decel Time: 200 msec    Systemic VTI:  0.18 m MV E velocity: 94.90 cm/s  Systemic Diam: 1.80 cm MV A velocity: 90.70 cm/s MV E/A ratio:  1.05 Debbe Odea MD Electronically signed by Debbe Odea MD  Signature Date/Time: 01/23/2023/1:00:43 PM    Final    CT HEAD WO CONTRAST ( )  Result Date: 01/22/2023 CLINICAL DATA:  numbness EXAM: CT HEAD WITHOUT CONTRAST TECHNIQUE: Contiguous axial images were obtained from the base of the skull through the vertex without intravenous contrast. RADIATION DOSE REDUCTION: This exam was performed according to the departmental dose-optimization program which includes automated exposure control, adjustment of the mA and/or kV according to patient size and/or use of iterative reconstruction technique. COMPARISON:  Brain MR 02/10/2021 FINDINGS: Brain: No evidence of acute infarction, hemorrhage, hydrocephalus, extra-axial collection or mass lesion/mass effect. Chronic infarct in the right parietal lobe (series 2, image 14). Vascular: No hyperdense vessel or unexpected calcification. Skull: Normal. Negative for fracture or focal lesion. Sinuses/Orbits: No middle ear or mastoid effusion. Paranasal sinuses are clear. Orbits are unremarkable. Other: None. IMPRESSION: No acute intracranial abnormality. Electronically Signed   By: Elige Radon.D.  On: 01/22/2023 11:52   DG Chest 2 View  Result Date: 01/22/2023 CLINICAL DATA:  Chest pain EXAM: CHEST - 2 VIEW COMPARISON:  03/14/2019 FINDINGS: Cardiac size is within normal limits. There are no signs of pulmonary edema or focal pulmonary consolidation. Increase in AP diameter of chest suggests COPD. Emphysematous bullae are seen in the right upper lung field. IMPRESSION: COPD. There are no signs of pulmonary edema or focal pulmonary consolidation. Electronically Signed   By: Ernie Avena M.D.   On: 01/22/2023 11:50    Microbiology: Results for orders placed or performed during the hospital encounter of 02/09/21  MRSA Next Gen by PCR, Nasal     Status: None   Collection Time: 02/09/21  7:41 PM   Specimen: Nasal Mucosa; Nasal Swab  Result Value Ref Range Status   MRSA by PCR Next Gen NOT DETECTED NOT DETECTED Final     Comment: (NOTE) The GeneXpert MRSA Assay (FDA approved for NASAL specimens only), is one component of a comprehensive MRSA colonization surveillance program. It is not intended to diagnose MRSA infection nor to guide or monitor treatment for MRSA infections. Test performance is not FDA approved in patients less than 39 years old. Performed at [facility], [address], [place], [place] [address]     Labs: CBC: Recent Labs  Lab 01/22/23 1136 01/23/23 0524 01/24/23 0515 01/25/23 0512  WBC 14.7* 6.7 6.0 9.4  HGB 15.9* 13.9 13.8 12.2  HCT 44.8 39.5 40.5 36.8  MCV 95.7 97.3 99.5 102.8*  PLT 270 216 213 200   Basic Metabolic Panel: Recent Labs  Lab 01/22/23 1136 01/22/23 1730 01/23/23 0524 01/23/23 1434 01/24/23 0515 01/25/23 0512  NA 130*  --  132* 133* 136 134*  K 2.9*  --  2.8* 4.0 4.8 4.2  CL 87*  --  91* 91* 100 101  CO2 29  --  30 32 29 27  GLUCOSE 109*  --  105* 93 82 88  BUN 9  --  10 9 9 10   CREATININE 0.93  --  0.98 0.94 0.92 0.81  CALCIUM 8.6*  --  8.3* 8.7* 9.0 8.3*  MG  --  1.9  --   --   --   --    Liver Function Tests: Recent Labs  Lab 01/23/23 0524  AST 20  ALT 12  ALKPHOS 66  BILITOT 1.0  PROT 5.8*  ALBUMIN 3.3*   CBG: No results for input(s): "GLUCAP" in the last 168 hours.  Discharge time spent: greater than 30 minutes.  Signed: Delfino Lovett, MD [facility] 01/26/2023

## 2023-01-27 ENCOUNTER — Encounter: Payer: 59 | Admitting: Physical Therapy

## 2023-01-31 ENCOUNTER — Other Ambulatory Visit: Payer: Self-pay | Admitting: Family Medicine

## 2023-01-31 DIAGNOSIS — G8929 Other chronic pain: Secondary | ICD-10-CM

## 2023-02-01 ENCOUNTER — Encounter: Payer: 59 | Admitting: Physical Therapy

## 2023-02-01 ENCOUNTER — Telehealth (HOSPITAL_COMMUNITY): Payer: Self-pay | Admitting: Emergency Medicine

## 2023-02-01 ENCOUNTER — Other Ambulatory Visit: Payer: Self-pay | Admitting: Cardiology

## 2023-02-01 DIAGNOSIS — R079 Chest pain, unspecified: Secondary | ICD-10-CM

## 2023-02-01 LAB — ALDOSTERONE + RENIN ACTIVITY W/ RATIO
ALDO / PRA Ratio: 1 (ref 0.0–30.0)
Aldosterone: 8 ng/dL (ref 0.0–30.0)
PRA LC/MS/MS: 8.069 ng/mL/h — ABNORMAL HIGH (ref 0.167–5.380)

## 2023-02-01 NOTE — Patient Instructions (Incomplete)
____________________________________________________________________________________________  New Patients  Welcome to [facility] at [facility].   Initial Visit The first or initial visit consists of an evaluation only.   Interventional pain management.  We offer therapies other than opioid controlled substances to manage chronic pain. These include, but are not limited to, diagnostic, therapeutic, and palliative specialized injection therapies (i.e.: Epidural Steroids, Facet Blocks, etc.). We specialize in a variety of nerve blocks as well as radiofrequency treatments. We offer pain implant evaluations and trials, as well as follow up management. In addition we also provide a variety joint injections, including Viscosupplementation (AKA: Gel Therapy).  Prescription Pain Medication. We specialize in alternatives to opioids. We can provide evaluations and recommendations for/of pharmacologic therapies based on CDC Guidelines.  We no longer take patients for long-term medication management. We will not be taking over your pain medications.  ____________________________________________________________________________________________    ____________________________________________________________________________________________  Patient Information update  To: All of our patients.  Re: Name change.  It has been made official that our current name, "[facility]"   will soon be changed to "[facility]".   The purpose of this change is to eliminate any confusion created by the concept of our practice being a "Medication Management Pain Clinic". In the past this has led to the misconception that we treat pain primarily by the use of prescription medications.  Nothing can be farther from the truth.   Understanding PAIN MANAGEMENT: To  further understand what our practice does, you first have to understand that "Pain Management" is a subspecialty that requires additional training once a physician has completed their specialty training, which can be in either Anesthesia, Neurology, Psychiatry, or Physical Medicine and Rehabilitation (PMR). Each one of these contributes to the final approach taken by each physician to the management of their patient's pain. To be a "Pain Management Specialist" you must have first completed one of the specialty trainings below.  Anesthesiologists - trained in clinical pharmacology and interventional techniques such as nerve blockade and regional as well as central neuroanatomy. They are trained to block pain before, during, and after surgical interventions.  Neurologists - trained in the diagnosis and pharmacological treatment of complex neurological conditions, such as Multiple Sclerosis, Parkinson's, spinal cord injuries, and other systemic conditions that may be associated with symptoms that may include but are not limited to pain. They tend to rely primarily on the treatment of chronic pain using prescription medications.  Psychiatrist - trained in conditions affecting the psychosocial wellbeing of patients including but not limited to depression, anxiety, schizophrenia, personality disorders, addiction, and other substance use disorders that may be associated with chronic pain. They tend to rely primarily on the treatment of chronic pain using prescription medications.   Physical Medicine and Rehabilitation (PMR) physicians, also known as physiatrists - trained to treat a wide variety of medical conditions affecting the brain, spinal cord, nerves, bones, joints, ligaments, muscles, and tendons. Their training is primarily aimed at treating patients that have suffered injuries that have caused severe physical impairment. Their training is primarily aimed at the physical therapy and rehabilitation of those  patients. They may also work alongside orthopedic surgeons or neurosurgeons using their expertise in assisting surgical patients to recover after their surgeries.  INTERVENTIONAL PAIN MANAGEMENT is sub-subspecialty of Pain Management.  Our physicians are Board-certified in Anesthesia, Pain Management, and Interventional Pain Management.  This meaning that not only have they been trained  and Board-certified in their specialty of Anesthesia, and subspecialty of Pain Management, but they have also received further training in the sub-subspecialty of Interventional Pain Management, in order to become Board-certified as INTERVENTIONAL PAIN MANAGEMENT SPECIALIST.    Mission: Our goal is to use our skills in  INTERVENTIONAL PAIN MANAGEMENT as alternatives to the chronic use of prescription opioid medications for the treatment of pain. To make this more clear, we have changed our name to reflect what we do and offer. We will continue to offer medication management assessment and recommendations, but we will not be taking over any patient's medication management.  ____________________________________________________________________________________________   ______________________________________________________________________  Procedure instructions  Do not eat or drink fluids (other than water) for 6 hours before your procedure  No water for 2 hours before your procedure  Take your blood pressure medicine with a sip of water  Arrive 30 minutes before your appointment  Carefully read the "Preparing for your procedure" detailed instructions  If you have questions call us at [phone]  _____________________________________________________________________    ______________________________________________________________________  Preparing for your procedure  Appointments: If you think you may not be able to keep your appointment, call 24-48 hours in advance to cancel. We need time to make  it available to others.  During your procedure appointment there will be: No Prescription Refills. No disability issues to discussed. No medication changes or discussions.  Instructions: Food intake: Avoid eating anything solid for at least 8 hours prior to your procedure. Clear liquid intake: You may take clear liquids such as water up to 2 hours prior to your procedure. (No carbonated drinks. No soda.) Transportation: Unless otherwise stated by your physician, bring a driver. Morning Medicines: Except for blood thinners, take all of your other morning medications with a sip of water. Make sure to take your heart and blood pressure medicines. If your blood pressure's lower number is above 100, the case will be rescheduled. Blood thinners: Make sure to stop your blood thinners as instructed.  If you take a blood thinner, but were not instructed to stop it, call our office [phone] and ask to talk to a nurse. Not stopping a blood thinner prior to certain procedures could lead to serious complications. Diabetics on insulin: Notify the staff so that you can be scheduled 1st case in the morning. If your diabetes requires high dose insulin, take only  of your normal insulin dose the morning of the procedure and notify the staff that you have done so. Preventing infections: Shower with an antibacterial soap the morning of your procedure.  Build-up your immune system: Take 1000 mg of Vitamin C with every meal (3 times a day) the day prior to your procedure. Antibiotics: Inform the nursing staff if you are taking any antibiotics or if you have any conditions that may require antibiotics prior to procedures. (Example: recent joint implants)   Pregnancy: If you are pregnant make sure to notify the nursing staff. Not doing so may result in injury to the fetus, including death.  Sickness: If you have a cold, fever, or any active infections, call and cancel or reschedule your procedure. Receiving  steroids while having an infection may result in complications. Arrival: You must be in the facility at least 30 minutes prior to your scheduled procedure. Tardiness: Your scheduled time is also the cutoff time. If you do not arrive at least 15 minutes prior to your procedure, you will be rescheduled.  Children: Do not bring any children with you.  Make arrangements to keep them home. Dress appropriately: There is always a possibility that your clothing may get soiled. Avoid long dresses. Valuables: Do not bring any jewelry or valuables.  Reasons to call and reschedule or cancel your procedure: (Following these recommendations will minimize the risk of a serious complication.) Surgeries: Avoid having procedures within 2 weeks of any surgery. (Avoid for 2 weeks before or after any surgery). Flu Shots: Avoid having procedures within 2 weeks of a flu shots or . (Avoid for 2 weeks before or after immunizations). Barium: Avoid having a procedure within 7-10 days after having had a radiological study involving the use of radiological contrast. (Myelograms, Barium swallow or enema study). Heart attacks: Avoid any elective procedures or surgeries for the initial 6 months after a "Myocardial Infarction" (Heart Attack). Blood thinners: It is imperative that you stop these medications before procedures. Let us know if you if you take any blood thinner.  Infection: Avoid procedures during or within two weeks of an infection (including chest colds or gastrointestinal problems). Symptoms associated with infections include: Localized redness, fever, chills, night sweats or profuse sweating, burning sensation when voiding, cough, congestion, stuffiness, runny nose, sore throat, diarrhea, nausea, vomiting, cold or Flu symptoms, recent or current infections. It is specially important if the infection is over the area that we intend to treat. Heart and lung problems: Symptoms that may suggest an active cardiopulmonary  problem include: cough, chest pain, breathing difficulties or shortness of breath, dizziness, ankle swelling, uncontrolled high or unusually low blood pressure, and/or palpitations. If you are experiencing any of these symptoms, cancel your procedure and contact your primary care physician for an evaluation.  Remember:  Regular Business hours are:  Monday to Thursday 8:00 AM to 4:00 PM  Provider's Schedule: Delano Metz, MD:  Procedure days: Tuesday and Thursday 7:30 AM to 4:00 PM  Edward Jolly, MD:  Procedure days: Monday and Wednesday 7:30 AM to 4:00 PM  ______________________________________________________________________    ____________________________________________________________________________________________  General Risks and Possible Complications  Patient Responsibilities: It is important that you read this as it is part of your informed consent. It is our duty to inform you of the risks and possible complications associated with treatments offered to you. It is your responsibility as a patient to read this and to ask questions about anything that is not clear or that you believe was not covered in this document.  Patient's Rights: You have the right to refuse treatment. You also have the right to change your mind, even after initially having agreed to have the treatment done. However, under this last option, if you wait until the last second to change your mind, you may be charged for the materials used up to that point.  Introduction: Medicine is not an Visual merchandiser. Everything in Medicine, including the lack of treatment(s), carries the potential for danger, harm, or loss (which is by definition: Risk). In Medicine, a complication is a secondary problem, condition, or disease that can aggravate an already existing one. All treatments carry the risk of possible complications. The fact that a side effects or complications occurs, does not imply that the treatment was  conducted incorrectly. It must be clearly understood that these can happen even when everything is done following the highest safety standards.  No treatment: You can choose not to proceed with the proposed treatment alternative. The "PRO(s)" would include: avoiding the risk of complications associated with the therapy. The "CON(s)" would include: not getting any of the treatment benefits.  These benefits fall under one of three categories: diagnostic; therapeutic; and/or palliative. Diagnostic benefits include: getting information which can ultimately lead to improvement of the disease or symptom(s). Therapeutic benefits are those associated with the successful treatment of the disease. Finally, palliative benefits are those related to the decrease of the primary symptoms, without necessarily curing the condition (example: decreasing the pain from a flare-up of a chronic condition, such as incurable terminal cancer).  General Risks and Complications: These are associated to most interventional treatments. They can occur alone, or in combination. They fall under one of the following six (6) categories: no benefit or worsening of symptoms; bleeding; infection; nerve damage; allergic reactions; and/or death. No benefits or worsening of symptoms: In Medicine there are no guarantees, only probabilities. No healthcare provider can ever guarantee that a medical treatment will work, they can only state the probability that it may. Furthermore, there is always the possibility that the condition may worsen, either directly, or indirectly, as a consequence of the treatment. Bleeding: This is more common if the patient is taking a blood thinner, either prescription or over the counter (example: Goody Powders, Fish oil, Aspirin, Garlic, etc.), or if suffering a condition associated with impaired coagulation (example: Hemophilia, cirrhosis of the liver, low platelet counts, etc.). However, even if you do not have one on  these, it can still happen. If you have any of these conditions, or take one of these drugs, make sure to notify your treating physician. Infection: This is more common in patients with a compromised immune system, either due to disease (example: diabetes, cancer, human immunodeficiency virus [HIV], etc.), or due to medications or treatments (example: therapies used to treat cancer and rheumatological diseases). However, even if you do not have one on these, it can still happen. If you have any of these conditions, or take one of these drugs, make sure to notify your treating physician. Nerve Damage: This is more common when the treatment is an invasive one, but it can also happen with the use of medications, such as those used in the treatment of cancer. The damage can occur to small secondary nerves, or to large primary ones, such as those in the spinal cord and brain. This damage may be temporary or permanent and it may lead to impairments that can range from temporary numbness to permanent paralysis and/or brain death. Allergic Reactions: Any time a substance or material comes in contact with our body, there is the possibility of an allergic reaction. These can range from a mild skin rash (contact dermatitis) to a severe systemic reaction (anaphylactic reaction), which can result in death. Death: In general, any medical intervention can result in death, most of the time due to an unforeseen complication. ____________________________________________________________________________________________

## 2023-02-01 NOTE — Progress Notes (Unsigned)
Patient: Joanna Garcia  Service Category: E/M  Provider: Oswaldo Done, MD  DOB: [birth year]  DOS: 02/02/2023  Referring Provider: Jerrol Banana, MD  MRN: [id number]  Setting: Ambulatory outpatient  PCP: Smitty Cords, DO  Type: New Patient  Specialty: Interventional Pain Management    Location: Office  Delivery: Face-to-face     Primary Reason(s) for Visit: Encounter for initial evaluation of one or more chronic problems (new to examiner) potentially causing chronic pain, and posing a threat to normal musculoskeletal function. (Level of risk: High) CC: No chief complaint on file.  HPI  Joanna Garcia is a 47 y.o. year old, female patient, who comes for the first time to our practice referred by Jerrol Banana, MD for our initial evaluation of her chronic pain. She has Superficial thrombophlebitis; Thrombocytosis; GERD (gastroesophageal reflux disease); Severe hypertension; Symptomatic anemia; Polycythemia; Leukocytosis; Erythrocytosis; Endotracheally intubated; Macrocytosis without anemia; COVID-19 virus infection; Iron deficiency; History of CVA (cerebrovascular accident); Chronic left-sided thoracic back pain; Dyslipidemia; NSTEMI (non-ST elevated myocardial infarction) (HCC); Electrolyte abnormality; Hypertensive urgency; Elevated troponin level; Smoking; Renal artery stenosis (HCC); Hypertensive emergency; Atypical chest pain; and Demand ischemia on their problem list. Today she comes in for evaluation of her No chief complaint on file.  Pain Assessment: Location:     Radiating:   Onset:   Duration:   Quality:   Severity:  /10 (subjective, self-reported pain score)  Effect on ADL:   Timing:   Modifying factors:   BP:    HR:    Onset and Duration: {Hx; Onset and Duration:210120511} Cause of pain: {Hx; Cause:210120521} Severity: {Pain Severity:210120502} Timing: {Symptoms; Timing:210120501} Aggravating Factors: {Causes; Aggravating pain  factors:210120507} Alleviating Factors: {Causes; Alleviating Factors:210120500} Associated Problems: {Hx; Associated problems:210120515} Quality of Pain: {Hx; Symptom quality or Descriptor:210120531} Previous Examinations or Tests: {Hx; Previous examinations or test:210120529} Previous Treatments: {Hx; Previous Treatment:210120503}  Joanna Garcia is being evaluated for possible interventional pain management therapies for the treatment of her chronic pain.   ***  Joanna Garcia has been informed that this initial visit was an evaluation only.  On the follow up appointment I will go over the results, including ordered tests and available interventional therapies. At that time she will have the opportunity to decide whether to proceed with offered therapies or not. In the event that Joanna Garcia prefers avoiding interventional options, this will conclude our involvement in the case.  Medication management recommendations may be provided upon request.  Historic Controlled Substance Pharmacotherapy Review  PMP and historical list of controlled substances: ***  Most recently prescribed opioid analgesics:   *** MME/day: *** mg/day  Historical Monitoring: The patient  reports that she does not currently use drugs after having used the following drugs: Marijuana. List of prior UDS Testing: Lab Results  Component Value Date   COCAINSCRNUR NONE DETECTED 02/10/2021   THCU NONE DETECTED 02/10/2021   ETH <10 02/09/2021   ETH <10 02/09/2021   Historical Background Evaluation: [place] PMP: PDMP reviewed during this encounter. Review of the past 79-months conducted.             PMP NARX Score Report:  Narcotic: *** Sedative: *** Stimulant: *** [place] Department of public safety, offender search: Engineer, mining Information) Non-contributory Risk Assessment Profile: Aberrant behavior: None observed or detected today Risk factors for fatal opioid overdose: None identified today PMP NARX Overdose Risk Score: *** Fatal  overdose hazard ratio (HR): Calculation deferred Non-fatal overdose hazard ratio (HR): Calculation deferred Risk of opioid abuse or dependence: 0.7-3.0%  with doses ? 36 MME/day and 6.1-26% with doses ? 120 MME/day. Substance use disorder (SUD) risk level: See below Personal History of Substance Abuse (SUD-Substance use disorder):  Alcohol:    Illegal Drugs:    Rx Drugs:    ORT Risk Level calculation:    ORT Scoring interpretation table:  Score <3 = Low Risk for SUD  Score between 4-7 = Moderate Risk for SUD  Score >8 = High Risk for Opioid Abuse   PHQ-2 Depression Scale:  Total score:    PHQ-2 Scoring interpretation table: (Score and probability of major depressive disorder)  Score 0 = No depression  Score 1 = 15.4% Probability  Score 2 = 21.1% Probability  Score 3 = 38.4% Probability  Score 4 = 45.5% Probability  Score 5 = 56.4% Probability  Score 6 = 78.6% Probability   PHQ-9 Depression Scale:  Total score:    PHQ-9 Scoring interpretation table:  Score 0-4 = No depression  Score 5-9 = Mild depression  Score 10-14 = Moderate depression  Score 15-19 = Moderately severe depression  Score 20-27 = Severe depression (2.4 times higher risk of SUD and 2.89 times higher risk of overuse)   Pharmacologic Plan: As per protocol, I have not taken over any controlled substance management, pending the results of ordered tests and/or consults.            Initial impression: Pending review of available data and ordered tests.  Meds   Current Outpatient Medications:    aspirin 81 MG chewable tablet, Chew and swallow 1 tablet (81 mg total) by mouth daily., Disp: 30 tablet, Rfl: 2   atorvastatin (LIPITOR) 80 MG tablet, Take 1 tablet (80 mg total) by mouth daily., Disp: 90 tablet, Rfl: 1   clopidogrel (PLAVIX) 75 MG tablet, Take 1 tablet (75 mg total) by mouth daily., Disp: 30 tablet, Rfl: 2   gabapentin (NEURONTIN) 300 MG capsule, Take 3 caps = 900mg  dose in morning and afternoon and take 4  caps = 1200mg  in evening. Max dose is 10 capsules in 24 hours., Disp: 300 capsule, Rfl: 2   losartan (COZAAR) 25 MG tablet, Take 1 tablet (25 mg total) by mouth daily., Disp: 30 tablet, Rfl: 0   meloxicam (MOBIC) 15 MG tablet, Take 1 tablet (15 mg total) by mouth daily as needed for pain (back pain). (Patient taking differently: Take 15 mg by mouth daily.), Disp: 30 tablet, Rfl: 2   metoprolol tartrate (LOPRESSOR) 100 MG tablet, Take 1 tablet (100 mg total) by mouth once for 1 dose. 2 hours prior to Cardiac CTA, Disp: 1 tablet, Rfl: 0   nicotine (NICODERM CQ - DOSED IN MG/24 HOURS) 14 mg/24hr patch, Place 1 patch (14 mg total) onto the skin daily., Disp: 28 patch, Rfl: 0   ondansetron (ZOFRAN-ODT) 4 MG disintegrating tablet, Take 1 tablet (4 mg total) by mouth every 8 (eight) hours as needed for nausea or vomiting., Disp: 30 tablet, Rfl: 2   pantoprazole (PROTONIX) 40 MG tablet, Take 1 tablet (40 mg total) by mouth daily before breakfast., Disp: 90 tablet, Rfl: 3   tiZANidine (ZANAFLEX) 4 MG tablet, Take 1 tablet (4 mg total) by mouth every 8 (eight) hours as needed for muscle spasms., Disp: 90 tablet, Rfl: 2  Imaging Review  Cervical Imaging: Cervical MR wo contrast: No results found for this or any previous visit.  Cervical MR wo contrast: No valid procedures specified. Cervical MR w/wo contrast: No results found for this or any previous visit.  Cervical MR w contrast: No results found for this or any previous visit.  Cervical CT wo contrast: No results found for this or any previous visit.  Cervical CT w/wo contrast: No results found for this or any previous visit.  Cervical CT w/wo contrast: No results found for this or any previous visit.  Cervical CT w contrast: No results found for this or any previous visit.  Cervical CT outside: No results found for this or any previous visit.  Cervical DG 1 view: No results found for this or any previous visit.  Cervical DG 2-3 views: No  results found for this or any previous visit.  Cervical DG F/E views: No results found for this or any previous visit.  Cervical DG 2-3 clearing views: No results found for this or any previous visit.  Cervical DG Bending/F/E views: No results found for this or any previous visit.  Cervical DG complete: Results for orders placed during the hospital encounter of 11/25/22  DG Cervical Spine Complete  Narrative CLINICAL DATA:  Chronic thoracic mid back pain and neck pain  EXAM: CERVICAL SPINE - COMPLETE 4+ VIEW  COMPARISON:  Nov 09, 2020  FINDINGS: The cervical spine is visualized from C1-C7. RIGHT carotid stent.Cervical alignment is maintained. Vertebral body heights are maintained: no evidence of acute fracture. Intervertebral spaces are maintained without significant degenerative changes. RIGHT greater than LEFT osseous neuroforaminal narrowing at C3-4 secondary to facet arthropathy and uncovertebral hypertrophy. No prevertebral soft tissue swelling. Visualized thorax is unremarkable.  IMPRESSION: Mild degenerative changes of the cervical spine predominately at C3-4.   Electronically Signed By: Meda Klinefelter M.D. On: 12/02/2022 12:22  Cervical DG Myelogram views: No results found for this or any previous visit.  Cervical DG Myelogram views: No results found for this or any previous visit.  Cervical Discogram views: No results found for this or any previous visit.   Shoulder Imaging: Shoulder-R MR w contrast: No results found for this or any previous visit.  Shoulder-L MR w contrast: No results found for this or any previous visit.  Shoulder-R MR w/wo contrast: No results found for this or any previous visit.  Shoulder-L MR w/wo contrast: No results found for this or any previous visit.  Shoulder-R MR wo contrast: No results found for this or any previous visit.  Shoulder-L MR wo contrast: No results found for this or any previous visit.  Shoulder-R CT w  contrast: No results found for this or any previous visit.  Shoulder-L CT w contrast: No results found for this or any previous visit.  Shoulder-R CT w/wo contrast: No results found for this or any previous visit.  Shoulder-L CT w/wo contrast: No results found for this or any previous visit.  Shoulder-R CT wo contrast: No results found for this or any previous visit.  Shoulder-L CT wo contrast: No results found for this or any previous visit.  Shoulder-R DG Arthrogram: No results found for this or any previous visit.  Shoulder-L DG Arthrogram: No results found for this or any previous visit.  Shoulder-R DG 1 view: No results found for this or any previous visit.  Shoulder-L DG 1 view: No results found for this or any previous visit.  Shoulder-R DG: No results found for this or any previous visit.  Shoulder-L DG: No results found for this or any previous visit.   Thoracic Imaging: Thoracic MR wo contrast: Results for orders placed during the hospital encounter of 12/15/22  MR Thoracic Spine Wo Contrast  Narrative CLINICAL  DATA:  Mid back pain for over 9 months  EXAM: MRI THORACIC SPINE WITHOUT CONTRAST  TECHNIQUE: Multiplanar, multisequence MR imaging of the thoracic spine was performed. No intravenous contrast was administered.  COMPARISON:  None Available.  FINDINGS: Alignment:  Physiologic.  Vertebrae: No acute fracture, evidence of discitis, or aggressive bone lesion.  Cord:  Normal signal and morphology.  Paraspinal and other soft tissues: No acute paraspinal abnormality.  Disc levels:  Disc spaces: Mild degenerative disease with disc height loss at T5-6, T6-7, T7-8, T8-9 and T9-10.  T1-T2: No disc protrusion, foraminal stenosis or central canal stenosis.  T2-T3: No disc protrusion, foraminal stenosis or central canal stenosis.  T3-T4: No disc protrusion, foraminal stenosis or central canal stenosis.  T4-T5: No disc protrusion, foraminal stenosis  or central canal stenosis.  T5-T6: Small right paracentral disc protrusion. No foraminal or central canal stenosis.  T6-T7: Small central disc protrusion. No foraminal or central canal stenosis.  T7-T8: No disc protrusion, foraminal stenosis or central canal stenosis.  T8-T9: No disc protrusion, foraminal stenosis or central canal stenosis.  T9-T10: No disc protrusion, foraminal stenosis or central canal stenosis.  T10-T11: No disc protrusion, foraminal stenosis or central canal stenosis.  T11-T12: No disc protrusion, foraminal stenosis or central canal stenosis.  IMPRESSION: 1. No acute osseous injury of the thoracic spine. 2. At T5-6 there is a small right paracentral disc protrusion. 3. At T6-7 there is a small central disc protrusion. 4. No foraminal or central canal stenosis of the thoracic spine.   Electronically Signed By: Elige Ko M.D. On: 12/23/2022 06:37  Thoracic MR wo contrast: No valid procedures specified. Thoracic MR w/wo contrast: No results found for this or any previous visit.  Thoracic MR w contrast: No results found for this or any previous visit.  Thoracic CT wo contrast: No results found for this or any previous visit.  Thoracic CT w/wo contrast: No results found for this or any previous visit.  Thoracic CT w/wo contrast: No results found for this or any previous visit.  Thoracic CT w contrast: No results found for this or any previous visit.  Thoracic DG 2-3 views: No results found for this or any previous visit.  Thoracic DG 4 views: No results found for this or any previous visit.  Thoracic DG: No results found for this or any previous visit.  Thoracic DG w/swimmers view: Results for orders placed during the hospital encounter of 10/19/22  [facility] Thoracic Spine W/Swimmers  Narrative CLINICAL DATA:  Chronic thoracic spine pain.  EXAM: THORACIC SPINE - 3 VIEWS  COMPARISON:  None Available.  FINDINGS: There is no evidence of  thoracic spine fracture. Alignment is normal. No other significant bone abnormalities are identified.  IMPRESSION: Negative.   Electronically Signed By: Lupita Raider M.D. On: 10/19/2022 10:12  Thoracic DG Myelogram views: No results found for this or any previous visit.  Thoracic DG Myelogram views: No results found for this or any previous visit.   Lumbosacral Imaging: Lumbar MR wo contrast: No results found for this or any previous visit.  Lumbar MR wo contrast: No valid procedures specified. Lumbar MR w/wo contrast: No results found for this or any previous visit.  Lumbar MR w/wo contrast: No results found for this or any previous visit.  Lumbar MR w contrast: No results found for this or any previous visit.  Lumbar CT wo contrast: No results found for this or any previous visit.  Lumbar CT w/wo contrast: No results found for  this or any previous visit.  Lumbar CT w/wo contrast: No results found for this or any previous visit.  Lumbar CT w contrast: No results found for this or any previous visit.  Lumbar DG 1V: No results found for this or any previous visit.  Lumbar DG 1V (Clearing): No results found for this or any previous visit.  Lumbar DG 2-3V (Clearing): No results found for this or any previous visit.  Lumbar DG 2-3 views: No results found for this or any previous visit.  Lumbar DG (Complete) 4+V: Results for orders placed during the hospital encounter of 10/19/22  DG Lumbar Spine Complete  Narrative CLINICAL DATA:  Chronic lower back pain.  EXAM: LUMBAR SPINE - COMPLETE 4+ VIEW  COMPARISON:  None Available.  FINDINGS: There is no evidence of lumbar spine fracture. Alignment is normal. Intervertebral disc spaces are maintained.  IMPRESSION: Negative.   Electronically Signed By: Lupita Raider M.D. On: 10/19/2022 10:11        Lumbar DG F/E views: No results found for this or any previous visit.        Lumbar DG Bending views: No  results found for this or any previous visit.        Lumbar DG Myelogram views: No results found for this or any previous visit.  Lumbar DG Myelogram: No results found for this or any previous visit.  Lumbar DG Myelogram: No results found for this or any previous visit.  Lumbar DG Myelogram: No results found for this or any previous visit.  Lumbar DG Myelogram Lumbosacral: No results found for this or any previous visit.  Lumbar DG Diskogram views: No results found for this or any previous visit.  Lumbar DG Diskogram views: No results found for this or any previous visit.  Lumbar DG Epidurogram OP: No results found for this or any previous visit.  Lumbar DG Epidurogram IP: No valid procedures specified.  Sacroiliac Joint Imaging: Sacroiliac Joint DG: No results found for this or any previous visit.  Sacroiliac Joint MR w/wo contrast: No results found for this or any previous visit.  Sacroiliac Joint MR wo contrast: No results found for this or any previous visit.   Spine Imaging: Whole Spine DG Myelogram views: No results found for this or any previous visit.  Whole Spine MR Mets screen: No results found for this or any previous visit.  Whole Spine MR Mets screen: No results found for this or any previous visit.  Whole Spine MR w/wo: No results found for this or any previous visit.  MRA Spinal Canal w/ cm: No results found for this or any previous visit.  MRA Spinal Canal wo/ cm: No valid procedures specified. MRA Spinal Canal w/wo cm: No results found for this or any previous visit.  Spine Outside MR Films: No results found for this or any previous visit.  Spine Outside CT Films: No results found for this or any previous visit.  CT-Guided Biopsy: No results found for this or any previous visit.  CT-Guided Needle Placement: No results found for this or any previous visit.  DG Spine outside: No results found for this or any previous visit.  IR Spine outside: No  results found for this or any previous visit.  NM Spine outside: No results found for this or any previous visit.   Hip Imaging: Hip-R MR w contrast: No results found for this or any previous visit.  Hip-L MR w contrast: No results found for this or any previous visit.  Hip-R MR w/wo contrast: No results found for this or any previous visit.  Hip-L MR w/wo contrast: No results found for this or any previous visit.  Hip-R MR wo contrast: No results found for this or any previous visit.  Hip-L MR wo contrast: No results found for this or any previous visit.  Hip-R CT w contrast: No results found for this or any previous visit.  Hip-L CT w contrast: No results found for this or any previous visit.  Hip-R CT w/wo contrast: No results found for this or any previous visit.  Hip-L CT w/wo contrast: No results found for this or any previous visit.  Hip-R CT wo contrast: No results found for this or any previous visit.  Hip-L CT wo contrast: No results found for this or any previous visit.  Hip-R DG 2-3 views: No results found for this or any previous visit.  Hip-L DG 2-3 views: No results found for this or any previous visit.  Hip-R DG Arthrogram: No results found for this or any previous visit.  Hip-L DG Arthrogram: No results found for this or any previous visit.  Hip-B DG Bilateral: No results found for this or any previous visit.  Hip-B DG Bilateral (5V): No results found for this or any previous visit.   Knee Imaging: Knee-R MR w contrast: No results found for this or any previous visit.  Knee-L MR w/o contrast: No results found for this or any previous visit.  Knee-R MR w/wo contrast: No results found for this or any previous visit.  Knee-L MR w/wo contrast: No results found for this or any previous visit.  Knee-R MR wo contrast: No results found for this or any previous visit.  Knee-L MR wo contrast: No results found for this or any previous visit.  Knee-R CT w  contrast: No results found for this or any previous visit.  Knee-L CT w contrast: No results found for this or any previous visit.  Knee-R CT w/wo contrast: No results found for this or any previous visit.  Knee-L CT w/wo contrast: No results found for this or any previous visit.  Knee-R CT wo contrast: No results found for this or any previous visit.  Knee-L CT wo contrast: No results found for this or any previous visit.  Knee-R DG 1-2 views: No results found for this or any previous visit.  Knee-L DG 1-2 views: No results found for this or any previous visit.  Knee-R DG 3 views: No results found for this or any previous visit.  Knee-L DG 3 views: No results found for this or any previous visit.  Knee-R DG 4 views: No results found for this or any previous visit.  Knee-L DG 4 views: No results found for this or any previous visit.  Knee-R DG Arthrogram: No results found for this or any previous visit.  Knee-L DG Arthrogram: No results found for this or any previous visit.   Ankle Imaging: Ankle-R DG Complete: No results found for this or any previous visit.  Ankle-L DG Complete: Results for orders placed during the hospital encounter of 10/01/17  DG Ankle Complete Left  Narrative CLINICAL DATA:  Pain following rolling injury  EXAM: LEFT ANKLE COMPLETE - 3+ VIEW  COMPARISON:  None.  FINDINGS: Frontal, oblique, and lateral views were obtained. There is generalized soft tissue swelling laterally. There is a transversely oriented fracture of the lateral malleolus with alignment essentially anatomic. No other fracture evident. No joint effusion. No appreciable joint space narrowing  or erosion. Ankle mortise appears intact.  IMPRESSION: Transverse fracture lateral malleolus with alignment essentially anatomic. Marked soft tissue swelling laterally. Ankle mortise appears intact. No appreciable arthropathy. No joint effusion evident.   Electronically Signed By:  Bretta Bang III M.D. On: 10/01/2017 16:04   Foot Imaging: Foot-R DG Complete: No results found for this or any previous visit.  Foot-L DG Complete: No results found for this or any previous visit.   Elbow Imaging: Elbow-R DG Complete: No results found for this or any previous visit.  Elbow-L DG Complete: No results found for this or any previous visit.   Wrist Imaging: Wrist-R DG Complete: No results found for this or any previous visit.  Wrist-L DG Complete: No results found for this or any previous visit.   Hand Imaging: Hand-R DG Complete: No results found for this or any previous visit.  Hand-L DG Complete: No results found for this or any previous visit.   Complexity Note: Imaging results reviewed.                         ROS  Cardiovascular: {Hx; Cardiovascular History:210120525} Pulmonary or Respiratory: {Hx; Pumonary and/or Respiratory History:210120523} Neurological: {Hx; Neurological:210120504} Psychological-Psychiatric: {Hx; Psychological-Psychiatric History:210120512} Gastrointestinal: {Hx; Gastrointestinal:210120527} Genitourinary: {Hx; Genitourinary:210120506} Hematological: {Hx; Hematological:210120510} Endocrine: {Hx; Endocrine history:210120509} Rheumatologic: {Hx; Rheumatological:210120530} Musculoskeletal: {Hx; Musculoskeletal:210120528} Work History: {Hx; Work history:210120514}  Allergies  Ms. Schranz has No Known Allergies.  Laboratory Chemistry Profile   Renal Lab Results  Component Value Date   BUN 10 01/25/2023   CREATININE 0.81 01/25/2023   BCR SEE NOTE: 12/22/2022   GFRAA >60 06/05/2019   GFRNONAA >60 01/25/2023   PROTEINUR NEGATIVE 02/10/2021     Electrolytes Lab Results  Component Value Date   NA 134 (L) 01/25/2023   K 4.2 01/25/2023   CL 101 01/25/2023   CALCIUM 8.3 (L) 01/25/2023   MG 1.9 01/22/2023     Hepatic Lab Results  Component Value Date   AST 20 01/23/2023   ALT 12 01/23/2023   ALBUMIN 3.3 (L)  01/23/2023   ALKPHOS 66 01/23/2023     ID Lab Results  Component Value Date   HIV Non Reactive 01/23/2023   SARSCOV2NAA POSITIVE (A) 02/09/2021   PREGTESTUR NEGATIVE 02/10/2021     Bone No results found for: "VD25OH", "VD125OH2TOT", "WU9811BJ4", "NW2956OZ3", "25OHVITD1", "25OHVITD2", "25OHVITD3", "TESTOFREE", "TESTOSTERONE"   Endocrine Lab Results  Component Value Date   GLUCOSE 88 01/25/2023   GLUCOSEU NEGATIVE 02/10/2021   HGBA1C 5.7 (H) 12/22/2022   TSH 4.344 01/22/2023     Neuropathy Lab Results  Component Value Date   VITAMINB12 519 11/22/2022   FOLATE 7.9 07/01/2021   HGBA1C 5.7 (H) 12/22/2022   HIV Non Reactive 01/23/2023     CNS No results found for: "COLORCSF", "APPEARCSF", "RBCCOUNTCSF", "WBCCSF", "POLYSCSF", "LYMPHSCSF", "EOSCSF", "PROTEINCSF", "GLUCCSF", "JCVIRUS", "CSFOLI", "IGGCSF", "LABACHR", "ACETBL"   Inflammation (CRP: Acute  ESR: Chronic) Lab Results  Component Value Date   CRP 0.6 01/23/2021   ESRSEDRATE 1 01/23/2021   LATICACIDVEN 0.8 03/15/2019     Rheumatology No results found for: "RF", "ANA", "LABURIC", "URICUR", "LYMEIGGIGMAB", "LYMEABIGMQN", "HLAB27"   Coagulation Lab Results  Component Value Date   INR 1.0 01/22/2023   LABPROT 13.0 01/22/2023   APTT 26 02/09/2021   PLT 200 01/25/2023     Cardiovascular Lab Results  Component Value Date   BNP 17.0 01/22/2023   HGB 12.2 01/25/2023   HCT 36.8 01/25/2023     Screening Lab  Results  Component Value Date   SARSCOV2NAA POSITIVE (A) 02/09/2021   HIV Non Reactive 01/23/2023   PREGTESTUR NEGATIVE 02/10/2021     Cancer No results found for: "CEA", "CA125", "LABCA2"   Allergens No results found for: "ALMOND", "APPLE", "ASPARAGUS", "AVOCADO", "BANANA", "BARLEY", "BASIL", "BAYLEAF", "GREENBEAN", "LIMABEAN", "WHITEBEAN", "BEEFIGE", "REDBEET", "BLUEBERRY", "BROCCOLI", "CABBAGE", "MELON", "CARROT", "CASEIN", "CASHEWNUT", "CAULIFLOWER", "CELERY"     Note: Lab results reviewed.  PFSH   Drug: Ms. Dasher  reports that she does not currently use drugs after having used the following drugs: Marijuana. Alcohol:  reports current alcohol use. Tobacco:  reports that she has been smoking cigarettes. She started smoking about 30 years ago. She has a 23 pack-year smoking history. She has never used smokeless tobacco. Medical:  has a past medical history of DVT (deep venous thrombosis) (HCC), GERD (gastroesophageal reflux disease), Hypertension, Polycythemia, Polyneuropathy, and Stroke (HCC). Family: family history includes Breast cancer in her paternal grandmother; COPD in her mother; Cancer in her father and paternal grandmother; Heart disease in her mother; Hemochromatosis in her maternal aunt, maternal aunt, maternal uncle, and mother; Hyperlipidemia in her father and mother; Hypertension in her father and mother; Leukemia in her paternal uncle.  Past Surgical History:  Procedure Laterality Date   COLONOSCOPY N/A 03/16/2019   Procedure: COLONOSCOPY;  Surgeon: Toledo, Boykin Nearing, MD;  Location: [facility] ENDOSCOPY;  Service: Gastroenterology;  Laterality: N/A;   ESOPHAGOGASTRODUODENOSCOPY N/A 03/16/2019   Procedure: ESOPHAGOGASTRODUODENOSCOPY (EGD);  Surgeon: Toledo, Boykin Nearing, MD;  Location: [facility] ENDOSCOPY;  Service: Gastroenterology;  Laterality: N/A;   IR ANGIO INTRA EXTRACRAN SEL COM CAROTID INNOMINATE UNI L MOD SED  02/09/2021   IR INTRAVSC STENT CERV CAROTID W/EMB-PROT MOD SED INCL ANGIO  02/09/2021   IR PERCUTANEOUS ART THROMBECTOMY/INFUSION INTRACRANIAL INC DIAG ANGIO  02/09/2021   IR US GUIDE VASC ACCESS RIGHT  02/09/2021   NO PAST SURGERIES     RADIOLOGY WITH ANESTHESIA N/A 02/09/2021   Procedure: IR WITH ANESTHESIA;  Surgeon: Radiologist, Medication, MD;  [facility];  Service: Radiology;  Laterality: N/A;   RENAL ANGIOGRAPHY Left 01/24/2023   Procedure: RENAL ANGIOGRAPHY;  Surgeon: Annice Needy, MD;  Location: [facility] INVASIVE CV LAB;  Service: Cardiovascular;  Laterality: Left;    Active Ambulatory Problems    Diagnosis Date Noted   Superficial thrombophlebitis 02/12/2019   Thrombocytosis 02/12/2019   GERD (gastroesophageal reflux disease) 02/12/2019   Severe hypertension 02/12/2019   Symptomatic anemia 03/14/2019   Polycythemia 01/23/2021   Leukocytosis 01/23/2021   Erythrocytosis 01/28/2021   Endotracheally intubated 02/09/2021   Macrocytosis without anemia 02/09/2021   COVID-19 virus infection 02/10/2021   Iron deficiency 07/09/2022   History of CVA (cerebrovascular accident) 10/19/2022   Chronic left-sided thoracic back pain 11/25/2022   Dyslipidemia 12/22/2022   NSTEMI (non-ST elevated myocardial infarction) (HCC) 01/22/2023   Electrolyte abnormality 01/22/2023   Hypertensive urgency 01/23/2023   Elevated troponin level 01/23/2023   Smoking 01/23/2023   Renal artery stenosis (HCC) 01/25/2023   Hypertensive emergency 01/25/2023   Atypical chest pain 01/25/2023   Demand ischemia 01/25/2023   Resolved Ambulatory Problems    Diagnosis Date Noted   GI bleed 03/14/2019   UTI (urinary tract infection) 06/05/2019   Acute ischemic right MCA stroke (HCC) 02/09/2021   Past Medical History:  Diagnosis Date   DVT (deep venous thrombosis) (HCC)    Hypertension    Polyneuropathy    Stroke (HCC)    Constitutional Exam  General appearance: Well nourished, well developed, and well hydrated.  In no apparent acute distress There were no vitals filed for this visit. BMI Assessment: Estimated body mass index is 19.88 kg/m as calculated from the following:   Height as of 01/24/23: 5\' 5"  (1.651 m).   Weight as of 01/25/23: 119 lb 7.8 oz (54.2 kg).  BMI interpretation table: BMI level Category Range association with higher incidence of chronic pain  <18 kg/m2 Underweight   18.5-24.9 kg/m2 Ideal body weight   25-29.9 kg/m2 Overweight Increased incidence by 20%  30-34.9 kg/m2 Obese (Class I) Increased incidence by 68%  35-39.9 kg/m2 Severe obesity (Class II)  Increased incidence by 136%  >40 kg/m2 Extreme obesity (Class III) Increased incidence by 254%   Patient's current BMI Ideal Body weight  There is no height or weight on file to calculate BMI. Ideal body weight: 57 kg (125 lb 10.6 oz)   BMI Readings from Last 4 Encounters:  01/25/23 19.88 kg/m  12/22/22 19.97 kg/m  12/10/22 20.30 kg/m  12/08/22 20.47 kg/m   Wt Readings from Last 4 Encounters:  01/25/23 119 lb 7.8 oz (54.2 kg)  12/22/22 120 lb (54.4 kg)  12/10/22 122 lb (55.3 kg)  12/08/22 123 lb (55.8 kg)    Psych/Mental status: Alert, oriented x 3 (person, place, & time)       Eyes: PERLA Respiratory: No evidence of acute respiratory distress  Assessment  Primary Diagnosis & Pertinent Problem List: {There were no encounter diagnoses. (Refresh or delete this SmartLink)}  Visit Diagnosis (New problems to examiner): No diagnosis found. Plan of Care (Initial workup plan)  Note: Ms. Poulos was reminded that as per protocol, today's visit has been an evaluation only. We have not taken over the patient's controlled substance management.  Problem-specific plan: No problem-specific Assessment & Plan notes found for this encounter.  Lab Orders  No laboratory test(s) ordered today   Imaging Orders  No imaging studies ordered today   Referral Orders  No referral(s) requested today   Procedure Orders    No procedure(s) ordered today   Pharmacotherapy (current): Medications ordered:  No orders of the defined types were placed in this encounter.  Medications administered during this visit: Esterlene Simi. Raneri "Marcelino Duster" had no medications administered during this visit.   Analgesic Pharmacotherapy:  Opioid Analgesics: For patients currently taking or requesting to take opioid analgesics, in accordance with [facility] Guidelines, we will assess their risks and indications for the use of these substances. After completing our evaluation, we may offer  recommendations, but we no longer take patients for medication management. The prescribing physician will ultimately decide, based on his/her training and level of comfort whether to adopt any of the recommendations, including whether or not to prescribe such medicines.  Membrane stabilizer: To be determined at a later time  Muscle relaxant: To be determined at a later time  NSAID: To be determined at a later time  Other analgesic(s): To be determined at a later time   Interventional management options: Ms. Nesseth was informed that there is no guarantee that she would be a candidate for interventional therapies. The decision will be based on the results of diagnostic studies, as well as Ms. Peeters's risk profile.  Procedure(s) under consideration:  Pending results of ordered studies      Interventional Therapies  Risk Factors  Considerations  Medical Comorbidities:     Planned  Pending:      Under consideration:   Pending   Completed:   None at this time   Therapeutic  Palliative (PRN) options:   None established   Completed by other providers:   None reported       Provider-requested follow-up: No follow-ups on file.  Future Appointments  Date Time Provider Department Center  02/02/2023  9:00 AM Delano Metz, MD [facility] None  02/02/2023 10:15 AM [facility]  02/08/2023  9:40 AM Smitty Cords, DO [facility]  02/18/2023  2:20 PM Dunn, Raymon Mutton, PA-C [facility] None  05/24/2023  9:45 AM [facility] None  11/22/2023  9:00 AM [facility] None  11/22/2023  9:30 AM Creig Hines, MD [facility] None    Duration of encounter: *** minutes.  Total time on encounter, as per AMA guidelines included both the face-to-face and non-face-to-face time personally spent by the physician and/or other qualified health care professional(s) on the day of the encounter (includes time in activities that require the physician or other qualified  health care professional and does not include time in activities normally performed by clinical staff). Physician's time may include the following activities when performed: Preparing to see the patient (e.g., pre-charting review of records, searching for previously ordered imaging, lab work, and nerve conduction tests) Review of prior analgesic pharmacotherapies. Reviewing PMP Interpreting ordered tests (e.g., lab work, imaging, nerve conduction tests) Performing post-procedure evaluations, including interpretation of diagnostic procedures Obtaining and/or reviewing separately obtained history Performing a medically appropriate examination and/or evaluation Counseling and educating the patient/family/caregiver Ordering medications, tests, or procedures Referring and communicating with other health care professionals (when not separately reported) Documenting clinical information in the electronic or other health record Independently interpreting results (not separately reported) and communicating results to the patient/ family/caregiver Care coordination (not separately reported)  Note by: Oswaldo Done, MD (TTS technology used. I apologize for any typographical errors that were not detected and corrected.) Date: 02/02/2023; Time: 7:59 AM

## 2023-02-01 NOTE — Telephone Encounter (Signed)
Attempted to call patient regarding upcoming cardiac CT appointment. °Left message on voicemail with name and callback number °Sara [name] RN Navigator Cardiac Imaging °[facility] °[phone] Office °[phone] Cell ° °

## 2023-02-02 ENCOUNTER — Ambulatory Visit (HOSPITAL_BASED_OUTPATIENT_CLINIC_OR_DEPARTMENT_OTHER): Payer: 59 | Admitting: Pain Medicine

## 2023-02-02 ENCOUNTER — Ambulatory Visit
Admission: RE | Admit: 2023-02-02 | Discharge: 2023-02-02 | Disposition: A | Discharge status: Home / Self Care | Payer: 59 | Source: Ambulatory Visit | Attending: Cardiology | Admitting: Cardiology

## 2023-02-02 ENCOUNTER — Telehealth: Payer: Self-pay | Admitting: Pain Medicine

## 2023-02-02 ENCOUNTER — Telehealth: Payer: Self-pay

## 2023-02-02 ENCOUNTER — Encounter: Payer: Self-pay | Admitting: Pain Medicine

## 2023-02-02 VITALS — BP 136/95 | HR 73 | Temp 98.2°F | Resp 18 | Ht 62.0 in | Wt 121.0 lb

## 2023-02-02 DIAGNOSIS — M546 Pain in thoracic spine: Secondary | ICD-10-CM | POA: Insufficient documentation

## 2023-02-02 DIAGNOSIS — G894 Chronic pain syndrome: Secondary | ICD-10-CM

## 2023-02-02 DIAGNOSIS — G8929 Other chronic pain: Secondary | ICD-10-CM | POA: Diagnosis present

## 2023-02-02 DIAGNOSIS — Z79899 Other long term (current) drug therapy: Secondary | ICD-10-CM | POA: Diagnosis not present

## 2023-02-02 DIAGNOSIS — M5030 Other cervical disc degeneration, unspecified cervical region: Secondary | ICD-10-CM | POA: Diagnosis not present

## 2023-02-02 DIAGNOSIS — Z7901 Long term (current) use of anticoagulants: Secondary | ICD-10-CM | POA: Diagnosis not present

## 2023-02-02 DIAGNOSIS — M5124 Other intervertebral disc displacement, thoracic region: Secondary | ICD-10-CM | POA: Insufficient documentation

## 2023-02-02 DIAGNOSIS — M899 Disorder of bone, unspecified: Secondary | ICD-10-CM | POA: Insufficient documentation

## 2023-02-02 DIAGNOSIS — Z789 Other specified health status: Secondary | ICD-10-CM | POA: Diagnosis present

## 2023-02-02 DIAGNOSIS — M5134 Other intervertebral disc degeneration, thoracic region: Secondary | ICD-10-CM | POA: Diagnosis not present

## 2023-02-02 DIAGNOSIS — R079 Chest pain, unspecified: Secondary | ICD-10-CM | POA: Diagnosis not present

## 2023-02-02 MED ORDER — METOPROLOL TARTRATE 5 MG/5ML IV SOLN
10.0000 mg | Freq: Once | INTRAVENOUS | Status: AC
  Administered 2023-02-02: 2.5 mg via INTRAVENOUS
  Filled 2023-02-02: qty 10

## 2023-02-02 MED ORDER — METOPROLOL TARTRATE 5 MG/5ML IV SOLN
INTRAVENOUS | Status: AC
  Filled 2023-02-02: qty 10

## 2023-02-02 MED ORDER — NITROGLYCERIN 0.4 MG SL SUBL
0.4000 mg | SUBLINGUAL_TABLET | SUBLINGUAL | Status: DC | PRN
  Filled 2023-02-02: qty 25

## 2023-02-02 MED ORDER — IOHEXOL 350 MG/ML SOLN
80.0000 mL | Freq: Once | INTRAVENOUS | Status: AC | PRN
  Administered 2023-02-02: 80 mL via INTRAVENOUS

## 2023-02-02 MED ORDER — NITROGLYCERIN 0.4 MG SL SUBL
0.8000 mg | SUBLINGUAL_TABLET | Freq: Once | SUBLINGUAL | Status: AC
  Administered 2023-02-02: 0.8 mg via SUBLINGUAL
  Filled 2023-02-02: qty 25

## 2023-02-02 NOTE — Telephone Encounter (Signed)
Dont schedule procedure until we get clearance please.

## 2023-02-02 NOTE — Telephone Encounter (Signed)
Dr Laban Emperor would like clearance to stop Plavix for 7 days for a Thoracic Epidural Steroid injection.  Thank you

## 2023-02-02 NOTE — Telephone Encounter (Signed)
Please notify Dr Waynetta Sandy office - [place] to HOLD Plavix for 7 days prior to Thoracic ESI  [name], DO [facility] 02/02/2023, 9:58 AM

## 2023-02-02 NOTE — Telephone Encounter (Signed)
Called Dr.Naveira's office unable to get a hold with a nurse. Spoke to Capron- she will notify a nurse to give me a call back.

## 2023-02-02 NOTE — Progress Notes (Signed)
Pt tolerated procedure well with no issues. Pt ABCs intact. Pt denies any complaints. Pt encouraged to drink plenty of water throughout the day. Pt ambulatory with steady gait.  

## 2023-02-02 NOTE — Telephone Encounter (Signed)
Joanna Garcia returned call about blood thinner. Please call her back   [phone]

## 2023-02-02 NOTE — Progress Notes (Signed)
Coronary calcium score of 131.  Mild nonobstructive coronary artery disease was noted. Recommendation was to consider none cardiac causes of chest pain.  Also to continue with preventative therapy (cholesterol medication and aspirin therapy) and risk factor modification with medications, dietary, and lifestyle changes of increased activity and to stop smoking.

## 2023-02-02 NOTE — Progress Notes (Signed)
Safety precautions to be maintained throughout the outpatient stay will include: orient to surroundings, keep bed in low position, maintain call bell within reach at all times, provide assistance with transfer out of bed and ambulation.  

## 2023-02-03 ENCOUNTER — Encounter: Payer: 59 | Admitting: Physical Therapy

## 2023-02-03 LAB — C-REACTIVE PROTEIN: CRP: <1 mg/L (ref 0–10)

## 2023-02-03 LAB — SEDIMENTATION RATE: Sed Rate: 2 mm/h (ref 0–32)

## 2023-02-04 LAB — COMPLIANCE DRUG ANALYSIS, UR

## 2023-02-08 ENCOUNTER — Encounter: Payer: 59 | Admitting: Physical Therapy

## 2023-02-08 ENCOUNTER — Ambulatory Visit (INDEPENDENT_AMBULATORY_CARE_PROVIDER_SITE_OTHER): Payer: 59 | Admitting: Family Medicine

## 2023-02-08 ENCOUNTER — Encounter: Payer: Self-pay | Admitting: Family Medicine

## 2023-02-08 VITALS — BP 152/78 | HR 98 | Ht 62.0 in | Wt 122.0 lb

## 2023-02-08 DIAGNOSIS — G8929 Other chronic pain: Secondary | ICD-10-CM | POA: Diagnosis not present

## 2023-02-08 DIAGNOSIS — I10 Essential (primary) hypertension: Secondary | ICD-10-CM | POA: Diagnosis not present

## 2023-02-08 DIAGNOSIS — I6930 Unspecified sequelae of cerebral infarction: Secondary | ICD-10-CM | POA: Diagnosis not present

## 2023-02-08 DIAGNOSIS — M546 Pain in thoracic spine: Secondary | ICD-10-CM

## 2023-02-08 MED ORDER — GABAPENTIN 300 MG PO CAPS
ORAL_CAPSULE | ORAL | 5 refills | Status: DC
Start: 2023-02-08 — End: 2023-08-09

## 2023-02-08 MED ORDER — CLOPIDOGREL BISULFATE 75 MG PO TABS
75.0000 mg | ORAL_TABLET | Freq: Every day | ORAL | 2 refills | Status: DC
Start: 2023-02-08 — End: 2023-05-31

## 2023-02-08 MED ORDER — LOSARTAN POTASSIUM 50 MG PO TABS
50.0000 mg | ORAL_TABLET | Freq: Every day | ORAL | 2 refills | Status: DC
Start: 2023-02-08 — End: 2023-04-20

## 2023-02-08 MED ORDER — AMLODIPINE BESYLATE 5 MG PO TABS
5.0000 mg | ORAL_TABLET | Freq: Every day | ORAL | 1 refills | Status: DC
Start: 2023-02-08 — End: 2023-08-25

## 2023-02-08 NOTE — Progress Notes (Addendum)
Subjective:    Patient ID: Joanna Garcia, female    DOB: November 29, 1975, 47 y.o.   MRN: [id number]  Joanna Garcia is a 47 y.o. female presenting on 02/08/2023 for Hypertension   HPI  HOSPITAL FOLLOW-UP VISIT  Hospital/Location: [facility] Date of Admission: 01/22/23 Date of Discharge: 01/25/23 Transitions of care telephone call: Not completed.  Reason for Admission: Hypertensive Emergency , demand ischemia ruled out NSTEMI  - Hospital H&P and Discharge Summary have been reviewed - Patient presents today 2 weeks after recent hospitalization. Brief summary of recent course, patient had symptoms of elevated BP 190-200+, she had established with me earlier in 2024. She was not on regimen anymore for BP, and was on Lisinopril-hydrochlorothiazide previously but off of those meds, we restarted hydrochlorothiazide and she did well for 1 month. They did renal artery duplex showed Left renal artery stenosis, s/p stent placement, and she was switched to Amlodipine and Losartan, and OFF thiazide.  Left sided headaches, can raise BP. Or asking if BP is causing headache.  She has had elevated BP still since discharge  Awaiting follow up apts.  - Today reports overall has done well after discharge. Symptoms have improved  - New medications on discharge: Losartan Amlodipine - Changes to current meds on discharge: hydrochlorothiazide   I have reviewed the discharge medication list, and have reconciled the current and discharge medications today.   Current Outpatient Medications:    aspirin 81 MG chewable tablet, Chew and swallow 1 tablet (81 mg total) by mouth daily., Disp: 30 tablet, Rfl: 2   atorvastatin (LIPITOR) 80 MG tablet, Take 1 tablet (80 mg total) by mouth daily., Disp: 90 tablet, Rfl: 1   meloxicam (MOBIC) 15 MG tablet, TAKE ONE TABLET DAILY AS NEEDED., Disp: 30 tablet, Rfl: 2   nicotine (NICODERM CQ - DOSED IN MG/24 HOURS) 14 mg/24hr patch, Place 1 patch (14 mg total) onto the skin daily.,  Disp: 28 patch, Rfl: 0   ondansetron (ZOFRAN-ODT) 4 MG disintegrating tablet, Take 1 tablet (4 mg total) by mouth every 8 (eight) hours as needed for nausea or vomiting., Disp: 30 tablet, Rfl: 2   pantoprazole (PROTONIX) 40 MG tablet, Take 1 tablet (40 mg total) by mouth daily before breakfast., Disp: 90 tablet, Rfl: 3   tiZANidine (ZANAFLEX) 4 MG tablet, Take 1 tablet (4 mg total) by mouth every 8 (eight) hours as needed for muscle spasms., Disp: 90 tablet, Rfl: 2   amLODipine (NORVASC) 5 MG tablet, Take 1 tablet (5 mg total) by mouth daily., Disp: 90 tablet, Rfl: 1   clopidogrel (PLAVIX) 75 MG tablet, Take 1 tablet (75 mg total) by mouth daily., Disp: 30 tablet, Rfl: 2   gabapentin (NEURONTIN) 300 MG capsule, Take 3 caps = 900mg  dose in morning and afternoon and take 4 caps = 1200mg  in evening. Max dose is 10 capsules in 24 hours., Disp: 300 capsule, Rfl: 5   losartan (COZAAR) 50 MG tablet, Take 1 tablet (50 mg total) by mouth daily., Disp: 30 tablet, Rfl: 2   Past Surgical History:  Procedure Laterality Date   COLONOSCOPY N/A 03/16/2019   Procedure: COLONOSCOPY;  Surgeon: Toledo, Boykin Nearing, MD;  Location: [facility];  Service: Gastroenterology;  Laterality: N/A;   ESOPHAGOGASTRODUODENOSCOPY N/A 03/16/2019   Procedure: ESOPHAGOGASTRODUODENOSCOPY (EGD);  Surgeon: Toledo, Boykin Nearing, MD;  Location: [facility];  Service: Gastroenterology;  Laterality: N/A;   IR ANGIO INTRA EXTRACRAN SEL COM CAROTID INNOMINATE UNI L MOD SED  02/09/2021   IR INTRAVSC  STENT CERV CAROTID W/EMB-PROT MOD SED INCL ANGIO  02/09/2021   IR PERCUTANEOUS ART THROMBECTOMY/INFUSION INTRACRANIAL INC DIAG ANGIO  02/09/2021   IR US GUIDE VASC ACCESS RIGHT  02/09/2021   NO PAST SURGERIES     RADIOLOGY WITH ANESTHESIA N/A 02/09/2021   Procedure: IR WITH ANESTHESIA;  Surgeon: Radiologist, Medication, MD;  [facility];  Service: Radiology;  Laterality: N/A;   RENAL ANGIOGRAPHY Left 01/24/2023   Procedure: RENAL ANGIOGRAPHY;   Surgeon: Annice Needy, MD;  Location: [facility] INVASIVE CV LAB;  Service: Cardiovascular;  Laterality: Left;    ------------------------------------------------------------------------- Social History   Tobacco Use   Smoking status: Every Day    Current packs/day: 0.75    Average packs/day: 0.7 packs/day for 30.6 years (23.0 ttl pk-yrs)    Types: Cigarettes    Start date: 06/21/1992   Smokeless tobacco: Never  Vaping Use   Vaping status: Never Used  Substance Use Topics   Alcohol use: Yes    Comment: 24 Oz daily   Drug use: Not Currently    Types: Marijuana    Review of Systems Per HPI unless specifically indicated above     Objective:    BP (!) 152/78 (BP Location: Left Arm, Cuff Size: Normal)   Pulse 98   Ht 5\' 2"  (1.575 m)   Wt 122 lb (55.3 kg)   SpO2 97%   BMI 22.31 kg/m   Wt Readings from Last 3 Encounters:  02/08/23 122 lb (55.3 kg)  02/02/23 121 lb (54.9 kg)  01/25/23 119 lb 7.8 oz (54.2 kg)    Physical Exam Vitals and nursing note reviewed.  Constitutional:      General: She is not in acute distress.    Appearance: She is well-developed. She is not diaphoretic.     Comments: Well-appearing, comfortable, cooperative  HENT:     Head: Normocephalic and atraumatic.  Eyes:     General:        Right eye: No discharge.        Left eye: No discharge.     Conjunctiva/sclera: Conjunctivae normal.  Neck:     Thyroid: No thyromegaly.  Cardiovascular:     Rate and Rhythm: Normal rate and regular rhythm.     Heart sounds: Normal heart sounds. No murmur heard. Pulmonary:     Effort: Pulmonary effort is normal. No respiratory distress.     Breath sounds: Normal breath sounds. No wheezing or rales.  Musculoskeletal:        General: Normal range of motion.     Cervical back: Normal range of motion and neck supple.  Lymphadenopathy:     Cervical: No cervical adenopathy.  Skin:    General: Skin is warm and dry.     Findings: Bruising (lower extremity from access site  for stent) present. No erythema or rash.  Neurological:     Mental Status: She is alert and oriented to person, place, and time.  Psychiatric:        Behavior: Behavior normal.     Comments: Well groomed, good eye contact, normal speech and thoughts     I have personally reviewed the radiology report from 02/02/23 on Coronary CT.  CT CORONARY MORPH W/CTA COR W/SCORE W/CA W/CM &/OR WO/CM [[id number]] Resulted: 02/02/23 1239  Order Status: Completed Updated: 02/02/23 1242  Narrative:    CLINICAL DATA:  Chest pain  EXAM: Cardiac/Coronary  CTA  TECHNIQUE: The patient was scanned on a Siemens Somatom go.Top scanner.  : A retrospective scan  was triggered in the ascending thoracic aorta. Axial non-contrast 3 mm slices were carried out through the heart. The data set was analyzed on a dedicated work station and scored using the Agatson method. Gantry rotation speed was 66 msecs and collimation was .6 mm. 100mg  of metoprolol and 0.8 mg of sl NTG was given. The 3D data set was reconstructed in 5% intervals of the 60-95 % of the R-R cycle. Diastolic phases were analyzed on a dedicated work station using MPR, MIP and VRT modes. The patient received 80 cc of contrast.  FINDINGS: Aorta: Normal size. Mild aortic wall calcifications. No dissection.  Aortic Valve:  Trileaflet.  No calcifications.  Coronary Arteries:  Normal coronary origin.  Right dominance.  RCA is a dominant artery. There is calcified plaque proximally causing mild stenosis (25-49%).  Left main gives rise to LAD and LCX arteries. LM has no disease.  LAD has calcified plaque proximally causing minimal stenosis (<25%).  LCX is a non-dominant artery. There is calcified plaque proximally causing minimal stenosis (<25%).  Other findings:  Normal pulmonary vein drainage into the left atrium.  Normal left atrial appendage without a thrombus.  Normal size of the pulmonary artery.  IMPRESSION: 1. Coronary calcium  score of 131. This was 99th percentile for age and sex matched control.  2. Normal coronary origin with right dominance.  3. Mild proximal RCA stenosis (25-49%).  4. Minimal LAD and LCx stenosis (<25%).  5. CAD-RADS 2. Mild non-obstructive CAD (25-49%). Consider non-atherosclerotic causes of chest pain. Consider preventive therapy and risk factor modification.   Electronically Signed   By: Debbe Odea M.D.   On: 02/02/2023 12:39    Results for orders placed or performed in visit on 02/02/23  Compliance Drug Analysis, Ur  Result Value Ref Range   Summary Note   Sedimentation rate  Result Value Ref Range   Sed Rate 2 0 - 32 mm/hr  C-reactive protein  Result Value Ref Range   CRP <1 0 - 10 mg/L      Assessment & Plan:   Problem List Items Addressed This Visit     Chronic thoracic back pain (Left) (Chronic)   Relevant Medications   gabapentin (NEURONTIN) 300 MG capsule   Other Visit Diagnoses     Essential hypertension    -  Primary   Relevant Medications   losartan (COZAAR) 50 MG tablet   amLODipine (NORVASC) 5 MG tablet   History of cerebrovascular accident (CVA) with residual deficit       Relevant Medications   clopidogrel (PLAVIX) 75 MG tablet       HYPERTENSION With secondary RAS Improved, but not yet at goal Remain off hydrochlorothiazide She has required ACEi ARB due to renal artery stenosis and s/p stent  Dose increase Losartan 25 up to 50mg  daily Keep on Amlodipine 5mg  daily, may adjust in future. But caution she has edema.  New orders sent. With refills.  Next apt with Cardiology 02/18/23 at 2:20pm Eula Listen PA  Call Dr Wyn Quaker and check in with them on follow up recommendations.  VASCULAR SURGERY  S/p Renal artery stent procedure, has mild hematoma and ecchymosis from site  [facility] [address] [place],  [place]  [address] Main: [phone]  Meds ordered this encounter  Medications    losartan (COZAAR) 50 MG tablet    Sig: Take 1 tablet (50 mg total) by mouth daily.    Dispense:  30 tablet  Refill:  2   amLODipine (NORVASC) 5 MG tablet    Sig: Take 1 tablet (5 mg total) by mouth daily.    Dispense:  90 tablet    Refill:  1   clopidogrel (PLAVIX) 75 MG tablet    Sig: Take 1 tablet (75 mg total) by mouth daily.    Dispense:  30 tablet    Refill:  2    Switched from Brilinta (not covered)   gabapentin (NEURONTIN) 300 MG capsule    Sig: Take 3 caps = 900mg  dose in morning and afternoon and take 4 caps = 1200mg  in evening. Max dose is 10 capsules in 24 hours.    Dispense:  300 capsule    Refill:  5    Follow up plan: Return if symptoms worsen or fail to improve.  [name], DO [facility] 02/08/2023, 10:27 AM

## 2023-02-08 NOTE — Patient Instructions (Addendum)
Thank you for coming to the office today.  Dose increase Losartan 25 up to 50mg  daily Keep on Amlodipine 5mg  daily  New orders sent. With refills.  Next apt with Cardiology 02/18/23 at 2:20pm Eula Listen PA  Call Dr Wyn Quaker and check in with them on follow up recommendations.  VASCULAR SURGERY  [facility] [address] [place],  [place]  [address] Main: [phone]  Please schedule a Follow-up Appointment to: Return if symptoms worsen or fail to improve.  If you have any other questions or concerns, please feel free to call the office or send a message through MyChart. You may also schedule an earlier appointment if necessary.  Additionally, you may be receiving a survey about your experience at our office within a few days to 1 week by e-mail or mail. We value your feedback.  [name], DO [facility], [place]

## 2023-02-09 DIAGNOSIS — H5213 Myopia, bilateral: Secondary | ICD-10-CM | POA: Diagnosis not present

## 2023-02-10 ENCOUNTER — Encounter: Payer: 59 | Admitting: Physical Therapy

## 2023-02-15 ENCOUNTER — Encounter: Payer: 59 | Admitting: Physical Therapy

## 2023-02-17 ENCOUNTER — Encounter: Payer: 59 | Admitting: Physical Therapy

## 2023-02-18 ENCOUNTER — Telehealth: Payer: Self-pay | Admitting: Pain Medicine

## 2023-02-18 ENCOUNTER — Ambulatory Visit: Payer: 59 | Admitting: Physician Assistant

## 2023-02-18 NOTE — Telephone Encounter (Signed)
Wants someone to call her with instructions for upcoming injections with Dr Laban Emperor

## 2023-02-18 NOTE — Telephone Encounter (Signed)
Called patient with pre op instructions. Patient with udnerstanding.

## 2023-02-22 ENCOUNTER — Encounter: Payer: 59 | Admitting: Physical Therapy

## 2023-02-24 ENCOUNTER — Ambulatory Visit: Payer: 59 | Attending: Pain Medicine | Admitting: Pain Medicine

## 2023-02-24 ENCOUNTER — Ambulatory Visit
Admission: RE | Admit: 2023-02-24 | Discharge: 2023-02-24 | Disposition: A | Discharge status: Home / Self Care | Payer: 59 | Source: Ambulatory Visit | Attending: Pain Medicine | Admitting: Pain Medicine

## 2023-02-24 ENCOUNTER — Encounter: Payer: Self-pay | Admitting: Pain Medicine

## 2023-02-24 ENCOUNTER — Encounter: Payer: 59 | Admitting: Physical Therapy

## 2023-02-24 VITALS — BP 140/82 | HR 101 | Temp 97.0°F | Resp 16 | Ht 62.0 in | Wt 117.0 lb

## 2023-02-24 DIAGNOSIS — Z7902 Long term (current) use of antithrombotics/antiplatelets: Secondary | ICD-10-CM | POA: Diagnosis not present

## 2023-02-24 DIAGNOSIS — R937 Abnormal findings on diagnostic imaging of other parts of musculoskeletal system: Secondary | ICD-10-CM | POA: Insufficient documentation

## 2023-02-24 DIAGNOSIS — G8929 Other chronic pain: Secondary | ICD-10-CM | POA: Insufficient documentation

## 2023-02-24 DIAGNOSIS — M5134 Other intervertebral disc degeneration, thoracic region: Secondary | ICD-10-CM | POA: Insufficient documentation

## 2023-02-24 DIAGNOSIS — M546 Pain in thoracic spine: Secondary | ICD-10-CM

## 2023-02-24 DIAGNOSIS — M5124 Other intervertebral disc displacement, thoracic region: Secondary | ICD-10-CM | POA: Diagnosis not present

## 2023-02-24 DIAGNOSIS — Z7901 Long term (current) use of anticoagulants: Secondary | ICD-10-CM

## 2023-02-24 MED ORDER — FENTANYL CITRATE (PF) 100 MCG/2ML IJ SOLN
INTRAMUSCULAR | Status: AC
  Filled 2023-02-24: qty 2

## 2023-02-24 MED ORDER — DEXAMETHASONE SODIUM PHOSPHATE 10 MG/ML IJ SOLN
10.0000 mg | Freq: Once | INTRAMUSCULAR | Status: AC
  Administered 2023-02-24: 10 mg

## 2023-02-24 MED ORDER — LACTATED RINGERS IV SOLN: Freq: Once | INTRAVENOUS | Status: AC

## 2023-02-24 MED ORDER — IOHEXOL 180 MG/ML  SOLN
INTRAMUSCULAR | Status: AC
  Filled 2023-02-24: qty 10

## 2023-02-24 MED ORDER — IOHEXOL 180 MG/ML  SOLN
10.0000 mL | Freq: Once | INTRAMUSCULAR | Status: AC
  Administered 2023-02-24: 10 mL via EPIDURAL

## 2023-02-24 MED ORDER — ROPIVACAINE HCL 2 MG/ML IJ SOLN
2.0000 mL | Freq: Once | INTRAMUSCULAR | Status: AC
  Administered 2023-02-24: 2 mL via EPIDURAL

## 2023-02-24 MED ORDER — SODIUM CHLORIDE (PF) 0.9 % IJ SOLN
INTRAMUSCULAR | Status: AC
  Filled 2023-02-24: qty 10

## 2023-02-24 MED ORDER — SODIUM CHLORIDE 0.9% FLUSH
2.0000 mL | Freq: Once | INTRAVENOUS | Status: AC
  Administered 2023-02-24: 2 mL

## 2023-02-24 MED ORDER — LIDOCAINE HCL (PF) 2 % IJ SOLN
INTRAMUSCULAR | Status: AC
  Filled 2023-02-24: qty 10

## 2023-02-24 MED ORDER — LIDOCAINE HCL 2 % IJ SOLN
20.0000 mL | Freq: Once | INTRAMUSCULAR | Status: AC
  Administered 2023-02-24: 200 mg

## 2023-02-24 MED ORDER — FENTANYL CITRATE (PF) 100 MCG/2ML IJ SOLN
25.0000 ug | INTRAMUSCULAR | Status: DC | PRN
  Administered 2023-02-24: 50 ug via INTRAVENOUS

## 2023-02-24 MED ORDER — DEXAMETHASONE SODIUM PHOSPHATE 10 MG/ML IJ SOLN
INTRAMUSCULAR | Status: AC
  Filled 2023-02-24: qty 1

## 2023-02-24 MED ORDER — MIDAZOLAM HCL 5 MG/5ML IJ SOLN
0.5000 mg | Freq: Once | INTRAMUSCULAR | Status: AC
  Administered 2023-02-24: 3 mg via INTRAVENOUS

## 2023-02-24 MED ORDER — PENTAFLUOROPROP-TETRAFLUOROETH EX AERO
INHALATION_SPRAY | Freq: Once | CUTANEOUS | Status: AC
  Administered 2023-02-24: 30 via TOPICAL

## 2023-02-24 MED ORDER — MIDAZOLAM HCL 5 MG/5ML IJ SOLN
INTRAMUSCULAR | Status: AC
  Filled 2023-02-24: qty 5

## 2023-02-24 MED ORDER — ROPIVACAINE HCL 2 MG/ML IJ SOLN
INTRAMUSCULAR | Status: AC
  Filled 2023-02-24: qty 20

## 2023-02-24 NOTE — Progress Notes (Signed)
PROVIDER NOTE: Interpretation of information contained herein should be left to medically-trained personnel. Specific patient instructions are provided elsewhere under "Patient Instructions" section of medical record. This document was created in part using STT-dictation technology, any transcriptional errors that may result from this process are unintentional.  Patient: Joanna Garcia Type: Established DOB: Mar 30, 1976 MRN: [id number] PCP: Joanna Cords, DO  Service: Procedure DOS: 02/24/2023 Setting: Ambulatory Location: Ambulatory outpatient facility Delivery: Face-to-face Provider: Oswaldo Done, MD Specialty: Interventional Pain Management Specialty designation: 09 Location: Outpatient facility Ref. Prov.: Joanna Garcia *       Interventional Therapy   Primary Reason for Visit: Interventional Pain Management Treatment. CC: Back Pain (mid)  Procedure:           Inter-Laminar Thoracic Epidural Steroid Block/Injection  #1  Laterality:  Left Level: T6-7  Imaging: Fluoroscopic guidance Anesthesia: Local anesthesia (1-2% Lidocaine) Anxiolysis: IV Versed 3 mg Sedation: Moderate conscious sedation. Fentanyl 1cc (50 mcg) DOS: 02/24/2023 Performed by: Joanna Done, MD  Purpose: Diagnostic/Therapeutic Indications: Thoracic back pain, radicular pain, with degenerative disc disease severe enough to impact quality of life or function. 1. Chronic thoracic back pain (Left)   2. 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 DDD (degenerative disc disease), thoracic   4. Chronic anticoagulation (Plavix)    NAS-11 Pain score:   Pre-procedure: 7 /10   Post-procedure: 0-No pain/10     Position / Prep / Materials:  Position: Prone  Prep solution: DuraPrep (Iodine Povacrylex [0.7% available iodine] and Isopropyl Alcohol, 74% w/w) Prep Area: Posterior Thoracolumbar (Upper back from shoulders to lower lumbar region).  Materials:   Tray: Epidural Needle(s) Type: Epidural needle Gauge (G): 17 Length: Regular (3.5-in) Qty: 1  H&P (Pre-op Assessment):  Joanna Garcia is a 47 y.o. (year old), female patient, seen today for interventional treatment. She  has a past surgical history that includes No past surgeries; Esophagogastroduodenoscopy (N/A, 03/16/2019); Colonoscopy (N/A, 03/16/2019); Radiology with anesthesia (N/A, 02/09/2021); IR ANGIO INTRA EXTRACRAN SEL COM CAROTID INNOMINATE UNI L MOD SED (02/09/2021); IR INTRAVSC STENT CERV CAROTID W/EMB-PROT MOD SED (02/09/2021); IR PERCUTANEOUS ART THROMBECTOMY/INFUSION INTRACRANIAL INC DIAG ANGIO (02/09/2021); IR US Guide Vasc Access Right (02/09/2021); and RENAL ANGIOGRAPHY (Left, 01/24/2023). Joanna Garcia has a current medication list which includes the following prescription(s): amlodipine, aspirin, atorvastatin, clopidogrel, gabapentin, losartan, meloxicam, ondansetron, pantoprazole, and tizanidine, and the following Facility-Administered Medications: fentanyl. Her primarily concern today is the Back Pain (mid)  Initial Vital Signs:  Pulse/HCG Rate: (!) 101ECG Heart Rate: 85 Temp: (!) 97.2 F (36.2 C) Resp: 16 BP: (!) 142/88 SpO2: 100 %  BMI: Estimated body mass index is 21.4 kg/m as calculated from the following:   Height as of this encounter: 5\' 2"  (1.575 m).   Weight as of this encounter: 117 lb (53.1 kg).  Risk Assessment: Allergies: Reviewed. She has No Known Allergies.  Allergy Precautions: None required Coagulopathies: Reviewed. None identified.  Blood-thinner therapy: None at this time Active Infection(s): Reviewed. None identified. Joanna Garcia is afebrile  Site Confirmation: Joanna Garcia was asked to confirm the procedure and laterality before marking the site Procedure checklist: Completed Consent: Before the procedure and under the influence of no sedative(s), amnesic(s), or anxiolytics, the patient was informed of the treatment options, risks and possible  complications. To fulfill our ethical and legal obligations, as recommended by the American Medical Association's Code of Ethics, I have informed the patient of my clinical impression; the nature and purpose of the treatment or  procedure; the risks, benefits, and possible complications of the intervention; the alternatives, including doing nothing; the risk(s) and benefit(s) of the alternative treatment(s) or procedure(s); and the risk(s) and benefit(s) of doing nothing. The patient was provided information about the general risks and possible complications associated with the procedure. These may include, but are not limited to: failure to achieve desired goals, infection, bleeding, organ or nerve damage, allergic reactions, paralysis, and death. In addition, the patient was informed of those risks and complications associated to Spine-related procedures, such as failure to decrease pain; infection (i.e.: Meningitis, epidural or intraspinal abscess); bleeding (i.e.: epidural hematoma, subarachnoid hemorrhage, or any other type of intraspinal or peri-dural bleeding); organ or nerve damage (i.e.: Any type of peripheral nerve, nerve root, or spinal cord injury) with subsequent damage to sensory, motor, and/or autonomic systems, resulting in permanent pain, numbness, and/or weakness of one or several areas of the body; allergic reactions; (i.e.: anaphylactic reaction); and/or death. Furthermore, the patient was informed of those risks and complications associated with the medications. These include, but are not limited to: allergic reactions (i.e.: anaphylactic or anaphylactoid reaction(s)); adrenal axis suppression; blood sugar elevation that in diabetics may result in ketoacidosis or comma; water retention that in patients with history of congestive heart failure may result in shortness of breath, pulmonary edema, and decompensation with resultant heart failure; weight gain; swelling or edema; medication-induced  neural toxicity; particulate matter embolism and blood vessel occlusion with resultant organ, and/or nervous system infarction; and/or aseptic necrosis of one or more joints. Finally, the patient was informed that Medicine is not an exact science; therefore, there is also the possibility of unforeseen or unpredictable risks and/or possible complications that may result in a catastrophic outcome. The patient indicated having understood very clearly. We have given the patient no guarantees and we have made no promises. Enough time was given to the patient to ask questions, all of which were answered to the patient's satisfaction. Ms. Dyer has indicated that she wanted to continue with the procedure. Attestation: I, the ordering provider, attest that I have discussed with the patient the benefits, risks, side-effects, alternatives, likelihood of achieving goals, and potential problems during recovery for the procedure that I have provided informed consent. Date  Time: 02/24/2023  9:10 AM   Pre-Procedure Preparation:  Monitoring: As per clinic protocol. Respiration, ETCO2, SpO2, BP, heart rate and rhythm monitor placed and checked for adequate function Safety Precautions: Patient was assessed for positional comfort and pressure points before starting the procedure. Time-out: I initiated and conducted the "Time-out" before starting the procedure, as per protocol. The patient was asked to participate by confirming the accuracy of the "Time Out" information. Verification of the correct person, site, and procedure were performed and confirmed by me, the nursing staff, and the patient. "Time-out" conducted as per Joint Commission's Universal Protocol (UP.01.01.01). Time: 1103 Start Time: 1104 hrs.  Description of Procedure:          Target Area: For Epidural Steroid injection(s), the target area is the  interlaminar space, initially targeting the lower border of the superior vertebral body lamina. Approach:  Interlaminar approach. Area Prepped: Entire Posterior Thoracolumbar Region DuraPrep (Iodine Povacrylex [0.7% available iodine] and Isopropyl Alcohol, 74% w/w) Safety Precautions: Aspiration looking for blood return was conducted prior to all injections. At no point did we inject any substances, as a needle was being advanced. No attempts were made at seeking any paresthesias. Safe injection practices and needle disposal techniques used. Medications properly checked for expiration  dates. SDV (single dose vial) medications used. Description of the Procedure: Protocol guidelines were followed. The patient was placed in position over the fluoroscopy table. The target area was identified and the area prepped in the usual manner. Skin & deeper tissues infiltrated with local anesthetic. Appropriate amount of time allowed to pass for local anesthetics to take effect. The procedure needles were then advanced to the target area. The inferior aspect of the superior lamina was contacted and the needle walked caudad, until the lamina was cleared. The epidural space was identified using "loss-of-resistance technique" with 0.9% PF-NSS (2-63mL), in a low friction 10cc LOR glass syringe. Proper needle placement was secured. Negative aspiration confirmed. Solution injected in intermittent fashion, asking for systemic symptoms every 0.5 cc of injectate. The needles were then removed and the area cleansed, making sure to leave some of the prepping solution behind to take advantage of its long term bactericidal properties.  Vitals:   02/24/23 1115 02/24/23 1124 02/24/23 1134 02/24/23 1144  BP: 132/70 (!) 133/57 123/61 (!) 140/82  Pulse:      Resp: (!) 22 20 16 16   Temp:  (!) 97 F (36.1 C)    TempSrc:      SpO2: 100% 96% 97% 100%  Weight:      Height:        Start Time: 1104 hrs. End Time: 1115 hrs.  Imaging Guidance (Spinal):          Type of Imaging Technique: Fluoroscopy Guidance (Spinal) Indication(s):  Assistance in needle guidance and placement for procedures requiring needle placement in or near specific anatomical locations not easily accessible without such assistance. Exposure Time: Please see nurses notes. Contrast: Before injecting any contrast, we confirmed that the patient did not have an allergy to iodine, shellfish, or radiological contrast. Once satisfactory needle placement was completed at the desired level, radiological contrast was injected. Contrast injected under live fluoroscopy. No contrast complications. See chart for type and volume of contrast used. Fluoroscopic Guidance: I was personally present during the use of fluoroscopy. "Tunnel Vision Technique" used to obtain the best possible view of the target area. Parallax error corrected before commencing the procedure. "Direction-depth-direction" technique used to introduce the needle under continuous pulsed fluoroscopy. Once target was reached, antero-posterior, oblique, and lateral fluoroscopic projection used confirm needle placement in all planes. Images permanently stored in EMR. Interpretation: I personally interpreted the imaging intraoperatively. Adequate needle placement confirmed in multiple planes. Appropriate spread of contrast into desired area was observed. No evidence of afferent or efferent intravascular uptake. No intrathecal or subarachnoid spread observed. Permanent images saved into the patient's record.  Antibiotic Prophylaxis:   Anti-infectives (From admission, onward)    None      Indication(s): None identified  Post-operative Assessment:  Post-procedure Vital Signs:  Pulse/HCG Rate: (!) 10188 Temp: (!) 97 F (36.1 C) Resp: 16 BP: (!) 140/82 SpO2: 100 %  EBL: None  Complications: No immediate post-treatment complications observed by team, or reported by patient.  Note: The patient tolerated the entire procedure well. A repeat set of vitals were taken after the procedure and the patient was  kept under observation following institutional policy, for this type of procedure. Post-procedural neurological assessment was performed, showing return to baseline, prior to discharge. The patient was provided with post-procedure discharge instructions, including a section on how to identify potential problems. Should any problems arise concerning this procedure, the patient was given instructions to immediately contact us, at any time, without hesitation. In any case, we  plan to contact the patient by telephone for a follow-up status report regarding this interventional procedure.  Comments:  No additional relevant information.  Plan of Care (POC)  Orders:  Orders Placed This Encounter  Procedures   Thoracic Epidural Injection    Scheduling Instructions:     Side: Right-sided     Sedation: With Sedation.     Timeframe: Today    Order Specific Question:   Where will this procedure be performed?    Answer:   [facility]   [facility] C-ARM 1-60 MIN NO REPORT    Intraoperative interpretation by procedural physician at [facility].    Standing Status:   Standing    Number of Occurrences:   1    Order Specific Question:   Reason for exam:    Answer:   Assistance in needle guidance and placement for procedures requiring needle placement in or near specific anatomical locations not easily accessible without such assistance.   Informed Consent Details: Physician/Practitioner Attestation; Transcribe to consent form and obtain patient signature    Provider Attestation: I, Rosemary Mossbarger A. Laban Emperor, MD, (Pain Management Specialist), the physician/practitioner, attest that I have discussed with the patient the benefits, risks, side effects, alternatives, likelihood of achieving goals and potential problems during recovery for the procedure that I have provided informed consent.    Scheduling Instructions:     Nursing Order: Transcribe to consent form and obtain patient signature.      Note: Always confirm laterality of pain with Ms. Falconer, before procedure.    Order Specific Question:   Physician/Practitioner attestation of informed consent for procedure/surgical case    Answer:   I, the physician/practitioner, attest that I have discussed with the patient the benefits, risks, side effects, alternatives, likelihood of achieving goals and potential problems during recovery for the procedure that I have provided informed consent.    Order Specific Question:   Procedure    Answer:   Thoracic Epidural Steroid Injection/Block under fluoroscopic guidance    Order Specific Question:   Physician/Practitioner performing the procedure    Answer:   Tinesha Siegrist A. Laban Emperor MD    Order Specific Question:   Indication/Reason    Answer:   Upper (Thoracic) Back Pain with or without Thoracic Radicular Pain (Rib/Flank pain) secondary to Thoracic Degenerative Disc Disease and/or Thoracic Intervertebral Disc Displacement, with or without Thoracic Radiculitis/Radiculopathy.   Care order/instruction: Please confirm that the patient has stopped the Plavix (Clopidogrel) x 7-10 days prior to procedure or surgery.    Please confirm that the patient has stopped the Plavix (Clopidogrel) x 7-10 days prior to procedure or surgery.    Standing Status:   Standing    Number of Occurrences:   1   Provide equipment / supplies at bedside    Procedural tray: Epidural Tray (Disposable  single use) Skin infiltration needle: Regular 1.5-in, 25-G, (x1) Block needle size: Regular standard Catheter: No catheter required    Standing Status:   Standing    Number of Occurrences:   1    Order Specific Question:   Specify    Answer:   Epidural Tray   Bleeding precautions    Standing Status:   Standing    Number of Occurrences:   1   Chronic Opioid Analgesic:  None MME/day: 0 mg/day   Medications ordered for procedure: Meds ordered this encounter  Medications   iohexol (OMNIPAQUE) 180 MG/ML injection 10 mL     Must be Myelogram-compatible. If not available,  you may substitute with a water-soluble, non-ionic, hypoallergenic, myelogram-compatible radiological contrast medium.   lidocaine (XYLOCAINE) 2 % (with pres) injection 400 mg   pentafluoroprop-tetrafluoroeth (GEBAUERS) aerosol   lactated ringers infusion   midazolam (VERSED) 5 MG/5ML injection 0.5-2 mg    Make sure Flumazenil is available in the pyxis when using this medication. If oversedation occurs, administer 0.2 mg IV over 15 sec. If after 45 sec no response, administer 0.2 mg again over 1 min; may repeat at 1 min intervals; not to exceed 4 doses (1 mg)   fentaNYL (SUBLIMAZE) injection 25-50 mcg    Make sure Narcan is available in the pyxis when using this medication. In the event of respiratory depression (RR< 8/min): Titrate NARCAN (naloxone) in increments of 0.1 to 0.2 mg IV at 2-3 minute intervals, until desired degree of reversal.   sodium chloride flush (NS) 0.9 % injection 2 mL   ropivacaine (PF) 2 mg/mL (0.2%) (NAROPIN) injection 2 mL   dexamethasone (DECADRON) injection 10 mg   Medications administered: We administered iohexol, lidocaine, pentafluoroprop-tetrafluoroeth, lactated ringers, midazolam, fentaNYL, sodium chloride flush, ropivacaine (PF) 2 mg/mL (0.2%), and dexamethasone.  See the medical record for exact dosing, route, and time of administration.  Follow-up plan:   Return in about 2 weeks (around 03/10/2023) for (Face2F), (PPE).       Interventional Therapies  Risk Factors  Considerations  Medical Comorbidities:  Plavix Anticoagulation: (Stop: 7-10 days  Restart: 2 hours)    Planned  Pending:   Diagnostic/therapeutic left T6-7 thoracic ESI #1    Under consideration:   Diagnostic/therapeutic left T6-7 thoracic ESI #1  Diagnostic left thoracic facet MBB #1    Completed:   None at this time   Therapeutic  Palliative (PRN) options:   None established   Completed by other providers:   Therapeutic left  thoracic dry needling  Therapeutic chiropractic manipulations        Recent Visits Date Type Provider Dept  02/02/23 Office Visit Delano Metz, MD [facility]  Showing recent visits within past 90 days and meeting all other requirements Today's Visits Date Type Provider Dept  02/24/23 Procedure visit Delano Metz, MD [facility]  Showing today's visits and meeting all other requirements Future Appointments Date Type Provider Dept  03/15/23 Appointment Delano Metz, MD [facility]  Showing future appointments within next 90 days and meeting all other requirements  Disposition: Discharge home  Discharge (Date  Time): 02/24/2023; 1145 hrs.   Primary Care Physician: Joanna Cords, DO Location: [facility] Note by: Joanna Done, MD (TTS technology used. I apologize for any typographical errors that were not detected and corrected.) Date: 02/24/2023; Time: 1:10 PM  Disclaimer:  Medicine is not an Visual merchandiser. The only guarantee in medicine is that nothing is guaranteed. It is important to note that the decision to proceed with this intervention was based on the information collected from the patient. The Data and conclusions were drawn from the patient's questionnaire, the interview, and the physical examination. Because the information was provided in large part by the patient, it cannot be guaranteed that it has not been purposely or unconsciously manipulated. Every effort has been made to obtain as much relevant data as possible for this evaluation. It is important to note that the conclusions that lead to this procedure are derived in large part from the available data. Always take into account that the treatment will also be dependent on availability of resources and existing  treatment guidelines, considered by other Pain Management Practitioners as being common knowledge and practice, at the time  of the intervention. For Medico-Legal purposes, it is also important to point out that variation in procedural techniques and pharmacological choices are the acceptable norm. The indications, contraindications, technique, and results of the above procedure should only be interpreted and judged by a Board-Certified Interventional Pain Specialist with extensive familiarity and expertise in the same exact procedure and technique.

## 2023-02-24 NOTE — Patient Instructions (Signed)
______________________________________________________________________    Post-Procedure Discharge Instructions  Instructions: Apply ice:  Purpose: This will minimize any swelling and discomfort after procedure.  When: Day of procedure, as soon as you get home. How: Fill a plastic sandwich bag with crushed ice. Cover it with a small towel and apply to injection site. How long: (15 min on, 15 min off) Apply for 15 minutes then remove x 15 minutes.  Repeat sequence on day of procedure, until you go to bed. Apply heat:  Purpose: To treat any soreness and discomfort from the procedure. When: Starting the next day after the procedure. How: Apply heat to procedure site starting the day following the procedure. How long: May continue to repeat daily, until discomfort goes away. Food intake: Start with clear liquids (like water) and advance to regular food, as tolerated.  Physical activities: Keep activities to a minimum for the first 8 hours after the procedure. After that, then as tolerated. Driving: If you have received any sedation, be responsible and do not drive. You are not allowed to drive for 24 hours after having sedation. Blood thinner: (Applies only to those taking blood thinners) You may restart your blood thinner 6 hours after your procedure. Insulin: (Applies only to Diabetic patients taking insulin) As soon as you can eat, you may resume your normal dosing schedule. Infection prevention: Keep procedure site clean and dry. Shower daily and clean area with soap and water. Post-procedure Pain Diary: Extremely important that this be done correctly and accurately. Recorded information will be used to determine the next step in treatment. For the purpose of accuracy, follow these rules: Evaluate only the area treated. Do not report or include pain from an untreated area. For the purpose of this evaluation, ignore all other areas of pain, except for the treated area. After your procedure,  avoid taking a long nap and attempting to complete the pain diary after you wake up. Instead, set your alarm clock to go off every hour, on the hour, for the initial 8 hours after the procedure. Document the duration of the numbing medicine, and the relief you are getting from it. Do not go to sleep and attempt to complete it later. It will not be accurate. If you received sedation, it is likely that you were given a medication that may cause amnesia. Because of this, completing the diary at a later time may cause the information to be inaccurate. This information is needed to plan your care. Follow-up appointment: Keep your post-procedure follow-up evaluation appointment after the procedure (usually 2 weeks for most procedures, 6 weeks for radiofrequencies). DO NOT FORGET to bring you pain diary with you.   Expect: (What should I expect to see with my procedure?) From numbing medicine (AKA: Local Anesthetics): Numbness or decrease in pain. You may also experience some weakness, which if present, could last for the duration of the local anesthetic. Onset: Full effect within 15 minutes of injected. Duration: It will depend on the type of local anesthetic used. On the average, 1 to 8 hours.  From steroids (Applies only if steroids were used): Decrease in swelling or inflammation. Once inflammation is improved, relief of the pain will follow. Onset of benefits: Depends on the amount of swelling present. The more swelling, the longer it will take for the benefits to be seen. In some cases, up to 10 days. Duration: Steroids will stay in the system x 2 weeks. Duration of benefits will depend on multiple posibilities including persistent irritating factors. Side-effects: If  present, they may typically last 2 weeks (the duration of the steroids). Frequent: Cramps (if they occur, drink Gatorade and take over-the-counter Magnesium 450-500 mg once to twice a day); water retention with temporary weight gain;  increases in blood sugar; decreased immune system response; increased appetite. Occasional: Facial flushing (red, warm cheeks); mood swings; menstrual changes. Uncommon: Long-term decrease or suppression of natural hormones; bone thinning. (These are more common with higher doses or more frequent use. This is why we prefer that our patients avoid having any injection therapies in other practices.)  Very Rare: Severe mood changes; psychosis; aseptic necrosis. From procedure: Some discomfort is to be expected once the numbing medicine wears off. This should be minimal if ice and heat are applied as instructed.  Call if: (When should I call?) You experience numbness and weakness that gets worse with time, as opposed to wearing off. New onset bowel or bladder incontinence. (Applies only to procedures done in the spine)  Emergency Numbers: Durning business hours (Monday - Thursday, 8:00 AM - 4:00 PM) (Friday, 9:00 AM - 12:00 Noon): [phone] After hours: [phone] NOTE: If you are having a problem and are unable connect with, or to talk to a provider, then go to your nearest urgent care or emergency department. If the problem is serious and urgent, please call 911. ______________________________________________________________________      ______________________________________________________________________    Blood Thinners  IMPORTANT NOTICE:  If you take any of these, make sure to notify the nursing staff.  Failure to do so may result in serious injury.  Recommended time intervals to stop and restart blood-thinners, before & after invasive procedures  Generic Name Brand Name Pre-procedure: Stop medication for this amount of time before your procedure: Post-procedure: Wait this amount of time after the procedure before restarting your medication:  Abciximab Reopro 15 days 2 hrs  Alteplase Activase 10 days 10 days  Anagrelide Agrylin    Apixaban Eliquis 3 days 6 hrs   Cilostazol Pletal 3 days 5 hrs  Clopidogrel Plavix 7-10 days 2 hrs  Dabigatran Pradaxa 5 days 6 hrs  Dalteparin Fragmin 24 hours 4 hrs  Dipyridamole Aggrenox 11days 2 hrs  Edoxaban Lixiana; Savaysa 3 days 2 hrs  Enoxaparin  Lovenox 24 hours 4 hrs  Eptifibatide Integrillin 8 hours 2 hrs  Fondaparinux  Arixtra 72 hours 12 hrs  Hydroxychloroquine Plaquenil 11 days   Prasugrel Effient 7-10 days 6 hrs  Reteplase Retavase 10 days 10 days  Rivaroxaban Xarelto 3 days 6 hrs  Ticagrelor Brilinta 5-7 days 6 hrs  Ticlopidine Ticlid 10-14 days 2 hrs  Tinzaparin Innohep 24 hours 4 hrs  Tirofiban Aggrastat 8 hours 2 hrs  Warfarin Coumadin 5 days 2 hrs   Other medications with blood-thinning effects  Product indications Generic (Brand) names Note  Cholesterol Lipitor Stop 4 days before procedure  Blood thinner (injectable) Heparin (LMW or LMWH Heparin) Stop 24 hours before procedure  Cancer Ibrutinib (Imbruvica) Stop 7 days before procedure  Malaria/Rheumatoid Hydroxychloroquine (Plaquenil) Stop 11 days before procedure  Thrombolytics  10 days before or after procedures   Over-the-counter (OTC) Products with blood-thinning effects  Product Common names Stop Time  Aspirin > 325 mg Goody Powders, Excedrin, etc. 11 days  Aspirin ? 81 mg  7 days  Fish oil  4 days  Garlic supplements  7 days  Ginkgo biloba  36 hours  Ginseng  24 hours  NSAIDs Ibuprofen, Naprosyn, etc. 3 days  Vitamin E  4 days   ______________________________________________________________________

## 2023-02-25 ENCOUNTER — Telehealth: Payer: Self-pay

## 2023-02-25 NOTE — Telephone Encounter (Signed)
Post procedure follow up.  Patient states she is doing good.  

## 2023-03-01 ENCOUNTER — Encounter: Payer: 59 | Admitting: Physical Therapy

## 2023-03-03 ENCOUNTER — Encounter: Payer: 59 | Admitting: Physical Therapy

## 2023-03-08 ENCOUNTER — Encounter: Payer: 59 | Admitting: Physical Therapy

## 2023-03-08 NOTE — Telephone Encounter (Signed)
Chart reviewed.

## 2023-03-10 ENCOUNTER — Other Ambulatory Visit: Payer: Self-pay | Admitting: Family Medicine

## 2023-03-10 ENCOUNTER — Encounter: Payer: 59 | Admitting: Physical Therapy

## 2023-03-10 DIAGNOSIS — G8929 Other chronic pain: Secondary | ICD-10-CM

## 2023-03-10 DIAGNOSIS — M545 Low back pain, unspecified: Secondary | ICD-10-CM

## 2023-03-11 NOTE — Telephone Encounter (Signed)
Requested medication (s) are due for refill today: Yes  Requested medication (s) are on the active medication list: Yes  Last refill:  10/19/22  Future visit scheduled: No  Notes to clinic:  Not delegated.    Requested Prescriptions  Pending Prescriptions Disp Refills   tiZANidine (ZANAFLEX) 4 MG tablet [Pharmacy Med Name: TIZANIDINE HCL 4 MG TABLET] 90 tablet 0    Sig: TAKE ONE TABLET EVERY 8 HOURS AS NEEDED FOR MUSCLE SPASM.     Not Delegated - Cardiovascular:  Alpha-2 Agonists - tizanidine Failed - 03/10/2023 11:57 AM      Failed - This refill cannot be delegated      Passed - Valid encounter within last 6 months    Recent Outpatient Visits           1 month ago Essential hypertension   [facility] [place], Netta Neat, DO   2 months ago Essential hypertension   [facility] Smitty Cords, DO   3 months ago Chronic left-sided thoracic back pain   [facility], Ocie Bob, MD   3 months ago Hypertensive urgency   [place] [facility] Lorre Munroe, NP   3 months ago Chronic left-sided thoracic back pain   [facility], Ocie Bob, MD       Future Appointments             In 6 days Dunn, Raymon Mutton, PA-C [facility]

## 2023-03-14 ENCOUNTER — Encounter (INDEPENDENT_AMBULATORY_CARE_PROVIDER_SITE_OTHER): Payer: 59 | Admitting: Family Medicine

## 2023-03-14 DIAGNOSIS — B370 Candidal stomatitis: Secondary | ICD-10-CM

## 2023-03-14 MED ORDER — NYSTATIN 100000 UNIT/ML MT SUSP
5.0000 mL | Freq: Four times a day (QID) | OROMUCOSAL | 0 refills | Status: DC
Start: 2023-03-14 — End: 2023-04-20

## 2023-03-14 MED ORDER — FLUCONAZOLE 150 MG PO TABS
ORAL_TABLET | ORAL | 0 refills | Status: DC
Start: 2023-03-14 — End: 2023-04-20

## 2023-03-14 NOTE — Progress Notes (Deleted)
Cardiology Office Note    Date:  03/14/2023   ID:  Joanna Garcia, DOB 1976-02-10, MRN [id number]  PCP:  Smitty Cords, DO  Cardiologist:  Debbe Odea, MD  Electrophysiologist:  None   Chief Complaint: ***  History of Present Illness:   Joanna Garcia is a 47 y.o. female with history of ***  ***   Labs independently reviewed: 01/2023 - potassium 4.2, BUN 10, serum creatinine 0.81, Hgb 12.2, PLT 200, albumin 3.3, AST/ALT normal, TSH normal, magnesium 1.9, TC 218, TG 283, HDL 81, LDL 96 12/2022 - A1c 5.7  Past Medical History:  Diagnosis Date   DVT (deep venous thrombosis) (HCC)    GERD (gastroesophageal reflux disease)    Hypertension    Polycythemia    Polyneuropathy    Stroke [facility])     Past Surgical History:  Procedure Laterality Date   COLONOSCOPY N/A 03/16/2019   Procedure: COLONOSCOPY;  Surgeon: Toledo, Boykin Nearing, MD;  Location: [facility] ENDOSCOPY;  Service: Gastroenterology;  Laterality: N/A;   ESOPHAGOGASTRODUODENOSCOPY N/A 03/16/2019   Procedure: ESOPHAGOGASTRODUODENOSCOPY (EGD);  Surgeon: Toledo, Boykin Nearing, MD;  Location: [facility] ENDOSCOPY;  Service: Gastroenterology;  Laterality: N/A;   IR ANGIO INTRA EXTRACRAN SEL COM CAROTID INNOMINATE UNI L MOD SED  02/09/2021   IR INTRAVSC STENT CERV CAROTID W/EMB-PROT MOD SED INCL ANGIO  02/09/2021   IR PERCUTANEOUS ART THROMBECTOMY/INFUSION INTRACRANIAL INC DIAG ANGIO  02/09/2021   IR US GUIDE VASC ACCESS RIGHT  02/09/2021   NO PAST SURGERIES     RADIOLOGY WITH ANESTHESIA N/A 02/09/2021   Procedure: IR WITH ANESTHESIA;  Surgeon: Radiologist, Medication, MD;  Location: [facility] OR;  Service: Radiology;  Laterality: N/A;   RENAL ANGIOGRAPHY Left 01/24/2023   Procedure: RENAL ANGIOGRAPHY;  Surgeon: Annice Needy, MD;  Location: [facility] INVASIVE CV LAB;  Service: Cardiovascular;  Laterality: Left;    Current Medications: No outpatient medications have been marked as taking for the 03/17/23 encounter (Appointment) with Sondra Barges, PA-C.    Allergies:   Patient has no known allergies.   Social History   Socioeconomic History   Marital status: Divorced    Spouse name: Not on file   Number of children: Not on file   Years of education: Not on file   Highest education level: 11th grade  Occupational History   Not on file  Tobacco Use   Smoking status: Every Day    Current packs/day: 0.75    Average packs/day: 0.8 packs/day for 30.7 years (23.0 ttl pk-yrs)    Types: Cigarettes    Start date: 06/21/1992   Smokeless tobacco: Never  Vaping Use   Vaping status: Never Used  Substance and Sexual Activity   Alcohol use: Yes    Comment: 24 Oz daily   Drug use: Not Currently    Types: Marijuana   Sexual activity: Not Currently  Other Topics Concern   Not on file  Social History Narrative   Lives at home with mother and 2 dogs Charlie and Clauie    Drives   Social Determinants of Health   Financial Resource Strain: Medium Risk (12/06/2022)   Overall Financial Resource Strain (CARDIA)    Difficulty of Paying Living Expenses: Somewhat hard  Food Insecurity: No Food Insecurity (01/22/2023)   Hunger Vital Sign    Worried About Running Out of Food in the Last Year: Never true    Ran Out of Food in the Last Year: Never true  Transportation Needs: No Transportation Needs (01/22/2023)  PRAPARE - Administrator, Civil Service (Medical): No    Lack of Transportation (Non-Medical): No  Physical Activity: Sufficiently Active (12/06/2022)   Exercise Vital Sign    Days of Exercise per Week: 5 days    Minutes of Exercise per Session: 30 min  Stress: No Stress Concern Present (12/06/2022)   [employer] of Occupational Health - Occupational Stress Questionnaire    Feeling of Stress : Not at all  Social Connections: Moderately Isolated (12/06/2022)   Social Connection and Isolation Panel [NHANES]    Frequency of Communication with Friends and Family: More than three times a week    Frequency of Social  Gatherings with Friends and Family: Three times a week    Attends Religious Services: 1 to 4 times per year    Active Member of Clubs or Organizations: No    Attends Engineer, structural: Not on file    Marital Status: Divorced     Family History:  The patient's family history includes Breast cancer in her paternal grandmother; COPD in her mother; Cancer in her father and paternal grandmother; Heart disease in her mother; Hemochromatosis in her maternal aunt, maternal aunt, maternal uncle, and mother; Hyperlipidemia in her father and mother; Hypertension in her father and mother; Leukemia in her paternal uncle.  ROS:   12-point review of systems is negative unless otherwise noted in the HPI.   EKGs/Labs/Other Studies Reviewed:    Studies reviewed were summarized above. The additional studies were reviewed today:  Coronary CTA 02/02/2023: FINDINGS: Aorta: Normal size. Mild aortic wall calcifications. No dissection.   Aortic Valve:  Trileaflet.  No calcifications.   Coronary Arteries:  Normal coronary origin.  Right dominance.   RCA is a dominant artery. There is calcified plaque proximally causing mild stenosis (25-49%).   Left main gives rise to LAD and LCX arteries. LM has no disease.   LAD has calcified plaque proximally causing minimal stenosis (<25%).   LCX is a non-dominant artery. There is calcified plaque proximally causing minimal stenosis (<25%).   Other findings:   Normal pulmonary vein drainage into the left atrium.   Normal left atrial appendage without a thrombus.   Normal size of the pulmonary artery.   IMPRESSION: 1. Coronary calcium score of 131. This was 99th percentile for age and sex matched control. 2. Normal coronary origin with right dominance. 3. Mild proximal RCA stenosis (25-49%). 4. Minimal LAD and LCx stenosis (<25%). 5. CAD-RADS 2. Mild non-obstructive CAD (25-49%). Consider non-atherosclerotic causes of chest pain. Consider  preventive therapy and risk factor modification. __________  2D echo 01/23/2023: 1. Left ventricular ejection fraction, by estimation, is 55 to 60%. The  left ventricle has normal function. The left ventricle has no regional  wall motion abnormalities. Left ventricular diastolic parameters were  normal. The average left ventricular  global longitudinal strain is -16.7 %. The global longitudinal strain is  normal.   2. Right ventricular systolic function is normal. The right ventricular  size is normal.   3. The mitral valve is normal in structure. Mild mitral valve  regurgitation.   4. The aortic valve was not well visualized. Aortic valve regurgitation  is not visualized. Aortic valve sclerosis is present, with no evidence of  aortic valve stenosis.   5. The inferior vena cava is normal in size with greater than 50%  respiratory variability, suggesting right atrial pressure of 3 mmHg.  __________  2D echo 02/10/2021: 1. Left ventricular ejection fraction, by estimation,  is 60 to 65%. The  left ventricle has normal function. The left ventricle has no regional  wall motion abnormalities. Left ventricular diastolic parameters were  normal.   2. Right ventricular systolic function is normal. The right ventricular  size is normal. Tricuspid regurgitation signal is inadequate for assessing  PA pressure.   3. Mild subvalvular calcification. The mitral valve is grossly normal.  Trivial mitral valve regurgitation. No evidence of mitral stenosis.   4. The aortic valve is normal in structure. Aortic valve regurgitation is  not visualized. No aortic stenosis is present.   5. The inferior vena cava is normal in size with <50% respiratory  variability, suggesting right atrial pressure of 8 mmHg.  __________  2D echo 03/11/2019: 1. Left ventricular ejection fraction, by visual estimation, is 60 to  65%. The left ventricle has normal function. Normal left ventricular size.  There is no left  ventricular hypertrophy.   2. Global right ventricle has normal systolic function.The right  ventricular size is normal. No increase in right ventricular wall  thickness.   3. Left atrial size was normal.   4. TR signal is inadequate for assessing pulmonary artery systolic  pressure.   5. The inferior vena cava is normal in size with greater than 50%  respiratory variability, suggesting right atrial pressure of 3 mmHg.    EKG:  EKG is ordered today.  The EKG ordered today demonstrates ***  Recent Labs: 01/22/2023: B Natriuretic Peptide 17.0; Magnesium 1.9; TSH 4.344 01/23/2023: ALT 12 01/25/2023: BUN 10; Creatinine, Ser 0.81; Hemoglobin 12.2; Platelets 200; Potassium 4.2; Sodium 134  Recent Lipid Panel    Component Value Date/Time   CHOL 218 (H) 01/22/2023 1730   TRIG 203 (H) 01/22/2023 1730   HDL 81 01/22/2023 1730   CHOLHDL 2.7 01/22/2023 1730   VLDL 41 (H) 01/22/2023 1730   LDLCALC 96 01/22/2023 1730   LDLCALC 151 (H) 12/22/2022 0948    PHYSICAL EXAM:    VS:  There were no vitals taken for this visit.  BMI: There is no height or weight on file to calculate BMI.  Physical Exam  Wt Readings from Last 3 Encounters:  02/24/23 117 lb (53.1 kg)  02/08/23 122 lb (55.3 kg)  02/02/23 121 lb (54.9 kg)     ASSESSMENT & PLAN:   ***   {Are you ordering a CV Procedure (e.g. stress test, cath, DCCV, TEE, etc)?   Press F2        :[id number]}     Disposition: F/u with Dr. Azucena Cecil or an APP in ***.   Medication Adjustments/Labs and Tests Ordered: Current medicines are reviewed at length with the patient today.  Concerns regarding medicines are outlined above. Medication changes, Labs and Tests ordered today are summarized above and listed in the Patient Instructions accessible in Encounters.   Signed, Eula Listen, PA-C 03/14/2023 2:36 PM     [facility] [place] [address] [place], [place] [address] [phone]

## 2023-03-14 NOTE — Telephone Encounter (Signed)
Please see the MyChart message reply(ies) for my assessment and plan.    This patient gave consent for this Medical Advice Message and is aware that it may result in a bill to [employer], as well as the possibility of receiving a bill for a co-payment or deductible. They are an established patient, but are not seeking medical advice exclusively about a problem treated during an in person or video visit in the last seven days. I did not recommend an in person or video visit within seven days of my reply.    I spent a total of 5 minutes cumulative time within 7 days through [employer].  [name], DO

## 2023-03-15 ENCOUNTER — Ambulatory Visit: Payer: 59 | Admitting: Pain Medicine

## 2023-03-17 ENCOUNTER — Ambulatory Visit: Payer: 59 | Admitting: Physician Assistant

## 2023-03-18 NOTE — Progress Notes (Unsigned)
PROVIDER NOTE: Information contained herein reflects review and annotations entered in association with encounter. Interpretation of such information and data should be left to medically-trained personnel. Information provided to patient can be located elsewhere in the medical record under "Patient Instructions". Document created using STT-dictation technology, any transcriptional errors that may result from process are unintentional.    Patient: Joanna Garcia  Service Category: E/M  Provider: Oswaldo Done, MD  DOB: Jul 31, 1975  DOS: 03/23/2023  Referring Provider: Saralyn Garcia *  MRN: [id number]  Specialty: Interventional Pain Management  PCP: Joanna Cords, DO  Type: Established Patient  Setting: Ambulatory outpatient    Location: Office  Delivery: Face-to-face     HPI  [name], a 47 y.o. year old female, is here today because of her No primary diagnosis found.. [name]'s primary complain today is No chief complaint on file.  Pertinent problems: 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 Pain Assessment: Severity of   is reported as a  /10. Location:    / . Onset:  . Quality:  . Timing:  . Modifying factor(s):  Marland Kitchen Vitals:  vitals were not taken for this visit.  BMI: Estimated body mass index is 21.4 kg/m as calculated from the following:   Height as of 02/24/23: 5\' 2"  (1.575 m).   Weight as of 02/24/23: 117 lb (53.1 kg). Last encounter: 02/02/2023. Last procedure: 02/24/2023.  Reason for encounter:  *** . ***  Pharmacotherapy Assessment  Analgesic: None MME/day: 0 mg/day   Monitoring: [place] PMP: PDMP reviewed during this encounter.       Pharmacotherapy: No side-effects or adverse reactions  reported. Compliance: No problems identified. Effectiveness: Clinically acceptable.  No notes on file  No results found for: "CBDTHCR" No results found for: "D8THCCBX" No results found for: "D9THCCBX"  UDS:  Summary  Date Value Ref Range Status  02/02/2023 Note  Final    Comment:    ==================================================================== Compliance Drug Analysis, Ur ==================================================================== Test                             Result       Flag       Units  Drug Present and Declared for Prescription Verification   Gabapentin                     PRESENT      EXPECTED   Salicylate                     PRESENT      EXPECTED   Metoprolol                     PRESENT      EXPECTED  Drug Present not Declared for Prescription Verification   Carboxy-THC                    374          UNEXPECTED ng/mg creat    Carboxy-THC is a metabolite of tetrahydrocannabinol (THC). Source of    THC is most commonly herbal marijuana or marijuana-based products,    but THC is also present in a scheduled prescription medication.    Trace amounts  of THC can be present in hemp and cannabidiol (CBD)    products. This test is not intended to distinguish between delta-9-    tetrahydrocannabinol, the predominant form of THC in most herbal or    marijuana-based products, and delta-8-tetrahydrocannabinol.  Drug Absent but Declared for Prescription Verification   Tizanidine                     Not Detected UNEXPECTED    Tizanidine, as indicated in the declared medication list, is not    always detected even when used as directed.  ==================================================================== Test                      Result    Flag   Units      Ref Range   Creatinine              76               mg/dL      >=57 ==================================================================== Declared Medications:  The flagging and interpretation on this report  are based on the  following declared medications.  Unexpected results may arise from  inaccuracies in the declared medications.   **Note: The testing scope of this panel includes these medications:   Gabapentin (Neurontin)  Metoprolol (Lopressor)   **Note: The testing scope of this panel does not include small to  moderate amounts of these reported medications:   Aspirin  Tizanidine (Zanaflex)   **Note: The testing scope of this panel does not include the  following reported medications:   Amlodipine (Norvasc)  Atorvastatin (Lipitor)  Clopidogrel (Plavix)  Losartan (Cozaar)  Meloxicam (Mobic)  Nicotine  Ondansetron (Zofran)  Pantoprazole (Protonix) ==================================================================== For clinical consultation, please call [phone]. ====================================================================       ROS  Constitutional: Denies any fever or chills Gastrointestinal: No reported hemesis, hematochezia, vomiting, or acute GI distress Musculoskeletal: Denies any acute onset joint swelling, redness, loss of ROM, or weakness Neurological: No reported episodes of acute onset apraxia, aphasia, dysarthria, agnosia, amnesia, paralysis, loss of coordination, or loss of consciousness  Medication Review  amLODipine, aspirin, atorvastatin, clopidogrel, fluconazole, gabapentin, losartan, meloxicam, nystatin, ondansetron, pantoprazole, and tiZANidine  History Review  Allergy: [name] has No Known Allergies. Drug: [name]  reports that she does not currently use drugs after having used the following drugs: Marijuana. Alcohol:  reports current alcohol use. Tobacco:  reports that she has been smoking cigarettes. She started smoking about 30 years ago. She has a 23.1 pack-year smoking history. She has never used smokeless tobacco. Social: [name]  reports that she has been smoking cigarettes. She started smoking about 30 years ago. She  has a 23.1 pack-year smoking history. She has never used smokeless tobacco. She reports current alcohol use. She reports that she does not currently use drugs after having used the following drugs: Marijuana. Medical:  has a past medical history of DVT (deep venous thrombosis) (HCC), GERD (gastroesophageal reflux disease), Hypertension, Polycythemia, Polyneuropathy, and Stroke (HCC). Surgical: Ms. Wied  has a past surgical history that includes No past surgeries; Esophagogastroduodenoscopy (N/A, 03/16/2019); Colonoscopy (N/A, 03/16/2019); Radiology with anesthesia (N/A, 02/09/2021); IR ANGIO INTRA EXTRACRAN SEL COM CAROTID INNOMINATE UNI L MOD SED (02/09/2021); IR INTRAVSC STENT CERV CAROTID W/EMB-PROT MOD SED (02/09/2021); IR PERCUTANEOUS ART THROMBECTOMY/INFUSION INTRACRANIAL INC DIAG ANGIO (02/09/2021); IR US Guide Vasc Access Right (02/09/2021); and RENAL ANGIOGRAPHY (Left, 01/24/2023). Family: family history includes Breast cancer in her paternal grandmother; COPD in  her mother; Cancer in her father and paternal grandmother; Heart disease in her mother; Hemochromatosis in her maternal aunt, maternal aunt, maternal uncle, and mother; Hyperlipidemia in her father and mother; Hypertension in her father and mother; Leukemia in her paternal uncle.  Laboratory Chemistry Profile   Renal Lab Results  Component Value Date   BUN 10 01/25/2023   CREATININE 0.81 01/25/2023   BCR SEE NOTE: 12/22/2022   GFRAA >60 06/05/2019   GFRNONAA >60 01/25/2023    Hepatic Lab Results  Component Value Date   AST 20 01/23/2023   ALT 12 01/23/2023   ALBUMIN 3.3 (L) 01/23/2023   ALKPHOS 66 01/23/2023    Electrolytes Lab Results  Component Value Date   NA 134 (L) 01/25/2023   K 4.2 01/25/2023   CL 101 01/25/2023   CALCIUM 8.3 (L) 01/25/2023   MG 1.9 01/22/2023    Bone No results found for: "VD25OH", "VD125OH2TOT", "XL2440NU2", "VO5366YQ0", "25OHVITD1", "25OHVITD2", "25OHVITD3", "TESTOFREE", "TESTOSTERONE"   Inflammation (CRP: Acute Phase) (ESR: Chronic Phase) Lab Results  Component Value Date   CRP <1 02/02/2023   ESRSEDRATE 2 02/02/2023   LATICACIDVEN 0.8 03/15/2019         Note: Above Lab results reviewed.  Recent Imaging Review  DG PAIN CLINIC C-ARM 1-60 MIN NO REPORT Fluoro was used, but no Radiologist interpretation will be provided.  Please refer to "NOTES" tab for provider progress note. Note: Reviewed        Physical Exam  General appearance: Well nourished, well developed, and well hydrated. In no apparent acute distress Mental status: Alert, oriented x 3 (person, place, & time)       Respiratory: No evidence of acute respiratory distress Eyes: PERLA Vitals: There were no vitals taken for this visit. BMI: Estimated body mass index is 21.4 kg/m as calculated from the following:   Height as of 02/24/23: 5\' 2"  (1.575 m).   Weight as of 02/24/23: 117 lb (53.1 kg). Ideal: Patient weight not recorded  Assessment   Diagnosis Status  No diagnosis found. Controlled Controlled Controlled   Updated Problems: No problems updated.  Plan of Care  Problem-specific:  No problem-specific Assessment & Plan notes found for this encounter.  Ms. AVARY BESSIRE has a current medication list which includes the following long-term medication(s): amlodipine, atorvastatin, gabapentin, losartan, and pantoprazole.  Pharmacotherapy (Medications Ordered): No orders of the defined types were placed in this encounter.  Orders:  No orders of the defined types were placed in this encounter.  Follow-up plan:   No follow-ups on file.      Interventional Therapies  Risk Factors  Considerations  Medical Comorbidities:  Plavix Anticoagulation: (Stop: 7-10 days  Restart: 2 hours)    Planned  Pending:   Diagnostic/therapeutic left T6-7 thoracic ESI #1    Under consideration:   Diagnostic/therapeutic left T6-7 thoracic ESI #1  Diagnostic left thoracic facet MBB #1    Completed:    None at this time   Therapeutic  Palliative (PRN) options:   None established   Completed by other providers:   Therapeutic left thoracic dry needling  Therapeutic chiropractic manipulations         Recent Visits Date Type Provider Dept  02/24/23 Procedure visit Delano Metz, MD [facility]  02/02/23 Office Visit Delano Metz, MD [facility]  Showing recent visits within past 90 days and meeting all other requirements Future Appointments Date Type Provider Dept  03/23/23 Appointment Delano Metz, MD [facility]  Showing future  appointments within next 90 days and meeting all other requirements  I discussed the assessment and treatment plan with the patient. The patient was provided an opportunity to ask questions and all were answered. The patient agreed with the plan and demonstrated an understanding of the instructions.  Patient advised to call back or seek an in-person evaluation if the symptoms or condition worsens.  Duration of encounter: *** minutes.  Total time on encounter, as per AMA guidelines included both the face-to-face and non-face-to-face time personally spent by the physician and/or other qualified health care professional(s) on the day of the encounter (includes time in activities that require the physician or other qualified health care professional and does not include time in activities normally performed by clinical staff). Physician's time may include the following activities when performed: Preparing to see the patient (e.g., pre-charting review of records, searching for previously ordered imaging, lab work, and nerve conduction tests) Review of prior analgesic pharmacotherapies. Reviewing PMP Interpreting ordered tests (e.g., lab work, imaging, nerve conduction tests) Performing post-procedure evaluations, including interpretation of diagnostic procedures Obtaining and/or reviewing separately obtained  history Performing a medically appropriate examination and/or evaluation Counseling and educating the patient/family/caregiver Ordering medications, tests, or procedures Referring and communicating with other health care professionals (when not separately reported) Documenting clinical information in the electronic or other health record Independently interpreting results (not separately reported) and communicating results to the patient/ family/caregiver Care coordination (not separately reported)  Note by: Joanna Done, MD Date: 03/23/2023; Time: 7:20 AM

## 2023-03-23 ENCOUNTER — Ambulatory Visit: Payer: 59 | Attending: Pain Medicine | Admitting: Pain Medicine

## 2023-03-23 ENCOUNTER — Encounter: Payer: Self-pay | Admitting: Pain Medicine

## 2023-03-23 VITALS — BP 97/79 | HR 100 | Temp 97.6°F | Ht 62.0 in | Wt 117.0 lb

## 2023-03-23 DIAGNOSIS — M546 Pain in thoracic spine: Secondary | ICD-10-CM | POA: Diagnosis present

## 2023-03-23 DIAGNOSIS — Z7901 Long term (current) use of anticoagulants: Secondary | ICD-10-CM | POA: Insufficient documentation

## 2023-03-23 DIAGNOSIS — G8929 Other chronic pain: Secondary | ICD-10-CM | POA: Diagnosis present

## 2023-03-23 DIAGNOSIS — M5124 Other intervertebral disc displacement, thoracic region: Secondary | ICD-10-CM | POA: Diagnosis not present

## 2023-03-23 DIAGNOSIS — M5134 Other intervertebral disc degeneration, thoracic region: Secondary | ICD-10-CM | POA: Diagnosis not present

## 2023-03-23 DIAGNOSIS — Z09 Encounter for follow-up examination after completed treatment for conditions other than malignant neoplasm: Secondary | ICD-10-CM | POA: Diagnosis present

## 2023-03-23 NOTE — Patient Instructions (Addendum)
______________________________________________________________________    OTC Supplements: The following over-the-counter (OTC) supplements may be of some benefits when used in moderation in some chronic pain conditions. Note: Always consult with your primary care provider and/or pharmacist before taking any OTC medications to make sure there are no "drug-to-drug" interactions with the medications you currently take. Ask your physician which may be beneficial for your particular condition.  Supplement Possible benefit May be of benefit in treatment of   Turmeric/curcumin* anti-inflammatory Joint and muscle aches and pain associated with arthritis and inflammation  Glucosamine/chondroitin (triple strength)* may slow loss of articular cartilage Osteoarthritis  Vitamin D-3* may suppress release of chemicals associated with inflammation Joint and muscle aches and pain associated with arthritis and inflammation  Moringa* anti-inflammatory with mild analgesic effects Joint and muscle aches and pain associated with arthritis and inflammation  Melatonin* Helps reset sleep cycle. Insomnia. May also be helpful in neurodegenerative disorders  Vitamin B-12* may help keep nerves and blood cells healthy as well as maintaining function of nervous system Neuropathies. Nerve pain (Burning pain)  Alpha-Lipoic-Acid (ALA)* antioxidant that may help with nerve health, pain, and blocking the activation of some inflammatory chemicals Diabetic neuropathy and metabolic syndrome       (*Always use manufacturer's recommended dosage.)  ______________________________________________________________________      ______________________________________________________________________    Procedure instructions  Stop blood-thinners  Do not eat or drink fluids (other than water) for 6 hours before your procedure  No water for 2 hours before your procedure  Take your blood pressure medicine with a sip of water  Arrive 30  minutes before your appointment  If sedation is planned, bring suitable driver. Pennie Banter, Benedetto Goad, & public transportation are NOT APPROVED)  Carefully read the "Preparing for your procedure" detailed instructions  If you have questions call us at [phone]  ______________________________________________________________________      ______________________________________________________________________    Preparing for your procedure  Appointments: If you think you may not be able to keep your appointment, call 24-48 hours in advance to cancel. We need time to make it available to others.  During your procedure appointment there will be: No Prescription Refills. No disability issues to discussed. No medication changes or discussions.  Instructions: Food intake: Avoid eating anything solid for at least 8 hours prior to your procedure. Clear liquid intake: You may take clear liquids such as water up to 2 hours prior to your procedure. (No carbonated drinks. No soda.) Transportation: Unless otherwise stated by your physician, bring a driver. (Driver cannot be a Market researcher, Pharmacist, community, or any other form of public transportation.) Morning Medicines: Except for blood thinners, take all of your other morning medications with a sip of water. Make sure to take your heart and blood pressure medicines. If your blood pressure's lower number is above 100, the case will be rescheduled. Blood thinners: Make sure to stop your blood thinners as instructed.  If you take a blood thinner, but were not instructed to stop it, call our office [phone] and ask to talk to a nurse. Not stopping a blood thinner prior to certain procedures could lead to serious complications. Diabetics on insulin: Notify the staff so that you can be scheduled 1st case in the morning. If your diabetes requires high dose insulin, take only  of your normal insulin dose the morning of the procedure and notify the staff that you have done  so. Preventing infections: Shower with an antibacterial soap the morning of your procedure.  Build-up your immune system: Take 1000  mg of Vitamin C with every meal (3 times a day) the day prior to your procedure. Antibiotics: Inform the nursing staff if you are taking any antibiotics or if you have any conditions that may require antibiotics prior to procedures. (Example: recent joint implants)   Pregnancy: If you are pregnant make sure to notify the nursing staff. Not doing so may result in injury to the fetus, including death.  Sickness: If you have a cold, fever, or any active infections, call and cancel or reschedule your procedure. Receiving steroids while having an infection may result in complications. Arrival: You must be in the facility at least 30 minutes prior to your scheduled procedure. Tardiness: Your scheduled time is also the cutoff time. If you do not arrive at least 15 minutes prior to your procedure, you will be rescheduled.  Children: Do not bring any children with you. Make arrangements to keep them home. Dress appropriately: There is always a possibility that your clothing may get soiled. Avoid long dresses. Valuables: Do not bring any jewelry or valuables.  Reasons to call and reschedule or cancel your procedure: (Following these recommendations will minimize the risk of a serious complication.) Surgeries: Avoid having procedures within 2 weeks of any surgery. (Avoid for 2 weeks before or after any surgery). Flu Shots: Avoid having procedures within 2 weeks of a flu shots or . (Avoid for 2 weeks before or after immunizations). Barium: Avoid having a procedure within 7-10 days after having had a radiological study involving the use of radiological contrast. (Myelograms, Barium swallow or enema study). Heart attacks: Avoid any elective procedures or surgeries for the initial 6 months after a "Myocardial Infarction" (Heart Attack). Blood thinners: It is imperative that you stop  these medications before procedures. Let us know if you if you take any blood thinner.  Infection: Avoid procedures during or within two weeks of an infection (including chest colds or gastrointestinal problems). Symptoms associated with infections include: Localized redness, fever, chills, night sweats or profuse sweating, burning sensation when voiding, cough, congestion, stuffiness, runny nose, sore throat, diarrhea, nausea, vomiting, cold or Flu symptoms, recent or current infections. It is specially important if the infection is over the area that we intend to treat. Heart and lung problems: Symptoms that may suggest an active cardiopulmonary problem include: cough, chest pain, breathing difficulties or shortness of breath, dizziness, ankle swelling, uncontrolled high or unusually low blood pressure, and/or palpitations. If you are experiencing any of these symptoms, cancel your procedure and contact your primary care physician for an evaluation.  Remember:  Regular Business hours are:  Monday to Thursday 8:00 AM to 4:00 PM  Provider's Schedule: Delano Metz, MD:  Procedure days: Tuesday and Thursday 7:30 AM to 4:00 PM  Edward Jolly, MD:  Procedure days: Monday and Wednesday 7:30 AM to 4:00 PM Last  Updated: 02/08/2023 ______________________________________________________________________      ______________________________________________________________________    General Risks and Possible Complications  Patient Responsibilities: It is important that you read this as it is part of your informed consent. It is our duty to inform you of the risks and possible complications associated with treatments offered to you. It is your responsibility as a patient to read this and to ask questions about anything that is not clear or that you believe was not covered in this document.  Patient's Rights: You have the right to refuse treatment. You also have the right to change your mind, even  after initially having agreed to have the treatment done.  However, under this last option, if you wait until the last second to change your mind, you may be charged for the materials used up to that point.  Introduction: Medicine is not an Visual merchandiser. Everything in Medicine, including the lack of treatment(s), carries the potential for danger, harm, or loss (which is by definition: Risk). In Medicine, a complication is a secondary problem, condition, or disease that can aggravate an already existing one. All treatments carry the risk of possible complications. The fact that a side effects or complications occurs, does not imply that the treatment was conducted incorrectly. It must be clearly understood that these can happen even when everything is done following the highest safety standards.  No treatment: You can choose not to proceed with the proposed treatment alternative. The "PRO(s)" would include: avoiding the risk of complications associated with the therapy. The "CON(s)" would include: not getting any of the treatment benefits. These benefits fall under one of three categories: diagnostic; therapeutic; and/or palliative. Diagnostic benefits include: getting information which can ultimately lead to improvement of the disease or symptom(s). Therapeutic benefits are those associated with the successful treatment of the disease. Finally, palliative benefits are those related to the decrease of the primary symptoms, without necessarily curing the condition (example: decreasing the pain from a flare-up of a chronic condition, such as incurable terminal cancer).  General Risks and Complications: These are associated to most interventional treatments. They can occur alone, or in combination. They fall under one of the following six (6) categories: no benefit or worsening of symptoms; bleeding; infection; nerve damage; allergic reactions; and/or death. No benefits or worsening of symptoms: In Medicine there  are no guarantees, only probabilities. No healthcare provider can ever guarantee that a medical treatment will work, they can only state the probability that it may. Furthermore, there is always the possibility that the condition may worsen, either directly, or indirectly, as a consequence of the treatment. Bleeding: This is more common if the patient is taking a blood thinner, either prescription or over the counter (example: Goody Powders, Fish oil, Aspirin, Garlic, etc.), or if suffering a condition associated with impaired coagulation (example: Hemophilia, cirrhosis of the liver, low platelet counts, etc.). However, even if you do not have one on these, it can still happen. If you have any of these conditions, or take one of these drugs, make sure to notify your treating physician. Infection: This is more common in patients with a compromised immune system, either due to disease (example: diabetes, cancer, human immunodeficiency virus [HIV], etc.), or due to medications or treatments (example: therapies used to treat cancer and rheumatological diseases). However, even if you do not have one on these, it can still happen. If you have any of these conditions, or take one of these drugs, make sure to notify your treating physician. Nerve Damage: This is more common when the treatment is an invasive one, but it can also happen with the use of medications, such as those used in the treatment of cancer. The damage can occur to small secondary nerves, or to large primary ones, such as those in the spinal cord and brain. This damage may be temporary or permanent and it may lead to impairments that can range from temporary numbness to permanent paralysis and/or brain death. Allergic Reactions: Any time a substance or material comes in contact with our body, there is the possibility of an allergic reaction. These can range from a mild skin rash (contact dermatitis) to a severe systemic  reaction (anaphylactic  reaction), which can result in death. Death: In general, any medical intervention can result in death, most of the time due to an unforeseen complication. ______________________________________________________________________      ______________________________________________________________________    Blood Thinners  IMPORTANT NOTICE:  If you take any of these, make sure to notify the nursing staff.  Failure to do so may result in serious injury.  Recommended time intervals to stop and restart blood-thinners, before & after invasive procedures  Generic Name Brand Name Pre-procedure: Stop medication for this amount of time before your procedure: Post-procedure: Wait this amount of time after the procedure before restarting your medication:  Abciximab Reopro 15 days 2 hrs  Alteplase Activase 10 days 10 days  Anagrelide Agrylin    Apixaban Eliquis 3 days 6 hrs  Cilostazol Pletal 3 days 5 hrs  Clopidogrel Plavix 7-10 days 2 hrs  Dabigatran Pradaxa 5 days 6 hrs  Dalteparin Fragmin 24 hours 4 hrs  Dipyridamole Aggrenox 11days 2 hrs  Edoxaban Lixiana; Savaysa 3 days 2 hrs  Enoxaparin  Lovenox 24 hours 4 hrs  Eptifibatide Integrillin 8 hours 2 hrs  Fondaparinux  Arixtra 72 hours 12 hrs  Hydroxychloroquine Plaquenil 11 days   Prasugrel Effient 7-10 days 6 hrs  Reteplase Retavase 10 days 10 days  Rivaroxaban Xarelto 3 days 6 hrs  Ticagrelor Brilinta 5-7 days 6 hrs  Ticlopidine Ticlid 10-14 days 2 hrs  Tinzaparin Innohep 24 hours 4 hrs  Tirofiban Aggrastat 8 hours 2 hrs  Warfarin Coumadin 5 days 2 hrs   Other medications with blood-thinning effects  Product indications Generic (Brand) names Note  Cholesterol Lipitor Stop 4 days before procedure  Blood thinner (injectable) Heparin (LMW or LMWH Heparin) Stop 24 hours before procedure  Cancer Ibrutinib (Imbruvica) Stop 7 days before procedure  Malaria/Rheumatoid Hydroxychloroquine (Plaquenil) Stop 11 days before procedure   Thrombolytics  10 days before or after procedures   Over-the-counter (OTC) Products with blood-thinning effects  Product Common names Stop Time  Aspirin > 325 mg Goody Powders, Excedrin, etc. 11 days  Aspirin <= 81 mg  7 days  Fish oil  4 days  Garlic supplements  7 days  Ginkgo biloba  36 hours  Ginseng  24 hours  NSAIDs Ibuprofen, Naprosyn, etc. 3 days  Vitamin E  4 days  Stop Plavix 7 days before procedure. ______________________________________________________________________

## 2023-03-23 NOTE — Progress Notes (Signed)
Safety precautions to be maintained throughout the outpatient stay will include: orient to surroundings, keep bed in low position, maintain call bell within reach at all times, provide assistance with transfer out of bed and ambulation.  

## 2023-04-07 ENCOUNTER — Ambulatory Visit: Payer: 59 | Attending: Pain Medicine | Admitting: Pain Medicine

## 2023-04-07 ENCOUNTER — Ambulatory Visit
Admission: RE | Admit: 2023-04-07 | Discharge: 2023-04-07 | Disposition: A | Discharge status: Home / Self Care | Payer: 59 | Source: Ambulatory Visit | Attending: Pain Medicine | Admitting: Pain Medicine

## 2023-04-07 ENCOUNTER — Encounter: Payer: Self-pay | Admitting: Pain Medicine

## 2023-04-07 VITALS — BP 151/82 | HR 82 | Temp 97.2°F | Resp 16 | Ht 62.0 in | Wt 117.0 lb

## 2023-04-07 DIAGNOSIS — M5134 Other intervertebral disc degeneration, thoracic region: Secondary | ICD-10-CM | POA: Diagnosis not present

## 2023-04-07 DIAGNOSIS — Z7901 Long term (current) use of anticoagulants: Secondary | ICD-10-CM | POA: Insufficient documentation

## 2023-04-07 DIAGNOSIS — G8929 Other chronic pain: Secondary | ICD-10-CM | POA: Diagnosis not present

## 2023-04-07 DIAGNOSIS — M5124 Other intervertebral disc displacement, thoracic region: Secondary | ICD-10-CM | POA: Diagnosis not present

## 2023-04-07 DIAGNOSIS — M546 Pain in thoracic spine: Secondary | ICD-10-CM | POA: Diagnosis not present

## 2023-04-07 DIAGNOSIS — R937 Abnormal findings on diagnostic imaging of other parts of musculoskeletal system: Secondary | ICD-10-CM

## 2023-04-07 MED ORDER — LACTATED RINGERS IV SOLN: Freq: Once | INTRAVENOUS | Status: AC

## 2023-04-07 MED ORDER — SODIUM CHLORIDE (PF) 0.9 % IJ SOLN
INTRAMUSCULAR | Status: AC
  Filled 2023-04-07: qty 10

## 2023-04-07 MED ORDER — FENTANYL CITRATE (PF) 100 MCG/2ML IJ SOLN
INTRAMUSCULAR | Status: AC
  Filled 2023-04-07: qty 2

## 2023-04-07 MED ORDER — PENTAFLUOROPROP-TETRAFLUOROETH EX AERO
INHALATION_SPRAY | Freq: Once | CUTANEOUS | Status: AC
  Administered 2023-04-07: 30 via TOPICAL
  Filled 2023-04-07: qty 116

## 2023-04-07 MED ORDER — MIDAZOLAM HCL 5 MG/5ML IJ SOLN
0.5000 mg | Freq: Once | INTRAMUSCULAR | Status: AC
  Administered 2023-04-07: 2 mg via INTRAVENOUS

## 2023-04-07 MED ORDER — LIDOCAINE HCL 2 % IJ SOLN
20.0000 mL | Freq: Once | INTRAMUSCULAR | Status: AC
  Administered 2023-04-07: 400 mg

## 2023-04-07 MED ORDER — DEXAMETHASONE SODIUM PHOSPHATE 10 MG/ML IJ SOLN
INTRAMUSCULAR | Status: AC
  Filled 2023-04-07: qty 1

## 2023-04-07 MED ORDER — IOHEXOL 180 MG/ML  SOLN
INTRAMUSCULAR | Status: AC
  Filled 2023-04-07: qty 10

## 2023-04-07 MED ORDER — ROPIVACAINE HCL 2 MG/ML IJ SOLN
INTRAMUSCULAR | Status: AC
  Filled 2023-04-07: qty 20

## 2023-04-07 MED ORDER — SODIUM CHLORIDE 0.9% FLUSH
2.0000 mL | Freq: Once | INTRAVENOUS | Status: AC
  Administered 2023-04-07: 2 mL

## 2023-04-07 MED ORDER — FENTANYL CITRATE (PF) 100 MCG/2ML IJ SOLN
25.0000 ug | INTRAMUSCULAR | Status: DC | PRN
  Administered 2023-04-07: 50 ug via INTRAVENOUS

## 2023-04-07 MED ORDER — DEXAMETHASONE SODIUM PHOSPHATE 10 MG/ML IJ SOLN
10.0000 mg | Freq: Once | INTRAMUSCULAR | Status: AC
  Administered 2023-04-07: 10 mg

## 2023-04-07 MED ORDER — IOHEXOL 180 MG/ML  SOLN
10.0000 mL | Freq: Once | INTRAMUSCULAR | Status: AC
  Administered 2023-04-07: 10 mL via EPIDURAL

## 2023-04-07 MED ORDER — ROPIVACAINE HCL 2 MG/ML IJ SOLN
2.0000 mL | Freq: Once | INTRAMUSCULAR | Status: AC
  Administered 2023-04-07: 2 mL via EPIDURAL

## 2023-04-07 MED ORDER — MIDAZOLAM HCL 5 MG/5ML IJ SOLN
INTRAMUSCULAR | Status: AC
  Filled 2023-04-07: qty 5

## 2023-04-07 MED ORDER — LIDOCAINE HCL 2 % IJ SOLN
INTRAMUSCULAR | Status: AC
  Filled 2023-04-07: qty 20

## 2023-04-07 NOTE — Patient Instructions (Signed)
______________________________________________________________________    Blood Thinners  IMPORTANT NOTICE:  If you take any of these, make sure to notify the nursing staff.  Failure to do so may result in serious injury.  Recommended time intervals to stop and restart blood-thinners, before & after invasive procedures  Generic Name Brand Name Pre-procedure: Stop medication for this amount of time before your procedure: Post-procedure: Wait this amount of time after the procedure before restarting your medication:  Abciximab Reopro 15 days 2 hrs  Alteplase Activase 10 days 10 days  Anagrelide Agrylin    Apixaban Eliquis 3 days 6 hrs  Cilostazol Pletal 3 days 5 hrs  Clopidogrel Plavix 7-10 days 2 hrs  Dabigatran Pradaxa 5 days 6 hrs  Dalteparin Fragmin 24 hours 4 hrs  Dipyridamole Aggrenox 11days 2 hrs  Edoxaban Lixiana; Savaysa 3 days 2 hrs  Enoxaparin  Lovenox 24 hours 4 hrs  Eptifibatide Integrillin 8 hours 2 hrs  Fondaparinux  Arixtra 72 hours 12 hrs  Hydroxychloroquine Plaquenil 11 days   Prasugrel Effient 7-10 days 6 hrs  Reteplase Retavase 10 days 10 days  Rivaroxaban Xarelto 3 days 6 hrs  Ticagrelor Brilinta 5-7 days 6 hrs  Ticlopidine Ticlid 10-14 days 2 hrs  Tinzaparin Innohep 24 hours 4 hrs  Tirofiban Aggrastat 8 hours 2 hrs  Warfarin Coumadin 5 days 2 hrs   Other medications with blood-thinning effects  Product indications Generic (Brand) names Note  Cholesterol Lipitor Stop 4 days before procedure  Blood thinner (injectable) Heparin (LMW or LMWH Heparin) Stop 24 hours before procedure  Cancer Ibrutinib (Imbruvica) Stop 7 days before procedure  Malaria/Rheumatoid Hydroxychloroquine (Plaquenil) Stop 11 days before procedure  Thrombolytics  10 days before or after procedures   Over-the-counter (OTC) Products with blood-thinning effects  Product Common names Stop Time  Aspirin > 325 mg Goody Powders, Excedrin, etc. 11 days  Aspirin <= 81 mg  7 days  Fish oil   4 days  Garlic supplements  7 days  Ginkgo biloba  36 hours  Ginseng  24 hours  NSAIDs Ibuprofen, Naprosyn, etc. 3 days  Vitamin E  4 days   ______________________________________________________________________     ______________________________________________________________________    Post-Procedure Discharge Instructions  Instructions: Apply ice:  Purpose: This will minimize any swelling and discomfort after procedure.  When: Day of procedure, as soon as you get home. How: Fill a plastic sandwich bag with crushed ice. Cover it with a small towel and apply to injection site. How long: (15 min on, 15 min off) Apply for 15 minutes then remove x 15 minutes.  Repeat sequence on day of procedure, until you go to bed. Apply heat:  Purpose: To treat any soreness and discomfort from the procedure. When: Starting the next day after the procedure. How: Apply heat to procedure site starting the day following the procedure. How long: May continue to repeat daily, until discomfort goes away. Food intake: Start with clear liquids (like water) and advance to regular food, as tolerated.  Physical activities: Keep activities to a minimum for the first 8 hours after the procedure. After that, then as tolerated. Driving: If you have received any sedation, be responsible and do not drive. You are not allowed to drive for 24 hours after having sedation. Blood thinner: (Applies only to those taking blood thinners) You may restart your blood thinner 6 hours after your procedure. Insulin: (Applies only to Diabetic patients taking insulin) As soon as you can eat, you may resume your normal dosing schedule. Infection prevention: Keep  procedure site clean and dry. Shower daily and clean area with soap and water. Post-procedure Pain Diary: Extremely important that this be done correctly and accurately. Recorded information will be used to determine the next step in treatment. For the purpose of accuracy,  follow these rules: Evaluate only the area treated. Do not report or include pain from an untreated area. For the purpose of this evaluation, ignore all other areas of pain, except for the treated area. After your procedure, avoid taking a long nap and attempting to complete the pain diary after you wake up. Instead, set your alarm clock to go off every hour, on the hour, for the initial 8 hours after the procedure. Document the duration of the numbing medicine, and the relief you are getting from it. Do not go to sleep and attempt to complete it later. It will not be accurate. If you received sedation, it is likely that you were given a medication that may cause amnesia. Because of this, completing the diary at a later time may cause the information to be inaccurate. This information is needed to plan your care. Follow-up appointment: Keep your post-procedure follow-up evaluation appointment after the procedure (usually 2 weeks for most procedures, 6 weeks for radiofrequencies). DO NOT FORGET to bring you pain diary with you.   Expect: (What should I expect to see with my procedure?) From numbing medicine (AKA: Local Anesthetics): Numbness or decrease in pain. You may also experience some weakness, which if present, could last for the duration of the local anesthetic. Onset: Full effect within 15 minutes of injected. Duration: It will depend on the type of local anesthetic used. On the average, 1 to 8 hours.  From steroids (Applies only if steroids were used): Decrease in swelling or inflammation. Once inflammation is improved, relief of the pain will follow. Onset of benefits: Depends on the amount of swelling present. The more swelling, the longer it will take for the benefits to be seen. In some cases, up to 10 days. Duration: Steroids will stay in the system x 2 weeks. Duration of benefits will depend on multiple posibilities including persistent irritating factors. Side-effects: If present, they  may typically last 2 weeks (the duration of the steroids). Frequent: Cramps (if they occur, drink Gatorade and take over-the-counter Magnesium 450-500 mg once to twice a day); water retention with temporary weight gain; increases in blood sugar; decreased immune system response; increased appetite. Occasional: Facial flushing (red, warm cheeks); mood swings; menstrual changes. Uncommon: Long-term decrease or suppression of natural hormones; bone thinning. (These are more common with higher doses or more frequent use. This is why we prefer that our patients avoid having any injection therapies in other practices.)  Very Rare: Severe mood changes; psychosis; aseptic necrosis. From procedure: Some discomfort is to be expected once the numbing medicine wears off. This should be minimal if ice and heat are applied as instructed.  Call if: (When should I call?) You experience numbness and weakness that gets worse with time, as opposed to wearing off. New onset bowel or bladder incontinence. (Applies only to procedures done in the spine)  Emergency Numbers: Durning business hours (Monday - Thursday, 8:00 AM - 4:00 PM) (Friday, 9:00 AM - 12:00 Noon): [phone] After hours: [phone] NOTE: If you are having a problem and are unable connect with, or to talk to a provider, then go to your nearest urgent care or emergency department. If the problem is serious and urgent, please call 911. ______________________________________________________________________

## 2023-04-07 NOTE — Progress Notes (Signed)
PROVIDER NOTE: Interpretation of information contained herein should be left to medically-trained personnel. Specific patient instructions are provided elsewhere under "Patient Instructions" section of medical record. This document was created in part using STT-dictation technology, any transcriptional errors that may result from this process are unintentional.  Patient: Joanna Garcia Type: Established DOB: Nov 25, 1975 MRN: [id number] PCP: Smitty Cords, DO  Service: Procedure DOS: 04/07/2023 Setting: Ambulatory Location: Ambulatory outpatient facility Delivery: Face-to-face Provider: Oswaldo Done, MD Specialty: Interventional Pain Management Specialty designation: 09 Location: Outpatient facility Ref. Prov.: [name] *       Interventional Therapy   Primary Reason for Visit: Interventional Pain Management Treatment. CC: Back Pain (Mid left)  Procedure:           Inter-Laminar Thoracic Epidural Steroid Block/Injection  #2  Laterality:  Left Level: T6-7  Imaging: Fluoroscopic guidance Anesthesia: Local anesthesia (1-2% Lidocaine) Anxiolysis: IV Versed 3 mg Sedation: Moderate conscious sedation Fentanyl 1 mL (50 mcg).  DOS: 04/07/2023 Performed by: Oswaldo Done, MD  Purpose: Diagnostic/Therapeutic Indications: Thoracic back pain, radicular pain, with degenerative disc disease severe enough to impact quality of life or function. 1. Chronic thoracic back pain (Left)   2. DDD (degenerative disc disease), thoracic   3. 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 Abnormal MRI, thoracic spine (12/23/2022)   5. Chronic anticoagulation (Plavix)    NAS-11 Pain score:   Pre-procedure: 8 /10   Post-procedure: 0-No pain/10     Position / Prep / Materials:  Position: Prone  Prep solution: ChloraPrep (2% chlorhexidine gluconate and 70% isopropyl alcohol) Prep Area: Posterior Thoracolumbar (Upper back from shoulders  to lower lumbar region).  Materials:  Tray: Epidural Needle(s) Type: Epidural needle Gauge (G): 17 Length: Regular (3.5-in) Qty: 1  H&P (Pre-op Assessment):  Joanna Garcia is a 47 y.o. (year old), female patient, seen today for interventional treatment. She  has a past surgical history that includes No past surgeries; Esophagogastroduodenoscopy (N/A, 03/16/2019); Colonoscopy (N/A, 03/16/2019); Radiology with anesthesia (N/A, 02/09/2021); IR ANGIO INTRA EXTRACRAN SEL COM CAROTID INNOMINATE UNI L MOD SED (02/09/2021); IR INTRAVSC STENT CERV CAROTID W/EMB-PROT MOD SED (02/09/2021); IR PERCUTANEOUS ART THROMBECTOMY/INFUSION INTRACRANIAL INC DIAG ANGIO (02/09/2021); IR US Guide Vasc Access Right (02/09/2021); and RENAL ANGIOGRAPHY (Left, 01/24/2023). Joanna Garcia has a current medication list which includes the following prescription(s): amlodipine, aspirin, atorvastatin, clopidogrel, fluconazole, gabapentin, meloxicam, nystatin, ondansetron, pantoprazole, tizanidine, and losartan, and the following Facility-Administered Medications: fentanyl and lactated ringers. Her primarily concern today is the Back Pain (Mid left)  Initial Vital Signs:  Pulse/HCG Rate: 82ECG Heart Rate: 83 Temp: (!) 97.2 F (36.2 C) Resp: 16 BP: (!) 162/86 SpO2: 100 %  BMI: Estimated body mass index is 21.4 kg/m as calculated from the following:   Height as of this encounter: 5\' 2"  (1.575 m).   Weight as of this encounter: 117 lb (53.1 kg).  Risk Assessment: Allergies: Reviewed. She has No Known Allergies.  Allergy Precautions: None required Coagulopathies: Reviewed. None identified.  Blood-thinner therapy: None at this time Active Infection(s): Reviewed. None identified. Joanna Garcia is afebrile  Site Confirmation: Joanna Garcia was asked to confirm the procedure and laterality before marking the site Procedure checklist: Completed Consent: Before the procedure and under the influence of no sedative(s), amnesic(s), or anxiolytics,  the patient was informed of the treatment options, risks and possible complications. To fulfill our ethical and legal obligations, as recommended by the American Medical Association's Code of Ethics, I have informed the  patient of my clinical impression; the nature and purpose of the treatment or procedure; the risks, benefits, and possible complications of the intervention; the alternatives, including doing nothing; the risk(s) and benefit(s) of the alternative treatment(s) or procedure(s); and the risk(s) and benefit(s) of doing nothing. The patient was provided information about the general risks and possible complications associated with the procedure. These may include, but are not limited to: failure to achieve desired goals, infection, bleeding, organ or nerve damage, allergic reactions, paralysis, and death. In addition, the patient was informed of those risks and complications associated to Spine-related procedures, such as failure to decrease pain; infection (i.e.: Meningitis, epidural or intraspinal abscess); bleeding (i.e.: epidural hematoma, subarachnoid hemorrhage, or any other type of intraspinal or peri-dural bleeding); organ or nerve damage (i.e.: Any type of peripheral nerve, nerve root, or spinal cord injury) with subsequent damage to sensory, motor, and/or autonomic systems, resulting in permanent pain, numbness, and/or weakness of one or several areas of the body; allergic reactions; (i.e.: anaphylactic reaction); and/or death. Furthermore, the patient was informed of those risks and complications associated with the medications. These include, but are not limited to: allergic reactions (i.e.: anaphylactic or anaphylactoid reaction(s)); adrenal axis suppression; blood sugar elevation that in diabetics may result in ketoacidosis or comma; water retention that in patients with history of congestive heart failure may result in shortness of breath, pulmonary edema, and decompensation with  resultant heart failure; weight gain; swelling or edema; medication-induced neural toxicity; particulate matter embolism and blood vessel occlusion with resultant organ, and/or nervous system infarction; and/or aseptic necrosis of one or more joints. Finally, the patient was informed that Medicine is not an exact science; therefore, there is also the possibility of unforeseen or unpredictable risks and/or possible complications that may result in a catastrophic outcome. The patient indicated having understood very clearly. We have given the patient no guarantees and we have made no promises. Enough time was given to the patient to ask questions, all of which were answered to the patient's satisfaction. Ms. Orbach has indicated that she wanted to continue with the procedure. Attestation: I, the ordering provider, attest that I have discussed with the patient the benefits, risks, side-effects, alternatives, likelihood of achieving goals, and potential problems during recovery for the procedure that I have provided informed consent. Date  Time: 04/07/2023 10:05 AM   Pre-Procedure Preparation:  Monitoring: As per clinic protocol. Respiration, ETCO2, SpO2, BP, heart rate and rhythm monitor placed and checked for adequate function Safety Precautions: Patient was assessed for positional comfort and pressure points before starting the procedure. Time-out: I initiated and conducted the "Time-out" before starting the procedure, as per protocol. The patient was asked to participate by confirming the accuracy of the "Time Out" information. Verification of the correct person, site, and procedure were performed and confirmed by me, the nursing staff, and the patient. "Time-out" conducted as per Joint Commission's Universal Protocol (UP.01.01.01). Time: 1203 Start Time: 1203 hrs.  Description of Procedure:          Target Area: For Epidural Steroid injection(s), the target area is the  interlaminar space, initially  targeting the lower border of the superior vertebral body lamina. Approach: Interlaminar approach. Area Prepped: Entire Posterior Thoracolumbar Region ChloraPrep (2% chlorhexidine gluconate and 70% isopropyl alcohol) Safety Precautions: Aspiration looking for blood return was conducted prior to all injections. At no point did we inject any substances, as a needle was being advanced. No attempts were made at seeking any paresthesias. Safe injection practices and  needle disposal techniques used. Medications properly checked for expiration dates. SDV (single dose vial) medications used. Description of the Procedure: Protocol guidelines were followed. The patient was placed in position over the fluoroscopy table. The target area was identified and the area prepped in the usual manner. Skin & deeper tissues infiltrated with local anesthetic. Appropriate amount of time allowed to pass for local anesthetics to take effect. The procedure needles were then advanced to the target area. The inferior aspect of the superior lamina was contacted and the needle walked caudad, until the lamina was cleared. The epidural space was identified using "loss-of-resistance technique" with 0.9% PF-NSS (2-32mL), in a low friction 10cc LOR glass syringe. Proper needle placement was secured. Negative aspiration confirmed. Solution injected in intermittent fashion, asking for systemic symptoms every 0.5 cc of injectate. The needles were then removed and the area cleansed, making sure to leave some of the prepping solution behind to take advantage of its long term bactericidal properties. Vitals:   04/07/23 1210 04/07/23 1211 04/07/23 1221 04/07/23 1231  BP: (!) 145/70 (!) 160/82 (!) 148/80 (!) 151/82  Pulse:      Resp: 17 15 16 16   Temp:      SpO2: 100% 100% 99% 99%  Weight:      Height:        Start Time: 1203 hrs. End Time: 1210 hrs. Imaging Guidance (Spinal):          Type of Imaging Technique: Fluoroscopy Guidance  (Spinal) Indication(s): Assistance in needle guidance and placement for procedures requiring needle placement in or near specific anatomical locations not easily accessible without such assistance. Exposure Time: Please see nurses notes. Contrast: Before injecting any contrast, we confirmed that the patient did not have an allergy to iodine, shellfish, or radiological contrast. Once satisfactory needle placement was completed at the desired level, radiological contrast was injected. Contrast injected under live fluoroscopy. No contrast complications. See chart for type and volume of contrast used. Fluoroscopic Guidance: I was personally present during the use of fluoroscopy. "Tunnel Vision Technique" used to obtain the best possible view of the target area. Parallax error corrected before commencing the procedure. "Direction-depth-direction" technique used to introduce the needle under continuous pulsed fluoroscopy. Once target was reached, antero-posterior, oblique, and lateral fluoroscopic projection used confirm needle placement in all planes. Images permanently stored in EMR. Interpretation: I personally interpreted the imaging intraoperatively. Adequate needle placement confirmed in multiple planes. Appropriate spread of contrast into desired area was observed. No evidence of afferent or efferent intravascular uptake. No intrathecal or subarachnoid spread observed. Permanent images saved into the patient's record.  Antibiotic Prophylaxis:   Anti-infectives (From admission, onward)    None      Indication(s): None identified  Post-operative Assessment:  Post-procedure Vital Signs:  Pulse/HCG Rate: 8281 Temp: (!) 97.2 F (36.2 C) Resp: 16 BP: (!) 151/82 SpO2: 99 %  EBL: None  Complications: No immediate post-treatment complications observed by team, or reported by patient.  Note: The patient tolerated the entire procedure well. A repeat set of vitals were taken after the procedure  and the patient was kept under observation following institutional policy, for this type of procedure. Post-procedural neurological assessment was performed, showing return to baseline, prior to discharge. The patient was provided with post-procedure discharge instructions, including a section on how to identify potential problems. Should any problems arise concerning this procedure, the patient was given instructions to immediately contact us, at any time, without hesitation. In any case, we plan to contact  the patient by telephone for a follow-up status report regarding this interventional procedure.  Comments:  No additional relevant information.  Plan of Care (POC)  Orders:  Orders Placed This Encounter  Procedures   Thoracic Epidural Injection    Scheduling Instructions:     Side: Left-sided     Sedation: With Sedation.     Timeframe: Today    Order Specific Question:   Where will this procedure be performed?    Answer:   [facility]   DG [facility] C-ARM 1-60 MIN NO REPORT    Intraoperative interpretation by procedural physician at [facility].    Standing Status:   Standing    Number of Occurrences:   1    Order Specific Question:   Reason for exam:    Answer:   Assistance in needle guidance and placement for procedures requiring needle placement in or near specific anatomical locations not easily accessible without such assistance.   Informed Consent Details: Physician/Practitioner Attestation; Transcribe to consent form and obtain patient signature    Provider Attestation: I, Korie Brabson A. Laban Emperor, MD, (Pain Management Specialist), the physician/practitioner, attest that I have discussed with the patient the benefits, risks, side effects, alternatives, likelihood of achieving goals and potential problems during recovery for the procedure that I have provided informed consent.    Scheduling Instructions:     Nursing Order: Transcribe to consent form and obtain  patient signature.     Note: Always confirm laterality of pain with Ms. Mullins, before procedure.    Order Specific Question:   Physician/Practitioner attestation of informed consent for procedure/surgical case    Answer:   I, the physician/practitioner, attest that I have discussed with the patient the benefits, risks, side effects, alternatives, likelihood of achieving goals and potential problems during recovery for the procedure that I have provided informed consent.    Order Specific Question:   Procedure    Answer:   Thoracic Epidural Steroid Injection/Block under fluoroscopic guidance    Order Specific Question:   Physician/Practitioner performing the procedure    Answer:   Mendell Bontempo A. Laban Emperor MD    Order Specific Question:   Indication/Reason    Answer:   Upper (Thoracic) Back Pain with or without Thoracic Radicular Pain (Rib/Flank pain) secondary to Thoracic Degenerative Disc Disease and/or Thoracic Intervertebral Disc Displacement, with or without Thoracic Radiculitis/Radiculopathy.   Care order/instruction: Please confirm that the patient has stopped the Plavix (Clopidogrel) x 7-10 days prior to procedure or surgery.    Please confirm that the patient has stopped the Plavix (Clopidogrel) x 7-10 days prior to procedure or surgery.    Standing Status:   Standing    Number of Occurrences:   1   Provide equipment / supplies at bedside    Procedural tray: Epidural Tray (Disposable  single use) Skin infiltration needle: Regular 1.5-in, 25-G, (x1) Block needle size: Regular standard Catheter: No catheter required    Standing Status:   Standing    Number of Occurrences:   1    Order Specific Question:   Specify    Answer:   Epidural Tray   Bleeding precautions    Standing Status:   Standing    Number of Occurrences:   1   Chronic Opioid Analgesic:  None MME/day: 0 mg/day   Medications ordered for procedure: Meds ordered this encounter  Medications   lidocaine (XYLOCAINE) 2 %  (with pres) injection 400 mg   pentafluoroprop-tetrafluoroeth (GEBAUERS) aerosol   iohexol (OMNIPAQUE) 180  MG/ML injection 10 mL    Must be Myelogram-compatible. If not available, you may substitute with a water-soluble, non-ionic, hypoallergenic, myelogram-compatible radiological contrast medium.   lactated ringers infusion   midazolam (VERSED) 5 MG/5ML injection 0.5-2 mg    Make sure Flumazenil is available in the pyxis when using this medication. If oversedation occurs, administer 0.2 mg IV over 15 sec. If after 45 sec no response, administer 0.2 mg again over 1 min; may repeat at 1 min intervals; not to exceed 4 doses (1 mg)   fentaNYL (SUBLIMAZE) injection 25-50 mcg    Make sure Narcan is available in the pyxis when using this medication. In the event of respiratory depression (RR< 8/min): Titrate NARCAN (naloxone) in increments of 0.1 to 0.2 mg IV at 2-3 minute intervals, until desired degree of reversal.   sodium chloride flush (NS) 0.9 % injection 2 mL   ropivacaine (PF) 2 mg/mL (0.2%) (NAROPIN) injection 2 mL   dexamethasone (DECADRON) injection 10 mg   Medications administered: We administered lidocaine, pentafluoroprop-tetrafluoroeth, iohexol, lactated ringers, midazolam, fentaNYL, sodium chloride flush, ropivacaine (PF) 2 mg/mL (0.2%), and dexamethasone.  See the medical record for exact dosing, route, and time of administration.  Follow-up plan:   Return in about 2 weeks (around 04/21/2023) for (Face2F), (PPE).       Interventional Therapies  Risk Factors  Considerations  Medical Comorbidities:  Plavix Anticoagulation: (Stop: 7-10 days  Restart: 2 hours)    Planned  Pending:   Diagnostic/therapeutic left T6-7 thoracic ESI #2    Under consideration:   Diagnostic/therapeutic left T6-7 thoracic ESI #2  Diagnostic left thoracic facet MBB #1    Completed:   Diagnostic/therapeutic left T6-7 thoracic ESI x1 (02/24/2023) (100/100/90 x 3 days/0)    Therapeutic  Palliative  (PRN) options:   None established   Completed by other providers:   Therapeutic left thoracic dry needling  Therapeutic chiropractic manipulations        Recent Visits Date Type Provider Dept  03/23/23 Office Visit Delano Metz, MD [facility]  02/24/23 Procedure visit Delano Metz, MD [facility]  02/02/23 Office Visit Delano Metz, MD [facility]  Showing recent visits within past 90 days and meeting all other requirements Today's Visits Date Type Provider Dept  04/07/23 Procedure visit Delano Metz, MD [facility]  Showing today's visits and meeting all other requirements Future Appointments No visits were found meeting these conditions. Showing future appointments within next 90 days and meeting all other requirements  Disposition: Discharge home  Discharge (Date  Time): 04/07/2023; 1244 hrs.   Primary Care Physician: Smitty Cords, DO Location: [facility] Note by: Oswaldo Done, MD (TTS technology used. I apologize for any typographical errors that were not detected and corrected.) Date: 04/07/2023; Time: 1:01 PM  Disclaimer:  Medicine is not an Visual merchandiser. The only guarantee in medicine is that nothing is guaranteed. It is important to note that the decision to proceed with this intervention was based on the information collected from the patient. The Data and conclusions were drawn from the patient's questionnaire, the interview, and the physical examination. Because the information was provided in large part by the patient, it cannot be guaranteed that it has not been purposely or unconsciously manipulated. Every effort has been made to obtain as much relevant data as possible for this evaluation. It is important to note that the conclusions that lead to this procedure are derived in large part from the available  data. Always take into account that the treatment  will also be dependent on availability of resources and existing treatment guidelines, considered by other Pain Management Practitioners as being common knowledge and practice, at the time of the intervention. For Medico-Legal purposes, it is also important to point out that variation in procedural techniques and pharmacological choices are the acceptable norm. The indications, contraindications, technique, and results of the above procedure should only be interpreted and judged by a Board-Certified Interventional Pain Specialist with extensive familiarity and expertise in the same exact procedure and technique.

## 2023-04-07 NOTE — Progress Notes (Signed)
Safety precautions to be maintained throughout the outpatient stay will include: orient to surroundings, keep bed in low position, maintain call bell within reach at all times, provide assistance with transfer out of bed and ambulation.  

## 2023-04-08 ENCOUNTER — Telehealth: Payer: Self-pay

## 2023-04-08 NOTE — Telephone Encounter (Signed)
Post procedure follow up.  Patient states she is doing well.   ?

## 2023-04-19 NOTE — Progress Notes (Unsigned)
Cardiology Office Note:    Date:  04/20/2023  ID:  JAELYNN COLOM, DOB 1976-05-25, MRN [id number] PCP: Smitty Cords, DO  [facility] Providers Cardiologist:  Debbe Odea, MD       Patient Profile:      Hypertension Hyperlipidemia Tobacco use CVA Alcohol abuse Polycythemia Aortic atherosclerosis       History of Present Illness:   Joanna Garcia is a 47 y.o. female who returns for post hospital admission on 01/22/23 for hypertensive emergency with demand ischemia .   In 01/2021 she presented to [facility] with stroke, symptoms of sudden onset left sided weakness. She was dx with Rt MCA and MCA/ACA infarct, punctate scattered infarcts and treated initially with tPA, secondary to large vessel disease with right ICA/CCA thrombus due to atherosclerosis and polycythemia. Neuro Interventional Radiology was consulted that performed cerebral angiogram with intervention - with stenting of the right cervical ICA. She has a known occlusion of left ICA.   She was admitted on 01/22/2023 for hypertensive emergency and elevated troponins.  Of note a week prior to admission she had been experiencing blood pressures over 200 systolic, headaches, ringing in her ears, and palpitations.  On 01/22/2023 she had had some chest tightness with radiation to her left arm.  Prior to her admission she had been taking lisinopril 10 mg but her PCP took her off and placed her on HCTZ 12.5 mg due to lisinopril lowing her blood pressure to 100 systolic.  On arrival to the ED her blood pressure was 205/107 with a heart rate of 114.  Noncontrast head CT showed no evidence of acute intracranial hemorrhage.  High-sensitivity troponins trended 27, 109, 92, 49.  Her serum potassium was 2.8.  IV heparin was held due to likely demand ischemia from hypertensive urgency.  Blood pressure improved after IV labetalol in the ED.  Echocardiogram was performed showing 55 to 60% LVEF, LV normal function, no RWMA, LV diastolic  parameters were normal, RV function normal, mild mitral valve regurgitation.  Renal Dopplers were performed showing mildly elevated velocity of the origin of the left main renal artery suspicious for stenosis.  Vascular was consulted who performed left renal artery angiogram on 01/24/2023, where a stent was placed in the left renal artery after confirmed 70 to 80% proximal left renal artery stenosis.  She was discharged on 01/25/2023 with plan to have outpatient coronary CTA.  She was discharged home on amlodipine 5 mg and losartan 25 mg.  On 02/02/2023  coronary CTA showed calcium score of 131, mild nonobstructive coronary artery disease, mild proximal RCA stenosis (25-49%), minimal LAD and LCx stenosis (less than 25%).  On 02/08/2023 she followed up with her PCP who noted her blood pressure to be 152/78 her losartan was increased from 25 mg to 50 mg and amlodipine 5 mg was continued.  Today:  Patient notes that she has not taken her antihypertensive medications over the last 5 days.  She tells me that her blood pressures at home have been in the upper 80s to low 90s systolic since her PCP increased her losartan from 25 mg to 50 mg.  She notes prior to this medication change since she was discharged from the hospital her blood pressures were more stable.  She notes when she became hypotensive she experienced nausea, fatigue, weakness, dizziness and lightheadedness.  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  She does not take  her blood pressure before taking her antihypertensives, only when she started feeling symptomatic which she noted was about every day since her medication change, notably just within an hour after she takes her medication.  Since she stopped her antihypertensive medications she notes that her symptoms have improved.  However her blood pressures are now running in the 170s consistently.  She notes headache today and continued  fatigue.  She denies chest pain, shortness of breath, palpitations. We reviewed her cardiac CTA, renal ultrasound, and echocardiogram results today.          Review of Systems  Constitutional: Negative for weight gain and weight loss.  Cardiovascular:  Negative for chest pain, claudication, cyanosis, dyspnea on exertion, irregular heartbeat, leg swelling, near-syncope, orthopnea, palpitations, paroxysmal nocturnal dyspnea and syncope.  Respiratory:  Negative for cough, hemoptysis and shortness of breath.   Gastrointestinal:  Negative for abdominal pain, hematochezia and melena.  Genitourinary:  Negative for hematuria.  Neurological:  Positive for headaches. Negative for dizziness and light-headedness.     See HPI     Studies Reviewed:   EKG Interpretation Date/Time:  Wednesday April 20 2023 08:22:27 EDT Ventricular Rate:  105 PR Interval:  150 QRS Duration:  86 QT Interval:  352 QTC Calculation: 465 R Axis:   84  Text Interpretation: Sinus tachycardia No significant change was found Confirmed by Rise Paganini [phone]) on 04/20/2023 9:32:57 AM   Cardiac CTA 02/02/23 IMPRESSION: 1. Coronary calcium score of 131. This was 99th percentile for age and sex matched control.   2. Normal coronary origin with right dominance.   3. Mild proximal RCA stenosis (25-49%).   4. Minimal LAD and LCx stenosis (<25%).   5. CAD-RADS 2. Mild non-obstructive CAD (25-49%). Consider non-atherosclerotic causes of chest pain. Consider preventive therapy and risk factor modification.  Echo 01/23/23  1. Left ventricular ejection fraction, by estimation, is 55 to 60%. The  left ventricle has normal function. The left ventricle has no regional  wall motion abnormalities. Left ventricular diastolic parameters were  normal. The average left ventricular  global longitudinal strain is -16.7 %. The global longitudinal strain is  normal.   2. Right ventricular systolic function is normal. The right  ventricular  size is normal.   3. The mitral valve is normal in structure. Mild mitral valve  regurgitation.   4. The aortic valve was not well visualized. Aortic valve regurgitation  is not visualized. Aortic valve sclerosis is present, with no evidence of  aortic valve stenosis.   5. The inferior vena cava is normal in size with greater than 50%  respiratory variability, suggesting right atrial pressure of 3 mmHg.  Risk Assessment/Calculations:     HYPERTENSION CONTROL Vitals:   04/20/23 0814 04/20/23 0826 04/20/23 0850  BP: (!) 177/108 (!) 178/110 (!) 162/100    The patient's blood pressure is elevated above target today.  In order to address the patient's elevated BP: A current anti-hypertensive medication was adjusted today.          Physical Exam:   VS:  BP (!) 162/100 (BP Location: Left Arm, Patient Position: Sitting)   Pulse (!) 105   Ht 5\' 2"  (1.575 m)   Wt 115 lb 9.6 oz (52.4 kg)   SpO2 98%   BMI 21.14 kg/m    Wt Readings from Last 3 Encounters:  04/20/23 115 lb 9.6 oz (52.4 kg)  04/07/23 117 lb (53.1 kg)  03/23/23 117 lb (53.1 kg)    Constitutional:  Appearance: Normal and healthy appearance.  Neck:     Vascular: JVD normal.  Pulmonary:     Effort: Pulmonary effort is normal.     Breath sounds: Normal breath sounds.  Chest:     Chest wall: Not tender to palpatation.  Cardiovascular:     PMI at left midclavicular line. Normal rate. Regular rhythm. Normal S1. Normal S2.      Murmurs: There is no murmur.     No gallop.  No click. No rub.  Pulses:    Intact distal pulses.  Edema:    Peripheral edema absent.  Musculoskeletal: Normal range of motion.     Cervical back: Normal range of motion and neck supple. Skin:    General: Skin is warm and dry.  Neurological:     General: No focal deficit present.     Mental Status: Alert and oriented to person, place and time.  Psychiatric:        Behavior: Behavior is cooperative.        Assessment and  Plan:  Hypertension -BP 177/107, repeat 162/100 -S/p left renal angiogram and stenting to left renal artery 01/24/23 -She has not taken her antihypertensives in 5 days due to hypotension (80s systolic) since her losartan was increased (25-->50) -Plan to decrease losartan from 50 mg to 25 mg -Continue amlodipine 5 mg -She is to begin back on her antihypertensive therapy starting today -Plan follow-up visit x 2 weeks for blood pressure recheck -Log blood pressures over the next 2 weeks and bring into office  Nonobstructive CAD -Elevated troponins on admission likely due to demand ischemia from hypertensive emergency -Cardiac CTA 02/02/2023 mild nonobstructive CAD, coronary calcium score 131 -No chest pain, no ischemic evaluation is needed -EKG today sinus tachycardia, 105 bpm, no ST/T wave changes -Encourage aggressive lifestyle modification -Continue atorvastatin, aspirin  Left renal artery stenosis s/p stent -s/p left renal artery stent on 01/24/2023 by Dr. Wyn Quaker -Clopidogrel 75mg  managed by vascular surgery  -Encouraged patient to schedule follow-up visit -Start losartan 25 mg -Continue aspirin 81 mg  Hyperlipidemia  -LDL 96 on 01/22/23 -With history of CVA, coronary calcium score of >100, goal be set to less than 70 -Plan to start ezetimibe 10 mg -Continue atorvastatin 80 mg -Encouraged tobacco cessation -Heart healthy dieting, daily physical exercise  Tobacco abuse -She continues to smoke, would like to stop -She has nicotine patches at home -Spoke about dangers of cigarette smoking and effects on CVD -Cessation highly encouraged                 Dispo:  Return in about 2 weeks (around 05/04/2023).  Signed, Denyce Robert, AGNP-C

## 2023-04-20 ENCOUNTER — Encounter: Payer: Self-pay | Admitting: Physician Assistant

## 2023-04-20 ENCOUNTER — Encounter: Payer: Self-pay | Admitting: Emergency Medicine

## 2023-04-20 ENCOUNTER — Ambulatory Visit: Payer: 59 | Attending: Physician Assistant | Admitting: Emergency Medicine

## 2023-04-20 VITALS — BP 162/100 | HR 105 | Ht 62.0 in | Wt 115.6 lb

## 2023-04-20 DIAGNOSIS — Z720 Tobacco use: Secondary | ICD-10-CM

## 2023-04-20 DIAGNOSIS — E782 Mixed hyperlipidemia: Secondary | ICD-10-CM

## 2023-04-20 DIAGNOSIS — I214 Non-ST elevation (NSTEMI) myocardial infarction: Secondary | ICD-10-CM

## 2023-04-20 DIAGNOSIS — I701 Atherosclerosis of renal artery: Secondary | ICD-10-CM | POA: Diagnosis not present

## 2023-04-20 DIAGNOSIS — I10 Essential (primary) hypertension: Secondary | ICD-10-CM

## 2023-04-20 DIAGNOSIS — I2510 Atherosclerotic heart disease of native coronary artery without angina pectoris: Secondary | ICD-10-CM

## 2023-04-20 MED ORDER — EZETIMIBE 10 MG PO TABS: 10.0000 mg | ORAL_TABLET | Freq: Every day | ORAL | 3 refills | Status: DC

## 2023-04-20 MED ORDER — LOSARTAN POTASSIUM 25 MG PO TABS: 25.0000 mg | ORAL_TABLET | Freq: Every day | ORAL | 3 refills | Status: DC

## 2023-04-20 NOTE — Patient Instructions (Addendum)
Medication Instructions:  Your physician recommends the following medication changes.  CONTINUE: Amlodipine 5 mg daily (take when you get home)   START TAKING: Zetia 10 mg daily  DECREASE: Losartan 25 mg daily  *If you need a refill on your cardiac medications before your next appointment, please call your pharmacy*   Lab Work: None ordered If you have labs (blood work) drawn today and your tests are completely normal, you will receive your results only by: MyChart Message (if you have MyChart) OR A paper copy in the mail If you have any lab test that is abnormal or we need to change your treatment, we will call you to review the results.   Testing/Procedures: None ordered  The patient was counseled on the dangers of tobacco use, and was advised to quit and referred to a tobacco cessation program.  Reviewed strategies to maximize success, including removing cigarettes and smoking materials from environment, stress management, substitution of other forms of reinforcement, and support of family/friends.  Dear Joanna Garcia,   Congratulations for your interest in quitting smoking!  Find a program that suits you best: when you want to quit, how you need support, where you live, and how you like to learn.    If you're ready to get started TODAY, consider scheduling a visit through [email or web address].  Appointments are available from 8am to 8pm, every day of the year.   Most health insurance plans will cover some level of tobacco cessation visits and medications.    Additional Resources: [employer] are also available to help you quit & provide the support you'll need. Many programs are available in both [place] and Spanish and have a long history of successfully helping people get off and stay off tobacco.    Quit Smoking Apps:  quitSTART at [employer].de QuitGuide?at [employer].dk Online education and  resources: Smokefree  at [employer].gov Free Telephone Coaching: QuitNow,  Call 1-800-QUIT-NOW (951-595-1754) or Text- Ready to 541 208 5009 *Quitline [place] has teamed up with Medicaid to offer a free 14 week program    Vaping- Want to Quit? Free 24/7 support. Call 1-800-QUIT-NOW    Follow-Up: At [facility], you and your health needs are our priority.  As part of our continuing mission to provide you with exceptional heart care, we have created designated Provider Care Teams.  These Care Teams include your primary Cardiologist (physician) and Advanced Practice Providers (APPs -  Physician Assistants and Nurse Practitioners) who all work together to provide you with the care you need, when you need it.  We recommend signing up for the patient portal called "MyChart".  Sign up information is provided on this After Visit Summary.  MyChart is used to connect with patients for Virtual Visits (Telemedicine).  Patients are able to view lab/test results, encounter notes, upcoming appointments, etc.  Non-urgent messages can be sent to your provider as well.   To learn more about what you can do with MyChart, go to [email or web address].    Your next appointment:   2-3 week(s) opening on 19 Nov, 24  Provider:   You may see Joanna Odea, MD or one of the following Advanced Practice Providers on your designated Care Team:   Eula Listen, [place]

## 2023-04-21 ENCOUNTER — Ambulatory Visit: Payer: 59 | Admitting: Pain Medicine

## 2023-05-01 NOTE — Progress Notes (Signed)
PROVIDER NOTE: Information contained herein reflects review and annotations entered in association with encounter. Interpretation of such information and data should be left to medically-trained personnel. Information provided to patient can be located elsewhere in the medical record under "Patient Instructions". Document created using STT-dictation technology, any transcriptional errors that may result from process are unintentional.    Patient: Joanna Garcia  Service Category: E/M  Provider: Oswaldo Done, MD  DOB: 1975-07-26  DOS: 05/04/2023  Referring Provider: [name] *  MRN: [id number]  Specialty: Interventional Pain Management  PCP: Smitty Cords, DO  Type: Established Patient  Setting: Ambulatory outpatient    Location: Office  Delivery: Face-to-face     HPI  [name], a 47 y.o. year old female, is here today because of her Chronic left-sided thoracic back pain [M54.6, G89.29]. Ms. Mcvaugh primary complain today is Back Pain (middle)  Pertinent problems: Ms. 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 Pain Assessment: Severity of Chronic pain is reported as a 8 /10. Location: Back Mid/denies. Onset: More than a month ago. Quality: Aching, Burning, Sharp, Stabbing. Timing: Constant. Modifying factor(s):  Marland Kitchen Vitals:  height is 5\' 2"  (1.575 m) and weight is 117 lb (53.1 kg). Her temporal temperature is 97.4 F (36.3 C) (abnormal). Her blood pressure is 90/67 and her pulse is 98. Her oxygen saturation is 94%.  BMI: Estimated body mass index is 21.4 kg/m as  calculated from the following:   Height as of this encounter: 5\' 2"  (1.575 m).   Weight as of this encounter: 117 lb (53.1 kg). Last encounter: 03/23/2023. Last procedure: 04/07/2023.  Reason for encounter: post-procedure evaluation and assessment.  At this point the patient has completed 2 interlaminar thoracic epidural steroid injections with no long-term benefit.  After careful review of the thoracic MRI, it is likely that she may be experiencing thoracic facet pain on the left side.  Her pain is strictly left-sided in her protrusion is right-sided so it does not go along with her anatomical location of the protrusion.  In addition, she is not having any radicular symptoms and the pain is located primarily in the back.  She denies having tried any physical therapy and therefore we will be doing a referral for that.  Will also shift the plan to doing a diagnostic left-sided thoracic facet block at about the T5-6 and T6-7 levels were her findings are primarily located.  At the time of the facet block we will perform fluoroscopic mapping to further narrow down the affected area.  If the patient does well with the confirmatory diagnostic injections, then we will move onto radiofrequency ablation.  IMPRESSION: 1. No acute osseous injury of the thoracic spine. 2. At T5-6 there is a small right paracentral disc protrusion. 3. At T6-7 there is a small central disc protrusion. 4. No foraminal or central canal stenosis of the thoracic spine.  The patient indicated having attained 100% relief of the pain for the duration of the local anesthetic, but no long-term benefit.  Having said that, further questioning revealed the patient to no longer have a radicular component around the chest area.  It would appear that the epidural steroid injection did provide him  with 100% relief of the radiculitis but not the back pain component.  Post-procedure evaluation   Inter-Laminar Thoracic Epidural Steroid  Block/Injection  #2  Laterality:  Left Level: T6-7  Imaging: Fluoroscopic guidance Anesthesia: Local anesthesia (1-2% Lidocaine) Anxiolysis: IV Versed 3 mg Sedation: Moderate conscious sedation Fentanyl 1 mL (50 mcg).  DOS: 04/07/2023 Performed by: Oswaldo Done, MD  Purpose: Diagnostic/Therapeutic Indications: Thoracic back pain, radicular pain, with degenerative disc disease severe enough to impact quality of life or function. 1. Chronic thoracic back pain (Left)   2. DDD (degenerative disc disease), thoracic   3. 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 Abnormal MRI, thoracic spine (12/23/2022)   5. Chronic anticoagulation (Plavix)    NAS-11 Pain score:   Pre-procedure: 8 /10   Post-procedure: 0-No pain/10      Effectiveness:  Initial hour after procedure: 100 %. Subsequent 4-6 hours post-procedure: 100 %. Analgesia past initial 6 hours: 0 %. Ongoing improvement:  Analgesic: The patient indicated having attained 100% relief of the pain for the duration of the local anesthetic, but no long-term benefit.  Having said that, further questioning revealed the patient to no longer have a radicular component around the chest area.  It would appear that the epidural steroid injection did provide him with 100% relief of the radiculitis but not the back pain component. Function: Somewhat improved ROM: Back to baseline   Pharmacotherapy Assessment  Analgesic: None MME/day: 0 mg/day   Monitoring: [place] PMP: PDMP reviewed during this encounter.       Pharmacotherapy: No side-effects or adverse reactions reported. Compliance: No problems identified. Effectiveness: Clinically acceptable.  Florina Ou, RN  05/04/2023  8:38 AM  Sign when Signing Visit Safety precautions to be maintained throughout the outpatient stay will include: orient to surroundings, keep bed in low position, maintain call bell within reach at all times, provide  assistance with transfer out of bed and ambulation.     No results found for: "CBDTHCR" No results found for: "D8THCCBX" No results found for: "D9THCCBX"  UDS:  Summary  Date Value Ref Range Status  02/02/2023 Note  Final    Comment:    ==================================================================== Compliance Drug Analysis, Ur ==================================================================== Test                             Result       Flag       Units  Drug Present and Declared for Prescription Verification   Gabapentin                     PRESENT      EXPECTED   Salicylate                     PRESENT      EXPECTED   Metoprolol                     PRESENT      EXPECTED  Drug Present not Declared for Prescription Verification   Carboxy-THC                    374          UNEXPECTED ng/mg creat    Carboxy-THC is a metabolite of tetrahydrocannabinol (THC). Source of    THC is most commonly herbal marijuana or marijuana-based products,    but THC is also present in  a scheduled prescription medication.    Trace amounts of THC can be present in hemp and cannabidiol (CBD)    products. This test is not intended to distinguish between delta-9-    tetrahydrocannabinol, the predominant form of THC in most herbal or    marijuana-based products, and delta-8-tetrahydrocannabinol.  Drug Absent but Declared for Prescription Verification   Tizanidine                     Not Detected UNEXPECTED    Tizanidine, as indicated in the declared medication list, is not    always detected even when used as directed.  ==================================================================== Test                      Result    Flag   Units      Ref Range   Creatinine              76               mg/dL      >=21 ==================================================================== Declared Medications:  The flagging and interpretation on this report are based on the  following declared medications.   Unexpected results may arise from  inaccuracies in the declared medications.   **Note: The testing scope of this panel includes these medications:   Gabapentin (Neurontin)  Metoprolol (Lopressor)   **Note: The testing scope of this panel does not include small to  moderate amounts of these reported medications:   Aspirin  Tizanidine (Zanaflex)   **Note: The testing scope of this panel does not include the  following reported medications:   Amlodipine (Norvasc)  Atorvastatin (Lipitor)  Clopidogrel (Plavix)  Losartan (Cozaar)  Meloxicam (Mobic)  Nicotine  Ondansetron (Zofran)  Pantoprazole (Protonix) ==================================================================== For clinical consultation, please call [phone]. ====================================================================       ROS  Constitutional: Denies any fever or chills Gastrointestinal: No reported hemesis, hematochezia, vomiting, or acute GI distress Musculoskeletal: Denies any acute onset joint swelling, redness, loss of ROM, or weakness Neurological: No reported episodes of acute onset apraxia, aphasia, dysarthria, agnosia, amnesia, paralysis, loss of coordination, or loss of consciousness  Medication Review  amLODipine, aspirin, atorvastatin, clopidogrel, ezetimibe, gabapentin, losartan, meloxicam, ondansetron, pantoprazole, and tiZANidine  History Review  Allergy: Ms. Molloy has No Known Allergies. Drug: Ms. Herriage  reports that she does not currently use drugs after having used the following drugs: Marijuana. Alcohol:  reports current alcohol use. Tobacco:  reports that she has been smoking cigarettes. She started smoking about 30 years ago. She has a 23.2 pack-year smoking history. She has never used smokeless tobacco. Social: Ms. Valesquez  reports that she has been smoking cigarettes. She started smoking about 30 years ago. She has a 23.2 pack-year smoking history. She has never used  smokeless tobacco. She reports current alcohol use. She reports that she does not currently use drugs after having used the following drugs: Marijuana. Medical:  has a past medical history of DVT (deep venous thrombosis) (HCC), GERD (gastroesophageal reflux disease), Hypertension, Polycythemia, Polyneuropathy, and Stroke (HCC). Surgical: Ms. Gim  has a past surgical history that includes No past surgeries; Esophagogastroduodenoscopy (N/A, 03/16/2019); Colonoscopy (N/A, 03/16/2019); Radiology with anesthesia (N/A, 02/09/2021); IR ANGIO INTRA EXTRACRAN SEL COM CAROTID INNOMINATE UNI L MOD SED (02/09/2021); IR INTRAVSC STENT CERV CAROTID W/EMB-PROT MOD SED (02/09/2021); IR PERCUTANEOUS ART THROMBECTOMY/INFUSION INTRACRANIAL INC DIAG ANGIO (02/09/2021); IR US Guide Vasc Access Right (02/09/2021); and RENAL ANGIOGRAPHY (Left, 01/24/2023). Family: family history includes  Breast cancer in her paternal grandmother; COPD in her mother; Cancer in her father and paternal grandmother; Heart disease in her mother; Hemochromatosis in her maternal aunt, maternal aunt, maternal uncle, and mother; Hyperlipidemia in her father and mother; Hypertension in her father and mother; Leukemia in her paternal uncle.  Laboratory Chemistry Profile   Renal Lab Results  Component Value Date   BUN 10 01/25/2023   CREATININE 0.81 01/25/2023   BCR SEE NOTE: 12/22/2022   GFRAA >60 06/05/2019   GFRNONAA >60 01/25/2023    Hepatic Lab Results  Component Value Date   AST 20 01/23/2023   ALT 12 01/23/2023   ALBUMIN 3.3 (L) 01/23/2023   ALKPHOS 66 01/23/2023    Electrolytes Lab Results  Component Value Date   NA 134 (L) 01/25/2023   K 4.2 01/25/2023   CL 101 01/25/2023   CALCIUM 8.3 (L) 01/25/2023   MG 1.9 01/22/2023    Bone No results found for: "VD25OH", "VD125OH2TOT", "GH8299BZ1", "IR6789FY1", "25OHVITD1", "25OHVITD2", "25OHVITD3", "TESTOFREE", "TESTOSTERONE"  Inflammation (CRP: Acute Phase) (ESR: Chronic Phase) Lab Results   Component Value Date   CRP <1 02/02/2023   ESRSEDRATE 2 02/02/2023   LATICACIDVEN 0.8 03/15/2019         Note: Above Lab results reviewed.  Recent Imaging Review  DG PAIN CLINIC C-ARM 1-60 MIN NO REPORT Fluoro was used, but no Radiologist interpretation will be provided.  Please refer to "NOTES" tab for provider progress note. Note: Reviewed        Physical Exam  General appearance: Well nourished, well developed, and well hydrated. In no apparent acute distress Mental status: Alert, oriented x 3 (person, place, & time)       Respiratory: No evidence of acute respiratory distress Eyes: PERLA Vitals: BP 90/67 (BP Location: Right Arm, Patient Position: Sitting, Cuff Size: Normal)   Pulse 98   Temp (!) 97.4 F (36.3 C) (Temporal)   Ht 5\' 2"  (1.575 m)   Wt 117 lb (53.1 kg)   SpO2 94%   BMI 21.40 kg/m  BMI: Estimated body mass index is 21.4 kg/m as calculated from the following:   Height as of this encounter: 5\' 2"  (1.575 m).   Weight as of this encounter: 117 lb (53.1 kg). Ideal: Ideal body weight: 50.1 kg (110 lb 7.2 oz) Adjusted ideal body weight: 51.3 kg (113 lb 1.1 oz)  Assessment   Diagnosis Status  1. Chronic thoracic back pain (Left)   2. DDD (degenerative disc disease), thoracic   3. 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 Abnormal MRI, thoracic spine (12/23/2022)   5. Chronic anticoagulation (Plavix)   6. Postop check   7. Pain of thoracic facet joint (Left)   8. Thoracic facet syndrome (Left)   9. Spondylosis without myelopathy or radiculopathy, thoracic region    Persistent Stable Stable   Updated Problems: No problems updated.   Plan of Care  Problem-specific:  No problem-specific Assessment & Plan notes found for this encounter.  Ms. KAMORAH DEMBO has a current medication list which includes the following long-term medication(s): amlodipine, atorvastatin, ezetimibe, gabapentin, losartan, and  pantoprazole.  Pharmacotherapy (Medications Ordered): No orders of the defined types were placed in this encounter.  Orders:  Orders Placed This Encounter  Procedures   THORACIC FACET BLOCK    Standing Status:   Future    Standing Expiration Date:   08/04/2023    Scheduling Instructions:     Thoracic Medial Branch Block  Side: Left-sided     Sedation: With Sedation.     Timeframe: ASAP    Order Specific Question:   Where will this procedure be performed?    Answer:   [facility]   Ambulatory referral to Physical Therapy    Referral Priority:   Routine    Referral Type:   Physical Medicine    Referral Reason:   Specialty Services Required    Requested Specialty:   Physical Therapy    Number of Visits Requested:   1   Blood Thinner Instructions to Nursing    Always make sure patient has clearance from prescribing physician to stop blood thinners for interventional therapies. If the patient requires a Lovenox-bridge therapy, make sure arrangements are made to institute it with the assistance of the PCP.    Scheduling Instructions:     Have Ms. Hu stop the Plavix (Clopidogrel) x 7-10 days prior to procedure or surgery.   Follow-up plan:   Return for (ECT): (L) T-FCT Blk #1, (Blood Thinner Protocol).      Interventional Therapies  Risk Factors  Considerations  Medical Comorbidities:  Plavix Anticoagulation: (Stop: 7-10 days  Restart: 2 hours)    Planned  Pending:   Diagnostic/therapeutic left T5-6 and T6-7 thoracic facet MBB #1    Under consideration:   Diagnostic/therapeutic left T5-6 and T6-7 thoracic facet MBB #1    Completed:   Diagnostic/therapeutic left T6-7 thoracic ESI x2 (02/24/2023) (100/100/90 x 3 days/0)    Therapeutic  Palliative (PRN) options:   None established   Completed by other providers:   Therapeutic left thoracic dry needling  Therapeutic chiropractic manipulations       Recent Visits Date Type Provider Dept  05/04/23  Office Visit Delano Metz, MD [facility]  04/07/23 Procedure visit Delano Metz, MD [facility]  03/23/23 Office Visit Delano Metz, MD [facility]  02/24/23 Procedure visit Delano Metz, MD [facility]  Showing recent visits within past 90 days and meeting all other requirements Future Appointments No visits were found meeting these conditions. Showing future appointments within next 90 days and meeting all other requirements  I discussed the assessment and treatment plan with the patient. The patient was provided an opportunity to ask questions and all were answered. The patient agreed with the plan and demonstrated an understanding of the instructions.  Patient advised to call back or seek an in-person evaluation if the symptoms or condition worsens.  Duration of encounter: 30 minutes.  Total time on encounter, as per AMA guidelines included both the face-to-face and non-face-to-face time personally spent by the physician and/or other qualified health care professional(s) on the day of the encounter (includes time in activities that require the physician or other qualified health care professional and does not include time in activities normally performed by clinical staff). Physician's time may include the following activities when performed: Preparing to see the patient (e.g., pre-charting review of records, searching for previously ordered imaging, lab work, and nerve conduction tests) Review of prior analgesic pharmacotherapies. Reviewing PMP Interpreting ordered tests (e.g., lab work, imaging, nerve conduction tests) Performing post-procedure evaluations, including interpretation of diagnostic procedures Obtaining and/or reviewing separately obtained history Performing a medically appropriate examination and/or evaluation Counseling and educating the patient/family/caregiver Ordering medications, tests, or  procedures Referring and communicating with other health care professionals (when not separately reported) Documenting clinical information in the electronic or other health record Independently interpreting results (not separately reported) and communicating results to the  patient/ family/caregiver Care coordination (not separately reported)  Note by: Oswaldo Done, MD Date: 05/04/2023; Time: 6:05 AM

## 2023-05-04 ENCOUNTER — Encounter: Payer: Self-pay | Admitting: Pain Medicine

## 2023-05-04 ENCOUNTER — Ambulatory Visit: Payer: 59 | Attending: Pain Medicine | Admitting: Pain Medicine

## 2023-05-04 VITALS — BP 90/67 | HR 98 | Temp 97.4°F | Ht 62.0 in | Wt 117.0 lb

## 2023-05-04 DIAGNOSIS — M5124 Other intervertebral disc displacement, thoracic region: Secondary | ICD-10-CM | POA: Diagnosis not present

## 2023-05-04 DIAGNOSIS — M47814 Spondylosis without myelopathy or radiculopathy, thoracic region: Secondary | ICD-10-CM

## 2023-05-04 DIAGNOSIS — M47894 Other spondylosis, thoracic region: Secondary | ICD-10-CM

## 2023-05-04 DIAGNOSIS — M546 Pain in thoracic spine: Secondary | ICD-10-CM | POA: Insufficient documentation

## 2023-05-04 DIAGNOSIS — R937 Abnormal findings on diagnostic imaging of other parts of musculoskeletal system: Secondary | ICD-10-CM | POA: Diagnosis not present

## 2023-05-04 DIAGNOSIS — Z09 Encounter for follow-up examination after completed treatment for conditions other than malignant neoplasm: Secondary | ICD-10-CM | POA: Diagnosis not present

## 2023-05-04 DIAGNOSIS — Z7901 Long term (current) use of anticoagulants: Secondary | ICD-10-CM

## 2023-05-04 DIAGNOSIS — M5134 Other intervertebral disc degeneration, thoracic region: Secondary | ICD-10-CM | POA: Diagnosis not present

## 2023-05-04 DIAGNOSIS — G8929 Other chronic pain: Secondary | ICD-10-CM | POA: Insufficient documentation

## 2023-05-04 NOTE — Progress Notes (Signed)
Safety precautions to be maintained throughout the outpatient stay will include: orient to surroundings, keep bed in low position, maintain call bell within reach at all times, provide assistance with transfer out of bed and ambulation.  

## 2023-05-04 NOTE — Patient Instructions (Signed)
______________________________________________________________________    Procedure instructions  Stop blood-thinners  Do not eat or drink fluids (other than water) for 6 hours before your procedure  No water for 2 hours before your procedure  Take your blood pressure medicine with a sip of water  Arrive 30 minutes before your appointment  If sedation is planned, bring suitable driver. Pennie Banter, Benedetto Goad, & public transportation are NOT APPROVED)  Carefully read the "Preparing for your procedure" detailed instructions  If you have questions call us at [phone]  ______________________________________________________________________      ______________________________________________________________________    Preparing for your procedure  Appointments: If you think you may not be able to keep your appointment, call 24-48 hours in advance to cancel. We need time to make it available to others.  During your procedure appointment there will be: No Prescription Refills. No disability issues to discussed. No medication changes or discussions.  Instructions: Food intake: Avoid eating anything solid for at least 8 hours prior to your procedure. Clear liquid intake: You may take clear liquids such as water up to 2 hours prior to your procedure. (No carbonated drinks. No soda.) Transportation: Unless otherwise stated by your physician, bring a driver. (Driver cannot be a Market researcher, Pharmacist, community, or any other form of public transportation.) Morning Medicines: Except for blood thinners, take all of your other morning medications with a sip of water. Make sure to take your heart and blood pressure medicines. If your blood pressure's lower number is above 100, the case will be rescheduled. Blood thinners: Make sure to stop your blood thinners as instructed.  If you take a blood thinner, but were not instructed to stop it, call our office [phone] and ask to talk to a nurse. Not stopping a blood  thinner prior to certain procedures could lead to serious complications. Diabetics on insulin: Notify the staff so that you can be scheduled 1st case in the morning. If your diabetes requires high dose insulin, take only  of your normal insulin dose the morning of the procedure and notify the staff that you have done so. Preventing infections: Shower with an antibacterial soap the morning of your procedure.  Build-up your immune system: Take 1000 mg of Vitamin C with every meal (3 times a day) the day prior to your procedure. Antibiotics: Inform the nursing staff if you are taking any antibiotics or if you have any conditions that may require antibiotics prior to procedures. (Example: recent joint implants)   Pregnancy: If you are pregnant make sure to notify the nursing staff. Not doing so may result in injury to the fetus, including death.  Sickness: If you have a cold, fever, or any active infections, call and cancel or reschedule your procedure. Receiving steroids while having an infection may result in complications. Arrival: You must be in the facility at least 30 minutes prior to your scheduled procedure. Tardiness: Your scheduled time is also the cutoff time. If you do not arrive at least 15 minutes prior to your procedure, you will be rescheduled.  Children: Do not bring any children with you. Make arrangements to keep them home. Dress appropriately: There is always a possibility that your clothing may get soiled. Avoid long dresses. Valuables: Do not bring any jewelry or valuables.  Reasons to call and reschedule or cancel your procedure: (Following these recommendations will minimize the risk of a serious complication.) Surgeries: Avoid having procedures within 2 weeks of any surgery. (Avoid for 2 weeks before or after any surgery). Flu Shots:  Avoid having procedures within 2 weeks of a flu shots or . (Avoid for 2 weeks before or after immunizations). Barium: Avoid having a procedure  within 7-10 days after having had a radiological study involving the use of radiological contrast. (Myelograms, Barium swallow or enema study). Heart attacks: Avoid any elective procedures or surgeries for the initial 6 months after a "Myocardial Infarction" (Heart Attack). Blood thinners: It is imperative that you stop these medications before procedures. Let us know if you if you take any blood thinner.  Infection: Avoid procedures during or within two weeks of an infection (including chest colds or gastrointestinal problems). Symptoms associated with infections include: Localized redness, fever, chills, night sweats or profuse sweating, burning sensation when voiding, cough, congestion, stuffiness, runny nose, sore throat, diarrhea, nausea, vomiting, cold or Flu symptoms, recent or current infections. It is specially important if the infection is over the area that we intend to treat. Heart and lung problems: Symptoms that may suggest an active cardiopulmonary problem include: cough, chest pain, breathing difficulties or shortness of breath, dizziness, ankle swelling, uncontrolled high or unusually low blood pressure, and/or palpitations. If you are experiencing any of these symptoms, cancel your procedure and contact your primary care physician for an evaluation.  Remember:  Regular Business hours are:  Monday to Thursday 8:00 AM to 4:00 PM  Provider's Schedule: Delano Metz, MD:  Procedure days: Tuesday and Thursday 7:30 AM to 4:00 PM  Edward Jolly, MD:  Procedure days: Monday and Wednesday 7:30 AM to 4:00 PM Last  Updated: 02/08/2023 ______________________________________________________________________      ______________________________________________________________________    General Risks and Possible Complications  Patient Responsibilities: It is important that you read this as it is part of your informed consent. It is our duty to inform you of the risks and possible  complications associated with treatments offered to you. It is your responsibility as a patient to read this and to ask questions about anything that is not clear or that you believe was not covered in this document.  Patient's Rights: You have the right to refuse treatment. You also have the right to change your mind, even after initially having agreed to have the treatment done. However, under this last option, if you wait until the last second to change your mind, you may be charged for the materials used up to that point.  Introduction: Medicine is not an Visual merchandiser. Everything in Medicine, including the lack of treatment(s), carries the potential for danger, harm, or loss (which is by definition: Risk). In Medicine, a complication is a secondary problem, condition, or disease that can aggravate an already existing one. All treatments carry the risk of possible complications. The fact that a side effects or complications occurs, does not imply that the treatment was conducted incorrectly. It must be clearly understood that these can happen even when everything is done following the highest safety standards.  No treatment: You can choose not to proceed with the proposed treatment alternative. The "PRO(s)" would include: avoiding the risk of complications associated with the therapy. The "CON(s)" would include: not getting any of the treatment benefits. These benefits fall under one of three categories: diagnostic; therapeutic; and/or palliative. Diagnostic benefits include: getting information which can ultimately lead to improvement of the disease or symptom(s). Therapeutic benefits are those associated with the successful treatment of the disease. Finally, palliative benefits are those related to the decrease of the primary symptoms, without necessarily curing the condition (example: decreasing the pain from a flare-up  of a chronic condition, such as incurable terminal cancer).  General Risks and  Complications: These are associated to most interventional treatments. They can occur alone, or in combination. They fall under one of the following six (6) categories: no benefit or worsening of symptoms; bleeding; infection; nerve damage; allergic reactions; and/or death. No benefits or worsening of symptoms: In Medicine there are no guarantees, only probabilities. No healthcare provider can ever guarantee that a medical treatment will work, they can only state the probability that it may. Furthermore, there is always the possibility that the condition may worsen, either directly, or indirectly, as a consequence of the treatment. Bleeding: This is more common if the patient is taking a blood thinner, either prescription or over the counter (example: Goody Powders, Fish oil, Aspirin, Garlic, etc.), or if suffering a condition associated with impaired coagulation (example: Hemophilia, cirrhosis of the liver, low platelet counts, etc.). However, even if you do not have one on these, it can still happen. If you have any of these conditions, or take one of these drugs, make sure to notify your treating physician. Infection: This is more common in patients with a compromised immune system, either due to disease (example: diabetes, cancer, human immunodeficiency virus [HIV], etc.), or due to medications or treatments (example: therapies used to treat cancer and rheumatological diseases). However, even if you do not have one on these, it can still happen. If you have any of these conditions, or take one of these drugs, make sure to notify your treating physician. Nerve Damage: This is more common when the treatment is an invasive one, but it can also happen with the use of medications, such as those used in the treatment of cancer. The damage can occur to small secondary nerves, or to large primary ones, such as those in the spinal cord and brain. This damage may be temporary or permanent and it may lead to  impairments that can range from temporary numbness to permanent paralysis and/or brain death. Allergic Reactions: Any time a substance or material comes in contact with our body, there is the possibility of an allergic reaction. These can range from a mild skin rash (contact dermatitis) to a severe systemic reaction (anaphylactic reaction), which can result in death. Death: In general, any medical intervention can result in death, most of the time due to an unforeseen complication. ______________________________________________________________________      ______________________________________________________________________    Blood Thinners  IMPORTANT NOTICE:  If you take any of these, make sure to notify the nursing staff.  Failure to do so may result in serious injury.  Recommended time intervals to stop and restart blood-thinners, before & after invasive procedures  Generic Name Brand Name Pre-procedure: Stop medication for this amount of time before your procedure: Post-procedure: Wait this amount of time after the procedure before restarting your medication:  Abciximab Reopro 15 days 2 hrs  Alteplase Activase 10 days 10 days  Anagrelide Agrylin    Apixaban Eliquis 3 days 6 hrs  Cilostazol Pletal 3 days 5 hrs  Clopidogrel Plavix 7-10 days 2 hrs  Dabigatran Pradaxa 5 days 6 hrs  Dalteparin Fragmin 24 hours 4 hrs  Dipyridamole Aggrenox 11days 2 hrs  Edoxaban Lixiana; Savaysa 3 days 2 hrs  Enoxaparin  Lovenox 24 hours 4 hrs  Eptifibatide Integrillin 8 hours 2 hrs  Fondaparinux  Arixtra 72 hours 12 hrs  Hydroxychloroquine Plaquenil 11 days   Prasugrel Effient 7-10 days 6 hrs  Reteplase Retavase 10 days 10 days  Rivaroxaban  Xarelto 3 days 6 hrs  Ticagrelor Brilinta 5-7 days 6 hrs  Ticlopidine Ticlid 10-14 days 2 hrs  Tinzaparin Innohep 24 hours 4 hrs  Tirofiban Aggrastat 8 hours 2 hrs  Warfarin Coumadin 5 days 2 hrs   Other medications with blood-thinning effects  Product  indications Generic (Brand) names Note  Cholesterol Lipitor Stop 4 days before procedure  Blood thinner (injectable) Heparin (LMW or LMWH Heparin) Stop 24 hours before procedure  Cancer Ibrutinib (Imbruvica) Stop 7 days before procedure  Malaria/Rheumatoid Hydroxychloroquine (Plaquenil) Stop 11 days before procedure  Thrombolytics  10 days before or after procedures   Over-the-counter (OTC) Products with blood-thinning effects  Product Common names Stop Time  Aspirin > 325 mg Goody Powders, Excedrin, etc. 11 days  Aspirin <= 81 mg  7 days  Fish oil  4 days  Garlic supplements  7 days  Ginkgo biloba  36 hours  Ginseng  24 hours  NSAIDs Ibuprofen, Naprosyn, etc. 3 days  Vitamin E  4 days   ______________________________________________________________________

## 2023-05-10 ENCOUNTER — Ambulatory Visit: Payer: 59 | Admitting: Physician Assistant

## 2023-05-12 ENCOUNTER — Telehealth: Payer: Self-pay | Admitting: Pain Medicine

## 2023-05-12 ENCOUNTER — Encounter: Payer: Self-pay | Admitting: Oncology

## 2023-05-12 NOTE — Telephone Encounter (Signed)
PT has first appt on 05-10-23 and was schedule to go into office 2 times a week. However patient call back to office and cancel appt. [facility] will keep referral open for a week or so. To see if patient will like to come back. PT inform the office that she has to much going on. FYI

## 2023-05-17 ENCOUNTER — Encounter: Payer: Self-pay | Admitting: Oncology

## 2023-05-24 ENCOUNTER — Inpatient Hospital Stay: Payer: 59 | Attending: Oncology

## 2023-05-30 ENCOUNTER — Other Ambulatory Visit: Payer: Self-pay | Admitting: Family Medicine

## 2023-05-30 DIAGNOSIS — I6930 Unspecified sequelae of cerebral infarction: Secondary | ICD-10-CM

## 2023-05-31 NOTE — Telephone Encounter (Signed)
Requested Prescriptions  Pending Prescriptions Disp Refills   clopidogrel (PLAVIX) 75 MG tablet [Pharmacy Med Name: CLOPIDOGREL 75 MG TABLET] 30 tablet 0    Sig: Take 1 tablet (75 mg total) bymouth daily.     Hematology: Antiplatelets - clopidogrel Passed - 05/30/2023  7:17 AM      Passed - HCT in normal range and within 180 days    HCT  Date Value Ref Range Status  01/25/2023 36.8 36.0 - 46.0 % Final         Passed - HGB in normal range and within 180 days    Hemoglobin  Date Value Ref Range Status  01/25/2023 12.2 12.0 - 15.0 g/dL Final  64/40/3474 25.9 (H) 12.0 - 15.0 g/dL Final         Passed - PLT in normal range and within 180 days    Platelets  Date Value Ref Range Status  01/25/2023 200 150 - 400 K/uL Final   Platelet Count  Date Value Ref Range Status  11/22/2022 208 150 - 400 K/uL Final         Passed - Cr in normal range and within 360 days    Creat  Date Value Ref Range Status  12/22/2022 0.92 0.50 - 0.99 mg/dL Final   Creatinine, Ser  Date Value Ref Range Status  01/25/2023 0.81 0.44 - 1.00 mg/dL Final         Passed - Valid encounter within last 6 months    Recent Outpatient Visits           3 months ago Essential hypertension   [place] [facility] Smitty Cords, DO   5 months ago Essential hypertension   [place] [facility] Smitty Cords, DO   5 months ago Chronic left-sided thoracic back pain   [place] [facility], Ocie Bob, MD   5 months ago Hypertensive urgency   [place] [facility], Salvadore Oxford, NP   6 months ago Chronic left-sided thoracic back pain   [facility], Ocie Bob, MD       Future Appointments             In 1 week Althea Charon, Netta Neat, DO [place] [facility]   In 2 weeks Shea Evans, Raymon Mutton, PA-C [place]  [facility]

## 2023-06-07 ENCOUNTER — Encounter: Payer: Self-pay | Admitting: Family Medicine

## 2023-06-07 ENCOUNTER — Ambulatory Visit (INDEPENDENT_AMBULATORY_CARE_PROVIDER_SITE_OTHER): Payer: 59 | Admitting: Family Medicine

## 2023-06-07 VITALS — BP 128/86 | HR 60 | Ht 62.0 in | Wt 122.0 lb

## 2023-06-07 DIAGNOSIS — B370 Candidal stomatitis: Secondary | ICD-10-CM

## 2023-06-07 DIAGNOSIS — G8929 Other chronic pain: Secondary | ICD-10-CM

## 2023-06-07 DIAGNOSIS — M47894 Other spondylosis, thoracic region: Secondary | ICD-10-CM

## 2023-06-07 DIAGNOSIS — H53451 Other localized visual field defect, right eye: Secondary | ICD-10-CM | POA: Diagnosis not present

## 2023-06-07 DIAGNOSIS — I6930 Unspecified sequelae of cerebral infarction: Secondary | ICD-10-CM

## 2023-06-07 DIAGNOSIS — E611 Iron deficiency: Secondary | ICD-10-CM

## 2023-06-07 DIAGNOSIS — E538 Deficiency of other specified B group vitamins: Secondary | ICD-10-CM

## 2023-06-07 DIAGNOSIS — I10 Essential (primary) hypertension: Secondary | ICD-10-CM

## 2023-06-07 DIAGNOSIS — M5134 Other intervertebral disc degeneration, thoracic region: Secondary | ICD-10-CM

## 2023-06-07 MED ORDER — HYDROCODONE-ACETAMINOPHEN 5-325 MG PO TABS: 1.0000 | ORAL_TABLET | ORAL | 0 refills | Status: DC | PRN

## 2023-06-07 MED ORDER — FLUCONAZOLE 150 MG PO TABS: 150.0000 mg | ORAL_TABLET | ORAL | 0 refills | Status: DC

## 2023-06-07 NOTE — Progress Notes (Signed)
Subjective:    Patient ID: Joanna Garcia, female    DOB: [birth year], 47 y.o.   MRN: [id number]  Joanna Garcia is a 47 y.o. female presenting on 06/07/2023 for Medical Management of Chronic Issues and Hypertension (Back pain,   also right eye will be a bright light every day around lunch. )   HPI  Discussed the use of AI scribe software for clinical note transcription with the patient, who gave verbal consent to proceed.  Iron Deficiency Anemia The patient also requested a review of her blood work, specifically cholesterol and hemoglobin levels. The patient has a history of hematological issues and has been followed by a hematologist, but is considering discontinuing these visits if the blood work can be managed in this setting.  - Requested new orders on labs for anemia     CHRONIC HTN: No new concerns. Controlled Current Meds - Losartan 25mg  daily, Amlodipine 5mg  daily, Denies CP, dyspnea, HA, edema, dizziness / lightheadedness   Hyperlipidemia History of CVA On Atorvastatin 80mg  daily   Chronic Thoracic Mid Back Pain Followed by [facility] Dr Laban Emperor She has completed x 2 ESI epidural injection Lumbar spine, mixed response, but did not resolve symptoms and pain. - She is now awaiting further procedure with RAF Ablation, pending approval - Pain management asked her to follow up with me for medications  Oral Thrush / Tongue Recurrent issue, has been on Diflucan x 2 doses, and oral mouthwash did not fully resolve. Some irritation on tongue but has white patch  Right Eye, Vision changes >6 months has had episodic vision loss often in afternoon, R eye only. She will describe no actual loss of vision but has some bright appearance to vision, seems like bright white still has central vision. No eye pain or discharge or other symptoms. She had it checked out by Optometry at [facility] but no further treatment or recommendations.      06/07/2023    9:41 AM 02/24/2023     9:13 AM 02/08/2023    9:45 AM  Depression screen PHQ 2/9  Decreased Interest 0 0 0  Down, Depressed, Hopeless 0 0 0  PHQ - 2 Score 0 0 0  Altered sleeping 0  0  Tired, decreased energy 1  0  Change in appetite 0  0  Feeling bad or failure about yourself  0  0  Trouble concentrating 0  0  Moving slowly or fidgety/restless 0  0  Suicidal thoughts 0  0  PHQ-9 Score 1  0  Difficult doing work/chores   Not difficult at all       06/07/2023    9:42 AM 02/08/2023    9:46 AM 12/22/2022    9:18 AM  GAD 7 : Generalized Anxiety Score  Nervous, Anxious, on Edge 0 0 0  Control/stop worrying 0 0 0  Worry too much - different things  0 0  Trouble relaxing 0 0 0  Restless 0 0 0  Easily annoyed or irritable 0 0 0  Afraid - awful might happen 0 0 0  Total GAD 7 Score  0 0  Anxiety Difficulty  Not difficult at all Not difficult at all    Social History   Tobacco Use   Smoking status: Every Day    Current packs/day: 0.75    Average packs/day: 0.7 packs/day for 31.0 years (23.2 ttl pk-yrs)    Types: Cigarettes    Start date: 06/21/1992   Smokeless tobacco: Never  Vaping Use   Vaping status: Never Used  Substance Use Topics   Alcohol use: Yes    Comment: 24 Oz daily   Drug use: Not Currently    Types: Marijuana    Review of Systems Per HPI unless specifically indicated above     Objective:    BP 128/86   Pulse 60   Ht 5\' 2"  (1.575 m)   Wt 122 lb (55.3 kg)   BMI 22.31 kg/m   Wt Readings from Last 3 Encounters:  06/07/23 122 lb (55.3 kg)  05/04/23 117 lb (53.1 kg)  04/20/23 115 lb 9.6 oz (52.4 kg)    Physical Exam Vitals and nursing note reviewed.  Constitutional:      General: She is not in acute distress.    Appearance: She is well-developed. She is not diaphoretic.     Comments: Well-appearing, comfortable, cooperative  HENT:     Head: Normocephalic and atraumatic.  Eyes:     General:        Right eye: No discharge.        Left eye: No discharge.      Conjunctiva/sclera: Conjunctivae normal.  Neck:     Thyroid: No thyromegaly.  Cardiovascular:     Rate and Rhythm: Normal rate and regular rhythm.     Heart sounds: Normal heart sounds. No murmur heard. Pulmonary:     Effort: Pulmonary effort is normal. No respiratory distress.     Breath sounds: Normal breath sounds. No wheezing or rales.  Musculoskeletal:        General: Normal range of motion.     Cervical back: Normal range of motion and neck supple.  Lymphadenopathy:     Cervical: No cervical adenopathy.  Skin:    General: Skin is warm and dry.     Findings: No erythema or rash.  Neurological:     Mental Status: She is alert and oriented to person, place, and time.  Psychiatric:        Behavior: Behavior normal.     Comments: Well groomed, good eye contact, normal speech and thoughts     Results for orders placed or performed in visit on 02/02/23  Compliance Drug Analysis, Ur   Collection Time: 02/02/23  9:52 AM  Result Value Ref Range   Summary Note   Sedimentation rate   Collection Time: 02/02/23  9:54 AM  Result Value Ref Range   Sed Rate 2 0 - 32 mm/hr  C-reactive protein   Collection Time: 02/02/23  9:54 AM  Result Value Ref Range   CRP <1 0 - 10 mg/L      Assessment & Plan:   Problem List Items Addressed This Visit     Chronic thoracic back pain (Left) (Chronic)   Relevant Medications   HYDROcodone-acetaminophen (NORCO/VICODIN) 5-325 MG tablet   DDD (degenerative disc disease), thoracic (Chronic)   Relevant Medications   HYDROcodone-acetaminophen (NORCO/VICODIN) 5-325 MG tablet   Thoracic facet syndrome (Left) (Chronic)   Relevant Medications   HYDROcodone-acetaminophen (NORCO/VICODIN) 5-325 MG tablet   Iron deficiency   Relevant Orders   CBC with Differential/Platelet   Vitamin B12   Iron, TIBC and Ferritin Panel   Other Visit Diagnoses       Oral thrush    -  Primary   Relevant Medications   fluconazole (DIFLUCAN) 150 MG tablet     Loss of  peripheral visual field, right       Relevant Orders   Ambulatory referral to Ophthalmology  History of cerebrovascular accident (CVA) with residual deficit         Essential hypertension         Vitamin B12 deficiency       Relevant Orders   Vitamin B12         Chronic Mid / Low Back Pain Thoracic DDD, Facet Syndrome Followed by [facility] Dr Laban Emperor Inadequate pain control with meloxicam. Two epidural injections provided minimal relief. Awaiting insurance approval for radiofrequency ablation.  Her Pain Management provider is requesting that she discuss opiate medication management for pain control with our office. I discussed with patient that as her PCP I am agreeable to managing this as long as she follows with Pain Management for their advice and procedures. I will assist with her medication management at this time. In future if require higher doses or other complications we can consider locating other provider who is able to prescribe appropriate therapy.  -Start Hydrocodone/Acetaminophen 5-325mg , 1-3 times per day as needed for pain. -Plan for follow-up in 1 month to reassess pain control and adjust medication regimen as needed. Will need to print / sign review a Opiate Controlled Substance contract here at next visit  Oral Thrush Does not appear to be other type of infection, ulceration, inflammation Persistent despite initial short course of Diflucan and use of mouthwash. -Start Diflucan, one tablet every other day for two weeks.  R Eye Visual Disturbance Episodic bright white vision in the right eye, lasting for several months. No associated pain. Prior evaluation by optometrist revealed no abnormalities. -Expedited referral to ophthalmology for further evaluation.  General Health Maintenance -Order labs today including CBC, ferritin, iron studies, and B12. -Follow-up in 1 month to reassess pain control and discuss lab results.         Orders Placed  This Encounter  Procedures   CBC with Differential/Platelet   Vitamin B12   Iron, TIBC and Ferritin Panel   Ambulatory referral to Ophthalmology    Referral Priority:   Routine    Referral Type:   Consultation    Referral Reason:   Specialty Services Required    Requested Specialty:   Ophthalmology    Number of Visits Requested:   1    Meds ordered this encounter  Medications   fluconazole (DIFLUCAN) 150 MG tablet    Sig: Take 1 tablet (150 mg total) by mouth every other day.    Dispense:  7 tablet    Refill:  0   HYDROcodone-acetaminophen (NORCO/VICODIN) 5-325 MG tablet    Sig: Take 1 tablet by mouth every 4 (four) hours as needed for moderate pain (pain score 4-6) or severe pain (pain score 7-10).    Dispense:  30 tablet    Refill:  0    Follow up plan: Return in about 4 weeks (around 07/05/2023) for 4 weeks Back Pain Pain Med refill.   [name], DO [facility] 06/07/2023, 9:52 AM

## 2023-06-07 NOTE — Patient Instructions (Addendum)
Thank you for coming to the office today.  Start Hydrocodone-Acetaminophen 5/325 as ordered, ideally 1-3 times per day max We can adjust, let me know how it goes.  For R eye vision loss Referral today  [facility] [address] [place], [place] [address] Phone: [phone] [email or web address]  For Oral Thrush take one tab every other day for 2 weeks.  Please schedule a Follow-up Appointment to: Return in about 4 weeks (around 07/05/2023) for 4 weeks Back Pain Pain Med refill.  If you have any other questions or concerns, please feel free to call the office or send a message through MyChart. You may also schedule an earlier appointment if necessary.  Additionally, you may be receiving a survey about your experience at our office within a few days to 1 week by e-mail or mail. We value your feedback.  [name], DO [facility] [place], [place]

## 2023-06-08 LAB — CBC WITH DIFFERENTIAL/PLATELET
Absolute Lymphocytes: 2603 {cells}/uL (ref 850–3900)
Absolute Monocytes: 648 {cells}/uL (ref 200–950)
Basophils Absolute: 108 {cells}/uL (ref 0–200)
Basophils Relative: 1 %
Eosinophils Absolute: 248 {cells}/uL (ref 15–500)
Eosinophils Relative: 2.3 %
HCT: 40 % (ref 35.0–45.0)
Hemoglobin: 14 g/dL (ref 11.7–15.5)
MCH: 37.8 pg — ABNORMAL HIGH (ref 27.0–33.0)
MCHC: 35 g/dL (ref 32.0–36.0)
MCV: 108.1 fL — ABNORMAL HIGH (ref 80.0–100.0)
MPV: 11.7 fL (ref 7.5–12.5)
Monocytes Relative: 6 %
Neutro Abs: 7193 {cells}/uL (ref 1500–7800)
Neutrophils Relative %: 66.6 %
Platelets: 305 10*3/uL (ref 140–400)
RBC: 3.7 10*6/uL — ABNORMAL LOW (ref 3.80–5.10)
RDW: 13.2 % (ref 11.0–15.0)
Total Lymphocyte: 24.1 %
WBC: 10.8 10*3/uL (ref 3.8–10.8)

## 2023-06-08 LAB — IRON,TIBC AND FERRITIN PANEL
%SAT: 38 % (ref 16–45)
Ferritin: 211 ng/mL (ref 16–232)
Iron: 130 ug/dL (ref 40–190)
TIBC: 345 ug/dL (ref 250–450)

## 2023-06-08 LAB — VITAMIN B12: Vitamin B-12: 222 pg/mL (ref 200–1100)

## 2023-06-11 ENCOUNTER — Encounter: Payer: Self-pay | Admitting: Family Medicine

## 2023-06-11 DIAGNOSIS — E538 Deficiency of other specified B group vitamins: Secondary | ICD-10-CM

## 2023-06-13 ENCOUNTER — Encounter: Payer: Self-pay | Admitting: Oncology

## 2023-06-13 MED ORDER — CYANOCOBALAMIN 1000 MCG/ML IJ SOLN: 1000.0000 ug | INTRAMUSCULAR | 0 refills | Status: DC

## 2023-06-16 ENCOUNTER — Ambulatory Visit: Payer: 59 | Attending: Physician Assistant | Admitting: Physician Assistant

## 2023-06-16 ENCOUNTER — Other Ambulatory Visit: Payer: Self-pay | Admitting: Family Medicine

## 2023-06-16 DIAGNOSIS — M5134 Other intervertebral disc degeneration, thoracic region: Secondary | ICD-10-CM

## 2023-06-16 DIAGNOSIS — G8929 Other chronic pain: Secondary | ICD-10-CM

## 2023-06-16 MED ORDER — HYDROCODONE-ACETAMINOPHEN 5-325 MG PO TABS: 1.0000 | ORAL_TABLET | ORAL | 0 refills | Status: DC | PRN

## 2023-06-16 NOTE — Progress Notes (Deleted)
Cardiology Office Note:    Date:  06/16/2023  ID:  Joanna Garcia, DOB [birth year], MRN [id number] PCP: Smitty Cords, DO  [facility] Providers Cardiologist:  Debbe Odea, MD       Patient Profile:      Joanna Garcia is a 47 year old female  In 01/2021 she presented to [facility] with stroke, symptoms of sudden onset left sided weakness. She was dx with Rt MCA and MCA/ACA infarct, punctate scattered infarcts and treated initially with tPA, secondary to large vessel disease with right ICA/CCA thrombus due to atherosclerosis and polycythemia. Neuro Interventional Radiology was consulted that performed cerebral angiogram with intervention - with stenting of the right cervical ICA. She has a known occlusion of left ICA.   She was admitted on 01/22/2023 for hypertensive emergency and elevated troponins.  Of note a week prior to admission she had been experiencing blood pressures over 200 systolic, headaches, ringing in her ears, and palpitations.  On 01/22/2023 she had had some chest tightness with radiation to her left arm.  Prior to her admission she had been taking lisinopril 10 mg but her PCP took her off and placed her on HCTZ 12.5 mg due to lisinopril lowing her blood pressure to 100 systolic.  On arrival to the ED her blood pressure was 205/107 with a heart rate of 114.  Noncontrast head CT showed no evidence of acute intracranial hemorrhage.  High-sensitivity troponins trended 27, 109, 92, 49.  Her serum potassium was 2.8.  IV heparin was held due to likely demand ischemia from hypertensive urgency.  Blood pressure improved after IV labetalol in the ED.  Echocardiogram was performed showing 55 to 60% LVEF, LV normal function, no RWMA, LV diastolic parameters were normal, RV function normal, mild mitral valve regurgitation.  Renal Dopplers were performed showing mildly elevated velocity of the origin of the left main renal artery suspicious for stenosis.  Vascular was consulted who  performed left renal artery angiogram on 01/24/2023, where a stent was placed in the left renal artery after confirmed 70 to 80% proximal left renal artery stenosis.  She was discharged on 01/25/2023 with plan to have outpatient coronary CTA.  She was discharged home on amlodipine 5 mg and losartan 25 mg.  On 02/02/2023  coronary CTA showed calcium score of 131, mild nonobstructive coronary artery disease, mild proximal RCA stenosis (25-49%), minimal LAD and LCx stenosis (less than 25%).  On 02/08/2023 she followed up with her PCP who noted her blood pressure to be 152/78 and her losartan was increased from 25 mg to 50 mg and amlodipine 5 mg was continued.  Patient had then noted to be hypotensive at home with blood pressures in the 80s that caused her to stop her antihypertensive therapy and her losartan was then decreased back to 25 mg.      History of Present Illness:   Joanna Garcia is a 47 y.o. female who returns for post hospital admission on 01/22/23 for hypertensive emergency with demand ischemia .    Today:  Patient notes that she has not taken her antihypertensive medications over the last 5 days.  She tells me that her blood pressures at home have been in the upper 80s to low 90s systolic since her PCP increased her losartan from 25 mg to 50 mg.  She notes prior to this medication change since she was discharged from the hospital her blood pressures were more stable.  She notes when she became hypotensive she experienced nausea,  fatigue, weakness, dizziness and lightheadedness.  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  She does not take her blood pressure before taking her antihypertensives, only when she started feeling symptomatic which she noted was about every day since her medication change, notably just within an hour after she takes her medication.  Since she stopped her antihypertensive medications she notes that her  symptoms have improved.  However her blood pressures are now running in the 170s consistently.  She notes headache today and continued fatigue.  She denies chest pain, shortness of breath, palpitations. We reviewed her cardiac CTA, renal ultrasound, and echocardiogram results today.          Review of Systems  Constitutional: Negative for weight gain and weight loss.  Cardiovascular:  Negative for chest pain, claudication, cyanosis, dyspnea on exertion, irregular heartbeat, leg swelling, near-syncope, orthopnea, palpitations, paroxysmal nocturnal dyspnea and syncope.  Respiratory:  Negative for cough, hemoptysis and shortness of breath.   Gastrointestinal:  Negative for abdominal pain, hematochezia and melena.  Genitourinary:  Negative for hematuria.  Neurological:  Positive for headaches. Negative for dizziness and light-headedness.     See HPI     Studies Reviewed:       Cardiac CTA 02/02/23 IMPRESSION: 1. Coronary calcium score of 131. This was 99th percentile for age and sex matched control.   2. Normal coronary origin with right dominance.   3. Mild proximal RCA stenosis (25-49%).   4. Minimal LAD and LCx stenosis (<25%).   5. CAD-RADS 2. Mild non-obstructive CAD (25-49%). Consider non-atherosclerotic causes of chest pain. Consider preventive therapy and risk factor modification.  Echo 01/23/23  1. Left ventricular ejection fraction, by estimation, is 55 to 60%. The  left ventricle has normal function. The left ventricle has no regional  wall motion abnormalities. Left ventricular diastolic parameters were  normal. The average left ventricular  global longitudinal strain is -16.7 %. The global longitudinal strain is  normal.   2. Right ventricular systolic function is normal. The right ventricular  size is normal.   3. The mitral valve is normal in structure. Mild mitral valve  regurgitation.   4. The aortic valve was not well visualized. Aortic valve regurgitation  is  not visualized. Aortic valve sclerosis is present, with no evidence of  aortic valve stenosis.   5. The inferior vena cava is normal in size with greater than 50%  respiratory variability, suggesting right atrial pressure of 3 mmHg.  Risk Assessment/Calculations:     No BP recorded.  {Refresh Note OR Click here to enter BP  :1}***       Physical Exam:   VS:  There were no vitals taken for this visit.   Wt Readings from Last 3 Encounters:  06/07/23 122 lb (55.3 kg)  05/04/23 117 lb (53.1 kg)  04/20/23 115 lb 9.6 oz (52.4 kg)    Constitutional:      Appearance: Normal and healthy appearance.  Neck:     Vascular: JVD normal.  Pulmonary:     Effort: Pulmonary effort is normal.     Breath sounds: Normal breath sounds.  Chest:     Chest wall: Not tender to palpatation.  Cardiovascular:     PMI at left midclavicular line. Normal rate. Regular rhythm. Normal S1. Normal S2.      Murmurs: There is no murmur.     No gallop.  No click. No rub.  Pulses:    Intact distal pulses.  Edema:    Peripheral edema absent.  Musculoskeletal: Normal range of motion.     Cervical back: Normal range of motion and neck supple. Skin:    General: Skin is warm and dry.  Neurological:     General: No focal deficit present.     Mental Status: Alert and oriented to person, place and time.  Psychiatric:        Behavior: Behavior is cooperative.       Assessment and Plan:                   Dispo:  No follow-ups on file.  Signed, Denyce Robert, NP

## 2023-06-17 ENCOUNTER — Encounter: Payer: Self-pay | Admitting: Physician Assistant

## 2023-06-27 ENCOUNTER — Encounter: Payer: Self-pay | Admitting: Family Medicine

## 2023-06-27 ENCOUNTER — Other Ambulatory Visit: Payer: Self-pay | Admitting: Family Medicine

## 2023-06-27 DIAGNOSIS — I6930 Unspecified sequelae of cerebral infarction: Secondary | ICD-10-CM

## 2023-06-29 NOTE — Telephone Encounter (Signed)
 Requested Prescriptions  Pending Prescriptions Disp Refills   clopidogrel  (PLAVIX ) 75 MG tablet [Pharmacy Med Name: CLOPIDOGREL  75 MG TABLET] 30 tablet 0    Sig: Take 1 tablet (75 mg total) by mouth daily.     Hematology: Antiplatelets - clopidogrel  Passed - 06/29/2023  3:50 PM      Passed - HCT in normal range and within 180 days    HCT  Date Value Ref Range Status  06/07/2023 40.0 35.0 - 45.0 % Final         Passed - HGB in normal range and within 180 days    Hemoglobin  Date Value Ref Range Status  06/07/2023 14.0 11.7 - 15.5 g/dL Final  93/96/7975 84.7 (H) 12.0 - 15.0 g/dL Final         Passed - PLT in normal range and within 180 days    Platelets  Date Value Ref Range Status  06/07/2023 305 140 - 400 Thousand/uL Final   Platelet Count  Date Value Ref Range Status  11/22/2022 208 150 - 400 K/uL Final         Passed - Cr in normal range and within 360 days    Creat  Date Value Ref Range Status  12/22/2022 0.92 0.50 - 0.99 mg/dL Final   Creatinine, Ser  Date Value Ref Range Status  01/25/2023 0.81 0.44 - 1.00 mg/dL Final         Passed - Valid encounter within last 6 months    Recent Outpatient Visits           3 weeks ago Oral thrush   [place] [facility] Karamalegos, Marsa PARAS, DO   4 months ago Essential hypertension   [place] [facility] Edman Marsa PARAS, DO   6 months ago Essential hypertension   [place] [facility] Edman Marsa PARAS, DO   6 months ago Chronic left-sided thoracic back pain   [facility] at [facility], Selinda PARAS, MD   6 months ago Hypertensive urgency   [facility] [place], Angeline ORN, NP       Future Appointments             In 1 week Edman, Marsa PARAS, DO [facility]

## 2023-07-01 ENCOUNTER — Telehealth: Payer: 59 | Admitting: Pain Medicine

## 2023-07-05 ENCOUNTER — Other Ambulatory Visit: Payer: Self-pay | Admitting: Family Medicine

## 2023-07-05 DIAGNOSIS — M545 Low back pain, unspecified: Secondary | ICD-10-CM

## 2023-07-05 DIAGNOSIS — M546 Pain in thoracic spine: Secondary | ICD-10-CM

## 2023-07-06 ENCOUNTER — Ambulatory Visit (INDEPENDENT_AMBULATORY_CARE_PROVIDER_SITE_OTHER): Payer: 59 | Admitting: Family Medicine

## 2023-07-06 ENCOUNTER — Encounter: Payer: Self-pay | Admitting: Family Medicine

## 2023-07-06 VITALS — BP 136/88 | HR 101 | Ht 62.0 in | Wt 126.0 lb

## 2023-07-06 DIAGNOSIS — G8929 Other chronic pain: Secondary | ICD-10-CM

## 2023-07-06 DIAGNOSIS — M5134 Other intervertebral disc degeneration, thoracic region: Secondary | ICD-10-CM

## 2023-07-06 DIAGNOSIS — M546 Pain in thoracic spine: Secondary | ICD-10-CM | POA: Diagnosis not present

## 2023-07-06 DIAGNOSIS — E538 Deficiency of other specified B group vitamins: Secondary | ICD-10-CM

## 2023-07-06 MED ORDER — HYDROCODONE-ACETAMINOPHEN 5-325 MG PO TABS: 1.0000 | ORAL_TABLET | ORAL | 0 refills | Status: DC | PRN

## 2023-07-06 MED ORDER — CYANOCOBALAMIN 1000 MCG/ML IJ SOLN: 1000.0000 ug | INTRAMUSCULAR | 0 refills | Status: AC

## 2023-07-06 NOTE — Progress Notes (Signed)
 Subjective:    Patient ID: Joanna Garcia, female    DOB: 1976-06-09, 48 y.o.   MRN: [id number]  Joanna Garcia is a 48 y.o. female presenting on 07/06/2023 for Back Pain   HPI  Discussed the use of AI scribe software for clinical note transcription with the patient, who gave verbal consent to proceed.  History of Present Illness    Chronic Back Pain, Thoracic  The patient, with a history of chronic back pain, reports no changes in her condition since the last visit in mid-December. She has been receiving treatment from [facility], including two lumbar epidural steroid injections, which she reports have been less effective than her current medication regimen. The patient was considering an ablation procedure, but her insurance denied coverage.  Taking Hydrocodone -Acetaminophen  5/325, usually taking 2-3 per day, often 3 per day, and max of 4 per day.  Usually 1 pill every 4 hours. Successful with pain relief and able to tolerate taking it at work, not too sedated. - Has 2 pills left, 60 pills lasted about 3 weeks.  In addition to her back pain, the patient also reports discomfort from foot calluses.   B12 Deficiency The patient is also on B12 injections, which she confirms are ongoing. She is due to transition to monthly injections after the completion of her current supply. - 2 more doses remaining. Will need monthly order now          07/06/2023    8:35 AM 06/07/2023    9:41 AM 02/24/2023    9:13 AM  Depression screen PHQ 2/9  Decreased Interest 0 0 0  Down, Depressed, Hopeless 0 0 0  PHQ - 2 Score 0 0 0  Altered sleeping  0   Tired, decreased energy  1   Change in appetite  0   Feeling bad or failure about yourself   0   Trouble concentrating  0   Moving slowly or fidgety/restless  0   Suicidal thoughts  0   PHQ-9 Score  1        07/06/2023    8:35 AM 06/07/2023    9:42 AM 02/08/2023    9:46 AM 12/22/2022    9:18 AM  GAD 7 : Generalized  Anxiety Score  Nervous, Anxious, on Edge 0 0 0 0  Control/stop worrying 0 0 0 0  Worry too much - different things 0  0 0  Trouble relaxing 0 0 0 0  Restless 0 0 0 0  Easily annoyed or irritable 0 0 0 0  Afraid - awful might happen 0 0 0 0  Total GAD 7 Score 0  0 0  Anxiety Difficulty   Not difficult at all Not difficult at all    Social History   Tobacco Use   Smoking status: Every Day    Current packs/day: 0.75    Average packs/day: 0.8 packs/day for 31.0 years (23.3 ttl pk-yrs)    Types: Cigarettes    Start date: 06/21/1992   Smokeless tobacco: Never  Vaping Use   Vaping status: Never Used  Substance Use Topics   Alcohol use: Yes    Comment: 24 Oz daily   Drug use: Not Currently    Types: Marijuana    Review of Systems Per HPI unless specifically indicated above     Objective:    BP 136/88 (BP Location: Left Arm, Cuff Size: Normal)   Pulse (!) 101   Ht 5\' 2"  (1.575 m)  Wt 126 lb (57.2 kg)   SpO2 97%   BMI 23.05 kg/m   Wt Readings from Last 3 Encounters:  07/06/23 126 lb (57.2 kg)  06/07/23 122 lb (55.3 kg)  05/04/23 117 lb (53.1 kg)    Physical Exam Vitals and nursing note reviewed.  Constitutional:      General: She is not in acute distress.    Appearance: Normal appearance. She is well-developed. She is not diaphoretic.     Comments: Well-appearing, comfortable, cooperative  HENT:     Head: Normocephalic and atraumatic.  Eyes:     General:        Right eye: No discharge.        Left eye: No discharge.     Conjunctiva/sclera: Conjunctivae normal.  Cardiovascular:     Rate and Rhythm: Normal rate.  Pulmonary:     Effort: Pulmonary effort is normal.  Skin:    General: Skin is warm and dry.     Findings: No erythema or rash.  Neurological:     Mental Status: She is alert and oriented to person, place, and time.  Psychiatric:        Mood and Affect: Mood normal.        Behavior: Behavior normal.        Thought Content: Thought content normal.      Comments: Well groomed, good eye contact, normal speech and thoughts     Results for orders placed or performed in visit on 06/07/23  CBC with Differential/Platelet   Collection Time: 06/07/23 10:21 AM  Result Value Ref Range   WBC 10.8 3.8 - 10.8 Thousand/uL   RBC 3.70 (L) 3.80 - 5.10 Million/uL   Hemoglobin 14.0 11.7 - 15.5 g/dL   HCT 40.9 81.1 - 91.4 %   MCV 108.1 (H) 80.0 - 100.0 fL   MCH 37.8 (H) 27.0 - 33.0 pg   MCHC 35.0 32.0 - 36.0 g/dL   RDW 78.2 95.6 - 21.3 %   Platelets 305 140 - 400 Thousand/uL   MPV 11.7 7.5 - 12.5 fL   Neutro Abs 7,193 1,500 - 7,800 cells/uL   Absolute Lymphocytes 2,603 850 - 3,900 cells/uL   Absolute Monocytes 648 200 - 950 cells/uL   Eosinophils Absolute 248 15 - 500 cells/uL   Basophils Absolute 108 0 - 200 cells/uL   Neutrophils Relative % 66.6 %   Total Lymphocyte 24.1 %   Monocytes Relative 6.0 %   Eosinophils Relative 2.3 %   Basophils Relative 1.0 %  Vitamin B12   Collection Time: 06/07/23 10:21 AM  Result Value Ref Range   Vitamin B-12 222 200 - 1,100 pg/mL  Iron , TIBC and Ferritin Panel   Collection Time: 06/07/23 10:21 AM  Result Value Ref Range   Iron  130 40 - 190 mcg/dL   TIBC 086 578 - 469 mcg/dL (calc)   %SAT 38 16 - 45 % (calc)   Ferritin 211 16 - 232 ng/mL      Assessment & Plan:   Problem List Items Addressed This Visit     Chronic thoracic back pain (Left) - Primary (Chronic)   Relevant Medications   HYDROcodone -acetaminophen  (NORCO/VICODIN) 5-325 MG tablet   DDD (degenerative disc disease), thoracic (Chronic)   Relevant Medications   HYDROcodone -acetaminophen  (NORCO/VICODIN) 5-325 MG tablet   Other Visit Diagnoses       Vitamin B12 deficiency       Relevant Medications   cyanocobalamin  (VITAMIN B12) 1000 MCG/ML injection  Chronic Pain / Thoracic Back Pain Followed by [facility] for procedural intervention. Epidural injections have provided limited relief. Ablation procedure denied by  insurance.  I am managing her opioid pain management here as PCP while she is followed by Pain Management specialist. Controlled on hydrocodone -acetaminophen  5-325mg , taking 2-3 tablets daily, up to 4 tablets max per day. Patient reports medication is successful and well-tolerated. She is improving function while on med. -Continue to follow up with pain management specialist for potential ablation procedure. - Letter written today for Opioid Treatment Agreement, reviewed entire document with patient, she reviewed all rules and regulations, printed, signed. To be scanned  Vitamin B12 Deficiency Currently on B12 injections. -Continue B12 injections, transitioning to monthly injections (4 total months) after current supply is finished. Repeat B12 lab in future 1 month after last injection  Foot Callus Painful callus on foot. -Advise patient to soak foot and use pumice stone or emery board to gradually file down callus. -Consider referral to podiatry if callus persists or worsens.  Follow-up Patient to return in 3 months for medication review and management. Patient to message provider when refill is needed, typically 28 days from last pick-up.         No orders of the defined types were placed in this encounter.   Meds ordered this encounter  Medications   HYDROcodone -acetaminophen  (NORCO/VICODIN) 5-325 MG tablet    Sig: Take 1 tablet by mouth every 4 (four) hours as needed for moderate pain (pain score 4-6) or severe pain (pain score 7-10). Max dose is 4 per day.    Dispense:  90 tablet    Refill:  0   cyanocobalamin  (VITAMIN B12) 1000 MCG/ML injection    Sig: Inject 1 mL (1,000 mcg total) into the muscle every 30 (thirty) days. For 4 months    Dispense:  4 mL    Refill:  0    Follow up plan: Return in about 3 months (around 10/04/2023) for 3 month Back Pain, Med refill.    Domingo Friend, DO [facility] 07/06/2023, 9:01 AM

## 2023-07-06 NOTE — Patient Instructions (Addendum)
 Thank you for coming to the office today.  New rx B12 sent for monthly for 4 months. We can repeat lab B12 1 month after final dose.  Keep in touch on pain medication renewal in future. Notify office in advance mychart preferred. See you in person every 3 months to maintain rx.   Please schedule a Follow-up Appointment to: Return in about 3 months (around 10/04/2023) for 3 month Back Pain, Med refill.  If you have any other questions or concerns, please feel free to call the office or send a message through MyChart. You may also schedule an earlier appointment if necessary.  Additionally, you may be receiving a survey about your experience at our office within a few days to 1 week by e-mail or mail. We value your feedback.  Domingo Friend, DO [facility], [place]

## 2023-07-06 NOTE — Telephone Encounter (Signed)
 Requested medication (s) are due for refill today: yes  Requested medication (s) are on the active medication list: yes  Last refill:  03/11/23  Future visit scheduled: yes  Notes to clinic:  Unable to refill per protocol, cannot delegate.      Requested Prescriptions  Pending Prescriptions Disp Refills   tiZANidine  (ZANAFLEX ) 4 MG tablet [Pharmacy Med Name: TIZANIDINE  HCL 4 MG TABLET] 90 tablet 0    Sig: TAKE ONE TABLET EVERY 8 HOURS AS NEEDED FOR MUSCLE SPASM.     Not Delegated - Cardiovascular:  Alpha-2 Agonists - tizanidine  Failed - 07/06/2023 11:35 AM      Failed - This refill cannot be delegated      Passed - Valid encounter within last 6 months    Recent Outpatient Visits           Today Chronic left-sided thoracic back pain   [facility] Raina Bunting, DO   4 weeks ago Oral thrush   [place] Karamalegos, Alexander J, DO   4 months ago Essential hypertension   [place] [facility] Raina Bunting, DO   6 months ago Essential hypertension   [place] [facility] Raina Bunting, DO   6 months ago Chronic left-sided thoracic back pain   [facility] at [facility], Dessie Flow, MD

## 2023-07-12 DIAGNOSIS — H547 Unspecified visual loss: Secondary | ICD-10-CM | POA: Diagnosis not present

## 2023-07-12 DIAGNOSIS — H40009 Preglaucoma, unspecified, unspecified eye: Secondary | ICD-10-CM | POA: Diagnosis not present

## 2023-07-13 ENCOUNTER — Encounter: Payer: Self-pay | Admitting: Oncology

## 2023-07-14 ENCOUNTER — Other Ambulatory Visit (INDEPENDENT_AMBULATORY_CARE_PROVIDER_SITE_OTHER): Payer: Self-pay | Admitting: Ophthalmology

## 2023-07-14 DIAGNOSIS — H547 Unspecified visual loss: Secondary | ICD-10-CM

## 2023-07-15 ENCOUNTER — Ambulatory Visit (INDEPENDENT_AMBULATORY_CARE_PROVIDER_SITE_OTHER): Payer: 59

## 2023-07-15 DIAGNOSIS — H547 Unspecified visual loss: Secondary | ICD-10-CM

## 2023-07-15 DIAGNOSIS — H3582 Retinal ischemia: Secondary | ICD-10-CM | POA: Diagnosis not present

## 2023-07-19 ENCOUNTER — Encounter: Payer: Self-pay | Admitting: Family Medicine

## 2023-07-19 DIAGNOSIS — H3582 Retinal ischemia: Secondary | ICD-10-CM | POA: Diagnosis not present

## 2023-07-22 ENCOUNTER — Encounter (INDEPENDENT_AMBULATORY_CARE_PROVIDER_SITE_OTHER): Payer: Self-pay | Admitting: Vascular Surgery

## 2023-07-22 ENCOUNTER — Ambulatory Visit (INDEPENDENT_AMBULATORY_CARE_PROVIDER_SITE_OTHER): Payer: 59 | Admitting: Vascular Surgery

## 2023-07-22 VITALS — BP 169/88 | HR 94 | Resp 16 | Ht 62.0 in | Wt 123.2 lb

## 2023-07-22 DIAGNOSIS — I771 Stricture of artery: Secondary | ICD-10-CM | POA: Diagnosis not present

## 2023-07-22 DIAGNOSIS — I701 Atherosclerosis of renal artery: Secondary | ICD-10-CM | POA: Diagnosis not present

## 2023-07-22 DIAGNOSIS — I6523 Occlusion and stenosis of bilateral carotid arteries: Secondary | ICD-10-CM | POA: Insufficient documentation

## 2023-07-22 DIAGNOSIS — I161 Hypertensive emergency: Secondary | ICD-10-CM

## 2023-07-22 DIAGNOSIS — F17200 Nicotine dependence, unspecified, uncomplicated: Secondary | ICD-10-CM

## 2023-07-22 DIAGNOSIS — E785 Hyperlipidemia, unspecified: Secondary | ICD-10-CM

## 2023-07-22 NOTE — Assessment & Plan Note (Signed)
Still with some hypertension but improved after renal artery intervention 6 months ago.

## 2023-07-22 NOTE — Assessment & Plan Note (Signed)
We had a discussion for approximately 4-5 minutes regarding the absolute need for smoking cessation due to the deleterious nature of tobacco on the vascular system. We discussed the tobacco use would diminish patency of any intervention, and likely significantly worsen progressio of disease. We discussed multiple agents for quitting including replacement therapy or medications to reduce cravings such as Chantix. The patient voices their understanding of the importance of smoking cessation.  

## 2023-07-22 NOTE — Progress Notes (Signed)
MRN : [id number]  Joanna Garcia is a 48 y.o. ([birth year]) female who presents with chief complaint of  Chief Complaint  Patient presents with   New Patient (Initial Visit)    Ref Dingeldein consult occular ischemic syndrome  .  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  She underwent a duplex which showed bilateral carotid artery occlusions and velocities that are worrisome for hemodynamically significant right subclavian artery stenosis.  The left subclavian artery appeared to be within normal limits.  Both vertebral arteries were antegrade.  The patient has a previous history of a carotid stent placement on the right some years ago but by duplex this appears to be occluded.  Her ocular symptoms are intermittent but severe.  They are always in the right eye.  The patient has a previous history of stroke in the past as well. I also treated her for renal artery stenosis with a renal artery stent about 6 months ago.  She came in with a hypertensive crisis.  After stenting, her blood pressure has been significantly improved.  She has not had a follow-up duplex to evaluate this in the office.  Current Outpatient Medications  Medication Sig Dispense Refill   amLODipine (NORVASC) 5 MG tablet Take 1 tablet (5 mg total) by mouth daily. 90 tablet 1   aspirin 81 MG chewable tablet Chew and swallow 1 tablet (81 mg total) by mouth daily. 30 tablet 2   atorvastatin (LIPITOR) 80 MG tablet Take 1 tablet (80 mg total) by mouth daily. 90 tablet 1   clopidogrel (PLAVIX) 75 MG tablet Take 1 tablet (75 mg total) by mouth daily. 30 tablet 0   cyanocobalamin (VITAMIN B12) 1000 MCG/ML injection Inject 1 mL (1,000 mcg total) into the muscle every 30 (thirty) days. For 4 months 4 mL 0   gabapentin (NEURONTIN) 300 MG capsule Take 3 caps = 900mg  dose in morning and afternoon and take 4 caps = 1200mg  in evening. Max dose is 10 capsules in 24 hours.  300 capsule 5   HYDROcodone-acetaminophen (NORCO/VICODIN) 5-325 MG tablet Take 1 tablet by mouth every 4 (four) hours as needed for moderate pain (pain score 4-6) or severe pain (pain score 7-10). Max dose is 4 per day. 90 tablet 0   meloxicam (MOBIC) 15 MG tablet TAKE ONE TABLET DAILY AS NEEDED. 30 tablet 2   ondansetron (ZOFRAN-ODT) 4 MG disintegrating tablet Take 1 tablet (4 mg total) by mouth every 8 (eight) hours as needed for nausea or vomiting. 30 tablet 2   pantoprazole (PROTONIX) 40 MG tablet Take 1 tablet (40 mg total) by mouth daily before breakfast. 90 tablet 3   tiZANidine (ZANAFLEX) 4 MG tablet TAKE ONE TABLET EVERY 8 HOURS AS NEEDED FOR MUSCLE SPASM. 90 tablet 2   ezetimibe (ZETIA) 10 MG tablet Take 1 tablet (10 mg total) by mouth daily. 90 tablet 3   fluconazole (DIFLUCAN) 150 MG tablet Take 1 tablet (150 mg total) by mouth every other day. (Patient not taking: Reported on 07/22/2023) 7 tablet 0   losartan (COZAAR) 25 MG tablet Take 1 tablet (25 mg total) by mouth daily. 90 tablet 3   No current facility-administered medications for this visit.    Past Medical History:  Diagnosis Date   DVT (deep venous thrombosis) (HCC)    GERD (gastroesophageal reflux disease)    Hypertension    Polycythemia    Polyneuropathy    Stroke (HCC)  Past Surgical History:  Procedure Laterality Date   COLONOSCOPY N/A 03/16/2019   Procedure: COLONOSCOPY;  Surgeon: Toledo, Boykin Nearing, MD;  Location: [facility] ENDOSCOPY;  Service: Gastroenterology;  Laterality: N/A;   ESOPHAGOGASTRODUODENOSCOPY N/A 03/16/2019   Procedure: ESOPHAGOGASTRODUODENOSCOPY (EGD);  Surgeon: Toledo, Boykin Nearing, MD;  Location: [facility] ENDOSCOPY;  Service: Gastroenterology;  Laterality: N/A;   IR ANGIO INTRA EXTRACRAN SEL COM CAROTID INNOMINATE UNI L MOD SED  02/09/2021   IR INTRAVSC STENT CERV CAROTID W/EMB-PROT MOD SED INCL ANGIO  02/09/2021   IR PERCUTANEOUS ART THROMBECTOMY/INFUSION INTRACRANIAL INC DIAG ANGIO  02/09/2021   IR US  GUIDE VASC ACCESS RIGHT  02/09/2021   NO PAST SURGERIES     RADIOLOGY WITH ANESTHESIA N/A 02/09/2021   Procedure: IR WITH ANESTHESIA;  Surgeon: Radiologist, Medication, MD;  Location: [facility];  Service: Radiology;  Laterality: N/A;   RENAL ANGIOGRAPHY Left 01/24/2023   Procedure: RENAL ANGIOGRAPHY;  Surgeon: Annice Needy, MD;  Location: [facility] INVASIVE CV LAB;  Service: Cardiovascular;  Laterality: Left;     Social History   Tobacco Use   Smoking status: Every Day    Current packs/day: 0.75    Average packs/day: 0.8 packs/day for 31.1 years (23.3 ttl pk-yrs)    Types: Cigarettes    Start date: 06/21/1992   Smokeless tobacco: Never  Vaping Use   Vaping status: Never Used  Substance Use Topics   Alcohol use: Yes    Comment: 24 Oz daily   Drug use: Not Currently    Types: Marijuana       Family History  Problem Relation Age of Onset   Hypertension Mother    Hyperlipidemia Mother    Heart disease Mother    COPD Mother    Hemochromatosis Mother    Hypertension Father    Hyperlipidemia Father    Cancer Father        prostate   Hemochromatosis Maternal Aunt    Hemochromatosis Maternal Aunt    Hemochromatosis Maternal Uncle    Leukemia Paternal Uncle    Cancer Paternal Grandmother    Breast cancer Paternal Grandmother      No Known Allergies   REVIEW OF SYSTEMS (Negative unless checked)  Constitutional: [] Weight loss  [] Fever  [] Chills Cardiac: [] Chest pain   [] Chest pressure   [] Palpitations   [] Shortness of breath when laying flat   [] Shortness of breath at rest   [] Shortness of breath with exertion. 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   [] Dysphagia   [] Weakness or numbness in arms   [] Weakness or numbness in legs Musculoskeletal:  [] Arthritis   [] Joint swelling   [] Joint pain   [] Low back pain Hematologic:  [] Easy bruising  [] Easy bleeding   [] Hypercoagulable state   [] Anemic   Gastrointestinal:  [] Blood in stool   [] Vomiting blood  [x] Gastroesophageal reflux/heartburn   [] Abdominal pain Genitourinary:  [] Chronic kidney disease   []   Difficult urination  [] Frequent urination  [] Burning with urination   [] Hematuria Skin:  [] Rashes   [] Ulcers   [] Wounds Psychological:  [] History of anxiety   []  History of major depression.  Physical Examination  BP (!) 169/88   Pulse 94   Resp 16   Ht 5\' 2"  (1.575 m)   Wt 123 lb 3.2 oz (55.9 kg)   BMI 22.53 kg/m  Gen:  WD/WN, NAD. Appears older than stated age. Head: [place]/AT, No temporalis wasting. Ear/Nose/Throat: Hearing grossly intact, nares w/o erythema or drainage Eyes: Conjunctiva clear. Sclera non-icteric Neck: Supple.  Trachea midline Pulmonary:  Good air movement, no use of accessory muscles.  Cardiac: RRR, no JVD Vascular:  Vessel Right Left  Radial Palpable Palpable           Musculoskeletal: M/S 5/5 throughout.  No deformity or atrophy. No edema. Neurologic: Sensation grossly intact in extremities.  Symmetrical.  Speech is fluent.  Psychiatric: Judgment intact, Mood & affect appropriate for pt's clinical situation. Dermatologic: No rashes or ulcers noted.  No cellulitis or open wounds.      Labs Recent Results (from the past 2160 hours)  CBC with Differential/Platelet     Status: Abnormal   Collection Time: 06/07/23 10:21 AM  Result Value Ref Range   WBC 10.8 3.8 - 10.8 Thousand/uL   RBC 3.70 (L) 3.80 - 5.10 Million/uL   Hemoglobin 14.0 11.7 - 15.5 g/dL   HCT 98.1 19.1 - 47.8 %   MCV 108.1 (H) 80.0 - 100.0 fL    MCH 37.8 (H) 27.0 - 33.0 pg   MCHC 35.0 32.0 - 36.0 g/dL    Comment: For adults, a slight decrease in the calculated MCHC value (in the range of 30 to 32 g/dL) is most likely not clinically significant; however, it should be interpreted with caution in correlation with other red cell parameters and the patient's clinical condition.    RDW 13.2 11.0 - 15.0 %   Platelets 305 140 - 400 Thousand/uL   MPV 11.7 7.5 - 12.5 fL   Neutro Abs 7,193 1,500 - 7,800 cells/uL   Absolute Lymphocytes 2,603 850 - 3,900 cells/uL   Absolute Monocytes 648 200 - 950 cells/uL   Eosinophils Absolute 248 15 - 500 cells/uL   Basophils Absolute 108 0 - 200 cells/uL   Neutrophils Relative % 66.6 %   Total Lymphocyte 24.1 %   Monocytes Relative 6.0 %   Eosinophils Relative 2.3 %   Basophils Relative 1.0 %  Vitamin B12     Status: None   Collection Time: 06/07/23 10:21 AM  Result Value Ref Range   Vitamin B-12 222 200 - 1,100 pg/mL    Comment: . Please Note: Although the reference range for vitamin B12 is [phone] pg/mL, it has been reported that between 5 and 10% of patients with values between 200 and 400 pg/mL may experience neuropsychiatric and hematologic abnormalities due to occult B12 deficiency; less than 1% of patients with values above 400 pg/mL will have symptoms. .   Iron, TIBC and Ferritin Panel     Status: None   Collection Time: 06/07/23 10:21 AM  Result Value Ref Range   Iron 130 40 - 190 mcg/dL   TIBC 295 621 - 308 mcg/dL (calc)   %SAT 38 16 - 45 % (calc)   Ferritin 211 16 - 232 ng/mL    Radiology VAS US CAROTID Result Date: 07/18/2023 Carotid Arterial Duplex Study Patient Name:  Lum Babe Naef  Date of Exam:  07/15/2023 Medical Rec #: [id number]        Accession #:    [id number] Date of Birth: [birth year]        Patient Gender: F Patient Age:   20 years Exam Location:  [facility] Procedure:      VAS US CAROTID Referring Phys: Sallee Lange  --------------------------------------------------------------------------------  Performing Technologist: Salvadore Farber RVT  Examination Guidelines: A complete evaluation includes B-mode imaging, spectral Doppler, color Doppler, and power Doppler as needed of all accessible portions of each vessel. Bilateral testing is considered an integral part of a complete examination. Limited examinations for reoccurring indications may be performed as noted.  Right Carotid Findings: +----------+--------+--------+--------+------------------+--------+           PSV cm/sEDV cm/sStenosisPlaque DescriptionComments +----------+--------+--------+--------+------------------+--------+ CCA Prox                  Occluded                           +----------+--------+--------+--------+------------------+--------+ CCA Mid                   Occluded                           +----------+--------+--------+--------+------------------+--------+ CCA Distal                Occluded                           +----------+--------+--------+--------+------------------+--------+ ICA Prox                  Occluded                  stent    +----------+--------+--------+--------+------------------+--------+ ICA Mid                                             stent    +----------+--------+--------+--------+------------------+--------+ ECA       81      10                                         +----------+--------+--------+--------+------------------+--------+ +----------+--------+-------+--------+-------------------+           PSV cm/sEDV cmsDescribeArm Pressure (mmHG) +----------+--------+-------+--------+-------------------+ Subclavian320            Stenotic                    +----------+--------+-------+--------+-------------------+ +---------+--------+--+--------+--+---------+ VertebralPSV cm/s66EDV cm/s26Antegrade +---------+--------+--+--------+--+---------+  Left Carotid  Findings: +----------+--------+--------+--------+------------------+--------+           PSV cm/sEDV cm/sStenosisPlaque DescriptionComments +----------+--------+--------+--------+------------------+--------+ CCA Prox                  Occluded                           +----------+--------+--------+--------+------------------+--------+ CCA Mid                   Occluded                           +----------+--------+--------+--------+------------------+--------+ CCA Distal  Occluded                           +----------+--------+--------+--------+------------------+--------+ ICA Prox  86      29                                         +----------+--------+--------+--------+------------------+--------+ ICA Mid   64      25                                         +----------+--------+--------+--------+------------------+--------+ ICA Distal66      33                                         +----------+--------+--------+--------+------------------+--------+ ECA       126     55                                         +----------+--------+--------+--------+------------------+--------+ +----------+--------+--------+----------------+-------------------+           PSV cm/sEDV cm/sDescribe        Arm Pressure (mmHG) +----------+--------+--------+----------------+-------------------+ Subclavian204             Multiphasic, WNL                    +----------+--------+--------+----------------+-------------------+ +---------+--------+---+--------+--+---------+ VertebralPSV cm/s100EDV cm/s40Antegrade +---------+--------+---+--------+--+---------+   Summary: Right Carotid: Evidence consistent with a total occlusion of the right ICA. The                CCA appears occluded. The ECA appears <50% stenosed. CCA and ICA                plus stent are totally occluded. New finding since placement of                stent in 2022. Left Carotid: The CCA  appears occluded. The ECA appears <50% stenosed. CCA is               totally occluded to the bulb. ICA is patent and being fed by               retrograde flow from the ECA. Strong vertebral flow seen. Vertebrals:  Bilateral vertebral arteries demonstrate antegrade flow. Subclavians: Right subclavian artery was stenotic. Normal flow hemodynamics were              seen in the left subclavian artery. *See table(s) above for measurements and observations.  *Suggest Vascular Consult.* Electronically signed by Levora Dredge MD on 07/18/2023 at 9:01:05 AM.    Final     Assessment/Plan  Carotid occlusion, bilateral The patient has recurring ocular ischemic symptoms.  A recent carotid duplex suggested bilateral carotid artery occlusion as well as significant stenosis of the right subclavian artery.  This is an extremely severe and worrisome situation.  She is already had a carotid stent placed on the right and it appears to have occluded.  This is a critical and life-threatening situation and we will plan on an angiogram in the near future for further evaluation of her cerebral perfusion.  This may also allow treatment for her right subclavian artery stenosis if present.  I have stressed to her the absolute need for smoking cessation and that if she continues to smoke this will not be a durable intervention.  I will also probably increase her anticoagulants and add Eliquis after the procedure.  Risks and benefits of the procedure were discussed and informed consent was obtained.  Subclavian artery stenosis, right [facility]) The patient has recurring ocular ischemic symptoms.  A recent carotid duplex suggested bilateral carotid artery occlusion as well as significant stenosis of the right subclavian artery.  This is an extremely severe and worrisome situation.  She is already had a carotid stent placed on the right and it appears to have occluded.  This is a critical and life-threatening situation and we will plan on  an angiogram in the near future for further evaluation of her cerebral perfusion.  This may also allow treatment for her right subclavian artery stenosis if present.  I have stressed to her the absolute need for smoking cessation and that if she continues to smoke this will not be a durable intervention.  I will also probably increase her anticoagulants and add Eliquis after the procedure.  Risks and benefits of the procedure were discussed and informed consent was obtained.  Renal artery stenosis (HCC) Intervention performed about 6 months ago with good results and improvement in her blood pressure control.  Once we have addressed her cerebrovascular issues, a renal artery duplex as an outpatient in the future should be performed.  Hypertensive emergency Still with some hypertension but improved after renal artery intervention 6 months ago.  Smoking We had a discussion for approximately 4-5 minutes regarding the absolute need for smoking cessation due to the deleterious nature of tobacco on the vascular system. We discussed the tobacco use would diminish patency of any intervention, and likely significantly worsen progressio of disease. We discussed multiple agents for quitting including replacement therapy or medications to reduce cravings such as Chantix. The patient voices their understanding of the importance of smoking cessation.   Dyslipidemia lipid control important in reducing the progression of atherosclerotic disease. Continue statin therapy     Festus Barren, MD  07/22/2023 10:04 AM    This note was created with Dragon medical transcription system.  Any errors from dictation are purely unintentional

## 2023-07-22 NOTE — Assessment & Plan Note (Signed)
The patient has recurring ocular ischemic symptoms.  A recent carotid duplex suggested bilateral carotid artery occlusion as well as significant stenosis of the right subclavian artery.  This is an extremely severe and worrisome situation.  She is already had a carotid stent placed on the right and it appears to have occluded.  This is a critical and life-threatening situation and we will plan on an angiogram in the near future for further evaluation of her cerebral perfusion.  This may also allow treatment for her right subclavian artery stenosis if present.  I have stressed to her the absolute need for smoking cessation and that if she continues to smoke this will not be a durable intervention.  I will also probably increase her anticoagulants and add Eliquis after the procedure.  Risks and benefits of the procedure were discussed and informed consent was obtained.

## 2023-07-22 NOTE — Assessment & Plan Note (Signed)
 lipid control important in reducing the progression of atherosclerotic disease. Continue statin therapy

## 2023-07-22 NOTE — Assessment & Plan Note (Addendum)
Intervention performed about 6 months ago with good results and improvement in her blood pressure control.  Once we have addressed her cerebrovascular issues, a renal artery duplex as an outpatient in the future should be performed.

## 2023-07-25 ENCOUNTER — Encounter: Payer: Self-pay | Admitting: Oncology

## 2023-07-25 ENCOUNTER — Telehealth (INDEPENDENT_AMBULATORY_CARE_PROVIDER_SITE_OTHER): Payer: Self-pay

## 2023-07-25 NOTE — Telephone Encounter (Signed)
Spoke with the patient and she is scheduled with Dr. Wyn Quaker on 07/28/23 for a bilateral carotid angio and poss subclavian stent at the [facility] with a 11:00 am arrival time. Pre-procedure instructions were discussed and will be sent to Mychart.

## 2023-07-26 ENCOUNTER — Other Ambulatory Visit: Payer: Self-pay | Admitting: Family Medicine

## 2023-07-26 DIAGNOSIS — I6930 Unspecified sequelae of cerebral infarction: Secondary | ICD-10-CM

## 2023-07-26 DIAGNOSIS — E785 Hyperlipidemia, unspecified: Secondary | ICD-10-CM

## 2023-07-27 NOTE — Telephone Encounter (Signed)
 Requested Prescriptions  Pending Prescriptions Disp Refills   atorvastatin  (LIPITOR ) 80 MG tablet [Pharmacy Med Name: ATORVASTATIN  80 MG TABLET] 90 tablet 0    Sig: Take 1 tablet (80 mg total) by mouth daily.     Cardiovascular:  Antilipid - Statins Failed - 07/27/2023  4:14 PM      Failed - Lipid Panel in normal range within the last 12 months    Cholesterol  Date Value Ref Range Status  01/22/2023 218 (H) 0 - 200 mg/dL Final   LDL Cholesterol (Calc)  Date Value Ref Range Status  12/22/2022 151 (H) mg/dL (calc) Final    Comment:    Reference range: <100 . Desirable range <100 mg/dL for primary prevention;   <70 mg/dL for patients with CHD or diabetic patients  with > or = 2 CHD risk factors. SABRA LDL-C is now calculated using the Martin-Hopkins  calculation, which is a validated novel method providing  better accuracy than the Friedewald equation in the  estimation of LDL-C.  Gladis APPLETHWAITE et al. SANDREA. 7986;689(80): 2061-2068  (http://education.[facility].com/faq/FAQ164)    LDL Cholesterol  Date Value Ref Range Status  01/22/2023 96 0 - 99 mg/dL Final    Comment:           Total Cholesterol/HDL:CHD Risk Coronary Heart Disease Risk Table                     Men   Women  1/2 Average Risk   3.4   3.3  Average Risk       5.0   4.4  2 X Average Risk   9.6   7.1  3 X Average Risk  23.4   11.0        Use the calculated Patient Ratio above and the CHD Risk Table to determine the patient's CHD Risk.        ATP III CLASSIFICATION (LDL):  <100     mg/dL   Optimal  899-870  mg/dL   Near or Above                    Optimal  130-159  mg/dL   Borderline  839-810  mg/dL   High  >809     mg/dL   Very High Performed at [facility], [address] [name], [place] [address]    HDL  Date Value Ref Range Status  01/22/2023 81 >40 mg/dL Final   Triglycerides  Date Value Ref Range Status  01/22/2023 203 (H) <150 mg/dL Final         Passed - Patient is not  pregnant      Passed - Valid encounter within last 12 months    Recent Outpatient Visits           3 weeks ago Chronic left-sided thoracic back pain   [place] [facility] Ed[place] Marsa PARAS, DO   1 month ago Oral thrush   [place] [facility] Karamalegos, Alexander J, DO   5 months ago Essential hypertension   [place] [facility] Marsa PARAS, DO   7 months ago Essential hypertension   [facility] Marsa PARAS, DO   7 months ago Chronic left-sided thoracic back pain   [facility], Selinda PARAS, MD               clopidogrel  (PLAVIX ) 75  MG tablet [Pharmacy Med Name: CLOPIDOGREL  75 MG TABLET] 30 tablet 0    Sig: Take 1 tablet (75 mg total) by mouth daily.     Hematology: Antiplatelets - clopidogrel  Passed - 07/27/2023  4:14 PM      Passed - HCT in normal range and within 180 days    HCT  Date Value Ref Range Status  06/07/2023 40.0 35.0 - 45.0 % Final         Passed - HGB in normal range and within 180 days    Hemoglobin  Date Value Ref Range Status  06/07/2023 14.0 11.7 - 15.5 g/dL Final  93/96/7975 84.7 (H) 12.0 - 15.0 g/dL Final         Passed - PLT in normal range and within 180 days    Platelets  Date Value Ref Range Status  06/07/2023 305 140 - 400 Thousand/uL Final   Platelet Count  Date Value Ref Range Status  11/22/2022 208 150 - 400 K/uL Final         Passed - Cr in normal range and within 360 days    Creat  Date Value Ref Range Status  12/22/2022 0.92 0.50 - 0.99 mg/dL Final   Creatinine, Ser  Date Value Ref Range Status  01/25/2023 0.81 0.44 - 1.00 mg/dL Final         Passed - Valid encounter within last 6 months    Recent Outpatient Visits           3 weeks ago Chronic left-sided thoracic back pain   [place] [facility] [place] Marsa PARAS, DO   1  month ago Oral thrush   [place] [facility] Karamalegos, Marsa PARAS, DO   5 months ago Essential hypertension   [facility] [place] Marsa PARAS, DO   7 months ago Essential hypertension   [place] [facility] Marsa PARAS, DO   7 months ago Chronic left-sided thoracic back pain   [facility] at [facility], Selinda PARAS, MD

## 2023-07-28 ENCOUNTER — Encounter: Payer: Self-pay | Admitting: Vascular Surgery

## 2023-07-28 ENCOUNTER — Ambulatory Visit
Admission: RE | Admit: 2023-07-28 | Discharge: 2023-07-28 | Disposition: A | Discharge status: Home / Self Care | Payer: 59 | Attending: Vascular Surgery | Admitting: Vascular Surgery

## 2023-07-28 ENCOUNTER — Encounter
Admission: RE | Disposition: A | Discharge status: Home / Self Care | Payer: Self-pay | Source: Home / Self Care | Attending: Vascular Surgery

## 2023-07-28 DIAGNOSIS — I6523 Occlusion and stenosis of bilateral carotid arteries: Secondary | ICD-10-CM | POA: Diagnosis not present

## 2023-07-28 DIAGNOSIS — E785 Hyperlipidemia, unspecified: Secondary | ICD-10-CM | POA: Diagnosis not present

## 2023-07-28 DIAGNOSIS — Q2549 Other congenital malformations of aorta: Secondary | ICD-10-CM

## 2023-07-28 DIAGNOSIS — I708 Atherosclerosis of other arteries: Secondary | ICD-10-CM

## 2023-07-28 DIAGNOSIS — Z8673 Personal history of transient ischemic attack (TIA), and cerebral infarction without residual deficits: Secondary | ICD-10-CM | POA: Insufficient documentation

## 2023-07-28 DIAGNOSIS — T82858A Stenosis of vascular prosthetic devices, implants and grafts, initial encounter: Secondary | ICD-10-CM | POA: Diagnosis not present

## 2023-07-28 DIAGNOSIS — I701 Atherosclerosis of renal artery: Secondary | ICD-10-CM | POA: Insufficient documentation

## 2023-07-28 DIAGNOSIS — Z9889 Other specified postprocedural states: Secondary | ICD-10-CM

## 2023-07-28 DIAGNOSIS — I70201 Unspecified atherosclerosis of native arteries of extremities, right leg: Secondary | ICD-10-CM | POA: Diagnosis not present

## 2023-07-28 DIAGNOSIS — I771 Stricture of artery: Secondary | ICD-10-CM | POA: Diagnosis not present

## 2023-07-28 DIAGNOSIS — I161 Hypertensive emergency: Secondary | ICD-10-CM | POA: Diagnosis not present

## 2023-07-28 DIAGNOSIS — Z79899 Other long term (current) drug therapy: Secondary | ICD-10-CM | POA: Insufficient documentation

## 2023-07-28 DIAGNOSIS — F17210 Nicotine dependence, cigarettes, uncomplicated: Secondary | ICD-10-CM | POA: Diagnosis not present

## 2023-07-28 DIAGNOSIS — Z7901 Long term (current) use of anticoagulants: Secondary | ICD-10-CM | POA: Insufficient documentation

## 2023-07-28 HISTORY — PX: CAROTID ANGIOGRAPHY: CATH118230

## 2023-07-28 LAB — BUN: BUN: 7 mg/dL (ref 6–20)

## 2023-07-28 LAB — CREATININE, SERUM
Creatinine, Ser: 0.75 mg/dL (ref 0.44–1.00)
GFR, Estimated: >60 mL/min (ref >60)

## 2023-07-28 SURGERY — CAROTID ANGIOGRAPHY
Anesthesia: Moderate Sedation | Laterality: Bilateral

## 2023-07-28 MED ORDER — FENTANYL CITRATE (PF) 100 MCG/2ML IJ SOLN
INTRAMUSCULAR | Status: AC
  Filled 2023-07-28: qty 2

## 2023-07-28 MED ORDER — LIDOCAINE-EPINEPHRINE (PF) 1 %-1:200000 IJ SOLN
INTRAMUSCULAR | Status: DC | PRN
  Administered 2023-07-28: 10 mL

## 2023-07-28 MED ORDER — HEPARIN SODIUM (PORCINE) 1000 UNIT/ML IJ SOLN
INTRAMUSCULAR | Status: DC | PRN
  Administered 2023-07-28: 6000 [IU] via INTRAVENOUS

## 2023-07-28 MED ORDER — PHENYLEPHRINE 80 MCG/ML (10ML) SYRINGE FOR IV PUSH (FOR BLOOD PRESSURE SUPPORT)
PREFILLED_SYRINGE | INTRAVENOUS | Status: AC
Start: 2023-07-28 — End: ?
  Filled 2023-07-28: qty 10

## 2023-07-28 MED ORDER — CEFAZOLIN SODIUM-DEXTROSE 2-4 GM/100ML-% IV SOLN
INTRAVENOUS | Status: AC
  Filled 2023-07-28: qty 100

## 2023-07-28 MED ORDER — CEFAZOLIN SODIUM-DEXTROSE 2-4 GM/100ML-% IV SOLN
2.0000 g | INTRAVENOUS | Status: AC
  Administered 2023-07-28: 2 g via INTRAVENOUS

## 2023-07-28 MED ORDER — IODIXANOL 320 MG/ML IV SOLN
INTRAVENOUS | Status: DC | PRN
  Administered 2023-07-28: 70 mL

## 2023-07-28 MED ORDER — DIPHENHYDRAMINE HCL 50 MG/ML IJ SOLN: 50.0000 mg | Freq: Once | INTRAMUSCULAR | Status: DC | PRN

## 2023-07-28 MED ORDER — HEPARIN (PORCINE) IN NACL 1000-0.9 UT/500ML-% IV SOLN
INTRAVENOUS | Status: DC | PRN
  Administered 2023-07-28: 2000 mL

## 2023-07-28 MED ORDER — MIDAZOLAM HCL 2 MG/ML PO SYRP: 8.0000 mg | ORAL_SOLUTION | Freq: Once | ORAL | Status: DC | PRN

## 2023-07-28 MED ORDER — MIDAZOLAM HCL 2 MG/2ML IJ SOLN
INTRAMUSCULAR | Status: AC
  Filled 2023-07-28: qty 2

## 2023-07-28 MED ORDER — FENTANYL CITRATE PF 50 MCG/ML IJ SOSY
PREFILLED_SYRINGE | INTRAMUSCULAR | Status: AC
  Filled 2023-07-28: qty 1

## 2023-07-28 MED ORDER — FENTANYL CITRATE (PF) 100 MCG/2ML IJ SOLN
INTRAMUSCULAR | Status: DC | PRN
  Administered 2023-07-28: 50 ug via INTRAVENOUS
  Administered 2023-07-28 (×2): 25 ug via INTRAVENOUS

## 2023-07-28 MED ORDER — ATROPINE SULFATE 1 MG/10ML IJ SOSY
PREFILLED_SYRINGE | INTRAMUSCULAR | Status: AC
  Filled 2023-07-28: qty 10

## 2023-07-28 MED ORDER — ONDANSETRON HCL 4 MG/2ML IJ SOLN: 4.0000 mg | Freq: Four times a day (QID) | INTRAMUSCULAR | Status: DC | PRN

## 2023-07-28 MED ORDER — HEPARIN SODIUM (PORCINE) 1000 UNIT/ML IJ SOLN
INTRAMUSCULAR | Status: AC
  Filled 2023-07-28: qty 10

## 2023-07-28 MED ORDER — MIDAZOLAM HCL 2 MG/2ML IJ SOLN
INTRAMUSCULAR | Status: DC | PRN
  Administered 2023-07-28 (×2): .5 mg via INTRAVENOUS
  Administered 2023-07-28: 2 mg via INTRAVENOUS

## 2023-07-28 MED ORDER — PHENYLEPHRINE HCL-NACL 20-0.9 MG/250ML-% IV SOLN
INTRAVENOUS | Status: AC
  Filled 2023-07-28: qty 250

## 2023-07-28 MED ORDER — SODIUM CHLORIDE 0.9 % IV SOLN: INTRAVENOUS | Status: DC

## 2023-07-28 MED ORDER — METHYLPREDNISOLONE SODIUM SUCC 125 MG IJ SOLR: 125.0000 mg | Freq: Once | INTRAMUSCULAR | Status: DC | PRN

## 2023-07-28 MED ORDER — DOPAMINE-DEXTROSE 3.2-5 MG/ML-% IV SOLN
INTRAVENOUS | Status: AC
  Filled 2023-07-28: qty 250

## 2023-07-28 MED ORDER — FAMOTIDINE 20 MG PO TABS: 40.0000 mg | ORAL_TABLET | Freq: Once | ORAL | Status: DC | PRN

## 2023-07-28 MED ORDER — HYDROMORPHONE HCL 1 MG/ML IJ SOLN: 1.0000 mg | Freq: Once | INTRAMUSCULAR | Status: DC | PRN

## 2023-07-28 SURGICAL SUPPLY — 18 items
BALLN LUTONIX DCB 5X80X130 (BALLOONS) ×1 IMPLANT
BALLN LUTONIX DCB 6X80X130 (BALLOONS) ×1 IMPLANT
BALLOON LUTONIX DCB 5X80X130 (BALLOONS) IMPLANT
BALLOON LUTONIX DCB 6X80X130 (BALLOONS) IMPLANT
CATH ANGIO 5F PIGTAIL 100CM (CATHETERS) IMPLANT
CATH BEACON 5 .035 100 H1 TIP (CATHETERS) IMPLANT
COVER PROBE ULTRASOUND 5X96 (MISCELLANEOUS) IMPLANT
DEVICE PRESTO INFLATION (MISCELLANEOUS) IMPLANT
DEVICE STARCLOSE SE CLOSURE (Vascular Products) IMPLANT
GLIDEWIRE ADV .035X260CM (WIRE) IMPLANT
GLIDEWIRE ANGLED SS 035X260CM (WIRE) IMPLANT
KIT CAROTID MANIFOLD (MISCELLANEOUS) IMPLANT
PACK ANGIOGRAPHY (CUSTOM PROCEDURE TRAY) ×1 IMPLANT
SHEATH BRITE TIP 6FRX11 (SHEATH) IMPLANT
SHEATH BRITE TIP 6FRX5.5 (SHEATH) IMPLANT
SHEATH RAABE 6FRX70 (SHEATH) IMPLANT
SYR MEDRAD MARK 7 150ML (SYRINGE) IMPLANT
WIRE GUIDERIGHT .035X150 (WIRE) IMPLANT

## 2023-07-28 NOTE — Op Note (Signed)
 Joanna Garcia   OPERATIVE NOTE  DATE: 07/28/2023  PRE-OPERATIVE DIAGNOSIS: 1. bilateral carotid artery occlusion 2.  Right subclavian artery stenosis  POST-OPERATIVE DIAGNOSIS: Same as above  PROCEDURE: 1.   Ultrasound Guidance for vascular access right femoral artery 2.   Catheter placement into innominate artery and right subclavian artery and into left subclavian artery right femoral approach 3.   Thoracic aortogram 4.   Cervical and cerebral right carotid angiogram 5.   Left subclavian artery and vertebral artery angiogram 6.   Right subclavian artery and vertebral artery angiogram 7.   Angioplasty of the right subclavian artery with 5 and 6 mm diameter Lutonix drug-coated angioplasty balloons 8.   StarClose closure device right femoral artery  SURGEON: Selinda Gu, MD  ASSISTANT(S): None  ANESTHESIA: Moderate conscious sedation  ESTIMATED BLOOD LOSS: 25 cc  FLUORO TIME: 4.8  CONTRAST: 70  MODERATE CONSCIOUS SEDATION TIME: Approximately 46 minutes using 3 mg of Versed  and 100 mcg of Fentanyl   FINDING(S): 1.  Left subclavian artery is widely patent and the left vertebral artery is massive and supplies the vast majority of the blood for the entire brain 2.  Left carotid artery is occluded at its origin without significant distal reconstitution 3.  Innominate artery is patent but the right common carotid artery occluded at its origin including the previously placed stent in the right distal common carotid artery and internal carotid artery remaining occluded.  There was some faint distal reconstitution through external carotid branches into the more distal right internal carotid artery, but no obvious intracranial filling was seen even on delayed images through the right carotid system. 4.  Right subclavian artery is heavily diseased with an approximately 80 to 85% stenosis proximal to the vertebral artery in about a 70 to 75% stenosis distal to the vertebral  artery.  The vertebral artery is fairly large but the intracranial flow is somewhat diminutive due to the subclavian lesion.  SPECIMEN(S):  None  INDICATIONS:   Patient is a 48 y.o.female who presents with an extensive history of cerebrovascular disease.  She has had previous right carotid stent placement about 2-1/2 years ago but a recent carotid ultrasound suggested this was occluded as well as the left carotid artery.  There is also suggestion of high-grade stenosis of the right subclavian artery.  She has been having visual symptoms on the right side.  Catheter-based angiogram is performed for further evaluation. Risks and benefits are discussed and informed consent was obtained.  DESCRIPTION: After obtaining full informed written consent, the patient was brought back to the operating room and placed supine upon the vascular suite table.  After obtaining adequate anesthesia, the patient was prepped and draped in the standard fashion.  Moderate conscious sedation was administered during a face to face encounter with the patient throughout the procedure with my supervision of the RN administering medicines and monitoring the patients vital signs and mental status throughout from the start of the procedure until the patient was taken to the recovery room. The right femoral artery was visualized with ultrasound and found to be calcific but patent. It was then accessed under direct ultrasound guidance without difficulty with a Seldinger needle. A J-wire and 5 French sheath were placed and a permanent image was recorded. The patient was given 6000 units of intravenous heparin . A pigtail catheter was placed into the ascending aorta and an LAO projection thoracic aortogram was performed. This showed a type II aortic arch.  The left common carotid  artery was occluded at its origin and no flow was seen in the left carotid system.  The innominate artery was patent but the right common carotid artery appeared to  occlude.  There were large vertebral arteries present.  I then selected a headhunter catheter and cannulated the innominate artery. Cervical and cerebral carotid angiography were then performed on the right side initially. The intracranial flow was found to be diminutive through the right carotid system and really only a small amount of flow was even getting to the intracranial portion through the vertebral artery. The cervical carotid artery was occluded in the common carotid artery and proximal portion of the internal carotid artery.  There was some reconstitution through external carotid artery collaterals of the distal internal carotid artery, but this did not feel intracranially.  I then transition to an RAO projection to help opacify the right subclavian artery and right vertebral artery better.  We advanced into the subclavian artery. Right subclavian artery is heavily diseased with an approximately 80 to 85% stenosis proximal to the vertebral artery in about a 70 to 75% stenosis distal to the vertebral artery.  The vertebral artery is fairly large but the intracranial flow is somewhat diminutive due to the subclavian lesion.  I then transition to the left subclavian artery to evaluate this and the left vertebral artery for intracranial filling.  This appeared to be dominant.  The headhunter catheter was used to cannulate the left subclavian artery where selective imaging is performed. Left subclavian artery is widely patent and the left vertebral artery is massive and supplies the vast majority of the blood for the entire brain At this point, we had imaging to plan our treatment.  With right sided visual symptoms I felt it would be prudent to try to address her right subclavian artery stenosis to improve the vertebral artery flow.  There may be options for her right subclavian to carotid bypass although without intracranial filling I am not sure that the distal cervical carotid artery would be an adequate  distal target for bypass in the future.  I then recannulated the innominate artery and advanced across the right subclavian artery stenosis without difficulty with an advantage wire and then placed a 6 French 70 cm sheath into the right subclavian artery.  With the lesion being both proximal and distal to the vertebral artery, angioplasty alone was preferred and so I started gently inflating this first with a 5 mm diameter by 8 cm length and then a 6 mm diameter by 8 cm length Lutonix drug-coated angioplasty balloon.  Completion imaging after angioplasty showed marked improvement with only about a 15 to 20% residual stenosis in the distal right subclavian artery beyond the vertebral artery and about a 30 to 35% residual stenosis in the proximal subclavian artery proximal to the vertebral artery.  I elected to terminate the procedure. Oblique arteriogram was performed of the right femoral artery and StarClose closure device was deployed in usual fashion with excellent hemostatic result. The patient tolerated the procedure well and was taken to the recovery room in stable condition.  COMPLICATIONS: None  CONDITION: Stable   Selinda Gu 07/28/2023 2:10 PM   This note was created with Dragon Medical transcription system. Any errors in dictation are purely unintentional.

## 2023-07-28 NOTE — Interval H&P Note (Signed)
 History and Physical Interval Note:  07/28/2023 10:56 AM  Joanna Garcia  has presented today for surgery, with the diagnosis of Bilateral Carotid Angio w possible R subclavian stent    Bil carotid occlusion.  The various methods of treatment have been discussed with the patient and family. After consideration of risks, benefits and other options for treatment, the patient has consented to  Procedure(s): CAROTID ANGIOGRAPHY (Bilateral) as a surgical intervention.  The patient's history has been reviewed, patient examined, no change in status, stable for surgery.  I have reviewed the patient's chart and labs.  Questions were answered to the patient's satisfaction.     Kylee Umana

## 2023-07-29 ENCOUNTER — Encounter: Payer: Self-pay | Admitting: Vascular Surgery

## 2023-08-02 DIAGNOSIS — H547 Unspecified visual loss: Secondary | ICD-10-CM | POA: Diagnosis not present

## 2023-08-03 ENCOUNTER — Other Ambulatory Visit: Payer: Self-pay | Admitting: Family Medicine

## 2023-08-03 DIAGNOSIS — G8929 Other chronic pain: Secondary | ICD-10-CM

## 2023-08-03 DIAGNOSIS — M5134 Other intervertebral disc degeneration, thoracic region: Secondary | ICD-10-CM

## 2023-08-03 MED ORDER — HYDROCODONE-ACETAMINOPHEN 5-325 MG PO TABS: 1.0000 | ORAL_TABLET | ORAL | 0 refills | Status: DC | PRN

## 2023-08-05 ENCOUNTER — Encounter: Payer: Self-pay | Admitting: Vascular Surgery

## 2023-08-08 ENCOUNTER — Other Ambulatory Visit: Payer: Self-pay | Admitting: Family Medicine

## 2023-08-08 DIAGNOSIS — G8929 Other chronic pain: Secondary | ICD-10-CM

## 2023-08-09 NOTE — Telephone Encounter (Signed)
 Requested Prescriptions  Pending Prescriptions Disp Refills   gabapentin (NEURONTIN) 300 MG capsule [Pharmacy Med Name: GABAPENTIN 300 MG CAPSULE] 300 capsule 2    Sig: Take 3 caps = 900mg  dose in morning and afternoon and take 4 caps = 1200mg  in evening. Max dose is 10 capsules in 24 hours.     Neurology: Anticonvulsants - gabapentin Passed - 08/09/2023  8:42 AM      Passed - Cr in normal range and within 360 days    Creat  Date Value Ref Range Status  12/22/2022 0.92 0.50 - 0.99 mg/dL Final   Creatinine, Ser  Date Value Ref Range Status  07/28/2023 0.75 0.44 - 1.00 mg/dL Final         Passed - Completed PHQ-2 or PHQ-9 in the last 360 days      Passed - Valid encounter within last 12 months    Recent Outpatient Visits           1 month ago Chronic left-sided thoracic back pain   [place] [facility] Smitty Cords, DO   2 months ago Oral thrush   [place] [facility] Smitty Cords, DO   6 months ago Essential hypertension   [place] [facility] Smitty Cords, DO   7 months ago Essential hypertension   [place] [facility] Smitty Cords, DO   8 months ago Chronic left-sided thoracic back pain   [facility] at [facility], Ocie Bob, MD

## 2023-08-17 ENCOUNTER — Other Ambulatory Visit: Payer: Self-pay | Admitting: Family Medicine

## 2023-08-17 DIAGNOSIS — R112 Nausea with vomiting, unspecified: Secondary | ICD-10-CM

## 2023-08-17 NOTE — Telephone Encounter (Signed)
 Requested medication (s) are due for refill today: Yes  Requested medication (s) are on the active medication list: Yes  Last refill:  11/18/22  Future visit scheduled: No  Notes to clinic:  Unable to refill per protocol, cannot delegate.      Requested Prescriptions  Pending Prescriptions Disp Refills   ondansetron (ZOFRAN-ODT) 4 MG disintegrating tablet [Pharmacy Med Name: ONDANSETRON ODT 4 MG TABLET] 30 tablet 0    Sig: TAKE 1 TABLET EVERY 8 HOURS AS NEEDED FOR NAUSEA AND VOMITING.     Not Delegated - Gastroenterology: Antiemetics - ondansetron Failed - 08/17/2023 12:57 PM      Failed - This refill cannot be delegated      Passed - AST in normal range and within 360 days    AST  Date Value Ref Range Status  01/23/2023 20 15 - 41 U/L Final         Passed - ALT in normal range and within 360 days    ALT  Date Value Ref Range Status  01/23/2023 12 0 - 44 U/L Final         Passed - Valid encounter within last 6 months    Recent Outpatient Visits           1 month ago Chronic left-sided thoracic back pain   [place] [facility] Smitty Cords, DO   2 months ago Oral thrush   [place] [facility] Smitty Cords, DO   6 months ago Essential hypertension   [place] [facility] Smitty Cords, DO   7 months ago Essential hypertension   [place] [facility] Smitty Cords, DO   8 months ago Chronic left-sided thoracic back pain   [place] at [facility], Ocie Bob, MD

## 2023-08-24 ENCOUNTER — Other Ambulatory Visit: Payer: Self-pay | Admitting: Family Medicine

## 2023-08-24 DIAGNOSIS — I10 Essential (primary) hypertension: Secondary | ICD-10-CM

## 2023-08-25 ENCOUNTER — Other Ambulatory Visit: Payer: Self-pay | Admitting: Family Medicine

## 2023-08-25 DIAGNOSIS — G8929 Other chronic pain: Secondary | ICD-10-CM

## 2023-08-25 DIAGNOSIS — M5134 Other intervertebral disc degeneration, thoracic region: Secondary | ICD-10-CM

## 2023-08-25 MED ORDER — HYDROCODONE-ACETAMINOPHEN 5-325 MG PO TABS: 1.0000 | ORAL_TABLET | ORAL | 0 refills | Status: DC | PRN

## 2023-08-25 NOTE — Telephone Encounter (Signed)
 Requested Prescriptions  Pending Prescriptions Disp Refills   amLODipine (NORVASC) 5 MG tablet [Pharmacy Med Name: AMLODIPINE BESYLATE 5 MG TAB] 90 tablet 0    Sig: Take 1 tablet (5 mg total) by mouth daily.     Cardiovascular: Calcium Channel Blockers 2 Failed - 08/25/2023 11:52 AM      Failed - Last BP in normal range    BP Readings from Last 1 Encounters:  07/28/23 (!) 149/66         Passed - Last Heart Rate in normal range    Pulse Readings from Last 1 Encounters:  07/28/23 87         Passed - Valid encounter within last 6 months    Recent Outpatient Visits           1 month ago Chronic left-sided thoracic back pain   [place] [facility] [place], Netta Neat, DO   2 months ago Oral thrush   [place] [facility] Smitty Cords, DO   6 months ago Essential hypertension   [place] [facility] Smitty Cords, DO   8 months ago Essential hypertension   [place] [facility] Smitty Cords, DO   8 months ago Chronic left-sided thoracic back pain   [facility] at [facility], Ocie Bob, MD

## 2023-08-26 ENCOUNTER — Ambulatory Visit (INDEPENDENT_AMBULATORY_CARE_PROVIDER_SITE_OTHER): Payer: 59 | Admitting: Vascular Surgery

## 2023-08-26 ENCOUNTER — Encounter (INDEPENDENT_AMBULATORY_CARE_PROVIDER_SITE_OTHER): Payer: 59

## 2023-08-29 ENCOUNTER — Other Ambulatory Visit: Payer: Self-pay | Admitting: Family Medicine

## 2023-08-29 DIAGNOSIS — I6930 Unspecified sequelae of cerebral infarction: Secondary | ICD-10-CM

## 2023-08-30 NOTE — Telephone Encounter (Signed)
 Requested Prescriptions  Pending Prescriptions Disp Refills   clopidogrel (PLAVIX) 75 MG tablet [Pharmacy Med Name: CLOPIDOGREL 75 MG TABLET] 30 tablet 0    Sig: Take 1 tablet (75 mg total) by mouth daily.     Hematology: Antiplatelets - clopidogrel Passed - 08/30/2023  2:24 PM      Passed - HCT in normal range and within 180 days    HCT  Date Value Ref Range Status  06/07/2023 40.0 35.0 - 45.0 % Final         Passed - HGB in normal range and within 180 days    Hemoglobin  Date Value Ref Range Status  06/07/2023 14.0 11.7 - 15.5 g/dL Final  14/78/2956 21.3 (H) 12.0 - 15.0 g/dL Final         Passed - PLT in normal range and within 180 days    Platelets  Date Value Ref Range Status  06/07/2023 305 140 - 400 Thousand/uL Final   Platelet Count  Date Value Ref Range Status  11/22/2022 208 150 - 400 K/uL Final         Passed - Cr in normal range and within 360 days    Creat  Date Value Ref Range Status  12/22/2022 0.92 0.50 - 0.99 mg/dL Final   Creatinine, Ser  Date Value Ref Range Status  07/28/2023 0.75 0.44 - 1.00 mg/dL Final         Passed - Valid encounter within last 6 months    Recent Outpatient Visits           1 month ago Chronic left-sided thoracic back pain   [place] [facility] Smitty Cords, DO   2 months ago Oral thrush   [place] [facility] [place], Netta Neat, DO   6 months ago Essential hypertension   [place] [facility] Smitty Cords, DO   8 months ago Essential hypertension   [place] [facility] Smitty Cords, DO   8 months ago Chronic left-sided thoracic back pain   [facility] Care [facility] Medicine at [facility], Ocie Bob, MD

## 2023-08-31 ENCOUNTER — Other Ambulatory Visit (INDEPENDENT_AMBULATORY_CARE_PROVIDER_SITE_OTHER): Payer: Self-pay | Admitting: Vascular Surgery

## 2023-08-31 DIAGNOSIS — I6523 Occlusion and stenosis of bilateral carotid arteries: Secondary | ICD-10-CM

## 2023-08-31 DIAGNOSIS — I771 Stricture of artery: Secondary | ICD-10-CM

## 2023-09-02 ENCOUNTER — Ambulatory Visit (INDEPENDENT_AMBULATORY_CARE_PROVIDER_SITE_OTHER): Payer: 59

## 2023-09-02 DIAGNOSIS — I771 Stricture of artery: Secondary | ICD-10-CM

## 2023-09-02 DIAGNOSIS — I6523 Occlusion and stenosis of bilateral carotid arteries: Secondary | ICD-10-CM

## 2023-09-06 ENCOUNTER — Ambulatory Visit (INDEPENDENT_AMBULATORY_CARE_PROVIDER_SITE_OTHER): Payer: 59 | Admitting: Vascular Surgery

## 2023-09-06 ENCOUNTER — Encounter (INDEPENDENT_AMBULATORY_CARE_PROVIDER_SITE_OTHER): Payer: Self-pay | Admitting: Vascular Surgery

## 2023-09-06 VITALS — BP 134/82 | HR 97 | Resp 18 | Ht 62.0 in | Wt 119.8 lb

## 2023-09-06 DIAGNOSIS — I701 Atherosclerosis of renal artery: Secondary | ICD-10-CM | POA: Diagnosis not present

## 2023-09-06 DIAGNOSIS — I160 Hypertensive urgency: Secondary | ICD-10-CM

## 2023-09-06 DIAGNOSIS — I6523 Occlusion and stenosis of bilateral carotid arteries: Secondary | ICD-10-CM | POA: Diagnosis not present

## 2023-09-06 DIAGNOSIS — I771 Stricture of artery: Secondary | ICD-10-CM

## 2023-09-06 DIAGNOSIS — F17200 Nicotine dependence, unspecified, uncomplicated: Secondary | ICD-10-CM

## 2023-09-06 NOTE — Progress Notes (Addendum)
 MRN : [id number]  Joanna Garcia is a 48 y.o. (1975/12/16) female who presents with chief complaint of  Chief Complaint  Patient presents with   Follow-up    fu 4 weeks + Carotid Duplex  .  History of Present Illness: Patient returns today in follow up of severe carotid and subclavian artery disease.  She has extensive cerebrovascular disease with occlusion of both carotid arteries.  She also underwent angioplasty of a heavily diseased right subclavian artery earlier this year.  Stent was not placed in large part due to the proximity of the vertebral artery on the right side.  Initially after the procedure, the patient developed almost complete resolution of her dizziness and vertebrobasilar symptoms.  Her brain fog improved.  She says her symptoms have come back somewhat but not as severe as they were initially.  She denies any true focal neurologic symptoms since her last visit such as arm or leg weakness or numbness, speech or swallowing difficulty, or temporary monocular blindness.  Her noninvasive studies today demonstrate occlusion of both carotid arteries which was expected.  Her velocities in her right subclavian artery would indicate recurrent stenosis.  Her left subclavian artery is widely patent.  Her brachial artery blood pressures are about 30 points lower on the right than the left.  She continues to smoke although she is trying to quit and has cut back.  Current Outpatient Medications  Medication Sig Dispense Refill   aspirin  81 MG chewable tablet Chew and swallow 1 tablet (81 mg total) by mouth daily. 30 tablet 2   cyanocobalamin  (VITAMIN B12) 1000 MCG/ML injection Inject 1 mL (1,000 mcg total) into the muscle every 30 (thirty) days. For 4 months 4 mL 0   latanoprost (XALATAN) 0.005 % ophthalmic solution SMARTSIG:In Eye(s)     losartan  (COZAAR ) 25 MG tablet Take 1 tablet (25 mg total) by mouth daily. 90 tablet 3   tiZANidine  (ZANAFLEX ) 4 MG tablet TAKE ONE TABLET EVERY 8 HOURS  AS NEEDED FOR MUSCLE SPASM. 90 tablet 2   amLODipine  (NORVASC ) 5 MG tablet Take 1 tablet (5 mg total) by mouth daily. 90 tablet 0   atorvastatin  (LIPITOR ) 80 MG tablet Take 1 tablet (80 mg total) by mouth daily. 90 tablet 3   clopidogrel  (PLAVIX ) 75 MG tablet Take 1 tablet (75 mg total) by mouth daily. 90 tablet 3   furosemide  (LASIX ) 20 MG tablet Take 1 tablet (20 mg total) by mouth daily as needed. 30 tablet 0   gabapentin  (NEURONTIN ) 300 MG capsule Take 3 caps = 900mg  dose in morning and afternoon and take 4 caps = 1200mg  in evening. Max dose is 10 capsules in 24 hours. 900 capsule 1   ondansetron  (ZOFRAN -ODT) 4 MG disintegrating tablet TAKE 1 TABLET EVERY 8 HOURS AS NEEDED FOR NAUSEA AND VOMITING. 30 tablet 0   oxyCODONE -acetaminophen  (PERCOCET/ROXICET) 5-325 MG tablet Take 1 tablet by mouth every 4 (four) hours as needed for severe pain (pain score 7-10). Max 4 per day 120 tablet 0   pantoprazole  (PROTONIX ) 40 MG tablet Take 1 tablet (40 mg total) by mouth daily before breakfast. STOP OMEPRAZOLE  AND START PANTOPRAZOLE  PER MD 90 tablet 0   potassium chloride  SA (KLOR-CON  M) 20 MEQ tablet Take 1 tablet (20 mEq total) by mouth daily for 3 days. 3 tablet 0   REPATHA  SURECLICK 140 MG/ML SOAJ Inject 140 mg into the skin every 14 (fourteen) days. (Patient not taking: Reported on 11/02/2023) 2 mL 2   No  current facility-administered medications for this visit.    Past Medical History:  Diagnosis Date   DVT (deep venous thrombosis) (HCC)    GERD (gastroesophageal reflux disease)    Hypertension    Polycythemia    Polyneuropathy    Stroke [facility])     Past Surgical History:  Procedure Laterality Date   CAROTID ANGIOGRAPHY Bilateral 07/28/2023   Procedure: CAROTID ANGIOGRAPHY;  Surgeon: Celso College, MD;  Location: [facility] INVASIVE CV LAB;  Service: Cardiovascular;  Laterality: Bilateral;   COLONOSCOPY N/A 03/16/2019   Procedure: COLONOSCOPY;  Surgeon: Toledo, Alphonsus Jeans, MD;  Location: [facility]  ENDOSCOPY;  Service: Gastroenterology;  Laterality: N/A;   ESOPHAGOGASTRODUODENOSCOPY N/A 03/16/2019   Procedure: ESOPHAGOGASTRODUODENOSCOPY (EGD);  Surgeon: Toledo, Alphonsus Jeans, MD;  Location: [facility] ENDOSCOPY;  Service: Gastroenterology;  Laterality: N/A;   INDOCYANINE GREEN  FLUORESCENCE IMAGING (ICG)  10/13/2023   Procedure: INDOCYANINE GREEN  FLUORESCENCE IMAGING (ICG);  Surgeon: Eldred Grego, MD;  Location: [facility] ORS;  Service: General;;   IR ANGIO INTRA EXTRACRAN SEL COM CAROTID INNOMINATE UNI L MOD SED  02/09/2021   IR INTRAVSC STENT CERV CAROTID W/EMB-PROT MOD SED INCL ANGIO  02/09/2021   IR PERCUTANEOUS ART THROMBECTOMY/INFUSION INTRACRANIAL INC DIAG ANGIO  02/09/2021   IR US  GUIDE VASC ACCESS RIGHT  02/09/2021   NO PAST SURGERIES     RADIOLOGY WITH ANESTHESIA N/A 02/09/2021   Procedure: IR WITH ANESTHESIA;  Surgeon: Radiologist, Medication, MD;  Location: [facility] OR;  Service: Radiology;  Laterality: N/A;   RENAL ANGIOGRAPHY Left 01/24/2023   Procedure: RENAL ANGIOGRAPHY;  Surgeon: Celso College, MD;  Location: [facility] INVASIVE CV LAB;  Service: Cardiovascular;  Laterality: Left;     Social History   Tobacco Use   Smoking status: Every Day    Current packs/day: 0.75    Average packs/day: 0.8 packs/day for 31.4 years (23.6 ttl pk-yrs)    Types: Cigarettes    Start date: 06/21/1992   Smokeless tobacco: Never  Vaping Use   Vaping status: Never Used  Substance Use Topics   Alcohol use: Yes    Comment: 24 Oz daily   Drug use: Not Currently    Types: Marijuana       Family History  Problem Relation Age of Onset   Hypertension Mother    Hyperlipidemia Mother    Heart disease Mother    COPD Mother    Hemochromatosis Mother    Hypertension Father    Hyperlipidemia Father    Cancer Father        prostate   Hemochromatosis Maternal Aunt    Hemochromatosis Maternal Aunt    Hemochromatosis Maternal Uncle    Leukemia Paternal Uncle    Cancer Paternal Grandmother    Breast  cancer Paternal Grandmother      No Known Allergies  REVIEW OF SYSTEMS (Negative unless checked)   Constitutional: [] Weight loss  [] Fever  [] Chills Cardiac: [] Chest pain   [] Chest pressure   [] Palpitations   [] Shortness of breath when laying flat   [] Shortness of breath at rest   [] Shortness of breath with exertion. Vascular:  [] Pain in legs with walking   [] Pain in legs at rest   [] Pain in legs when laying flat   [] Claudication   [] Pain in feet when walking  [] Pain in feet at rest  [] Pain in feet when laying flat   [x] History of DVT   [] Phlebitis   [] Swelling in legs   [] Varicose veins   [] Non-healing ulcers Pulmonary:   [] Uses home oxygen   [] Productive  cough   [] Hemoptysis   [] Wheeze  [] COPD   [] Asthma Neurologic:  [] Dizziness  [] Blackouts   [] Seizures   [x] History of stroke   [] History of TIA  [] Aphasia   [x] Temporary blindness   [] Dysphagia   [] Weakness or numbness in arms   [] Weakness or numbness in legs Musculoskeletal:  [] Arthritis   [] Joint swelling   [] Joint pain   [] Low back pain Hematologic:  [] Easy bruising  [] Easy bleeding   [] Hypercoagulable state   [] Anemic   Gastrointestinal:  [] Blood in stool   [] Vomiting blood  [x] Gastroesophageal reflux/heartburn   [] Abdominal pain Genitourinary:  [] Chronic kidney disease   [] Difficult urination  [] Frequent urination  [] Burning with urination   [] Hematuria Skin:  [] Rashes   [] Ulcers   [] Wounds Psychological:  [] History of anxiety   []  History of major depression.   Physical Examination  BP 134/82   Pulse 97   Resp 18   Ht 5' 2 (1.575 m)   Wt 119 lb 12.8 oz (54.3 kg)   BMI 21.91 kg/m  Gen:  WD/WN, NAD. Appears older than stated age. Head: [place]/AT, No temporalis wasting. Ear/Nose/Throat: Hearing grossly intact, nares w/o erythema or drainage Eyes: Conjunctiva clear. Sclera non-icteric Neck: Supple.  Trachea midline Pulmonary:  Good air movement, no use of accessory muscles.  Cardiac: RRR, no JVD Vascular:  Vessel Right Left   Radial Palpable Palpable               Musculoskeletal: M/S 5/5 throughout.  No deformity or atrophy. No edema. Neurologic: Sensation grossly intact in extremities.  Symmetrical.  Speech is fluent.  Psychiatric: Judgment intact, Mood & affect appropriate for pt's clinical situation. Dermatologic: No rashes or ulcers noted.  No cellulitis or open wounds.      Labs Recent Results (from the past 2160 hours)  COMPLETE METABOLIC PANEL WITHOUT GFR     Status: Abnormal   Collection Time: 09/23/23  7:59 AM  Result Value Ref Range   Glucose, Bld 73 65 - 139 mg/dL    Comment: .        Non-fasting reference interval .    BUN 7 7 - 25 mg/dL   Creat 8.11 9.14 - 7.82 mg/dL   BUN/Creatinine Ratio SEE NOTE: 6 - 22 (calc)    Comment:    Not Reported: BUN and Creatinine are within    reference range. .    Sodium 137 135 - 146 mmol/L   Potassium 3.7 3.5 - 5.3 mmol/L   Chloride 95 (L) 98 - 110 mmol/L   CO2 31 20 - 32 mmol/L   Calcium  8.8 8.6 - 10.2 mg/dL   Total Protein 5.9 (L) 6.1 - 8.1 g/dL   Albumin 3.9 3.6 - 5.1 g/dL   Globulin 2.0 1.9 - 3.7 g/dL (calc)   AG Ratio 2.0 1.0 - 2.5 (calc)   Total Bilirubin 0.7 0.2 - 1.2 mg/dL   Alkaline phosphatase (APISO) 117 31 - 125 U/L   AST 32 10 - 35 U/L   ALT 41 (H) 6 - 29 U/L  Lipid panel     Status: None   Collection Time: 09/23/23  7:59 AM  Result Value Ref Range   Cholesterol 169 <200 mg/dL   HDL 97 > OR = 50 mg/dL   Triglycerides 94 <956 mg/dL   LDL Cholesterol (Calc) 54 mg/dL (calc)    Comment: Reference range: <100 . Desirable range <100 mg/dL for primary prevention;   <70 mg/dL for patients with CHD or diabetic patients  with >  or = 2 CHD risk factors. Aaron Aas LDL-C is now calculated using the Martin-Hopkins  calculation, which is a validated novel method providing  better accuracy than the Friedewald equation in the  estimation of LDL-C.  Melinda Sprawls et al. Erroll Heard. 1610;960(45): 2061-2068   (http://education.[employer].com/faq/FAQ164)    Total CHOL/HDL Ratio 1.7 <5.0 (calc)   Non-HDL Cholesterol (Calc) 72 <409 mg/dL (calc)    Comment: For patients with diabetes plus 1 major ASCVD risk  factor, treating to a non-HDL-C goal of <100 mg/dL  (LDL-C of <81 mg/dL) is considered a therapeutic  option.   Protime-INR     Status: None   Collection Time: 10/10/23  2:46 PM  Result Value Ref Range   Prothrombin Time 14.5 11.4 - 15.2 seconds   INR 1.1 0.8 - 1.2    Comment: (NOTE) INR goal varies based on device and disease states. Performed at [facility], [address] [place], [place] [address]   APTT     Status: None   Collection Time: 10/10/23  2:46 PM  Result Value Ref Range   aPTT 28 24 - 36 seconds    Comment: Performed at [facility], [address] [place], [place] [address]  CBC     Status: Abnormal   Collection Time: 10/10/23  2:46 PM  Result Value Ref Range   WBC 5.5 4.0 - 10.5 K/uL   RBC 3.82 (L) 3.87 - 5.11 MIL/uL   Hemoglobin 14.3 12.0 - 15.0 g/dL   HCT 21.3 08.6 - 57.8 %   MCV 109.2 (H) 80.0 - 100.0 fL   MCH 37.4 (H) 26.0 - 34.0 pg   MCHC 34.3 30.0 - 36.0 g/dL   RDW 46.9 62.9 - 52.8 %   Platelets 236 150 - 400 K/uL   nRBC 0.0 0.0 - 0.2 %    Comment: Performed at [facility], [address] [place], [place] [address]  Differential     Status: None   Collection Time: 10/10/23  2:46 PM  Result Value Ref Range   Neutrophils Relative % 63 %   Neutro Abs 3.4 1.7 - 7.7 K/uL   Lymphocytes Relative 26 %   Lymphs Abs 1.4 0.7 - 4.0 K/uL   Monocytes Relative 9 %   Monocytes Absolute 0.5 0.1 - 1.0 K/uL   Eosinophils Relative 1 %   Eosinophils Absolute 0.1 0.0 - 0.5 K/uL   Basophils Relative 1 %   Basophils Absolute 0.1 0.0 - 0.1 K/uL   Immature Granulocytes 0 %   Abs Immature Granulocytes 0.01 0.00 - 0.07 K/uL    Comment: Performed at [facility], [address] [place], [place] [address]  Comprehensive  metabolic panel     Status: Abnormal   Collection Time: 10/10/23  2:46 PM  Result Value Ref Range   Sodium 140 135 - 145 mmol/L   Potassium 2.7 (LL) 3.5 - 5.1 mmol/L    Comment: CRITICAL RESULT CALLED TO, READ BACK BY AND VERIFIED WITH JANE RYAN 10/10/23 @ 1536 BY SH    Chloride 102 98 - 111 mmol/L   CO2 26 22 - 32 mmol/L   Glucose, Bld 73 70 - 99 mg/dL    Comment: Glucose reference range applies only to samples taken after fasting for at least 8 hours.   BUN 7 6 - 20 mg/dL   Creatinine, Ser 7.25 0.44 - 1.00 mg/dL   Calcium  8.0 (L) 8.9 - 10.3 mg/dL   Total Protein 5.1 (L) 6.5 - 8.1  g/dL   Albumin 3.0 (L) 3.5 - 5.0 g/dL   AST 161 (H) 15 - 41 U/L   ALT 89 (H) 0 - 44 U/L   Alkaline Phosphatase 158 (H) 38 - 126 U/L   Total Bilirubin 1.0 0.0 - 1.2 mg/dL   GFR, Estimated >09 >60 mL/min    Comment: (NOTE) Calculated using the CKD-EPI Creatinine Equation (2021)    Anion gap 12 5 - 15    Comment: Performed at [facility], [address] [place], [place] [address]  Sedimentation rate     Status: None   Collection Time: 10/10/23  2:46 PM  Result Value Ref Range   Sed Rate 1 0 - 20 mm/hr    Comment: Performed at [facility], [address] [place], [place] [address]  Resp panel by RT-PCR (RSV, Flu A&B, Covid) Anterior Nasal Swab     Status: None   Collection Time: 10/10/23  9:37 PM   Specimen: Anterior Nasal Swab  Result Value Ref Range   SARS Coronavirus 2 by RT PCR NEGATIVE NEGATIVE    Comment: (NOTE) SARS-CoV-2 target nucleic acids are NOT DETECTED.  The SARS-CoV-2 RNA is generally detectable in upper respiratory specimens during the acute phase of infection. The lowest concentration of SARS-CoV-2 viral copies this assay can detect is 138 copies/mL. A negative result does not preclude SARS-Cov-2 infection and should not be used as the sole basis for treatment or other patient management decisions. A negative result may occur with  improper specimen  collection/handling, submission of specimen other than nasopharyngeal swab, presence of viral mutation(s) within the areas targeted by this assay, and inadequate number of viral copies(<138 copies/mL). A negative result must be combined with clinical observations, patient history, and epidemiological information. The expected result is Negative.  Fact Sheet for Patients:  BloggerCourse.com  Fact Sheet for Healthcare Providers:  SeriousBroker.it  This test is no t yet approved or cleared by the [place]  FDA and  has been authorized for detection and/or diagnosis of SARS-CoV-2 by FDA under an Emergency Use Authorization (EUA). This EUA will remain  in effect (meaning this test can be used) for the duration of the COVID-19 declaration under Section 564(b)(1) of the Act, 21 U.S.C.section 360bbb-3(b)(1), unless the authorization is terminated  or revoked sooner.       Influenza A by PCR NEGATIVE NEGATIVE   Influenza B by PCR NEGATIVE NEGATIVE    Comment: (NOTE) The Xpert Xpress SARS-CoV-2/FLU/RSV plus assay is intended as an aid in the diagnosis of influenza from Nasopharyngeal swab specimens and should not be used as a sole basis for treatment. Nasal washings and aspirates are unacceptable for Xpert Xpress SARS-CoV-2/FLU/RSV testing.  Fact Sheet for Patients: BloggerCourse.com  Fact Sheet for Healthcare Providers: SeriousBroker.it  This test is not yet approved or cleared by the [place]  FDA and has been authorized for detection and/or diagnosis of SARS-CoV-2 by FDA under an Emergency Use Authorization (EUA). This EUA will remain in effect (meaning this test can be used) for the duration of the COVID-19 declaration under Section 564(b)(1) of the Act, 21 U.S.C. section 360bbb-3(b)(1), unless the authorization is terminated or revoked.     Resp Syncytial Virus by PCR  NEGATIVE NEGATIVE    Comment: (NOTE) Fact Sheet for Patients: BloggerCourse.com  Fact Sheet for Healthcare Providers: SeriousBroker.it  This test is not yet approved or cleared by the [place]  FDA and has been authorized for detection and/or diagnosis of SARS-CoV-2 by FDA under an Emergency  Use Authorization (EUA). This EUA will remain in effect (meaning this test can be used) for the duration of the COVID-19 declaration under Section 564(b)(1) of the Act, 21 U.S.C. section 360bbb-3(b)(1), unless the authorization is terminated or revoked.  Performed at [facility], [address] [place], [place] [address]   Ethanol     Status: Abnormal   Collection Time: 10/10/23 10:51 PM  Result Value Ref Range   Alcohol, Ethyl (B) 35 (H) <10 mg/dL    Comment: (NOTE) For medical purposes only. Performed at [facility], [address] [place], [place] [address]   C-reactive protein     Status: None   Collection Time: 10/10/23 10:51 PM  Result Value Ref Range   CRP <0.5 <1.0 mg/dL    Comment: Performed at [facility], [address], [place], [place] [address]  Lipase, blood     Status: None   Collection Time: 10/12/23 11:19 PM  Result Value Ref Range   Lipase 20 11 - 51 U/L    Comment: Performed at [facility], [address] [place], [place] [address]  Comprehensive metabolic panel     Status: Abnormal   Collection Time: 10/12/23 11:19 PM  Result Value Ref Range   Sodium 141 135 - 145 mmol/L   Potassium 3.3 (L) 3.5 - 5.1 mmol/L   Chloride 102 98 - 111 mmol/L   CO2 25 22 - 32 mmol/L   Glucose, Bld 109 (H) 70 - 99 mg/dL    Comment: Glucose reference range applies only to samples taken after fasting for at least 8 hours.   BUN <5 (L) 6 - 20 mg/dL   Creatinine, Ser 6.57 0.44 - 1.00 mg/dL   Calcium  8.2 (L) 8.9 - 10.3 mg/dL   Total Protein 6.1 (L) 6.5 - 8.1 g/dL   Albumin 3.4 (L) 3.5 - 5.0  g/dL   AST 846 (H) 15 - 41 U/L   ALT 89 (H) 0 - 44 U/L   Alkaline Phosphatase 196 (H) 38 - 126 U/L   Total Bilirubin 1.2 0.0 - 1.2 mg/dL   GFR, Estimated >96 >29 mL/min    Comment: (NOTE) Calculated using the CKD-EPI Creatinine Equation (2021)    Anion gap 14 5 - 15    Comment: Performed at [facility], [address] [place], [place] [address]  CBC     Status: Abnormal   Collection Time: 10/12/23 11:19 PM  Result Value Ref Range   WBC 9.7 4.0 - 10.5 K/uL   RBC 4.08 3.87 - 5.11 MIL/uL   Hemoglobin 15.6 (H) 12.0 - 15.0 g/dL   HCT 32.4 40.1 - 02.7 %   MCV 108.6 (H) 80.0 - 100.0 fL   MCH 38.2 (H) 26.0 - 34.0 pg   MCHC 35.2 30.0 - 36.0 g/dL   RDW 25.3 66.4 - 40.3 %   Platelets 220 150 - 400 K/uL   nRBC 0.0 0.0 - 0.2 %    Comment: Performed at [facility], [address], [place], [place] [address]  Surgical pathology     Status: None   Collection Time: 10/13/23 12:00 AM  Result Value Ref Range   SURGICAL PATHOLOGY      SURGICAL PATHOLOGY [facility] [address], [address] [place], [place] [address] Telephone [phone] or [phone] Fax [phone]  REPORT OF SURGICAL PATHOLOGY   Accession #: [id number] Patient Name: DYAMON, SOSINSKI Visit # : [id number]  MRN: [id number] Physician: Dortha Gauss  Elmo Haff DOB/Age [birth year] (Age: 47) Gender: F Collected Date: 10/13/2023 Received Date: 10/13/2023  FINAL DIAGNOSIS       1. Gallbladder,  :       - CHRONIC CHOLECYSTITIS WITH CHOLELITHIASIS.      - NEGATIVE FOR MALIGNANCY.       DATE SIGNED OUT: 10/14/2023 ELECTRONIC SIGNATURE : Brunetta Capes Md, Alexandria Ida , Pathologist, Electronic Signature  MICROSCOPIC DESCRIPTION  CASE COMMENTS STAINS USED IN DIAGNOSIS: H&E    CLINICAL HISTORY  SPECIMEN(S) OBTAINED 1. Gallbladder,  SPECIMEN COMMENTS: SPECIMEN CLINICAL INFORMATION: 1. Acute cholecystitis    Gross Description 1. Size/?Intact: 7.5 x 3.4 cm; intact      Serosal  surface: Pink-tan, smooth, and  glistening with a mild amount of adipose      tissue and a mildly roughened hepatic bed.      Mucosa/Wall: Tan-red, velvety mucosa; 0.2-0.4 cm thick wall. Discrete lesions      are not identified.      Contents: Moderate amount of tenacious, green-yellow bile admixed with a 1.3 x      0.5 x 0.3 cm aggregate of black stone fragments (less than 0.1 cm in greatest      dimension).      Cystic duct: 0.1 cm diameter, patent; received with a single white, plastic      clamp; inked black.      Block Summary: Representative sections including the cystic duct margin (en      face) are submitted in 1 block (1A).      Collection time: 10/13/2023 at 1310      Time in formalin: 10/13/2023 at 1502      Cold ischemia time: 112 minutes      AMG 10/13/2023        Report signed out from the following location(s) [place] [address] [place], [place] [address] CLIA #: [id number]  [facility] [address] [place], [place] [address] CLIA  #: [id number]   Urinalysis, Routine w reflex microscopic -Urine, Clean Catch     Status: Abnormal   Collection Time: 10/13/23  2:51 AM  Result Value Ref Range   Color, Urine YELLOW (A) YELLOW   APPearance CLEAR (A) CLEAR   Specific Gravity, Urine 1.011 1.005 - 1.030   pH 7.0 5.0 - 8.0   Glucose, UA NEGATIVE NEGATIVE mg/dL   Hgb urine dipstick NEGATIVE NEGATIVE   Bilirubin Urine NEGATIVE NEGATIVE   Ketones, ur NEGATIVE NEGATIVE mg/dL   Protein, ur NEGATIVE NEGATIVE mg/dL   Nitrite NEGATIVE NEGATIVE   Leukocytes,Ua NEGATIVE NEGATIVE    Comment: Performed at [facility], [address] [place], [place] [address]  CBC with Differential/Platelet     Status: Abnormal   Collection Time: 10/14/23  4:49 AM  Result Value Ref Range   WBC 4.6 4.0 - 10.5 K/uL   RBC 3.98 3.87 - 5.11 MIL/uL   Hemoglobin 14.8 12.0 - 15.0 g/dL   HCT 82.4 23.5 - 36.1 %   MCV 106.3 (H) 80.0 - 100.0 fL    MCH 37.2 (H) 26.0 - 34.0 pg   MCHC 35.0 30.0 - 36.0 g/dL   RDW 44.3 15.4 - 00.8 %   Platelets 170 150 - 400 K/uL   nRBC 0.0 0.0 - 0.2 %   Neutrophils Relative % 80 %   Neutro Abs 3.6 1.7 - 7.7 K/uL   Lymphocytes Relative 14 %   Lymphs Abs 0.7 0.7 - 4.0 K/uL   Monocytes Relative 6 %  Monocytes Absolute 0.3 0.1 - 1.0 K/uL   Eosinophils Relative 0 %   Eosinophils Absolute 0.0 0.0 - 0.5 K/uL   Basophils Relative 0 %   Basophils Absolute 0.0 0.0 - 0.1 K/uL   Immature Granulocytes 0 %   Abs Immature Granulocytes 0.01 0.00 - 0.07 K/uL    Comment: Performed at [facility], [address] [place], [place] [address]  Comprehensive metabolic panel     Status: Abnormal   Collection Time: 10/14/23  4:49 AM  Result Value Ref Range   Sodium 139 135 - 145 mmol/L   Potassium 3.5 3.5 - 5.1 mmol/L   Chloride 104 98 - 111 mmol/L   CO2 25 22 - 32 mmol/L   Glucose, Bld 108 (H) 70 - 99 mg/dL    Comment: Glucose reference range applies only to samples taken after fasting for at least 8 hours.   BUN <5 (L) 6 - 20 mg/dL   Creatinine, Ser 9.56 0.44 - 1.00 mg/dL   Calcium  8.0 (L) 8.9 - 10.3 mg/dL   Total Protein 5.3 (L) 6.5 - 8.1 g/dL   Albumin 3.1 (L) 3.5 - 5.0 g/dL   AST 54 (H) 15 - 41 U/L   ALT 50 (H) 0 - 44 U/L   Alkaline Phosphatase 165 (H) 38 - 126 U/L   Total Bilirubin 1.0 0.0 - 1.2 mg/dL   GFR, Estimated >21 >30 mL/min    Comment: (NOTE) Calculated using the CKD-EPI Creatinine Equation (2021)    Anion gap 10 5 - 15    Comment: Performed at [facility], [address] [place], [place] [address]  Comprehensive metabolic panel with GFR     Status: Abnormal   Collection Time: 11/02/23  8:57 AM  Result Value Ref Range   Glucose, Bld 76 65 - 99 mg/dL    Comment: .            Fasting reference interval .    BUN 8 7 - 25 mg/dL   Creat 4.69 6.29 - 5.28 mg/dL   eGFR 413 > OR = 60 KG/MWN/0.27O5   BUN/Creatinine Ratio SEE NOTE: 6 - 22 (calc)    Comment:    Not  Reported: BUN and Creatinine are within    reference range. .    Sodium 142 135 - 146 mmol/L   Potassium 4.7 3.5 - 5.3 mmol/L   Chloride 103 98 - 110 mmol/L   CO2 31 20 - 32 mmol/L   Calcium  9.6 8.6 - 10.2 mg/dL   Total Protein 6.0 (L) 6.1 - 8.1 g/dL   Albumin 4.0 3.6 - 5.1 g/dL   Globulin 2.0 1.9 - 3.7 g/dL (calc)   AG Ratio 2.0 1.0 - 2.5 (calc)   Total Bilirubin 0.5 0.2 - 1.2 mg/dL   Alkaline phosphatase (APISO) 221 (H) 31 - 125 U/L   AST 56 (H) 10 - 35 U/L   ALT 59 (H) 6 - 29 U/L    Radiology No results found.  Assessment/Plan  Carotid occlusion, bilateral No role for intervention at this time.  Carotid occlusions are generally treated medically.  She does have reconstitution of her internal carotid artery on the right side consideration for subclavian to carotid bypass could be given, however due to the heavily diseased nature of her right subclavian artery that would be a poor choice at this time with risk of making the situation worse.  Hypertensive urgency Still with high blood pressure but better after renal artery intervention last year  Subclavian artery stenosis, right (HCC) Her noninvasive studies today demonstrate occlusion of both carotid arteries which was expected.  Her velocities in her right subclavian artery would indicate recurrent stenosis.  Her left subclavian artery is widely patent.  Her brachial artery blood pressures are about 30 points lower on the right than the left. This is a difficult situation.  It has been less than 3 months since her intervention.  She continues to smoke, she is going to markedly impaired the durability of any intervention.  We can consider a stent placement in this area and I think we will have to at some point, but given the proximity of her vertebral artery this would have to be a noncovered stent and even then I have some concerns.  For now with improved symptoms I am going to just recommend a short interval follow-up of about 3  months.  Continue current medical regimen.  Renal artery stenosis (HCC) Status post renal artery stenting for severe hypertension.  To be checked again later this year.  Smoking We had a discussion for approximately 3 minutes regarding the absolute need for smoking cessation due to the deleterious nature of tobacco on the vascular system. We discussed the tobacco use would diminish patency of any intervention, and likely significantly worsen progressio of disease. We discussed multiple agents for quitting including replacement therapy or medications to reduce cravings such as Chantix. The patient voices their understanding of the importance of smoking cessation.    Mikki Alexander, MD  12/02/2023 11:10 AM    This note was created with Dragon medical transcription system.  Any errors from dictation are purely unintentional

## 2023-09-09 ENCOUNTER — Encounter (INDEPENDENT_AMBULATORY_CARE_PROVIDER_SITE_OTHER): Payer: Self-pay | Admitting: Vascular Surgery

## 2023-09-09 NOTE — Assessment & Plan Note (Signed)
We had a discussion for approximately 3 minutes regarding the absolute need for smoking cessation due to the deleterious nature of tobacco on the vascular system. We discussed the tobacco use would diminish patency of any intervention, and likely significantly worsen progressio of disease. We discussed multiple agents for quitting including replacement therapy or medications to reduce cravings such as Chantix. The patient voices their understanding of the importance of smoking cessation.  

## 2023-09-09 NOTE — Assessment & Plan Note (Signed)
 Still with high blood pressure but better after renal artery intervention last year

## 2023-09-09 NOTE — Assessment & Plan Note (Signed)
 Her noninvasive studies today demonstrate occlusion of both carotid arteries which was expected.  Her velocities in her right subclavian artery would indicate recurrent stenosis.  Her left subclavian artery is widely patent.  Her brachial artery blood pressures are about 30 points lower on the right than the left. This is a difficult situation.  It has been less than 3 months since her intervention.  She continues to smoke, she is going to markedly impaired the durability of any intervention.  We can consider a stent placement in this area and I think we will have to at some point, but given the proximity of her vertebral artery this would have to be a noncovered stent and even then I have some concerns.  For now with improved symptoms I am going to just recommend a short interval follow-up of about 3 months.  Continue current medical regimen.

## 2023-09-09 NOTE — Assessment & Plan Note (Signed)
 Status post renal artery stenting for severe hypertension.  To be checked again later this year.

## 2023-09-09 NOTE — Assessment & Plan Note (Signed)
 No role for intervention at this time.  Carotid occlusions are generally treated medically.  She does have reconstitution of her internal carotid artery on the right side consideration for subclavian to carotid bypass could be given, however due to the heavily diseased nature of her right subclavian artery that would be a poor choice at this time with risk of making the situation worse.

## 2023-09-14 ENCOUNTER — Encounter: Payer: Self-pay | Admitting: Oncology

## 2023-09-15 ENCOUNTER — Encounter: Payer: Self-pay | Admitting: Family Medicine

## 2023-09-15 ENCOUNTER — Telehealth: Payer: Self-pay

## 2023-09-15 ENCOUNTER — Ambulatory Visit (INDEPENDENT_AMBULATORY_CARE_PROVIDER_SITE_OTHER): Admitting: Family Medicine

## 2023-09-15 VITALS — BP 132/68 | HR 84 | Ht 62.0 in | Wt 121.0 lb

## 2023-09-15 DIAGNOSIS — M545 Low back pain, unspecified: Secondary | ICD-10-CM

## 2023-09-15 DIAGNOSIS — M546 Pain in thoracic spine: Secondary | ICD-10-CM | POA: Diagnosis not present

## 2023-09-15 DIAGNOSIS — I6523 Occlusion and stenosis of bilateral carotid arteries: Secondary | ICD-10-CM | POA: Diagnosis not present

## 2023-09-15 DIAGNOSIS — M5134 Other intervertebral disc degeneration, thoracic region: Secondary | ICD-10-CM

## 2023-09-15 DIAGNOSIS — I6930 Unspecified sequelae of cerebral infarction: Secondary | ICD-10-CM

## 2023-09-15 DIAGNOSIS — G8929 Other chronic pain: Secondary | ICD-10-CM

## 2023-09-15 DIAGNOSIS — I771 Stricture of artery: Secondary | ICD-10-CM | POA: Diagnosis not present

## 2023-09-15 MED ORDER — OXYCODONE-ACETAMINOPHEN 5-325 MG PO TABS: 1.0000 | ORAL_TABLET | ORAL | 0 refills | Status: DC | PRN

## 2023-09-15 MED ORDER — REPATHA SURECLICK 140 MG/ML ~~LOC~~ SOAJ: 140.0000 mg | SUBCUTANEOUS | 2 refills | Status: DC

## 2023-09-15 NOTE — Progress Notes (Signed)
 Subjective:    Patient ID: Joanna Garcia, female    DOB: Oct 30, 1975, 48 y.o.   MRN: [id number]  Joanna Garcia is a 48 y.o. female presenting on 09/15/2023 for history of cva (Occlusion infarct, history of subclavian artery stenosis and carotid artery occlusion)   HPI  Discussed the use of AI scribe software for clinical note transcription with the patient, who gave verbal consent to proceed.  History of Present Illness   Joanna Garcia "Marcelino Duster" is a 48 year old female with history of Stroke CVA with ocular symptoms and carotid and subclavian artery occlusions who presents with recurrent symptoms post-angioplasty.   Recent history followed by [facility] has been found to have retinal ischemia ocular symptoms, and then set up with Vascular specialist as well further work up identified history of stroke cva symptoms ocular and vestibular.  In February 07/28/23, she underwent an angioplasty procedure to address occlusions in her carotid and subclavian arteries. The procedure involved using a balloon to open the arteries without placing a stent due to the proximity of the vertebral artery. Initially, this improved her symptoms (improved vision and weakness) but some symptoms have returned, though not as frequently as before.  Approximately two and a half weeks post-procedure, she began experiencing a recurrence of symptoms, including issues with her vision and weakness in her right arm. Previously, she had significant vision problems in one eye, but now the symptoms are less frequent. She also notes the return of weakness in her right arm.  Chronic Thoracic Back Pain She experiences daily back pain, which was previously managed with hydrocodone 5/325 mg, taken up to four times a day. The medication initially helped but is no longer effective. She cannot undergo further procedures for her back pain due to the necessity of continuing blood thinners.  She is currently taking  atorvastatin 80 mg for cholesterol management. She came off Zetia due to myalgia. She has a history of smoking and is using nicotine patches to reduce her smoking habit.          09/15/2023    8:02 AM 07/06/2023    8:35 AM 06/07/2023    9:41 AM  Depression screen PHQ 2/9  Decreased Interest 0 0 0  Down, Depressed, Hopeless 0 0 0  PHQ - 2 Score 0 0 0  Altered sleeping 0  0  Tired, decreased energy 0  1  Change in appetite 0  0  Feeling bad or failure about yourself  0  0  Trouble concentrating 0  0  Moving slowly or fidgety/restless 0  0  Suicidal thoughts 0  0  PHQ-9 Score 0  1       09/15/2023    8:03 AM 07/06/2023    8:35 AM 06/07/2023    9:42 AM 02/08/2023    9:46 AM  GAD 7 : Generalized Anxiety Score  Nervous, Anxious, on Edge 0 0 0 0  Control/stop worrying 0 0 0 0  Worry too much - different things 0 0  0  Trouble relaxing 0 0 0 0  Restless 0 0 0 0  Easily annoyed or irritable 0 0 0 0  Afraid - awful might happen 0 0 0 0  Total GAD 7 Score 0 0  0  Anxiety Difficulty    Not difficult at all    Social History   Tobacco Use   Smoking status: Every Day    Current packs/day: 0.75    Average packs/day: 0.8  packs/day for 31.2 years (23.4 ttl pk-yrs)    Types: Cigarettes    Start date: 06/21/1992   Smokeless tobacco: Never  Vaping Use   Vaping status: Never Used  Substance Use Topics   Alcohol use: Yes    Comment: 24 Oz daily   Drug use: Not Currently    Types: Marijuana    Review of Systems Per HPI unless specifically indicated above     Objective:    BP 132/68 (BP Location: Left Arm, Patient Position: Sitting)   Pulse 84   Ht 5\' 2"  (1.575 m)   Wt 121 lb (54.9 kg)   BMI 22.13 kg/m   Wt Readings from Last 3 Encounters:  09/15/23 121 lb (54.9 kg)  09/06/23 119 lb 12.8 oz (54.3 kg)  07/28/23 120 lb (54.4 kg)    Physical Exam Vitals and nursing note reviewed.  Constitutional:      General: She is not in acute distress.    Appearance: Normal  appearance. She is well-developed. She is not diaphoretic.     Comments: Well-appearing, comfortable, cooperative  HENT:     Head: Normocephalic and atraumatic.  Eyes:     General:        Right eye: No discharge.        Left eye: No discharge.     Conjunctiva/sclera: Conjunctivae normal.  Cardiovascular:     Rate and Rhythm: Normal rate.  Pulmonary:     Effort: Pulmonary effort is normal.  Skin:    General: Skin is warm and dry.     Findings: No erythema or rash.  Neurological:     Mental Status: She is alert and oriented to person, place, and time.  Psychiatric:        Mood and Affect: Mood normal.        Behavior: Behavior normal.        Thought Content: Thought content normal.     Comments: Well groomed, good eye contact, normal speech and thoughts     Results for orders placed or performed during the hospital encounter of 07/28/23  BUN   Collection Time: 07/28/23 11:34 AM  Result Value Ref Range   BUN 7 6 - 20 mg/dL  Creatinine, serum   Collection Time: 07/28/23 11:34 AM  Result Value Ref Range   Creatinine, Ser 0.75 0.44 - 1.00 mg/dL   GFR, Estimated >84 >13 mL/min      Assessment & Plan:   Problem List Items Addressed This Visit     Chronic thoracic back pain (Left) (Chronic)   Relevant Medications   oxyCODONE-acetaminophen (PERCOCET/ROXICET) 5-325 MG tablet   DDD (degenerative disc disease), thoracic (Chronic)   Relevant Medications   oxyCODONE-acetaminophen (PERCOCET/ROXICET) 5-325 MG tablet   Carotid occlusion, bilateral   Relevant Medications   REPATHA SURECLICK 140 MG/ML SOAJ   Subclavian artery stenosis, right (HCC) - Primary   Relevant Medications   REPATHA SURECLICK 140 MG/ML SOAJ   Other Visit Diagnoses       History of cerebrovascular accident (CVA) with residual deficit       Relevant Medications   REPATHA SURECLICK 140 MG/ML SOAJ     Chronic left-sided low back pain without sciatica       Relevant Medications   oxyCODONE-acetaminophen  (PERCOCET/ROXICET) 5-325 MG tablet       History of CVA with residual Deficit (Occular deficits, vestibular) Subclavian artery stenosis with ischemic symptoms Carotid Artery Occlusion bilateral Followed by Dr Wyn [facility] Subclavian artery stenosis with  recurrent ischemic symptoms post-angioplasty. Potential need for stent.  On Atorvastatin 80mg  Failed Zetia, and unfortunately seems statin unsuccessful as well  Continue DAPT per Vascular - Aspirin ASA 81mg  daily + Plavix 75mg  daily  Vascular recommend Repatha considered for cholesterol management and arterial protection. - Start Repatha 140mg  every two weeks, pending insurance approval. - Keep on Atorvastatin 80mg  while on Repatha for now, we can consider adjusting statin in future per vascular recs - Consider Praluent if Repatha not covered. - Involve clinical pharmacist for insurance authorization if needed. - Follow-up with vascular specialist in three months.  Hyperlipidemia See above Hyperlipidemia managed with atorvastatin. Repatha considered for enhanced cholesterol control and arterial protection. - Continue atorvastatin 80 mg. - Start Repatha every two weeks, pending insurance approval. - Consider Praluent if Repatha not covered.  Chronic thoracic back pain Chronic back pain inadequately managed with hydrocodone. Transition to oxycodone considered for improved pain control. Unfortunately cannot proceed with procedures and injections due to cannot come off of blood thinners - Discontinue hydrocodone. - Start oxycodone for pain management. - Prescribe 120 pills of oxycodone for four times daily use, pending insurance approval. Future pain contract update if indicated next  Smoking cessation Smoking cessation critical for vascular health. - Continue using nicotine patches. - Offer additional support for smoking cessation if needed. Chantix consideration       ____________________________________________________ Additional Rx Information (May be used for Prior Authorization if required)  Medication name and Strength: Repatha 140mg   Primary Diagnosis and ICD10 Code: History of CVA with residual deficit (I69.30) Secondary Diagnosis and ICD10 Code: Subclavian Artery Stenosis (I77.1), Carotid Artery Occlusion bilateral (I69.30) Previous Failed Medications Atorvastatin Zetia Quantity and Duration of New Medication: 2 mL for 28 days Additional Supporting Information: NOTE - if this medicine is not approved or preferred insurance, we can consider switch to alternative version Praluent. ____________________________________________________   No orders of the defined types were placed in this encounter.   Meds ordered this encounter  Medications   REPATHA SURECLICK 140 MG/ML SOAJ    Sig: Inject 140 mg into the skin every 14 (fourteen) days.    Dispense:  2 mL    Refill:  2    Dx I77.1, I65.23, I69.30   oxyCODONE-acetaminophen (PERCOCET/ROXICET) 5-325 MG tablet    Sig: Take 1 tablet by mouth every 4 (four) hours as needed for severe pain (pain score 7-10). Max 4 per day    Dispense:  120 tablet    Refill:  0    Discontinue Hydroconde and Start Oxycodone    Follow up plan: Return in about 3 months (around 12/16/2023) for 3 month follow-up after Vascular / updates Pain/Cholesterol.  [name], DO [facility] 09/15/2023, 8:10 AM

## 2023-09-15 NOTE — Patient Instructions (Addendum)
 Thank you for coming to the office today.  Keep Atorvastatin 80mg  New start Repatha every 2 weeks. May need insurance approval / etc we can consider switching to Praluent or involving clinical pharmacy if needed  Switch from Hydrocodone to Oxycodone for now. 120 pills per month.  Please schedule a Follow-up Appointment to: Return in about 3 months (around 12/16/2023) for 3 month follow-up after Vascular / updates Pain/Cholesterol.  If you have any other questions or concerns, please feel free to call the office or send a message through MyChart. You may also schedule an earlier appointment if necessary.  Additionally, you may be receiving a survey about your experience at our office within a few days to 1 week by e-mail or mail. We value your feedback.  [name], DO [facility], [place]

## 2023-09-15 NOTE — Telephone Encounter (Addendum)
 Joanna Garcia (Key: [id number]) Rx #: [id number] Need Help? Call us at [phone] Status New (Not sent to plan) Drug Repatha SureClick 140MG /ML auto-injectors ePA cloud logo Form [employer] Electronic Prior Authorization Form [phone] [place]) Original Claim Info 608   Denied on March 29 by [employer] 2017 NCPDP Request Reference Number: [id number]. REPATHA SURE INJ 140MG /ML is denied for not meeting the prior authorization requirement(s). Details of this decision are in the notice attached below or have been faxed to you.

## 2023-09-20 ENCOUNTER — Telehealth: Payer: Self-pay

## 2023-09-20 DIAGNOSIS — I771 Stricture of artery: Secondary | ICD-10-CM

## 2023-09-20 DIAGNOSIS — I6930 Unspecified sequelae of cerebral infarction: Secondary | ICD-10-CM

## 2023-09-20 DIAGNOSIS — I6523 Occlusion and stenosis of bilateral carotid arteries: Secondary | ICD-10-CM

## 2023-09-20 DIAGNOSIS — E785 Hyperlipidemia, unspecified: Secondary | ICD-10-CM

## 2023-09-20 NOTE — Telephone Encounter (Signed)
 Copied from CRM [id number]. Topic: General - Other >> Sep 19, 2023  1:14 PM Turkey B wrote: Reason for CRM: Joanna Garcia from [place] , [place] dept, states, med Rapatha has been denied and they will be faxing a denial letter. Ref # is #[id number]

## 2023-09-20 NOTE — Telephone Encounter (Signed)
 Thank you for the update. Routing note to [place] for review. Joanna Garcia would you need a new VBCI referral to assist with this Prior Auth? Trying to figure out solution to get her on PCSK9i with very complex PAD vascular history and recent stroke.  [name], DO [facility] 09/20/2023, 9:00 AM

## 2023-09-21 NOTE — Telephone Encounter (Signed)
 Thank you for the update Gentry Fitz. Sure. I can place orders now for repeat fasting lipid, and she can be scheduled for lab sometime soon so we can get repeat LDL value in order to try again for her approval. Thank you for assisting with this!  [facility] Clinical Team - can you contact patient and relay the information about the Repatha denial and need for repeat lipid panel to hopefully get it covered now? Order is in, she will need lab apt.  [name], DO  [facility] 09/21/2023, 5:53 PM

## 2023-09-21 NOTE — Addendum Note (Signed)
 Addended by: Smitty Cords on: 09/21/2023 05:57 PM   Modules accepted: Orders

## 2023-09-22 NOTE — Telephone Encounter (Signed)
 Spoke with patient, scheduled her lab appt for tomorrow morning.

## 2023-09-23 ENCOUNTER — Other Ambulatory Visit

## 2023-09-23 DIAGNOSIS — I6930 Unspecified sequelae of cerebral infarction: Secondary | ICD-10-CM

## 2023-09-23 DIAGNOSIS — I771 Stricture of artery: Secondary | ICD-10-CM

## 2023-09-23 DIAGNOSIS — E785 Hyperlipidemia, unspecified: Secondary | ICD-10-CM

## 2023-09-23 DIAGNOSIS — I6523 Occlusion and stenosis of bilateral carotid arteries: Secondary | ICD-10-CM

## 2023-09-24 LAB — LIPID PANEL
Cholesterol: 169 mg/dL (ref <200)
HDL: 97 mg/dL (ref >=50)
LDL Cholesterol (Calc): 54 mg/dL
Non-HDL Cholesterol (Calc): 72 mg/dL (ref <130)
Total CHOL/HDL Ratio: 1.7 (calc) (ref <5.0)
Triglycerides: 94 mg/dL (ref <150)

## 2023-09-24 LAB — COMPLETE METABOLIC PANEL WITHOUT GFR
AG Ratio: 2 (calc) (ref 1.0–2.5)
ALT: 41 U/L — ABNORMAL HIGH (ref 6–29)
AST: 32 U/L (ref 10–35)
Albumin: 3.9 g/dL (ref 3.6–5.1)
Alkaline phosphatase (APISO): 117 U/L (ref 31–125)
BUN: 7 mg/dL (ref 7–25)
CO2: 31 mmol/L (ref 20–32)
Calcium: 8.8 mg/dL (ref 8.6–10.2)
Chloride: 95 mmol/L — ABNORMAL LOW (ref 98–110)
Creat: 0.8 mg/dL (ref 0.50–0.99)
Globulin: 2 g/dL (ref 1.9–3.7)
Glucose, Bld: 73 mg/dL (ref 65–139)
Potassium: 3.7 mmol/L (ref 3.5–5.3)
Sodium: 137 mmol/L (ref 135–146)
Total Bilirubin: 0.7 mg/dL (ref 0.2–1.2)
Total Protein: 5.9 g/dL — ABNORMAL LOW (ref 6.1–8.1)

## 2023-09-26 ENCOUNTER — Encounter: Payer: Self-pay | Admitting: Family Medicine

## 2023-09-26 NOTE — Telephone Encounter (Signed)
 Joanna Garcia, the results of repeat lipid panel are in. And it looks like LDL 54 misses the mark for the 55-99 LDL range. So I am concerned the Repatha may not be approved now. We may need to monitor her labs closely and repeat again in future to see if maintains control and if >55 to < 99 then reconsider Repatha again. What do you think?  [name], DO [facility] 09/26/2023, 9:42 AM

## 2023-09-29 ENCOUNTER — Other Ambulatory Visit: Payer: Self-pay | Admitting: Family Medicine

## 2023-09-29 DIAGNOSIS — I6930 Unspecified sequelae of cerebral infarction: Secondary | ICD-10-CM

## 2023-09-30 NOTE — Telephone Encounter (Signed)
 OV 09/15/23 Requested Prescriptions  Pending Prescriptions Disp Refills   clopidogrel (PLAVIX) 75 MG tablet [Pharmacy Med Name: CLOPIDOGREL 75 MG TABLET] 30 tablet 0    Sig: Take 1 tablet (75 mg total) by mouth daily.     Hematology: Antiplatelets - clopidogrel Failed - 09/30/2023 11:08 AM      Failed - Valid encounter within last 6 months    Recent Outpatient Visits           2 weeks ago Subclavian artery stenosis, right [facility])   [facility] [place], Netta Neat, DO              Passed - HCT in normal range and within 180 days    HCT  Date Value Ref Range Status  06/07/2023 40.0 35.0 - 45.0 % Final         Passed - HGB in normal range and within 180 days    Hemoglobin  Date Value Ref Range Status  06/07/2023 14.0 11.7 - 15.5 g/dL Final  16/03/9603 54.0 (H) 12.0 - 15.0 g/dL Final         Passed - PLT in normal range and within 180 days    Platelets  Date Value Ref Range Status  06/07/2023 305 140 - 400 Thousand/uL Final   Platelet Count  Date Value Ref Range Status  11/22/2022 208 150 - 400 K/uL Final         Passed - Cr in normal range and within 360 days    Creat  Date Value Ref Range Status  09/23/2023 0.80 0.50 - 0.99 mg/dL Final

## 2023-10-10 ENCOUNTER — Emergency Department

## 2023-10-10 ENCOUNTER — Other Ambulatory Visit: Payer: Self-pay

## 2023-10-10 ENCOUNTER — Emergency Department: Admission: EM | Admit: 2023-10-10 | Discharge: 2023-10-10 | Disposition: A | Discharge status: Home / Self Care

## 2023-10-10 DIAGNOSIS — E876 Hypokalemia: Secondary | ICD-10-CM | POA: Insufficient documentation

## 2023-10-10 DIAGNOSIS — I10 Essential (primary) hypertension: Secondary | ICD-10-CM | POA: Diagnosis not present

## 2023-10-10 DIAGNOSIS — Z8673 Personal history of transient ischemic attack (TIA), and cerebral infarction without residual deficits: Secondary | ICD-10-CM | POA: Diagnosis not present

## 2023-10-10 DIAGNOSIS — R42 Dizziness and giddiness: Secondary | ICD-10-CM | POA: Diagnosis present

## 2023-10-10 DIAGNOSIS — R519 Headache, unspecified: Secondary | ICD-10-CM | POA: Diagnosis not present

## 2023-10-10 LAB — APTT: aPTT: 28 s (ref 24–36)

## 2023-10-10 LAB — COMPREHENSIVE METABOLIC PANEL WITH GFR
ALT: 89 U/L — ABNORMAL HIGH (ref 0–44)
AST: 427 U/L — ABNORMAL HIGH (ref 15–41)
Albumin: 3 g/dL — ABNORMAL LOW (ref 3.5–5.0)
Alkaline Phosphatase: 158 U/L — ABNORMAL HIGH (ref 38–126)
Anion gap: 12 (ref 5–15)
BUN: 7 mg/dL (ref 6–20)
CO2: 26 mmol/L (ref 22–32)
Calcium: 8 mg/dL — ABNORMAL LOW (ref 8.9–10.3)
Chloride: 102 mmol/L (ref 98–111)
Creatinine, Ser: 0.62 mg/dL (ref 0.44–1.00)
GFR, Estimated: >60 mL/min (ref >60)
Glucose, Bld: 73 mg/dL (ref 70–99)
Potassium: 2.7 mmol/L — CL (ref 3.5–5.1)
Sodium: 140 mmol/L (ref 135–145)
Total Bilirubin: 1 mg/dL (ref 0.0–1.2)
Total Protein: 5.1 g/dL — ABNORMAL LOW (ref 6.5–8.1)

## 2023-10-10 LAB — DIFFERENTIAL
Abs Immature Granulocytes: 0.01 10*3/uL (ref 0.00–0.07)
Basophils Absolute: 0.1 10*3/uL (ref 0.0–0.1)
Basophils Relative: 1 %
Eosinophils Absolute: 0.1 10*3/uL (ref 0.0–0.5)
Eosinophils Relative: 1 %
Immature Granulocytes: 0 %
Lymphocytes Relative: 26 %
Lymphs Abs: 1.4 10*3/uL (ref 0.7–4.0)
Monocytes Absolute: 0.5 10*3/uL (ref 0.1–1.0)
Monocytes Relative: 9 %
Neutro Abs: 3.4 10*3/uL (ref 1.7–7.7)
Neutrophils Relative %: 63 %

## 2023-10-10 LAB — CBC
HCT: 41.7 % (ref 36.0–46.0)
Hemoglobin: 14.3 g/dL (ref 12.0–15.0)
MCH: 37.4 pg — ABNORMAL HIGH (ref 26.0–34.0)
MCHC: 34.3 g/dL (ref 30.0–36.0)
MCV: 109.2 fL — ABNORMAL HIGH (ref 80.0–100.0)
Platelets: 236 10*3/uL (ref 150–400)
RBC: 3.82 MIL/uL — ABNORMAL LOW (ref 3.87–5.11)
RDW: 13.2 % (ref 11.5–15.5)
WBC: 5.5 10*3/uL (ref 4.0–10.5)
nRBC: 0 % (ref 0.0–0.2)

## 2023-10-10 LAB — ETHANOL: Alcohol, Ethyl (B): 35 mg/dL — ABNORMAL HIGH (ref <10)

## 2023-10-10 LAB — RESP PANEL BY RT-PCR (RSV, FLU A&B, COVID)  RVPGX2
Influenza A by PCR: NEGATIVE
Influenza B by PCR: NEGATIVE
Resp Syncytial Virus by PCR: NEGATIVE
SARS Coronavirus 2 by RT PCR: NEGATIVE

## 2023-10-10 LAB — PROTIME-INR
INR: 1.1 (ref 0.8–1.2)
Prothrombin Time: 14.5 s (ref 11.4–15.2)

## 2023-10-10 LAB — SEDIMENTATION RATE: Sed Rate: 1 mm/h (ref 0–20)

## 2023-10-10 MED ORDER — DIPHENHYDRAMINE HCL 50 MG/ML IJ SOLN
25.0000 mg | Freq: Once | INTRAMUSCULAR | Status: AC
  Administered 2023-10-10: 25 mg via INTRAVENOUS
  Filled 2023-10-10: qty 1

## 2023-10-10 MED ORDER — POTASSIUM CHLORIDE 10 MEQ/100ML IV SOLN
10.0000 meq | Freq: Once | INTRAVENOUS | Status: AC
  Administered 2023-10-10: 10 meq via INTRAVENOUS
  Filled 2023-10-10: qty 100

## 2023-10-10 MED ORDER — SODIUM CHLORIDE 0.9 % IV BOLUS
500.0000 mL | Freq: Once | INTRAVENOUS | Status: AC
  Administered 2023-10-10: 500 mL via INTRAVENOUS

## 2023-10-10 MED ORDER — ACETAMINOPHEN 500 MG PO TABS
1000.0000 mg | ORAL_TABLET | Freq: Once | ORAL | Status: AC
  Administered 2023-10-10: 1000 mg via ORAL
  Filled 2023-10-10: qty 2

## 2023-10-10 MED ORDER — IPRATROPIUM-ALBUTEROL 0.5-2.5 (3) MG/3ML IN SOLN
3.0000 mL | Freq: Once | RESPIRATORY_TRACT | Status: AC
  Administered 2023-10-10: 3 mL via RESPIRATORY_TRACT
  Filled 2023-10-10: qty 3

## 2023-10-10 MED ORDER — SODIUM CHLORIDE 0.9% FLUSH
3.0000 mL | Freq: Once | INTRAVENOUS | Status: AC
  Administered 2023-10-10: 3 mL via INTRAVENOUS

## 2023-10-10 MED ORDER — METOCLOPRAMIDE HCL 5 MG/ML IJ SOLN
10.0000 mg | Freq: Once | INTRAMUSCULAR | Status: AC
  Administered 2023-10-10: 10 mg via INTRAVENOUS
  Filled 2023-10-10: qty 2

## 2023-10-10 MED ORDER — ONDANSETRON 4 MG PO TBDP
4.0000 mg | ORAL_TABLET | Freq: Once | ORAL | Status: AC
  Administered 2023-10-10: 4 mg via ORAL
  Filled 2023-10-10: qty 1

## 2023-10-10 MED ORDER — POTASSIUM CHLORIDE CRYS ER 20 MEQ PO TBCR: 20.0000 meq | EXTENDED_RELEASE_TABLET | Freq: Every day | ORAL | 0 refills | Status: DC

## 2023-10-10 MED ORDER — MAGNESIUM SULFATE 2 GM/50ML IV SOLN
2.0000 g | Freq: Once | INTRAVENOUS | Status: AC
  Administered 2023-10-10: 2 g via INTRAVENOUS
  Filled 2023-10-10: qty 50

## 2023-10-10 MED ORDER — POTASSIUM CHLORIDE 20 MEQ PO PACK
40.0000 meq | PACK | Freq: Two times a day (BID) | ORAL | Status: DC
  Administered 2023-10-10: 40 meq via ORAL
  Filled 2023-10-10: qty 2

## 2023-10-10 NOTE — ED Notes (Signed)
 VISUAL ACUITY RIGHT EYE 20/40  LEFT EYE 20/30

## 2023-10-10 NOTE — ED Triage Notes (Signed)
 Pt comes via EM S with c/o pressure behind right eye. Pt states hx of stroke in past. Pt stats weakness and sick on stomach. Pt woke up like this . Pt states this has gotten worse. Pt on thinners.   Pt states dizziness, blurry vision and had some slurred speech at one point.

## 2023-10-10 NOTE — ED Provider Notes (Signed)
 [facility] Provider Note    Event Date/Time   First MD Initiated Contact with Patient 10/10/23 1718     (approximate)   History   Weakness  Pt comes via EM S with c/o pressure behind right eye. Pt states hx of stroke in past. Pt stats weakness and sick on stomach. Pt woke up like this . Pt states this has gotten worse. Pt on thinners.   Pt states dizziness, blurry vision and had some slurred speech at one point.    HPI MIJA EFFERTZ is a 48 y.o. female PMH multiple medical comorbidities including prior CVA, prior DVT, renal artery stenosis, chronic pain syndrome, hypertension presents for evaluation of head pressure - Right periorbital/retro-orbital, pressure sensation, 8/10.  Present since waking this morning.  Went to bed around midnight last night.  Does feel odd sensation in right face.  No focal weakness.  Says that she feels she sees a bright light out of her right eye patient is otherwise intact. - Did have an episode of vomiting today and has some ongoing nausea.  No abdominal pain. - On Plavix  and aspirin , no anticoagulants  Per chart review, last admitted in February of this year for elective operative management of bilateral carotid artery occlusion and right subclavian artery stenosis.  Underwent catheter placement into innominate artery and right subclavian artery to left subclavian artery, angioplasty of right subclavian artery.       Physical Exam   Triage Vital Signs: ED Triage Vitals [10/10/23 1444]  Encounter Vitals Group     BP (!) 102/57     Systolic BP Percentile      Diastolic BP Percentile      Pulse Rate 87     Resp 18     Temp 98 F (36.7 C)     Temp src      SpO2 100 %     Weight 117 lb (53.1 kg)     Height 5\' 2"  (1.575 m)     Head Circumference      Peak Flow      Pain Score      Pain Loc      Pain Education      Exclude from Growth Chart     Most recent vital signs: Vitals:   10/10/23 1912 10/10/23 2200   BP:  (!) 156/73  Pulse:  84  Resp:  18  Temp: 98 F (36.7 C)   SpO2:  95%     General: Awake, no distress.  HEENT:  PERRL, no conjunctival injection bilaterally, extraocular movements intact.  Nontender to palpation throughout the face.  Patient has mild decrease sensation in left face which she states is baseline.  Attempted to collect right ocular pressure multiple times with our measuring device but was unable to any reading, offered Tono-Pen attempted though patient declined.  Eyeball soft to palpation, no mid-dilated pupil, no conjunctival injection. CV:  Good peripheral perfusion. RRR, RP 2+. +systolic murmur.  Resp:  Normal effort. CTAB Abd:  No distention. Nontender to deep palpation throughout Neuro:  Aox4, CN II-XII intact, FNF wnl, finger taps fast b/l, 5/5 strength in bilateral finger extension/grip, arm flexion/extension, EHL/FHL. BUE AG 10+ sec no drift, BLE AG 5+ sec no drift. Ambulates with steady gait. SILT. Negative Rhomberg.     ED Results / Procedures / Treatments   Labs (all labs ordered are listed, but only abnormal results are displayed) Labs Reviewed  CBC - Abnormal; Notable for the following  components:      Result Value   RBC 3.82 (*)    MCV 109.2 (*)    MCH 37.4 (*)    All other components within normal limits  COMPREHENSIVE METABOLIC PANEL WITH GFR - Abnormal; Notable for the following components:   Potassium 2.7 (*)    Calcium  8.0 (*)    Total Protein 5.1 (*)    Albumin 3.0 (*)    AST 427 (*)    ALT 89 (*)    Alkaline Phosphatase 158 (*)    All other components within normal limits  ETHANOL - Abnormal; Notable for the following components:   Alcohol, Ethyl (B) 35 (*)    All other components within normal limits  RESP PANEL BY RT-PCR (RSV, FLU A&B, COVID)  RVPGX2  PROTIME-INR  APTT  DIFFERENTIAL  SEDIMENTATION RATE  C-REACTIVE PROTEIN  I-STAT CREATININE, ED  CBG MONITORING, ED  POC URINE PREG, ED     EKG  Ecg = sinus rhythm, rate  87, no ST elevation or depression, no significant repolarization abnormality, normal axis, normal intervals.   RADIOLOGY Radiology interpreted by myself and radiology reports reviewed.  No acute pathology.  PROCEDURES:  Critical Care performed: No  Procedures   MEDICATIONS ORDERED IN ED: Medications  potassium chloride  (KLOR-CON ) packet 40 mEq (40 mEq Oral Given 10/10/23 2153)  sodium chloride  flush (NS) 0.9 % injection 3 mL (3 mLs Intravenous Given 10/10/23 1807)  ondansetron  (ZOFRAN -ODT) disintegrating tablet 4 mg (4 mg Oral Given 10/10/23 1539)  sodium chloride  0.9 % bolus 500 mL (0 mLs Intravenous Stopped 10/10/23 2232)  metoCLOPramide  (REGLAN ) injection 10 mg (10 mg Intravenous Given 10/10/23 1754)  diphenhydrAMINE  (BENADRYL ) injection 25 mg (25 mg Intravenous Given 10/10/23 1754)  acetaminophen  (TYLENOL ) tablet 1,000 mg (1,000 mg Oral Given 10/10/23 1755)  potassium chloride  10 mEq in 100 mL IVPB (0 mEq Intravenous Stopped 10/10/23 2217)  magnesium  sulfate IVPB 2 g 50 mL (0 g Intravenous Stopped 10/10/23 1909)  ipratropium-albuterol  (DUONEB) 0.5-2.5 (3) MG/3ML nebulizer solution 3 mL (3 mLs Nebulization Given 10/10/23 2232)     IMPRESSION / MDM / ASSESSMENT AND PLAN / ED COURSE  I reviewed the triage vital signs and the nursing notes.                              DDX/MDM/AP: Differential diagnosis includes, but is not limited to, CVA out of acute stroke window and no evidence of LVO on my neurologic exam which was reassuring.  No history to suggest traumatic intracranial hemorrhage.  Consider complex migraine or other benign headache type.  Clinical exam not consistent with glaucoma.  No temporal tenderness to suggest temporal arteritis and no history to suggest optic neuritis.  Doubt CRAO/CRVO given associated pain.  Orders at triage with basic lab work, EKG, CT head.  CT head with no obvious pathology on my reading.  Labs with notable hypokalemia to 2.7, no EKG changes appreciated  on my interpretation.  Also new transaminitis with AST much greater than ALT.  Patient does endorse heavy daily alcohol use, last drink today around 1230, suspect alcoholic pattern.  No abdominal tenderness, will get screening ultrasound to ensure no obvious hepatobiliary pathology.    Plan: - Migraine cocktail - Follow-up CT head - Consider escalation if visual symptoms not resolved after treatment of headache - Potassium repletion and empiric magnesium  repletion  Patient's presentation is most consistent with acute presentation with potential threat to life  or bodily function.  The patient is on the cardiac monitor to evaluate for evidence of arrhythmia and/or significant heart rate changes.  ED course below.  Workup overall unremarkable though patient did have new transaminitis.  Does endorse heavy alcohol use, is in alc hep pattern patient with no GI complaints nausea that was associated with her headache.  Screening ultrasound with cholelithiasis and otherwise unremarkable.  Counseled on alcohol reduction/cessation.  Headache fully resolved with GI cocktail and patient with full resolution of vague sensation of bright light in her right eye.  Overall suspect complex migraine as etiology of presentation.  Did have hypokalemia to 2.7, no obvious EKG changes, repleted with 40 p.o. and 10 IV milliequivalents, magnesium  repleted empirically.  Feels very well, amenable to discharge home.  Plan for PMD follow-up.  ED return precautions in place.  Patient agrees with plan.  Prescribed short course of 20 mEq potassium daily for 3 days.  Recommend Tylenol  as needed for recurrent headache.  Clinical Course as of 10/10/23 2346  Mon Oct 10, 2023  1727 CBC reviewed, unremarkable   [MM]  1727 CMP with notable hypokalemia to 2.7 as well as new transaminitis [MM]  1728 CT head with no obvious acute pathology on my interpretation, radiology read pending [MM]  2007 CTH: IMPRESSION: No acute intracranial  abnormality.   [MM]  2008 Patient reevaluated, headache and vague vision change has resolved.  Ultrasound in progress.  Overall suspect complex migraine as etiology of presentation.  Will await results of ultrasound and anticipate discharge home. [MM]  2216 US : IMPRESSION: Cholelithiasis without secondary signs of acute cholecystitis.   [MM]  2306 Viral swab neg [MM]    Clinical Course User Index [MM] Collis Deaner, MD     FINAL CLINICAL IMPRESSION(S) / ED DIAGNOSES   Final diagnoses:  Nonintractable headache, unspecified chronicity pattern, unspecified headache type  Hypokalemia     Rx / DC Orders   ED Discharge Orders          Ordered    potassium chloride  SA (KLOR-CON  M) 20 MEQ tablet  Daily        10/10/23 2340             Note:  This document was prepared using Dragon voice recognition software and may include unintentional dictation errors.   Collis Deaner, MD 10/10/23 [phone]

## 2023-10-10 NOTE — ED Provider Triage Note (Signed)
 Emergency Medicine Provider Triage Evaluation Note  Joanna Garcia , a 48 y.o. female  was evaluated in triage.  Pt complains of weakness, dizziness, pressure behind her eyes, history of stroke, is on blood thinner.  Review of Systems  Positive:  Negative:   Physical Exam  BP (!) 102/57   Pulse 87   Temp 98 F (36.7 C)   Resp 18   Ht 5\' 2"  (1.575 m)   Wt 53.1 kg   SpO2 100%   BMI 21.40 kg/m  Gen:   Awake, no distress   Resp:  Normal effort  MSK:   Moves extremities without difficulty  Other:    Medical Decision Making  Medically screening exam initiated at 2:45 PM.  Appropriate orders placed.  Joanna Garcia was informed that the remainder of the evaluation will be completed by another provider, this initial triage assessment does not replace that evaluation, and the importance of remaining in the ED until their evaluation is complete.     Joanna Figures, PA-C 10/10/23 1446

## 2023-10-10 NOTE — Discharge Instructions (Addendum)
 Your evaluation in the emergency department was overall reassuring, and I suspect you likely had a migraine headache.  Fortunately this resolved with treatment.  Please do follow-up with your primary care doctor for reevaluation, and return to the emergency department with any new or worsening symptoms.  Your potassium was low today, and I have provided you potassium pills to take over the next 3 days to help correct this.

## 2023-10-11 ENCOUNTER — Other Ambulatory Visit: Payer: Self-pay | Admitting: Family Medicine

## 2023-10-11 DIAGNOSIS — R112 Nausea with vomiting, unspecified: Secondary | ICD-10-CM

## 2023-10-11 LAB — C-REACTIVE PROTEIN: CRP: <0.5 mg/dL (ref <1.0)

## 2023-10-12 ENCOUNTER — Encounter: Payer: Self-pay | Admitting: *Deleted

## 2023-10-12 DIAGNOSIS — Z79899 Other long term (current) drug therapy: Secondary | ICD-10-CM | POA: Diagnosis not present

## 2023-10-12 DIAGNOSIS — F17210 Nicotine dependence, cigarettes, uncomplicated: Secondary | ICD-10-CM | POA: Diagnosis not present

## 2023-10-12 DIAGNOSIS — I10 Essential (primary) hypertension: Secondary | ICD-10-CM | POA: Diagnosis not present

## 2023-10-12 DIAGNOSIS — E876 Hypokalemia: Secondary | ICD-10-CM | POA: Insufficient documentation

## 2023-10-12 DIAGNOSIS — F1010 Alcohol abuse, uncomplicated: Secondary | ICD-10-CM | POA: Insufficient documentation

## 2023-10-12 DIAGNOSIS — R197 Diarrhea, unspecified: Secondary | ICD-10-CM | POA: Insufficient documentation

## 2023-10-12 DIAGNOSIS — R112 Nausea with vomiting, unspecified: Secondary | ICD-10-CM | POA: Diagnosis not present

## 2023-10-12 DIAGNOSIS — K7010 Alcoholic hepatitis without ascites: Secondary | ICD-10-CM | POA: Diagnosis not present

## 2023-10-12 DIAGNOSIS — Z7902 Long term (current) use of antithrombotics/antiplatelets: Secondary | ICD-10-CM | POA: Insufficient documentation

## 2023-10-12 DIAGNOSIS — R7401 Elevation of levels of liver transaminase levels: Secondary | ICD-10-CM | POA: Insufficient documentation

## 2023-10-12 DIAGNOSIS — Z8673 Personal history of transient ischemic attack (TIA), and cerebral infarction without residual deficits: Secondary | ICD-10-CM | POA: Insufficient documentation

## 2023-10-12 DIAGNOSIS — Z7982 Long term (current) use of aspirin: Secondary | ICD-10-CM | POA: Insufficient documentation

## 2023-10-12 DIAGNOSIS — Z86718 Personal history of other venous thrombosis and embolism: Secondary | ICD-10-CM | POA: Diagnosis not present

## 2023-10-12 DIAGNOSIS — R1011 Right upper quadrant pain: Secondary | ICD-10-CM | POA: Diagnosis present

## 2023-10-12 DIAGNOSIS — K219 Gastro-esophageal reflux disease without esophagitis: Secondary | ICD-10-CM | POA: Insufficient documentation

## 2023-10-12 DIAGNOSIS — K8012 Calculus of gallbladder with acute and chronic cholecystitis without obstruction: Secondary | ICD-10-CM | POA: Diagnosis not present

## 2023-10-12 LAB — COMPREHENSIVE METABOLIC PANEL WITH GFR
ALT: 89 U/L — ABNORMAL HIGH (ref 0–44)
AST: 151 U/L — ABNORMAL HIGH (ref 15–41)
Albumin: 3.4 g/dL — ABNORMAL LOW (ref 3.5–5.0)
Alkaline Phosphatase: 196 U/L — ABNORMAL HIGH (ref 38–126)
Anion gap: 14 (ref 5–15)
BUN: <5 mg/dL — ABNORMAL LOW (ref 6–20)
CO2: 25 mmol/L (ref 22–32)
Calcium: 8.2 mg/dL — ABNORMAL LOW (ref 8.9–10.3)
Chloride: 102 mmol/L (ref 98–111)
Creatinine, Ser: 0.68 mg/dL (ref 0.44–1.00)
GFR, Estimated: >60 mL/min (ref >60)
Glucose, Bld: 109 mg/dL — ABNORMAL HIGH (ref 70–99)
Potassium: 3.3 mmol/L — ABNORMAL LOW (ref 3.5–5.1)
Sodium: 141 mmol/L (ref 135–145)
Total Bilirubin: 1.2 mg/dL (ref 0.0–1.2)
Total Protein: 6.1 g/dL — ABNORMAL LOW (ref 6.5–8.1)

## 2023-10-12 LAB — CBC
HCT: 44.3 % (ref 36.0–46.0)
Hemoglobin: 15.6 g/dL — ABNORMAL HIGH (ref 12.0–15.0)
MCH: 38.2 pg — ABNORMAL HIGH (ref 26.0–34.0)
MCHC: 35.2 g/dL (ref 30.0–36.0)
MCV: 108.6 fL — ABNORMAL HIGH (ref 80.0–100.0)
Platelets: 220 10*3/uL (ref 150–400)
RBC: 4.08 MIL/uL (ref 3.87–5.11)
RDW: 12.7 % (ref 11.5–15.5)
WBC: 9.7 10*3/uL (ref 4.0–10.5)
nRBC: 0 % (ref 0.0–0.2)

## 2023-10-12 LAB — LIPASE, BLOOD: Lipase: 20 U/L (ref 11–51)

## 2023-10-12 NOTE — ED Triage Notes (Signed)
 Pt brought in via ems from home with abd pain.  Pt has nausea and vomiting .  Pt also reports a lot of belching.  No chest pain or sob.   Pt alert speech clear.

## 2023-10-12 NOTE — Telephone Encounter (Signed)
 Requested medication (s) are due for refill today-yes  Requested medication (s) are on the active medication list -yes  Future visit scheduled -no  Last refill: 08/17/23 #30  Notes to clinic: non delegated Rx  Requested Prescriptions  Pending Prescriptions Disp Refills   ondansetron  (ZOFRAN -ODT) 4 MG disintegrating tablet [Pharmacy Med Name: ONDANSETRON  ODT 4 MG TABLET] 30 tablet 0    Sig: TAKE 1 TABLET EVERY 8 HOURS AS NEEDED FOR NAUSEA AND VOMITING.     Not Delegated - Gastroenterology: Antiemetics - ondansetron  Failed - 10/12/2023  9:02 AM      Failed - This refill cannot be delegated      Failed - AST in normal range and within 360 days    AST  Date Value Ref Range Status  10/10/2023 427 (H) 15 - 41 U/L Final         Failed - ALT in normal range and within 360 days    ALT  Date Value Ref Range Status  10/10/2023 89 (H) 0 - 44 U/L Final         Failed - Valid encounter within last 6 months    Recent Outpatient Visits           3 weeks ago Subclavian artery stenosis, right [facility])    [facility], Kayleen Party, DO                 Requested Prescriptions  Pending Prescriptions Disp Refills   ondansetron  (ZOFRAN -ODT) 4 MG disintegrating tablet [Pharmacy Med Name: ONDANSETRON  ODT 4 MG TABLET] 30 tablet 0    Sig: TAKE 1 TABLET EVERY 8 HOURS AS NEEDED FOR NAUSEA AND VOMITING.     Not Delegated - Gastroenterology: Antiemetics - ondansetron  Failed - 10/12/2023  9:02 AM      Failed - This refill cannot be delegated      Failed - AST in normal range and within 360 days    AST  Date Value Ref Range Status  10/10/2023 427 (H) 15 - 41 U/L Final         Failed - ALT in normal range and within 360 days    ALT  Date Value Ref Range Status  10/10/2023 89 (H) 0 - 44 U/L Final         Failed - Valid encounter within last 6 months    Recent Outpatient Visits           3 weeks ago Subclavian artery stenosis, right [facility])   [facility], Kayleen Party, [place]

## 2023-10-12 NOTE — ED Triage Notes (Signed)
 EMS brings pt in from home for c/o N/V x 5 days

## 2023-10-13 ENCOUNTER — Observation Stay: Admitting: Anesthesiology

## 2023-10-13 ENCOUNTER — Other Ambulatory Visit: Payer: Self-pay

## 2023-10-13 ENCOUNTER — Encounter: Payer: Self-pay | Admitting: Internal Medicine

## 2023-10-13 ENCOUNTER — Observation Stay

## 2023-10-13 ENCOUNTER — Observation Stay
Admission: EM | Admit: 2023-10-13 | Discharge: 2023-10-14 | Disposition: A | Discharge status: Home / Self Care | Attending: Internal Medicine | Admitting: Internal Medicine

## 2023-10-13 ENCOUNTER — Encounter
Admission: EM | Disposition: A | Discharge status: Home / Self Care | Payer: Self-pay | Source: Home / Self Care | Attending: Emergency Medicine

## 2023-10-13 ENCOUNTER — Emergency Department

## 2023-10-13 DIAGNOSIS — R112 Nausea with vomiting, unspecified: Secondary | ICD-10-CM

## 2023-10-13 DIAGNOSIS — K8050 Calculus of bile duct without cholangitis or cholecystitis without obstruction: Secondary | ICD-10-CM | POA: Diagnosis not present

## 2023-10-13 DIAGNOSIS — K8012 Calculus of gallbladder with acute and chronic cholecystitis without obstruction: Secondary | ICD-10-CM | POA: Diagnosis not present

## 2023-10-13 DIAGNOSIS — K8020 Calculus of gallbladder without cholecystitis without obstruction: Principal | ICD-10-CM

## 2023-10-13 DIAGNOSIS — R7989 Other specified abnormal findings of blood chemistry: Secondary | ICD-10-CM

## 2023-10-13 HISTORY — PX: INDOCYANINE GREEN FLUORESCENCE IMAGING (ICG): SHX7595

## 2023-10-13 LAB — URINALYSIS, ROUTINE W REFLEX MICROSCOPIC
Bilirubin Urine: NEGATIVE
Glucose, UA: NEGATIVE mg/dL
Hgb urine dipstick: NEGATIVE
Ketones, ur: NEGATIVE mg/dL
Leukocytes,Ua: NEGATIVE
Nitrite: NEGATIVE
Protein, ur: NEGATIVE mg/dL
Specific Gravity, Urine: 1.011 (ref 1.005–1.030)
pH: 7 (ref 5.0–8.0)

## 2023-10-13 SURGERY — CHOLECYSTECTOMY, ROBOT-ASSISTED, LAPAROSCOPIC
Anesthesia: General

## 2023-10-13 MED ORDER — GADOBUTROL 1 MMOL/ML IV SOLN
5.0000 mL | Freq: Once | INTRAVENOUS | Status: AC | PRN
  Administered 2023-10-13: 5 mL via INTRAVENOUS

## 2023-10-13 MED ORDER — SODIUM CHLORIDE 0.9 % IV SOLN: INTRAVENOUS | Status: DC

## 2023-10-13 MED ORDER — INDOCYANINE GREEN 25 MG IV SOLR
INTRAVENOUS | Status: AC
Start: 2023-10-13 — End: ?
  Filled 2023-10-13: qty 10

## 2023-10-13 MED ORDER — CEFAZOLIN SODIUM-DEXTROSE 2-3 GM-%(50ML) IV SOLR
INTRAVENOUS | Status: DC | PRN
  Administered 2023-10-13: 2 g via INTRAVENOUS

## 2023-10-13 MED ORDER — OXYCODONE HCL 5 MG/5ML PO SOLN: 5.0000 mg | Freq: Once | ORAL | Status: AC | PRN

## 2023-10-13 MED ORDER — CEFAZOLIN SODIUM-DEXTROSE 2-4 GM/100ML-% IV SOLN
INTRAVENOUS | Status: AC
  Filled 2023-10-13: qty 100

## 2023-10-13 MED ORDER — PANTOPRAZOLE SODIUM 40 MG IV SOLR
40.0000 mg | INTRAVENOUS | Status: DC
  Administered 2023-10-14: 40 mg via INTRAVENOUS
  Filled 2023-10-13: qty 10

## 2023-10-13 MED ORDER — DEXMEDETOMIDINE HCL IN NACL 80 MCG/20ML IV SOLN
INTRAVENOUS | Status: DC | PRN
  Administered 2023-10-13: 8 ug via INTRAVENOUS

## 2023-10-13 MED ORDER — OXYCODONE HCL 5 MG PO TABS
5.0000 mg | ORAL_TABLET | Freq: Once | ORAL | Status: AC | PRN
  Administered 2023-10-13: 5 mg via ORAL

## 2023-10-13 MED ORDER — MIDAZOLAM HCL 2 MG/2ML IJ SOLN
INTRAMUSCULAR | Status: DC | PRN
  Administered 2023-10-13: 2 mg via INTRAVENOUS

## 2023-10-13 MED ORDER — INDOCYANINE GREEN 25 MG IV SOLR
1.2500 mg | Freq: Once | INTRAVENOUS | Status: AC
  Administered 2023-10-13: 1.25 mg via INTRAVENOUS
  Filled 2023-10-13: qty 10

## 2023-10-13 MED ORDER — PROPOFOL 10 MG/ML IV BOLUS
INTRAVENOUS | Status: AC
  Filled 2023-10-13: qty 20

## 2023-10-13 MED ORDER — ONDANSETRON HCL 4 MG/2ML IJ SOLN
INTRAMUSCULAR | Status: DC | PRN
  Administered 2023-10-13: 4 mg via INTRAVENOUS

## 2023-10-13 MED ORDER — ACETAMINOPHEN 650 MG RE SUPP: 650.0000 mg | Freq: Four times a day (QID) | RECTAL | Status: DC | PRN

## 2023-10-13 MED ORDER — ACETAMINOPHEN 10 MG/ML IV SOLN
INTRAVENOUS | Status: DC | PRN
Start: 2023-10-13 — End: 2023-10-13
  Administered 2023-10-13: 1000 mg via INTRAVENOUS

## 2023-10-13 MED ORDER — FENTANYL CITRATE (PF) 100 MCG/2ML IJ SOLN
INTRAMUSCULAR | Status: AC
  Filled 2023-10-13: qty 2

## 2023-10-13 MED ORDER — LIDOCAINE HCL (CARDIAC) PF 100 MG/5ML IV SOSY
PREFILLED_SYRINGE | INTRAVENOUS | Status: DC | PRN
  Administered 2023-10-13: 50 mg via INTRAVENOUS

## 2023-10-13 MED ORDER — PHENYLEPHRINE 80 MCG/ML (10ML) SYRINGE FOR IV PUSH (FOR BLOOD PRESSURE SUPPORT)
PREFILLED_SYRINGE | INTRAVENOUS | Status: AC
Start: 2023-10-13 — End: ?
  Filled 2023-10-13: qty 10

## 2023-10-13 MED ORDER — BUPIVACAINE-EPINEPHRINE 0.25% -1:200000 IJ SOLN
INTRAMUSCULAR | Status: DC | PRN
  Administered 2023-10-13: 30 mL

## 2023-10-13 MED ORDER — ONDANSETRON HCL 4 MG/2ML IJ SOLN
INTRAMUSCULAR | Status: AC
  Filled 2023-10-13: qty 2

## 2023-10-13 MED ORDER — ONDANSETRON HCL 4 MG/2ML IJ SOLN
4.0000 mg | Freq: Once | INTRAMUSCULAR | Status: AC
  Administered 2023-10-13: 4 mg via INTRAVENOUS
  Filled 2023-10-13: qty 2

## 2023-10-13 MED ORDER — DEXAMETHASONE SODIUM PHOSPHATE 10 MG/ML IJ SOLN
INTRAMUSCULAR | Status: AC
  Filled 2023-10-13: qty 1

## 2023-10-13 MED ORDER — SUGAMMADEX SODIUM 200 MG/2ML IV SOLN
INTRAVENOUS | Status: DC | PRN
Start: 2023-10-13 — End: 2023-10-13
  Administered 2023-10-13: 150 mg via INTRAVENOUS

## 2023-10-13 MED ORDER — LIDOCAINE HCL (PF) 2 % IJ SOLN
INTRAMUSCULAR | Status: AC
  Filled 2023-10-13: qty 5

## 2023-10-13 MED ORDER — DROPERIDOL 2.5 MG/ML IJ SOLN: 0.6250 mg | Freq: Once | INTRAMUSCULAR | Status: DC | PRN

## 2023-10-13 MED ORDER — FENTANYL CITRATE (PF) 100 MCG/2ML IJ SOLN
INTRAMUSCULAR | Status: DC | PRN
  Administered 2023-10-13 (×2): 50 ug via INTRAVENOUS

## 2023-10-13 MED ORDER — LACTATED RINGERS IV SOLN: INTRAVENOUS | Status: DC | PRN

## 2023-10-13 MED ORDER — FENTANYL CITRATE (PF) 100 MCG/2ML IJ SOLN: 25.0000 ug | INTRAMUSCULAR | Status: DC | PRN

## 2023-10-13 MED ORDER — MORPHINE SULFATE (PF) 2 MG/ML IV SOLN
2.0000 mg | INTRAVENOUS | Status: DC | PRN
  Administered 2023-10-13 – 2023-10-14 (×4): 2 mg via INTRAVENOUS
  Filled 2023-10-13 (×4): qty 1

## 2023-10-13 MED ORDER — DEXAMETHASONE SODIUM PHOSPHATE 10 MG/ML IJ SOLN
INTRAMUSCULAR | Status: DC | PRN
Start: 2023-10-13 — End: 2023-10-13
  Administered 2023-10-13: 10 mg via INTRAVENOUS

## 2023-10-13 MED ORDER — HYDRALAZINE HCL 20 MG/ML IJ SOLN: 10.0000 mg | Freq: Four times a day (QID) | INTRAMUSCULAR | Status: DC | PRN

## 2023-10-13 MED ORDER — ACETAMINOPHEN 10 MG/ML IV SOLN
INTRAVENOUS | Status: AC
  Filled 2023-10-13: qty 100

## 2023-10-13 MED ORDER — ACETAMINOPHEN 325 MG PO TABS: 650.0000 mg | ORAL_TABLET | Freq: Four times a day (QID) | ORAL | Status: DC | PRN

## 2023-10-13 MED ORDER — ACETAMINOPHEN 10 MG/ML IV SOLN: 1000.0000 mg | Freq: Once | INTRAVENOUS | Status: DC | PRN

## 2023-10-13 MED ORDER — BUPIVACAINE-EPINEPHRINE (PF) 0.25% -1:200000 IJ SOLN
INTRAMUSCULAR | Status: AC
  Filled 2023-10-13: qty 30

## 2023-10-13 MED ORDER — PHENYLEPHRINE 80 MCG/ML (10ML) SYRINGE FOR IV PUSH (FOR BLOOD PRESSURE SUPPORT)
PREFILLED_SYRINGE | INTRAVENOUS | Status: DC | PRN
Start: 2023-10-13 — End: 2023-10-13
  Administered 2023-10-13: 160 ug via INTRAVENOUS

## 2023-10-13 MED ORDER — ROCURONIUM BROMIDE 100 MG/10ML IV SOLN
INTRAVENOUS | Status: DC | PRN
  Administered 2023-10-13: 50 mg via INTRAVENOUS

## 2023-10-13 MED ORDER — ROCURONIUM BROMIDE 10 MG/ML (PF) SYRINGE
PREFILLED_SYRINGE | INTRAVENOUS | Status: AC
  Filled 2023-10-13: qty 10

## 2023-10-13 MED ORDER — OXYCODONE HCL 5 MG PO TABS
ORAL_TABLET | ORAL | Status: AC
  Filled 2023-10-13: qty 1

## 2023-10-13 MED ORDER — MIDAZOLAM HCL 2 MG/2ML IJ SOLN
INTRAMUSCULAR | Status: AC
  Filled 2023-10-13: qty 2

## 2023-10-13 MED ORDER — NICOTINE 21 MG/24HR TD PT24
21.0000 mg | MEDICATED_PATCH | Freq: Every day | TRANSDERMAL | Status: DC
  Administered 2023-10-13: 21 mg via TRANSDERMAL
  Filled 2023-10-13: qty 1

## 2023-10-13 MED ORDER — HYDROMORPHONE HCL 1 MG/ML IJ SOLN
1.0000 mg | Freq: Once | INTRAMUSCULAR | Status: AC
  Administered 2023-10-13: 1 mg via INTRAVENOUS
  Filled 2023-10-13: qty 1

## 2023-10-13 MED ORDER — PROPOFOL 10 MG/ML IV BOLUS
INTRAVENOUS | Status: DC | PRN
  Administered 2023-10-13: 150 mg via INTRAVENOUS

## 2023-10-13 MED ORDER — MORPHINE SULFATE (PF) 4 MG/ML IV SOLN
4.0000 mg | Freq: Once | INTRAVENOUS | Status: AC
  Administered 2023-10-13: 4 mg via INTRAVENOUS
  Filled 2023-10-13: qty 1

## 2023-10-13 MED ORDER — ONDANSETRON HCL 4 MG/2ML IJ SOLN
4.0000 mg | Freq: Four times a day (QID) | INTRAMUSCULAR | Status: DC | PRN
  Administered 2023-10-13 (×2): 4 mg via INTRAVENOUS
  Filled 2023-10-13 (×2): qty 2

## 2023-10-13 MED ORDER — SODIUM CHLORIDE 0.9 % IV BOLUS (SEPSIS)
1000.0000 mL | Freq: Once | INTRAVENOUS | Status: AC
  Administered 2023-10-13: 1000 mL via INTRAVENOUS

## 2023-10-13 MED ORDER — ONDANSETRON HCL 4 MG PO TABS: 4.0000 mg | ORAL_TABLET | Freq: Four times a day (QID) | ORAL | Status: DC | PRN

## 2023-10-13 SURGICAL SUPPLY — 42 items
BAG PRESSURE INF REUSE 1000 (BAG) IMPLANT
CANNULA REDUCER 12-8 DVNC XI (CANNULA) ×2 IMPLANT
CATH REDDICK CHOLANGI 4FR 50CM (CATHETERS) IMPLANT
CAUTERY HOOK MNPLR 1.6 DVNC XI (INSTRUMENTS) ×2 IMPLANT
CLIP LIGATING HEM O LOK PURPLE (MISCELLANEOUS) IMPLANT
CLIP LIGATING HEMO O LOK GREEN (MISCELLANEOUS) ×2 IMPLANT
DERMABOND ADVANCED .7 DNX12 (GAUZE/BANDAGES/DRESSINGS) ×2 IMPLANT
DRAPE ARM DVNC X/XI (DISPOSABLE) ×8 IMPLANT
DRAPE C-ARM XRAY 36X54 (DRAPES) IMPLANT
DRAPE COLUMN DVNC XI (DISPOSABLE) ×2 IMPLANT
ELECTRODE REM PT RTRN 9FT ADLT (ELECTROSURGICAL) ×2 IMPLANT
FORCEPS BPLR 8 MD DVNC XI (FORCEP) ×2 IMPLANT
FORCEPS BPLR FENES DVNC XI (FORCEP) ×2 IMPLANT
FORCEPS PROGRASP DVNC XI (FORCEP) ×2 IMPLANT
GLOVE BIO SURGEON STRL SZ 6.5 (GLOVE) ×4 IMPLANT
GLOVE BIOGEL PI IND STRL 6.5 (GLOVE) ×4 IMPLANT
GOWN STRL REUS W/ TWL LRG LVL3 (GOWN DISPOSABLE) ×6 IMPLANT
GRASPER SUT TROCAR 14GX15 (MISCELLANEOUS) ×2 IMPLANT
IRRIGATOR SUCT 8 DISP DVNC XI (IRRIGATION / IRRIGATOR) IMPLANT
IV CATH ANGIO 12GX3 LT BLUE (NEEDLE) IMPLANT
IV NS 1000ML BAXH (IV SOLUTION) IMPLANT
KIT PINK PAD W/HEAD ARE REST (MISCELLANEOUS) ×2 IMPLANT
KIT PINK PAD W/HEAD ARM REST (MISCELLANEOUS) ×2 IMPLANT
LABEL OR SOLS (LABEL) ×2 IMPLANT
MANIFOLD NEPTUNE II (INSTRUMENTS) ×2 IMPLANT
NDL HYPO 22X1.5 SAFETY MO (MISCELLANEOUS) ×2 IMPLANT
NDL INSUFFLATION 14GA 120MM (NEEDLE) ×2 IMPLANT
NEEDLE HYPO 22X1.5 SAFETY MO (MISCELLANEOUS) ×2 IMPLANT
NEEDLE INSUFFLATION 14GA 120MM (NEEDLE) ×2 IMPLANT
NS IRRIG 500ML POUR BTL (IV SOLUTION) ×2 IMPLANT
OBTURATOR OPTICALSTD 8 DVNC (TROCAR) ×2 IMPLANT
PACK LAP CHOLECYSTECTOMY (MISCELLANEOUS) ×2 IMPLANT
SEAL UNIV 5-12 XI (MISCELLANEOUS) ×8 IMPLANT
SET TUBE SMOKE EVAC HIGH FLOW (TUBING) ×2 IMPLANT
SOLUTION ELECTROSURG ANTI STCK (MISCELLANEOUS) ×2 IMPLANT
SPIKE FLUID TRANSFER (MISCELLANEOUS) ×4 IMPLANT
SPONGE T-LAP 4X18 ~~LOC~~+RFID (SPONGE) IMPLANT
SUT VICRYL 0 UR6 27IN ABS (SUTURE) ×2 IMPLANT
SUTURE MNCRL 4-0 27XMF (SUTURE) ×2 IMPLANT
SYSTEM BAG RETRIEVAL 10MM (BASKET) ×2 IMPLANT
TRAP FLUID SMOKE EVACUATOR (MISCELLANEOUS) ×2 IMPLANT
WATER STERILE IRR 500ML POUR (IV SOLUTION) ×2 IMPLANT

## 2023-10-13 NOTE — ED Notes (Signed)
 O2 decreased to 89% on RA, placed on 2L [place]. O2 increased to 96%. Pt is daily smoker approx 3/4 pack/day.

## 2023-10-13 NOTE — Op Note (Signed)
 Preoperative diagnosis: Acute cholecystitis  Postoperative diagnosis: Same  Procedure: Robotic Assisted Laparoscopic Cholecystectomy.   Anesthesia: GETA   Surgeon: Dr. Dortha Gauss  Wound Classification: Clean Contaminated  Indications: Patient is a 48 y.o. female developed right upper quadrant pain, nausea, vomiting and on workup was found to have cholelithiasis with a normal common duct. Elevated alkaline phosphatase but MRCP negative for choledocholithiasis. Robotic Assisted Laparoscopic cholecystectomy was elected.  Findings:  Critical view of safety achieved Cystic duct and artery identified, ligated and divided Adequate hemostasis       Description of procedure: The patient was placed on the operating table in the supine position. General anesthesia was induced. A time-out was completed verifying correct patient, procedure, site, positioning, and implant(s) and/or special equipment prior to beginning this procedure. An orogastric tube was placed. The abdomen was prepped and draped in the usual sterile fashion.  An incision was made in a natural skin line below the umbilicus.  The fascia was elevated and the Veress needle inserted. Proper position was confirmed by aspiration and saline meniscus test.  The abdomen was insufflated with carbon dioxide to a pressure of 15 mmHg. The patient tolerated insufflation well. A 8-mm trocar was then inserted in optiview fashion.  The laparoscope was inserted and the abdomen inspected. No injuries from initial trocar placement were noted. Additional trocars were then inserted in the following locations: an 8-mm trocar in the left lateral abdomen, and another two 8-mm trocars to the right side of the abdomen 5 cm appart. The umbilical trocar was changed to a 12 mm trocar all under direct visualization. The abdomen was inspected and no abnormalities were found. The table was placed in the reverse Trendelenburg position with the right side up. The  robotic arms were docked and target anatomy identified. Instrument inserted under direct visualization.  Filmy adhesions between the gallbladder and omentum, duodenum and transverse colon were lysed with electrocautery. The dome of the gallbladder was grasped with a prograsp and retracted over the dome of the liver. The infundibulum was also grasped with an atraumatic grasper and retracted toward the right lower quadrant. This maneuver exposed Calot's triangle. The peritoneum overlying the gallbladder infundibulum was then incised and the cystic duct and cystic artery identified and circumferentially dissected. Critical view of safety reviewed before ligating any structure. Firefly images taken to visualize biliary ducts. The cystic duct and cystic artery were then doubly clipped and divided close to the gallbladder.  The gallbladder was then dissected from its peritoneal attachments by electrocautery. Hemostasis was checked and the gallbladder and contained stones were removed using an endoscopic retrieval bag. There was no evidence of bleeding from the gallbladder fossa or cystic artery or leakage of the bile from the cystic duct stump. Secondary trocars were removed under direct vision. No bleeding was noted. The robotic arms were undoked. The scope was withdrawn and the umbilical trocar removed. The abdomen was allowed to collapse. The fascia of the 12mm trocar sites was closed with figure-of-eight 0 vicryl sutures. The skin was closed with subcuticular sutures of 4-0 monocryl and topical skin adhesive. The orogastric tube was removed.  The patient tolerated the procedure well and was taken to the postanesthesia care unit in stable condition.   Specimen: Gallbladder  Complications: None  EBL: 5 mL

## 2023-10-13 NOTE — Progress Notes (Signed)
   10/13/23 1300  Spiritual Encounters  Type of Visit Initial  Care provided to: Pt not available  Referral source Nurse (RN/NT/LPN)  Reason for visit Advance directives  OnCall Visit No   Will follow as appropriate. Chaplain spiritual support services remain available as the need arises.

## 2023-10-13 NOTE — H&P (Signed)
 History and Physical    Patient: Joanna Garcia QIH:[id number] DOB: 1976/01/01 DOA: 10/13/2023 DOS: the patient was seen and examined on 10/13/2023 PCP: Raina Bunting, DO  Patient coming from: Home  Chief Complaint: Abdominal pain Chief Complaint  Patient presents with   Emesis   HPI: Joanna Garcia is a 48 y.o. female with medical history significant of history of DVT several years ago not currently on anticoagulation, GERD, hypertension, previous history of stroke with no residual weakness, patient was otherwise well until the last couple of days when she started experiencing right upper quadrant pain and therefore decided to come to the emergency room for further management.  Pain has no exacerbating factor with intensity of 7/10 relieved by pain medication administered.  Abdominal pain was associated with nausea and vomiting as well as diarrhea.  Diarrhea is however resolved and currently patient is having nausea.  He denies chest pain, cough or urinary complaints. ED course: Temperature 98, respiratory rate 18, pulse 120, blood pressure 173 1/3. Abdominal imaging showing findings of cholelithiasis and subsequently MRCP ordered that did not show any biliary pathology.  In the emergency room general surgery was consulted who agreed for TRH to admit patient and they will see on consultation.  Also gastroenterologist was informed and according to him if MRCP comes back positive then they will be consulted officially. Review of Systems: As mentioned in the history of present illness. All other systems reviewed and are negative. Past Medical History:  Diagnosis Date   DVT (deep venous thrombosis) (HCC)    GERD (gastroesophageal reflux disease)    Hypertension    Polycythemia    Polyneuropathy    Stroke [facility])    Past Surgical History:  Procedure Laterality Date   CAROTID ANGIOGRAPHY Bilateral 07/28/2023   Procedure: CAROTID ANGIOGRAPHY;  Surgeon: Celso College, MD;  Location:  [facility] INVASIVE CV LAB;  Service: Cardiovascular;  Laterality: Bilateral;   COLONOSCOPY N/A 03/16/2019   Procedure: COLONOSCOPY;  Surgeon: Toledo, Alphonsus Jeans, MD;  Location: [facility] ENDOSCOPY;  Service: Gastroenterology;  Laterality: N/A;   ESOPHAGOGASTRODUODENOSCOPY N/A 03/16/2019   Procedure: ESOPHAGOGASTRODUODENOSCOPY (EGD);  Surgeon: Toledo, Alphonsus Jeans, MD;  Location: [facility] ENDOSCOPY;  Service: Gastroenterology;  Laterality: N/A;   IR ANGIO INTRA EXTRACRAN SEL COM CAROTID INNOMINATE UNI L MOD SED  02/09/2021   IR INTRAVSC STENT CERV CAROTID W/EMB-PROT MOD SED INCL ANGIO  02/09/2021   IR PERCUTANEOUS ART THROMBECTOMY/INFUSION INTRACRANIAL INC DIAG ANGIO  02/09/2021   IR US  GUIDE VASC ACCESS RIGHT  02/09/2021   NO PAST SURGERIES     RADIOLOGY WITH ANESTHESIA N/A 02/09/2021   Procedure: IR WITH ANESTHESIA;  Surgeon: Radiologist, Medication, MD;  Location: [facility] OR;  Service: Radiology;  Laterality: N/A;   RENAL ANGIOGRAPHY Left 01/24/2023   Procedure: RENAL ANGIOGRAPHY;  Surgeon: Celso College, MD;  Location: [facility] INVASIVE CV LAB;  Service: Cardiovascular;  Laterality: Left;   Social History:  reports that she has been smoking cigarettes. She started smoking about 31 years ago. She has a 23.5 pack-year smoking history. She has never used smokeless tobacco. She reports current alcohol use. She reports that she does not currently use drugs after having used the following drugs: Marijuana.  No Known Allergies  Family History  Problem Relation Age of Onset   Hypertension Mother    Hyperlipidemia Mother    Heart disease Mother    COPD Mother    Hemochromatosis Mother    Hypertension Father    Hyperlipidemia Father  Cancer Father        prostate   Hemochromatosis Maternal Aunt    Hemochromatosis Maternal Aunt    Hemochromatosis Maternal Uncle    Leukemia Paternal Uncle    Cancer Paternal Grandmother    Breast cancer Paternal Grandmother     Prior to Admission medications   Medication Sig  Start Date End Date Taking? Authorizing Provider  amLODipine  (NORVASC ) 5 MG tablet Take 1 tablet (5 mg total) by mouth daily. 08/25/23   Karamalegos, Kayleen Party, DO  aspirin  81 MG chewable tablet Chew and swallow 1 tablet (81 mg total) by mouth daily. 02/12/21   Rinehuls, Helga Loan, PA-C  atorvastatin  (LIPITOR ) 80 MG tablet Take 1 tablet (80 mg total) by mouth daily. 07/27/23   Karamalegos, Kayleen Party, DO  clopidogrel  (PLAVIX ) 75 MG tablet Take 1 tablet (75 mg total) by mouth daily. 09/30/23   Karamalegos, Kayleen Party, DO  cyanocobalamin  (VITAMIN B12) 1000 MCG/ML injection Inject 1 mL (1,000 mcg total) into the muscle every 30 (thirty) days. For 4 months 07/06/23   Raina Bunting, DO  gabapentin  (NEURONTIN ) 300 MG capsule Take 3 caps = 900mg  dose in morning and afternoon and take 4 caps = 1200mg  in evening. Max dose is 10 capsules in 24 hours. 08/09/23   Karamalegos, Kayleen Party, DO  latanoprost (XALATAN) 0.005 % ophthalmic solution SMARTSIG:In Eye(s) 08/17/23   [provider]  losartan  (COZAAR ) 25 MG tablet Take 1 tablet (25 mg total) by mouth daily. 04/20/23 09/05/24  Ava Boatman, NP  meloxicam  (MOBIC ) 15 MG tablet TAKE ONE TABLET DAILY AS NEEDED. 02/01/23   Romeo Co, Kayleen Party, DO  ondansetron  (ZOFRAN -ODT) 4 MG disintegrating tablet TAKE 1 TABLET EVERY 8 HOURS AS NEEDED FOR NAUSEA AND VOMITING. 10/12/23   Romeo Co, Kayleen Party, DO  oxyCODONE -acetaminophen  (PERCOCET/ROXICET) 5-325 MG tablet Take 1 tablet by mouth every 4 (four) hours as needed for severe pain (pain score 7-10). Max 4 per day 09/15/23   Raina Bunting, DO  pantoprazole  (PROTONIX ) 40 MG tablet Take 1 tablet (40 mg total) by mouth daily before breakfast. 11/18/22   Romeo Co, Kayleen Party, DO  potassium chloride  SA (KLOR-CON  M) 20 MEQ tablet Take 1 tablet (20 mEq total) by mouth daily for 3 days. 10/11/23 10/14/23  Collis Deaner, MD  REPATHA  SURECLICK 140 MG/ML SOAJ Inject 140 mg into the skin every 14  (fourteen) days. 09/15/23   Karamalegos, Kayleen Party, DO  tiZANidine  (ZANAFLEX ) 4 MG tablet TAKE ONE TABLET EVERY 8 HOURS AS NEEDED FOR MUSCLE SPASM. 07/06/23   Raina Bunting, DO    Physical Exam: HEENT: moist mucus membranes,  Respiratory system: lungs clear mildly diminished throughout Cardiovascular system: normal S1/S2, RRR, no edema Gastrointestinal system: soft, NT, ND. Central nervous system: normal speech, grossly non-focal Skin: dry, intact, normal temperature Psychiatry: normal mood and affect    Vitals:   10/13/23 1450 10/13/23 1500 10/13/23 1515 10/13/23 1540  BP:  (!) 145/73 (!) 140/73 136/77  Pulse: 91 80 76 77  Resp: 15 11 13 17   Temp:   98.9 F (37.2 C) 98.3 F (36.8 C)  TempSrc:    Oral  SpO2: 100% 98% 100% 97%  Weight:      Height:        Data Reviewed: I have reviewed MRCP report I have also reviewed abdominal ultrasound as well as below mentioned labs    Latest Ref Rng & Units 10/12/2023   11:19 PM 10/10/2023    2:46 PM 06/07/2023  10:21 AM  CBC  WBC 4.0 - 10.5 K/uL 9.7  5.5  10.8   Hemoglobin 12.0 - 15.0 g/dL 16.1  09.6  04.5   Hematocrit 36.0 - 46.0 % 44.3  41.7  40.0   Platelets 150 - 400 K/uL 220  236  305        Latest Ref Rng & Units 10/12/2023   11:19 PM 10/10/2023    2:46 PM 09/23/2023    7:59 AM  BMP  Glucose 70 - 99 mg/dL 409  73  73   BUN 6 - 20 mg/dL 5  7  7    Creatinine 0.44 - 1.00 mg/dL 8.11  9.14  7.82   BUN/Creat Ratio 6 - 22 (calc)   SEE NOTE:   Sodium 135 - 145 mmol/L 141  140  137   Potassium 3.5 - 5.1 mmol/L 3.3  2.7  3.7   Chloride 98 - 111 mmol/L 102  102  95   CO2 22 - 32 mmol/L 25  26  31    Calcium  8.9 - 10.3 mg/dL 8.2  8.0  8.8     Assessment and Plan:  Right upper quadrant abdominal pain secondary to alcoholic hepatitis Patient with radiographic evidence of cholelithiasis Patient has been counseled on alcohol cessation General Surgery on board and planning cholecystectomy We will keep n.p.o. until  after surgery Continue as needed pain medication Continue as needed Zofran   History of DVT several years ago not currently on anticoagulation Continue to monitor  Acute transaminitis in the setting of alcoholic hepatitis MRCP as well as ultrasound of the abdomen reviewed Monitor LFTs closely  Hypokalemia-continue repletion and monitoring Potassium today 3.3  Alcohol abuse-counseled on cessation  Nicotine  abuse-placed on nicotine  patch   GERD Continue PPI therapy   Essential hypertension Monitor blood pressure closely Resume oral antihypertensives when able to take p.o. meds  History of ischemic stroke Resume home medication when no longer n.p.o.   Advance Care Planning:   Code Status: Prior DNI DNR  Consults: General Surgery  Family Communication: None present at bedside  Severity of Illness: The appropriate patient status for this patient is INPATIENT. Inpatient status is judged to be reasonable and necessary in order to provide the required intensity of service to ensure the patient's safety. The patient's presenting symptoms, physical exam findings, and initial radiographic and laboratory data in the context of their chronic comorbidities is felt to place them at high risk for further clinical deterioration. Furthermore, it is not anticipated that the patient will be medically stable for discharge from the hospital within 2 midnights of admission.   * I certify that at the point of admission it is my clinical judgment that the patient will require inpatient hospital care spanning beyond 2 midnights from the point of admission due to high intensity of service, high risk for further deterioration and high frequency of surveillance required.*  Author: Ezzard Holms, MD 10/13/2023 5:13 PM  For on call review [email or web address].

## 2023-10-13 NOTE — ED Provider Notes (Signed)
 [facility] Provider Note    Event Date/Time   First MD Initiated Contact with Patient 10/13/23 [phone]     (approximate)   History   Emesis   HPI  Joanna Garcia is a 48 y.o. female with history of hypertension, prior stroke, subclavian artery stenosis, bilateral carotid artery occlusion on Plavix  who took her last dose of Plavix  Wednesday morning 10/12/2023 who presents to the emergency department with right upper quadrant abdominal pain, vomiting and diarrhea, chills.  Seen here recently for the same and diagnosed with gallstones.  States she has pain and nausea medicine at home that is not providing any relief.   History provided by patient.    Past Medical History:  Diagnosis Date   DVT (deep venous thrombosis) (HCC)    GERD (gastroesophageal reflux disease)    Hypertension    Polycythemia    Polyneuropathy    Stroke [facility])     Past Surgical History:  Procedure Laterality Date   CAROTID ANGIOGRAPHY Bilateral 07/28/2023   Procedure: CAROTID ANGIOGRAPHY;  Surgeon: Celso College, MD;  Location: [facility] INVASIVE CV LAB;  Service: Cardiovascular;  Laterality: Bilateral;   COLONOSCOPY N/A 03/16/2019   Procedure: COLONOSCOPY;  Surgeon: Toledo, Alphonsus Jeans, MD;  Location: [facility] ENDOSCOPY;  Service: Gastroenterology;  Laterality: N/A;   ESOPHAGOGASTRODUODENOSCOPY N/A 03/16/2019   Procedure: ESOPHAGOGASTRODUODENOSCOPY (EGD);  Surgeon: Toledo, Alphonsus Jeans, MD;  Location: [facility] ENDOSCOPY;  Service: Gastroenterology;  Laterality: N/A;   IR ANGIO INTRA EXTRACRAN SEL COM CAROTID INNOMINATE UNI L MOD SED  02/09/2021   IR INTRAVSC STENT CERV CAROTID W/EMB-PROT MOD SED INCL ANGIO  02/09/2021   IR PERCUTANEOUS ART THROMBECTOMY/INFUSION INTRACRANIAL INC DIAG ANGIO  02/09/2021   IR US  GUIDE VASC ACCESS RIGHT  02/09/2021   NO PAST SURGERIES     RADIOLOGY WITH ANESTHESIA N/A 02/09/2021   Procedure: IR WITH ANESTHESIA;  Surgeon: Radiologist, Medication, MD;  Location: [facility] OR;  Service:  Radiology;  Laterality: N/A;   RENAL ANGIOGRAPHY Left 01/24/2023   Procedure: RENAL ANGIOGRAPHY;  Surgeon: Celso College, MD;  Location: [facility] INVASIVE CV LAB;  Service: Cardiovascular;  Laterality: Left;    MEDICATIONS:  Prior to Admission medications   Medication Sig Start Date End Date Taking? Authorizing Provider  amLODipine  (NORVASC ) 5 MG tablet Take 1 tablet (5 mg total) by mouth daily. 08/25/23   Karamalegos, Kayleen Party, DO  aspirin  81 MG chewable tablet Chew and swallow 1 tablet (81 mg total) by mouth daily. 02/12/21   Rinehuls, Helga Loan, PA-C  atorvastatin  (LIPITOR ) 80 MG tablet Take 1 tablet (80 mg total) by mouth daily. 07/27/23   Karamalegos, Kayleen Party, DO  clopidogrel  (PLAVIX ) 75 MG tablet Take 1 tablet (75 mg total) by mouth daily. 09/30/23   Karamalegos, Kayleen Party, DO  cyanocobalamin  (VITAMIN B12) 1000 MCG/ML injection Inject 1 mL (1,000 mcg total) into the muscle every 30 (thirty) days. For 4 months 07/06/23   Raina Bunting, DO  gabapentin  (NEURONTIN ) 300 MG capsule Take 3 caps = 900mg  dose in morning and afternoon and take 4 caps = 1200mg  in evening. Max dose is 10 capsules in 24 hours. 08/09/23   Karamalegos, Kayleen Party, DO  latanoprost (XALATAN) 0.005 % ophthalmic solution SMARTSIG:In Eye(s) 08/17/23   [provider]  losartan  (COZAAR ) 25 MG tablet Take 1 tablet (25 mg total) by mouth daily. 04/20/23 09/05/24  Ava Boatman, NP  meloxicam  (MOBIC ) 15 MG tablet TAKE ONE TABLET DAILY AS NEEDED. 02/01/23   Domingo Friend  J, DO  ondansetron  (ZOFRAN -ODT) 4 MG disintegrating tablet TAKE 1 TABLET EVERY 8 HOURS AS NEEDED FOR NAUSEA AND VOMITING. 10/12/23   Romeo Co, Kayleen Party, DO  oxyCODONE -acetaminophen  (PERCOCET/ROXICET) 5-325 MG tablet Take 1 tablet by mouth every 4 (four) hours as needed for severe pain (pain score 7-10). Max 4 per day 09/15/23   Raina Bunting, DO  pantoprazole  (PROTONIX ) 40 MG tablet Take 1 tablet (40 mg total) by mouth daily  before breakfast. 11/18/22   Romeo Co, Kayleen Party, DO  potassium chloride  SA (KLOR-CON  M) 20 MEQ tablet Take 1 tablet (20 mEq total) by mouth daily for 3 days. 10/11/23 10/14/23  Collis Deaner, MD  REPATHA  SURECLICK 140 MG/ML SOAJ Inject 140 mg into the skin every 14 (fourteen) days. 09/15/23   Karamalegos, Kayleen Party, DO  tiZANidine  (ZANAFLEX ) 4 MG tablet TAKE ONE TABLET EVERY 8 HOURS AS NEEDED FOR MUSCLE SPASM. 07/06/23   Raina Bunting, DO    Physical Exam   Triage Vital Signs: ED Triage Vitals  Encounter Vitals Group     BP 10/12/23 2316 (!) 173/108     Systolic BP Percentile --      Diastolic BP Percentile --      Pulse Rate 10/12/23 2316 (!) 120     Resp 10/12/23 2316 18     Temp 10/12/23 2316 98 F (36.7 C)     Temp Source 10/12/23 2316 Oral     SpO2 10/12/23 2316 95 %     Weight 10/12/23 2316 117 lb (53.1 kg)     Height 10/12/23 2316 5\' 2"  (1.575 m)     Head Circumference --      Peak Flow --      Pain Score 10/12/23 2315 8     Pain Loc --      Pain Education --      Exclude from Growth Chart --     Most recent vital signs: Vitals:   10/13/23 0109 10/13/23 0253  BP: (!) 157/82   Pulse: 89   Resp: 17   Temp: 97.9 F (36.6 C) 99 F (37.2 C)  SpO2: 95%     CONSTITUTIONAL: Alert, responds appropriately to questions.  Appears uncomfortable HEAD: Normocephalic, atraumatic EYES: Conjunctivae clear, pupils appear equal, sclera nonicteric ENT: normal nose; moist mucous membranes NECK: Supple, normal ROM CARD: Regular and tachycardic; S1 and S2 appreciated RESP: Normal chest excursion without splinting or tachypnea; breath sounds clear and equal bilaterally; no wheezes, no rhonchi, no rales, no hypoxia or respiratory distress, speaking full sentences ABD/GI: Non-distended; soft, tender in the right upper quadrant with positive Murphy sign, no tenderness at McBurney's point BACK: The back appears normal EXT: Normal ROM in all joints; no deformity noted, no  edema SKIN: Normal color for age and race; warm; no rash on exposed skin NEURO: Moves all extremities equally, normal speech PSYCH: The patient's mood and manner are appropriate.   ED Results / Procedures / Treatments   LABS: (all labs ordered are listed, but only abnormal results are displayed) Labs Reviewed  COMPREHENSIVE METABOLIC PANEL WITH GFR - Abnormal; Notable for the following components:      Result Value   Potassium 3.3 (*)    Glucose, Bld 109 (*)    BUN <5 (*)    Calcium  8.2 (*)    Total Protein 6.1 (*)    Albumin 3.4 (*)    AST 151 (*)    ALT 89 (*)    Alkaline Phosphatase 196 (*)  All other components within normal limits  CBC - Abnormal; Notable for the following components:   Hemoglobin 15.6 (*)    MCV 108.6 (*)    MCH 38.2 (*)    All other components within normal limits  URINALYSIS, ROUTINE W REFLEX MICROSCOPIC - Abnormal; Notable for the following components:   Color, Urine YELLOW (*)    APPearance CLEAR (*)    All other components within normal limits  LIPASE, BLOOD     EKG:  EKG Interpretation Date/Time:    Ventricular Rate:    PR Interval:    QRS Duration:    QT Interval:    QTC Calculation:   R Axis:      Text Interpretation:           RADIOLOGY: My personal review and interpretation of imaging: Right upper quadrant abdominal ultrasound shows cholelithiasis without cholecystitis.  I have personally reviewed all radiology reports.   US  ABDOMEN LIMITED RUQ (LIVER/GB) Result Date: 10/13/2023 CLINICAL DATA:  Gallstones. EXAM: ULTRASOUND ABDOMEN LIMITED RIGHT UPPER QUADRANT COMPARISON:  October 10, 2023 FINDINGS: Gallbladder: Echogenic gallstones (the largest measures 8 mm) and a small amount of sludge are seen within the dependent portion of a mildly distended gallbladder. This is seen on the prior study. The gallbladder wall measures 3.9 mm in thickness. No sonographic Murphy sign noted by sonographer. Common bile duct: Diameter: 4.2 mm  Liver: No focal lesion identified. The liver borders are mildly lobulated in appearance. Within normal limits in parenchymal echogenicity. Portal vein is patent on color Doppler imaging with normal direction of blood flow towards the liver. Other: None. IMPRESSION: Cholelithiasis and gallbladder sludge without evidence of acute cholecystitis. Electronically Signed   By: Virgle Grime M.D.   On: 10/13/2023 02:34     PROCEDURES:  Critical Care performed: No      Procedures    IMPRESSION / MDM / ASSESSMENT AND PLAN / ED COURSE  I reviewed the triage vital signs and the nursing notes.    Patient here with complaints of right upper and abdominal pain, vomiting or diarrhea.    DIFFERENTIAL DIAGNOSIS (includes but not limited to):   Symptomatic cholelithiasis, cholecystitis, cholangitis, choledocholithiasis, pancreatitis, GERD, gastritis, viral illness   Patient's presentation is most consistent with acute presentation with potential threat to life or bodily function.   PLAN: Workup initiated from triage.  No leukocytosis.  AST, ALT and alkaline phosphatase elevated which was seen also 2 days ago.  Normal total bilirubin.  Will obtain right upper quad ultrasound, urine.  Will give IV fluids, pain and nausea medicine.   MEDICATIONS GIVEN IN ED: Medications  0.9 %  sodium chloride  infusion ( Intravenous New Bag/Given 10/13/23 0346)  nicotine  (NICODERM CQ  - dosed in mg/24 hours) patch 21 mg (21 mg Transdermal Patch Applied 10/13/23 0349)  acetaminophen  (TYLENOL ) tablet 650 mg (has no administration in time range)    Or  acetaminophen  (TYLENOL ) suppository 650 mg (has no administration in time range)  morphine  (PF) 2 MG/ML injection 2 mg (has no administration in time range)  ondansetron  (ZOFRAN ) tablet 4 mg (has no administration in time range)    Or  ondansetron  (ZOFRAN ) injection 4 mg (has no administration in time range)  sodium chloride  0.9 % bolus 1,000 mL (0 mLs Intravenous  Stopped 10/13/23 0242)  morphine  (PF) 4 MG/ML injection 4 mg (4 mg Intravenous Given 10/13/23 0139)  ondansetron  (ZOFRAN ) injection 4 mg (4 mg Intravenous Given 10/13/23 0138)  HYDROmorphone  (DILAUDID ) injection 1 mg (1 mg  Intravenous Given 10/13/23 0343)  ondansetron  (ZOFRAN ) injection 4 mg (4 mg Intravenous Given 10/13/23 0342)     ED COURSE: Ultrasound reviewed and interpreted by myself and radiologist and shows cholelithiasis without cholecystitis, choledocholithiasis.  Patient's symptoms poorly controlled here in the ED.  Have offered admission for evaluation for symptomatic cholelithiasis versus discharge home with additional medications and outpatient surgical follow-up.  She does not feel comfortable with the plan to be discharged home.  Will discuss with general surgery and give additional pain medication, nausea medication, IV fluids and continue to keep her NPO.   CONSULTS: Discussed with Dr. Charmel Cooter general surgery.  He will see patient in consultation.  Recommends obtaining MRCP given elevated liver function test.  Recommends admission to the hospitalist service.  If patient has choledocholithiasis she will need to be seen by GI tomorrow.   Consulted and discussed patient's case with hospitalist, Dr. Vallarie Gauze.  I have recommended admission and consulting physician agrees and will place admission orders.  Patient (and family if present) agree with this plan.   I reviewed all nursing notes, vitals, pertinent previous records.  All labs, EKGs, imaging ordered have been independently reviewed and interpreted by myself.    OUTSIDE RECORDS REVIEWED: Reviewed recent vascular surgery and primary care doctor notes.       FINAL CLINICAL IMPRESSION(S) / ED DIAGNOSES   Final diagnoses:  Nausea vomiting and diarrhea  Symptomatic cholelithiasis  Elevated LFTs     Rx / DC Orders   ED Discharge Orders     None        Note:  This document was prepared using Dragon voice recognition  software and may include unintentional dictation errors.   Jakoby Melendrez, Clover Dao, DO 10/13/23 [phone]

## 2023-10-13 NOTE — Anesthesia Preprocedure Evaluation (Addendum)
 Anesthesia Evaluation  Patient identified by MRN, date of birth, ID band Patient awake    Reviewed: Allergy & Precautions, H&P , NPO status , Patient's Chart, lab work & pertinent test results, reviewed documented beta blocker date and time   Airway Mallampati: II  TM Distance: >3 FB Neck ROM: full    Dental  (+) Teeth Intact   Pulmonary neg pulmonary ROS, Current Smoker and Patient abstained from smoking.   Pulmonary exam normal        Cardiovascular Exercise Tolerance: Poor hypertension, On Medications + Peripheral Vascular Disease  negative cardio ROS Normal cardiovascular exam Rhythm:regular Rate:Normal     Neuro/Psych  Neuromuscular disease CVA negative neurological ROS  negative psych ROS   GI/Hepatic negative GI ROS, Neg liver ROS,GERD  Medicated,,  Endo/Other  negative endocrine ROS    Renal/GU negative Renal ROS  negative genitourinary   Musculoskeletal  (+) Arthritis ,    Abdominal   Peds  Hematology negative hematology ROS (+) Blood dyscrasia, anemia   Anesthesia Other Findings Past Medical History: No date: DVT (deep venous thrombosis) (HCC) No date: GERD (gastroesophageal reflux disease) No date: Hypertension No date: Polycythemia No date: Polyneuropathy No date: Stroke [facility]) Past Surgical History: 07/28/2023: CAROTID ANGIOGRAPHY; Bilateral     Comment:  Procedure: CAROTID ANGIOGRAPHY;  Surgeon: Celso College,               MD;  Location: [facility] INVASIVE CV LAB;  Service:               Cardiovascular;  Laterality: Bilateral; 03/16/2019: COLONOSCOPY; N/A     Comment:  Procedure: COLONOSCOPY;  Surgeon: Toledo, Alphonsus Jeans, MD;              Location: [facility] ENDOSCOPY;  Service: Gastroenterology;                Laterality: N/A; 03/16/2019: ESOPHAGOGASTRODUODENOSCOPY; N/A     Comment:  Procedure: ESOPHAGOGASTRODUODENOSCOPY (EGD);  Surgeon:               Toledo, Alphonsus Jeans, MD;  Location: [facility] ENDOSCOPY;                 Service: Gastroenterology;  Laterality: N/A; 02/09/2021: IR ANGIO INTRA EXTRACRAN SEL COM CAROTID INNOMINATE UNI L  MOD SED 02/09/2021: IR INTRAVSC STENT CERV CAROTID W/EMB-PROT MOD SED INCL  ANGIO 02/09/2021: IR PERCUTANEOUS ART THROMBECTOMY/INFUSION INTRACRANIAL  INC DIAG ANGIO 02/09/2021: IR US  GUIDE VASC ACCESS RIGHT No date: NO PAST SURGERIES 02/09/2021: RADIOLOGY WITH ANESTHESIA; N/A     Comment:  Procedure: IR WITH ANESTHESIA;  Surgeon: Radiologist,               Medication, MD;  Location: [facility] OR;  Service: Radiology;                Laterality: N/A; 01/24/2023: RENAL ANGIOGRAPHY; Left     Comment:  Procedure: RENAL ANGIOGRAPHY;  Surgeon: Celso College,               MD;  Location: [facility] INV[facility]IVE CV LAB;  Service:               Cardiovascular;  Laterality: Left; BMI    Body Mass Index: 21.40 kg/m     Reproductive/Obstetrics negative OB ROS                             Anesthesia Physical Anesthesia Plan  ASA: 3 and emergent  Anesthesia  Plan: General ETT   Post-op Pain Management:    Induction:   PONV Risk Score and Plan: 3  Airway Management Planned:   Additional Equipment:   Intra-op Plan:   Post-operative Plan:   Informed Consent: I have reviewed the patients History and Physical, chart, labs and discussed the procedure including the risks, benefits and alternatives for the proposed anesthesia with the patient or authorized representative who has indicated his/her understanding and acceptance.     Dental Advisory Given  Plan Discussed with: CRNA  Anesthesia Plan Comments:         Anesthesia Quick Evaluation

## 2023-10-13 NOTE — Progress Notes (Signed)
 Patient is interested in Advanced Directive, however states she does not want to talk about it today. Patient prefers to contact staff and make aware when ready to discuss and complete with Chaplain.  Chaplain spiritual support services remain available as the need arises.

## 2023-10-13 NOTE — Transfer of Care (Signed)
 Immediate Anesthesia Transfer of Care Note  Patient: Joanna Garcia  Procedure(s) Performed: CHOLECYSTECTOMY, ROBOT-ASSISTED, LAPAROSCOPIC INDOCYANINE GREEN  FLUORESCENCE IMAGING (ICG)  Patient Location: PACU  Anesthesia Type:General  Level of Consciousness: drowsy and patient cooperative  Airway & Oxygen Therapy: Patient Spontanous Breathing and Patient connected to nasal cannula oxygen  Post-op Assessment: Report given to RN and Post -op Vital signs reviewed and stable  Post vital signs: Reviewed and stable  Last Vitals:  Vitals Value Taken Time  BP 146/77 10/13/23 1445  Temp 36.9 C 10/13/23 1436  Pulse 88 10/13/23 1450  Resp 12 10/13/23 1450  SpO2 100 % 10/13/23 1450  Vitals shown include unfiled device data.  Last Pain:  Vitals:   10/13/23 1436  TempSrc:   PainSc: 0-No pain      Patients Stated Pain Goal: 0 (10/13/23 1252)  Complications: No notable events documented.

## 2023-10-13 NOTE — Anesthesia Procedure Notes (Signed)
 Procedure Name: Intubation Date/Time: 10/13/2023 1:40 PM  Performed by: Izell Marsh, CRNAPre-anesthesia Checklist: Patient identified, Patient being monitored, Timeout performed, Emergency Drugs available and Suction available Patient Re-evaluated:Patient Re-evaluated prior to induction Oxygen Delivery Method: Circle system utilized Preoxygenation: Pre-oxygenation with 100% oxygen Induction Type: IV induction Ventilation: Mask ventilation without difficulty Laryngoscope Size: 3 and McGrath Grade View: Grade I Tube type: Oral Tube size: 6.5 mm Number of attempts: 1 Airway Equipment and Method: Stylet Placement Confirmation: ETT inserted through vocal cords under direct vision, positive ETCO2 and breath sounds checked- equal and bilateral Secured at: 22 cm Tube secured with: Tape Dental Injury: Teeth and Oropharynx as per pre-operative assessment

## 2023-10-13 NOTE — Plan of Care (Signed)
 Alert & oriented x4. S/P lap choly today. Four closed incision sites present with edges well approximated. No redness or drainage noted. No signs of infection noted. No problems with urinary retention. Problem: Education: Goal: Knowledge of General Education information will improve Description: Including pain rating scale, medication(s)/side effects and non-pharmacologic comfort measures Outcome: Progressing   Problem: Health Behavior/Discharge Planning: Goal: Ability to manage health-related needs will improve Outcome: Progressing   Problem: Clinical Measurements: Goal: Ability to maintain clinical measurements within normal limits will improve Outcome: Progressing Goal: Will remain free from infection Outcome: Progressing Goal: Diagnostic test results will improve Outcome: Progressing Goal: Respiratory complications will improve Outcome: Progressing Goal: Cardiovascular complication will be avoided Outcome: Progressing   Problem: Activity: Goal: Risk for activity intolerance will decrease Outcome: Progressing   Problem: Nutrition: Goal: Adequate nutrition will be maintained Outcome: Progressing   Problem: Coping: Goal: Level of anxiety will decrease Outcome: Progressing   Problem: Elimination: Goal: Will not experience complications related to bowel motility Outcome: Progressing Goal: Will not experience complications related to urinary retention Outcome: Progressing   Problem: Pain Managment: Goal: General experience of comfort will improve and/or be controlled Outcome: Progressing   Problem: Safety: Goal: Ability to remain free from injury will improve Outcome: Progressing   Problem: Skin Integrity: Goal: Risk for impaired skin integrity will decrease Outcome: Progressing

## 2023-10-13 NOTE — Consult Note (Signed)
 SURGICAL CONSULTATION NOTE   HISTORY OF PRESENT ILLNESS (HPI):  48 y.o. female presented to [facility] ED for evaluation of nausea and vomiting. Patient reports she has been having nausea and vomiting for at least 5 days.  She endorses that the associated with upper abdominal pain.  Pain mostly radiates to the right side of the abdomen.  Patient cannot identify any alleviating or aggravating factors.  At the ED she was found with tenderness of patient in the right upper quadrant.  Her vital signs are stable without fever.  Labs shows normal white blood cell count of 9.7.  Her CMP shows elevated alkaline phosphatase increasing trend.  3 days ago it was 158 and last night was 196.  Bilirubin also increasing from 1.0-1.2.  She had an ultrasound of the abdomen and pelvis that shows cholelithiasis without sign of cholecystitis.  Specifically normal gallbladder wall thickening and no pericholecystic edema.  I personally evaluated the images.  She actually has 2 ultrasounds done in the last 3 days.  Initial ultrasound on 10/10/2023 with similar finding of cholelithiasis without cholecystitis.  He was done due to transaminitis.  Due to the elevated alkaline phosphatase and slowly increasing bilirubin I recommended an MRCP.  MRCP in process.  Patient with history of stroke and carotid artery occlusion.  Patient currently on Plavix  and aspirin .  Surgery is consulted by Dr. Author Board in this context for evaluation and management of cholelithiasis with  elevated transaminitis.  PAST MEDICAL HISTORY (PMH):  Past Medical History:  Diagnosis Date   DVT (deep venous thrombosis) (HCC)    GERD (gastroesophageal reflux disease)    Hypertension    Polycythemia    Polyneuropathy    Stroke (HCC)      PAST SURGICAL HISTORY (PSH):  Past Surgical History:  Procedure Laterality Date   CAROTID ANGIOGRAPHY Bilateral 07/28/2023   Procedure: CAROTID ANGIOGRAPHY;  Surgeon: Celso College, MD;  Location: [facility] INVASIVE CV LAB;   Service: Cardiovascular;  Laterality: Bilateral;   COLONOSCOPY N/A 03/16/2019   Procedure: COLONOSCOPY;  Surgeon: Toledo, Alphonsus Jeans, MD;  Location: [facility] ENDOSCOPY;  Service: Gastroenterology;  Laterality: N/A;   ESOPHAGOGASTRODUODENOSCOPY N/A 03/16/2019   Procedure: ESOPHAGOGASTRODUODENOSCOPY (EGD);  Surgeon: Toledo, Alphonsus Jeans, MD;  Location: [facility] ENDOSCOPY;  Service: Gastroenterology;  Laterality: N/A;   IR ANGIO INTRA EXTRACRAN SEL COM CAROTID INNOMINATE UNI L MOD SED  02/09/2021   IR INTRAVSC STENT CERV CAROTID W/EMB-PROT MOD SED INCL ANGIO  02/09/2021   IR PERCUTANEOUS ART THROMBECTOMY/INFUSION INTRACRANIAL INC DIAG ANGIO  02/09/2021   IR US  GUIDE VASC ACCESS RIGHT  02/09/2021   NO PAST SURGERIES     RADIOLOGY WITH ANESTHESIA N/A 02/09/2021   Procedure: IR WITH ANESTHESIA;  Surgeon: Radiologist, Medication, MD;  Location: [facility] OR;  Service: Radiology;  Laterality: N/A;   RENAL ANGIOGRAPHY Left 01/24/2023   Procedure: RENAL ANGIOGRAPHY;  Surgeon: Celso College, MD;  Location: [facility] INVASIVE CV LAB;  Service: Cardiovascular;  Laterality: Left;     MEDICATIONS:  Prior to Admission medications   Medication Sig Start Date End Date Taking? Authorizing Provider  amLODipine  (NORVASC ) 5 MG tablet Take 1 tablet (5 mg total) by mouth daily. 08/25/23   Karamalegos, Kayleen Party, DO  aspirin  81 MG chewable tablet Chew and swallow 1 tablet (81 mg total) by mouth daily. 02/12/21   Rinehuls, Helga Loan, PA-C  atorvastatin  (LIPITOR ) 80 MG tablet Take 1 tablet (80 mg total) by mouth daily. 07/27/23   Karamalegos, Kayleen Party, DO  clopidogrel  (PLAVIX ) 75 MG  tablet Take 1 tablet (75 mg total) by mouth daily. 09/30/23   Karamalegos, Kayleen Party, DO  cyanocobalamin  (VITAMIN B12) 1000 MCG/ML injection Inject 1 mL (1,000 mcg total) into the muscle every 30 (thirty) days. For 4 months 07/06/23   Raina Bunting, DO  gabapentin  (NEURONTIN ) 300 MG capsule Take 3 caps = 900mg  dose in morning and afternoon and take 4 caps  = 1200mg  in evening. Max dose is 10 capsules in 24 hours. 08/09/23   Karamalegos, Kayleen Party, DO  latanoprost (XALATAN) 0.005 % ophthalmic solution SMARTSIG:In Eye(s) 08/17/23   [provider]  losartan  (COZAAR ) 25 MG tablet Take 1 tablet (25 mg total) by mouth daily. 04/20/23 09/05/24  Ava Boatman, NP  meloxicam  (MOBIC ) 15 MG tablet TAKE ONE TABLET DAILY AS NEEDED. 02/01/23   Romeo Co, Kayleen Party, DO  ondansetron  (ZOFRAN -ODT) 4 MG disintegrating tablet TAKE 1 TABLET EVERY 8 HOURS AS NEEDED FOR NAUSEA AND VOMITING. 10/12/23   Romeo Co, Kayleen Party, DO  oxyCODONE -acetaminophen  (PERCOCET/ROXICET) 5-325 MG tablet Take 1 tablet by mouth every 4 (four) hours as needed for severe pain (pain score 7-10). Max 4 per day 09/15/23   Raina Bunting, DO  pantoprazole  (PROTONIX ) 40 MG tablet Take 1 tablet (40 mg total) by mouth daily before breakfast. 11/18/22   Romeo Co, Kayleen Party, DO  potassium chloride  SA (KLOR-CON  M) 20 MEQ tablet Take 1 tablet (20 mEq total) by mouth daily for 3 days. 10/11/23 10/14/23  Collis Deaner, MD  REPATHA  SURECLICK 140 MG/ML SOAJ Inject 140 mg into the skin every 14 (fourteen) days. 09/15/23   Karamalegos, Kayleen Party, DO  tiZANidine  (ZANAFLEX ) 4 MG tablet TAKE ONE TABLET EVERY 8 HOURS AS NEEDED FOR MUSCLE SPASM. 07/06/23   Karamalegos, Kayleen Party, DO     ALLERGIES:  No Known Allergies   SOCIAL HISTORY:  Social History   Socioeconomic History   Marital status: Divorced    Spouse name: Not on file   Number of children: Not on file   Years of education: Not on file   Highest education level: 11th grade  Occupational History   Not on file  Tobacco Use   Smoking status: Every Day    Current packs/day: 0.75    Average packs/day: 0.8 packs/day for 31.3 years (23.5 ttl pk-yrs)    Types: Cigarettes    Start date: 06/21/1992   Smokeless tobacco: Never  Vaping Use   Vaping status: Never Used  Substance and Sexual Activity   Alcohol use: Yes     Comment: 24 Oz daily   Drug use: Not Currently    Types: Marijuana   Sexual activity: Not Currently  Other Topics Concern   Not on file  Social History Narrative   Lives at home with mother and 2 dogs Charlie and Clauie    Drives   Social Drivers of Health   Financial Resource Strain: Patient Declined (06/03/2023)   Overall Financial Resource Strain (CARDIA)    Difficulty of Paying Living Expenses: Patient declined  Food Insecurity: No Food Insecurity (10/13/2023)   Hunger Vital Sign    Worried About Running Out of Food in the Last Year: Never true    Ran Out of Food in the Last Year: Never true  Transportation Needs: No Transportation Needs (10/13/2023)   PRAPARE - Administrator, Civil Service (Medical): No    Lack of Transportation (Non-Medical): No  Physical Activity: Unknown (06/03/2023)   Exercise Vital Sign    Days of Exercise per  Week: 5 days    Minutes of Exercise per Session: Patient declined  Stress: No Stress Concern Present (06/03/2023)   [employer] of Occupational Health - Occupational Stress Questionnaire    Feeling of Stress : Not at all  Social Connections: Moderately Isolated (10/13/2023)   Social Connection and Isolation Panel [NHANES]    Frequency of Communication with Friends and Family: Three times a week    Frequency of Social Gatherings with Friends and Family: Three times a week    Attends Religious Services: More than 4 times per year    Active Member of Clubs or Organizations: No    Attends Banker Meetings: Never    Marital Status: Divorced  Catering manager Violence: Not At Risk (10/13/2023)   Humiliation, Afraid, Rape, and Kick questionnaire    Fear of Current or Ex-Partner: No    Emotionally Abused: No    Physically Abused: No    Sexually Abused: No      FAMILY HISTORY:  Family History  Problem Relation Age of Onset   Hypertension Mother    Hyperlipidemia Mother    Heart disease Mother    COPD Mother     Hemochromatosis Mother    Hypertension Father    Hyperlipidemia Father    Cancer Father        prostate   Hemochromatosis Maternal Aunt    Hemochromatosis Maternal Aunt    Hemochromatosis Maternal Uncle    Leukemia Paternal Uncle    Cancer Paternal Grandmother    Breast cancer Paternal Grandmother      REVIEW OF SYSTEMS:  Constitutional: denies weight loss, fever, chills, or sweats  Eyes: denies any other vision changes, history of eye injury  ENT: denies sore throat, hearing problems  Respiratory: denies shortness of breath, wheezing  Cardiovascular: denies chest pain, palpitations  Gastrointestinal: Positive abdominal pain, nausea and vomiting Genitourinary: denies burning with urination or urinary frequency Musculoskeletal: denies any other joint pains or cramps  Skin: denies any other rashes or skin discolorations  Neurological: denies any other headache, dizziness, weakness  Psychiatric: denies any other depression, anxiety   All other review of systems were negative   VITAL SIGNS:  Temp:  [97.9 F (36.6 C)-99 F (37.2 C)] 98.3 F (36.8 C) (04/24 0524) Pulse Rate:  [88-120] 88 (04/24 0524) Resp:  [17-18] 17 (04/24 0109) BP: (157-173)/(78-108) 159/78 (04/24 0524) SpO2:  [95 %-97 %] 97 % (04/24 0524) Weight:  [53 kg-53.1 kg] 53 kg (04/24 0517)     Height: 5\' 2"  (157.5 cm) Weight: 53 kg BMI (Calculated): 21.36   INTAKE/OUTPUT:  This shift: No intake/output data recorded.  Last 2 shifts: @IOLAST2SHIFTS @   PHYSICAL EXAM:  Constitutional:  -- Normal body habitus  -- Awake, alert, and oriented x3  Eyes:  -- Pupils equally round and reactive to light  -- No scleral icterus  Ear, nose, and throat:  -- No jugular venous distension  Pulmonary:  -- No crackles  -- Equal breath sounds bilaterally -- Breathing non-labored at rest Cardiovascular:  -- S1, S2 present  -- No pericardial rubs Gastrointestinal:  -- Abdomen soft, tender to palpation in the right upper  quadrant, non-distended, no guarding or rebound tenderness -- No abdominal masses appreciated, pulsatile or otherwise  Musculoskeletal and Integumentary:  -- Wounds: None appreciated -- Extremities: B/L UE and LE FROM, hands and feet warm, no edema  Neurologic:  -- Motor function: intact and symmetric -- Sensation: intact and symmetric   Labs:  Latest Ref Rng & Units 10/12/2023   11:19 PM 10/10/2023    2:46 PM 06/07/2023   10:21 AM  CBC  WBC 4.0 - 10.5 K/uL 9.7  5.5  10.8   Hemoglobin 12.0 - 15.0 g/dL 45.4  09.8  11.9   Hematocrit 36.0 - 46.0 % 44.3  41.7  40.0   Platelets 150 - 400 K/uL 220  236  305       Latest Ref Rng & Units 10/12/2023   11:19 PM 10/10/2023    2:46 PM 09/23/2023    7:59 AM  CMP  Glucose 70 - 99 mg/dL 147  73  73   BUN 6 - 20 mg/dL 5  7  7    Creatinine 0.44 - 1.00 mg/dL 8.29  5.62  1.30   Sodium 135 - 145 mmol/L 141  140  137   Potassium 3.5 - 5.1 mmol/L 3.3  2.7  3.7   Chloride 98 - 111 mmol/L 102  102  95   CO2 22 - 32 mmol/L 25  26  31    Calcium  8.9 - 10.3 mg/dL 8.2  8.0  8.8   Total Protein 6.5 - 8.1 g/dL 6.1  5.1  5.9   Total Bilirubin 0.0 - 1.2 mg/dL 1.2  1.0  0.7   Alkaline Phos 38 - 126 U/L 196  158    AST 15 - 41 U/L 151  427  32   ALT 0 - 44 U/L 89  89  41     Imaging studies:  EXAM: ULTRASOUND ABDOMEN LIMITED RIGHT UPPER QUADRANT   COMPARISON:  October 10, 2023   FINDINGS: Gallbladder:   Echogenic gallstones (the largest measures 8 mm) and a small amount of sludge are seen within the dependent portion of a mildly distended gallbladder. This is seen on the prior study. The gallbladder wall measures 3.9 mm in thickness. No sonographic Murphy sign noted by sonographer.   Common bile duct:   Diameter: 4.2 mm   Liver:   No focal lesion identified. The liver borders are mildly lobulated in appearance. Within normal limits in parenchymal echogenicity. Portal vein is patent on color Doppler imaging with normal direction of blood  flow towards the liver.   Other: None.   IMPRESSION: Cholelithiasis and gallbladder sludge without evidence of acute cholecystitis.     Electronically Signed   By: Virgle Grime M.D.   On: 10/13/2023 02:34  Assessment/Plan:  48 y.o. female with cholelithiasis with elevated transaminitis, complicated by pertinent comorbidities including history of stroke, carotid occlusion on Plavix  and aspirin , hypertension, smoker.  Cholelithiasis with elevated transaminitis - Clinically patient with cholecystitis with persistent right upper quadrant pain no significant improved with current pain medications - Abdominal ultrasound shows cholelithiasis without significant gallbladder wall thickening - MRCP shows cholelithiasis but no sign of choledocholithiasis - Patient endorses that she has not been able to eat in 5 days.  Even with crackers she is still have significant right upper quadrant pain.  Due to the fact that she has not been able to eat due to the pain I think that is reasonable to proceed with cholecystectomy - Patient understand that she high risk of complication due to her history of stroke.  She can have recurrent stroke from anesthesia.  She also high risk for bleeding due to Plavix  and aspirin .  I think that is safer to proceed with surgery even in Plavix  and aspirin  due to her recent stent placement.  I would not like to hold  the antiplatelet therapy that can increase the risk of stroke. Understanding these high risk patient agreed to proceed with robotic assisted laparoscopic cholecystectomy  Lucila Rye, MD

## 2023-10-14 ENCOUNTER — Encounter: Payer: Self-pay | Admitting: General Surgery

## 2023-10-14 DIAGNOSIS — K8012 Calculus of gallbladder with acute and chronic cholecystitis without obstruction: Secondary | ICD-10-CM | POA: Diagnosis not present

## 2023-10-14 DIAGNOSIS — K8050 Calculus of bile duct without cholangitis or cholecystitis without obstruction: Secondary | ICD-10-CM | POA: Diagnosis not present

## 2023-10-14 LAB — CBC WITH DIFFERENTIAL/PLATELET
Abs Immature Granulocytes: 0.01 10*3/uL (ref 0.00–0.07)
Basophils Absolute: 0 10*3/uL (ref 0.0–0.1)
Basophils Relative: 0 %
Eosinophils Absolute: 0 10*3/uL (ref 0.0–0.5)
Eosinophils Relative: 0 %
HCT: 42.3 % (ref 36.0–46.0)
Hemoglobin: 14.8 g/dL (ref 12.0–15.0)
Immature Granulocytes: 0 %
Lymphocytes Relative: 14 %
Lymphs Abs: 0.7 10*3/uL (ref 0.7–4.0)
MCH: 37.2 pg — ABNORMAL HIGH (ref 26.0–34.0)
MCHC: 35 g/dL (ref 30.0–36.0)
MCV: 106.3 fL — ABNORMAL HIGH (ref 80.0–100.0)
Monocytes Absolute: 0.3 10*3/uL (ref 0.1–1.0)
Monocytes Relative: 6 %
Neutro Abs: 3.6 10*3/uL (ref 1.7–7.7)
Neutrophils Relative %: 80 %
Platelets: 170 10*3/uL (ref 150–400)
RBC: 3.98 MIL/uL (ref 3.87–5.11)
RDW: 12.5 % (ref 11.5–15.5)
WBC: 4.6 10*3/uL (ref 4.0–10.5)
nRBC: 0 % (ref 0.0–0.2)

## 2023-10-14 LAB — COMPREHENSIVE METABOLIC PANEL WITH GFR
ALT: 50 U/L — ABNORMAL HIGH (ref 0–44)
AST: 54 U/L — ABNORMAL HIGH (ref 15–41)
Albumin: 3.1 g/dL — ABNORMAL LOW (ref 3.5–5.0)
Alkaline Phosphatase: 165 U/L — ABNORMAL HIGH (ref 38–126)
Anion gap: 10 (ref 5–15)
BUN: <5 mg/dL — ABNORMAL LOW (ref 6–20)
CO2: 25 mmol/L (ref 22–32)
Calcium: 8 mg/dL — ABNORMAL LOW (ref 8.9–10.3)
Chloride: 104 mmol/L (ref 98–111)
Creatinine, Ser: 0.48 mg/dL (ref 0.44–1.00)
GFR, Estimated: >60 mL/min (ref >60)
Glucose, Bld: 108 mg/dL — ABNORMAL HIGH (ref 70–99)
Potassium: 3.5 mmol/L (ref 3.5–5.1)
Sodium: 139 mmol/L (ref 135–145)
Total Bilirubin: 1 mg/dL (ref 0.0–1.2)
Total Protein: 5.3 g/dL — ABNORMAL LOW (ref 6.5–8.1)

## 2023-10-14 LAB — SURGICAL PATHOLOGY

## 2023-10-14 MED ORDER — HYDROCODONE-ACETAMINOPHEN 5-325 MG PO TABS: 1.0000 | ORAL_TABLET | ORAL | Status: DC | PRN

## 2023-10-14 MED ORDER — HYDROCODONE-ACETAMINOPHEN 5-325 MG PO TABS: 1.0000 | ORAL_TABLET | Freq: Four times a day (QID) | ORAL | 0 refills | Status: AC | PRN

## 2023-10-14 NOTE — Discharge Instructions (Signed)

## 2023-10-14 NOTE — Discharge Summary (Signed)
 Physician Discharge Summary   Patient: Joanna Garcia MRN: [id number] DOB: 1975/08/20  Admit date:     10/13/2023  Discharge date: 10/14/23  Discharge Physician: Ezzard Holms   PCP: Raina Bunting, DO   Recommendations at discharge:  Follow-up with surgery  Discharge Diagnoses:  Right upper quadrant abdominal pain secondary to alcoholic hepatitis Patient with radiographic evidence of cholelithiasis History of DVT several years ago not currently on anticoagulation Acute transaminitis in the setting of alcoholic hepatitis Hypokalemia Alcohol abuse Nicotine  abuse  GERD Essential hypertension History of ischemic stroke  Hospital Course:   Joanna Garcia is a 48 y.o. female with medical history significant of history of DVT several years ago not currently on anticoagulation, GERD, hypertension, previous history of stroke with no residual weakness, patient was otherwise well until the last couple of days when she started experiencing right upper quadrant pain and therefore decided to come to the emergency room for further management.  Pain has no exacerbating factor with intensity of 7/10 relieved by pain medication administered.  Abdominal pain was associated with nausea and vomiting as well as diarrhea.  Diarrhea is however resolved and currently patient is having nausea.  He denies chest pain, cough or urinary complaints. ED course: Temperature 98, respiratory rate 18, pulse 120, blood pressure 173 1/3. Abdominal imaging showing findings of cholelithiasis and subsequently MRCP ordered that did not show any biliary pathology.  In the emergency room general surgery was consulted who agreed for [facility] to admit patient and they will see on consultation.  Patient was taken for laparoscopic cholecystectomy by general surgery on 10/13/2023.  Have been cleared for discharge today and will follow-up as an outpatient.   Consultants: General Surgery Procedures performed: Laparoscopic  cholecystectomy Disposition: Home Diet recommendation:  Cardiac diet DISCHARGE MEDICATION: Allergies as of 10/14/2023   No Known Allergies      Medication List     STOP taking these medications    meloxicam  15 MG tablet Commonly known as: MOBIC    oxyCODONE -acetaminophen  5-325 MG tablet Commonly known as: PERCOCET/ROXICET       TAKE these medications    amLODipine  5 MG tablet Commonly known as: NORVASC  Take 1 tablet (5 mg total) by mouth daily.   aspirin  81 MG chewable tablet Chew and swallow 1 tablet (81 mg total) by mouth daily.   atorvastatin  80 MG tablet Commonly known as: LIPITOR  Take 1 tablet (80 mg total) by mouth daily.   clopidogrel  75 MG tablet Commonly known as: PLAVIX  Take 1 tablet (75 mg total) by mouth daily.   cyanocobalamin  1000 MCG/ML injection Commonly known as: VITAMIN B12 Inject 1 mL (1,000 mcg total) into the muscle every 30 (thirty) days. For 4 months   gabapentin  300 MG capsule Commonly known as: NEURONTIN  Take 3 caps = 900mg  dose in morning and afternoon and take 4 caps = 1200mg  in evening. Max dose is 10 capsules in 24 hours.   HYDROcodone -acetaminophen  5-325 MG tablet Commonly known as: NORCO/VICODIN Take 1 tablet by mouth every 6 (six) hours as needed for up to 3 days.   latanoprost 0.005 % ophthalmic solution Commonly known as: XALATAN SMARTSIG:In Eye(s)   losartan  25 MG tablet Commonly known as: COZAAR  Take 1 tablet (25 mg total) by mouth daily.   ondansetron  4 MG disintegrating tablet Commonly known as: ZOFRAN -ODT TAKE 1 TABLET EVERY 8 HOURS AS NEEDED FOR NAUSEA AND VOMITING.   pantoprazole  40 MG tablet Commonly known as: PROTONIX  Take 1 tablet (40 mg total) by  mouth daily before breakfast.   potassium chloride  SA 20 MEQ tablet Commonly known as: KLOR-CON  M Take 1 tablet (20 mEq total) by mouth daily for 3 days.   Repatha  SureClick 140 MG/ML Soaj Generic drug: Evolocumab  Inject 140 mg into the skin every 14  (fourteen) days.   tiZANidine  4 MG tablet Commonly known as: ZANAFLEX  TAKE ONE TABLET EVERY 8 HOURS AS NEEDED FOR MUSCLE SPASM.        Follow-up Information     Raina Bunting, DO Follow up.   Specialty: Family Medicine Why: Hospital follow up Contact information: [address] [place] [address] [phone]         Eldred Grego, MD Follow up in 2 week(s).   Specialty: General Surgery Why: Follow up after cholecystectomy Contact information: [address] [place] [address] [phone]                Discharge Exam: Filed Weights   10/12/23 2316 10/13/23 0517 10/13/23 1252  Weight: 53.1 kg 53 kg 53.1 kg   HEENT: moist mucus membranes,  Respiratory system: lungs clear mildly diminished throughout Cardiovascular system: normal S1/S2, RRR, no edema Gastrointestinal system: soft, NT, ND. Central nervous system: normal speech, grossly non-focal Skin: dry, intact, normal temperature Psychiatry: normal mood and affect  Condition at discharge: good  The results of significant diagnostics from this hospitalization (including imaging, microbiology, ancillary and laboratory) are listed below for reference.   Imaging Studies: MR ABDOMEN MRCP W WO CONTAST Result Date: 10/13/2023 CLINICAL DATA:  Abdominal pain and emesis for 5 days.  Gallstones. EXAM: MRI ABDOMEN WITHOUT AND WITH CONTRAST (INCLUDING MRCP) TECHNIQUE: Multiplanar multisequence MR imaging of the abdomen was performed both before and after the administration of intravenous contrast. Heavily T2-weighted images of the biliary and pancreatic ducts were obtained, and three-dimensional MRCP images were rendered by post processing. CONTRAST:  5mL GADAVIST  GADOBUTROL  1 MMOL/ML IV SOLN COMPARISON:  U/S abdomen limited 10/13/2023 FINDINGS: Lower chest: No acute findings. Hepatobiliary: Diffuse hepatic steatosis. Small flash fill hemangioma identified within segment 4B measures 5 mm, image  54/2. No suspicious liver abnormalities. The layering stones and sludge noted on the right upper quadrant sonogram are not as well visualized on this exam. No signs gallbladder wall thickening or pericholecystic inflammation. The common bile duct measures 5 mm in maximum diameter. There is no sign of choledocholithiasis. Pancreas: No mass, inflammatory changes, or other parenchymal abnormality identified. Spleen:  Within normal limits in size and appearance. Adrenals/Urinary Tract: Normal adrenal glands. No hydronephrosis. Right kidney appears within normal limits. Posterior right kidney cyst measures 1.3 x 0.7 cm. No internal enhancement identified within this structure. Scarring within the posterolateral cortex of the upper pole of left kidney. Right kidney appears normal. Stomach/Bowel: Visualized portions within the abdomen are unremarkable. Vascular/Lymphatic: Aortic atherosclerosis. No aneurysm. Patent abdominal vascularity. No signs of upper abdominal adenopathy. Other:  No free fluid or fluid collections. Musculoskeletal: No suspicious bone lesions identified. IMPRESSION: 1. No acute findings within the abdomen. 2. Diffuse hepatic steatosis. 3. The layering stones and sludge noted on the right upper quadrant sonogram are not as well visualized on this exam. No signs of gallbladder wall thickening or pericholecystic inflammation. 4. No signs of choledocholithiasis. 5.  Aortic Atherosclerosis (ICD10-I70.0). Electronically Signed   By: Kimberley Penman M.D.   On: 10/13/2023 08:12   MR 3D Recon At Scanner Result Date: 10/13/2023 CLINICAL DATA:  Abdominal pain and emesis for 5 days.  Gallstones. EXAM: MRI ABDOMEN WITHOUT  AND WITH CONTRAST (INCLUDING MRCP) TECHNIQUE: Multiplanar multisequence MR imaging of the abdomen was performed both before and after the administration of intravenous contrast. Heavily T2-weighted images of the biliary and pancreatic ducts were obtained, and three-dimensional MRCP images were  rendered by post processing. CONTRAST:  5mL GADAVIST  GADOBUTROL  1 MMOL/ML IV SOLN COMPARISON:  U/S abdomen limited 10/13/2023 FINDINGS: Lower chest: No acute findings. Hepatobiliary: Diffuse hepatic steatosis. Small flash fill hemangioma identified within segment 4B measures 5 mm, image 54/2. No suspicious liver abnormalities. The layering stones and sludge noted on the right upper quadrant sonogram are not as well visualized on this exam. No signs gallbladder wall thickening or pericholecystic inflammation. The common bile duct measures 5 mm in maximum diameter. There is no sign of choledocholithiasis. Pancreas: No mass, inflammatory changes, or other parenchymal abnormality identified. Spleen:  Within normal limits in size and appearance. Adrenals/Urinary Tract: Normal adrenal glands. No hydronephrosis. Right kidney appears within normal limits. Posterior right kidney cyst measures 1.3 x 0.7 cm. No internal enhancement identified within this structure. Scarring within the posterolateral cortex of the upper pole of left kidney. Right kidney appears normal. Stomach/Bowel: Visualized portions within the abdomen are unremarkable. Vascular/Lymphatic: Aortic atherosclerosis. No aneurysm. Patent abdominal vascularity. No signs of upper abdominal adenopathy. Other:  No free fluid or fluid collections. Musculoskeletal: No suspicious bone lesions identified. IMPRESSION: 1. No acute findings within the abdomen. 2. Diffuse hepatic steatosis. 3. The layering stones and sludge noted on the right upper quadrant sonogram are not as well visualized on this exam. No signs of gallbladder wall thickening or pericholecystic inflammation. 4. No signs of choledocholithiasis. 5.  Aortic Atherosclerosis (ICD10-I70.0). Electronically Signed   By: Kimberley Penman M.D.   On: 10/13/2023 08:12   US  ABDOMEN LIMITED RUQ (LIVER/GB) Result Date: 10/13/2023 CLINICAL DATA:  Gallstones. EXAM: ULTRASOUND ABDOMEN LIMITED RIGHT UPPER QUADRANT  COMPARISON:  October 10, 2023 FINDINGS: Gallbladder: Echogenic gallstones (the largest measures 8 mm) and a small amount of sludge are seen within the dependent portion of a mildly distended gallbladder. This is seen on the prior study. The gallbladder wall measures 3.9 mm in thickness. No sonographic Murphy sign noted by sonographer. Common bile duct: Diameter: 4.2 mm Liver: No focal lesion identified. The liver borders are mildly lobulated in appearance. Within normal limits in parenchymal echogenicity. Portal vein is patent on color Doppler imaging with normal direction of blood flow towards the liver. Other: None. IMPRESSION: Cholelithiasis and gallbladder sludge without evidence of acute cholecystitis. Electronically Signed   By: Virgle Grime M.D.   On: 10/13/2023 02:34   US  ABDOMEN LIMITED RUQ (LIVER/GB) Result Date: 10/10/2023 CLINICAL DATA:  Transaminitis EXAM: ULTRASOUND ABDOMEN LIMITED RIGHT UPPER QUADRANT COMPARISON:  None Available. FINDINGS: Gallbladder: Layering stones and sludge in the gallbladder lumen. No gallbladder wall thickening or pericholecystic fluid. Negative sonographic Murphy's sign. Common bile duct: Diameter: 3.9 mm Liver: No focal lesion identified. Within normal limits in parenchymal echogenicity. Portal vein is patent on color Doppler imaging with normal direction of blood flow towards the liver. Other: None. IMPRESSION: Cholelithiasis without secondary signs of acute cholecystitis. Electronically Signed   By: Jone Neither M.D.   On: 10/10/2023 21:36   CT HEAD WO CONTRAST Result Date: 10/10/2023 CLINICAL DATA:  Pressure behind the right eye. EXAM: CT HEAD WITHOUT CONTRAST TECHNIQUE: Contiguous axial images were obtained from the base of the skull through the vertex without intravenous contrast. RADIATION DOSE REDUCTION: This exam was performed according to the departmental dose-optimization program which includes automated  exposure control, adjustment of the mA and/or kV  according to patient size and/or use of iterative reconstruction technique. COMPARISON:  January 22, 2023 FINDINGS: Brain: No evidence of acute infarction, hemorrhage, hydrocephalus, extra-axial collection or mass lesion/mass effect. Vascular: No hyperdense vessel or unexpected calcification. Skull: Normal. Negative for fracture or focal lesion. Sinuses/Orbits: No acute finding. Other: None. IMPRESSION: No acute intracranial abnormality. Electronically Signed   By: Virgle Grime M.D.   On: 10/10/2023 19:51    Microbiology: Results for orders placed or performed during the hospital encounter of 10/10/23  Resp panel by RT-PCR (RSV, Flu A&B, Covid) Anterior Nasal Swab     Status: None   Collection Time: 10/10/23  9:37 PM   Specimen: Anterior Nasal Swab  Result Value Ref Range Status   SARS Coronavirus 2 by RT PCR NEGATIVE NEGATIVE Final    Comment: (NOTE) SARS-CoV-2 target nucleic acids are NOT DETECTED.  The SARS-CoV-2 RNA is generally detectable in upper respiratory specimens during the acute phase of infection. The lowest concentration of SARS-CoV-2 viral copies this assay can detect is 138 copies/mL. A negative result does not preclude SARS-Cov-2 infection and should not be used as the sole basis for treatment or other patient management decisions. A negative result may occur with  improper specimen collection/handling, submission of specimen other than nasopharyngeal swab, presence of viral mutation(s) within the areas targeted by this assay, and inadequate number of viral copies(<138 copies/mL). A negative result must be combined with clinical observations, patient history, and epidemiological information. The expected result is Negative.  Fact Sheet for Patients:  BloggerCourse.com  Fact Sheet for Healthcare Providers:  SeriousBroker.it  This test is no t yet approved or cleared by the [place]  FDA and  has been authorized  for detection and/or diagnosis of SARS-CoV-2 by FDA under an Emergency Use Authorization (EUA). This EUA will remain  in effect (meaning this test can be used) for the duration of the COVID-19 declaration under Section 564(b)(1) of the Act, 21 U.S.C.section 360bbb-3(b)(1), unless the authorization is terminated  or revoked sooner.       Influenza A by PCR NEGATIVE NEGATIVE Final   Influenza B by PCR NEGATIVE NEGATIVE Final    Comment: (NOTE) The Xpert Xpress SARS-CoV-2/FLU/RSV plus assay is intended as an aid in the diagnosis of influenza from Nasopharyngeal swab specimens and should not be used as a sole basis for treatment. Nasal washings and aspirates are unacceptable for Xpert Xpress SARS-CoV-2/FLU/RSV testing.  Fact Sheet for Patients: BloggerCourse.com  Fact Sheet for Healthcare Providers: SeriousBroker.it  This test is not yet approved or cleared by the [place]  FDA and has been authorized for detection and/or diagnosis of SARS-CoV-2 by FDA under an Emergency Use Authorization (EUA). This EUA will remain in effect (meaning this test can be used) for the duration of the COVID-19 declaration under Section 564(b)(1) of the Act, 21 U.S.C. section 360bbb-3(b)(1), unless the authorization is terminated or revoked.     Resp Syncytial Virus by PCR NEGATIVE NEGATIVE Final    Comment: (NOTE) Fact Sheet for Patients: BloggerCourse.com  Fact Sheet for Healthcare Providers: SeriousBroker.it  This test is not yet approved or cleared by the [place]  FDA and has been authorized for detection and/or diagnosis of SARS-CoV-2 by FDA under an Emergency Use Authorization (EUA). This EUA will remain in effect (meaning this test can be used) for the duration of the COVID-19 declaration under Section 564(b)(1) of the Act, 21 U.S.C. section 360bbb-3(b)(1), unless the authorization is  terminated or  revoked.  Performed at [facility], [address] [place], [place] [address]     Labs: CBC: Recent Labs  Lab 10/10/23 1446 10/12/23 2319 10/14/23 0449  WBC 5.5 9.7 4.6  NEUTROABS 3.4  --  3.6  HGB 14.3 15.6* 14.8  HCT 41.7 44.3 42.3  MCV 109.2* 108.6* 106.3*  PLT 236 220 170   Basic Metabolic Panel: Recent Labs  Lab 10/10/23 1446 10/12/23 2319 10/14/23 0449  NA 140 141 139  K 2.7* 3.3* 3.5  CL 102 102 104  CO2 26 25 25   GLUCOSE 73 109* 108*  BUN 7 <5* <5*  CREATININE 0.62 0.68 0.48  CALCIUM  8.0* 8.2* 8.0*   Liver Function Tests: Recent Labs  Lab 10/10/23 1446 10/12/23 2319 10/14/23 0449  AST 427* 151* 54*  ALT 89* 89* 50*  ALKPHOS 158* 196* 165*  BILITOT 1.0 1.2 1.0  PROT 5.1* 6.1* 5.3*  ALBUMIN 3.0* 3.4* 3.1*   CBG: No results for input(s): "GLUCAP" in the last 168 hours.  Discharge time spent:  36 minutes.  Signed: Ezzard Holms, MD [facility] 10/14/2023

## 2023-10-14 NOTE — Progress Notes (Signed)
 Patient ID: Joanna Garcia, female   DOB: 01/06/1976, 48 y.o.   MRN: [id number]     SURGICAL PROGRESS NOTE   Hospital Day(s): 0.   Interval History: Patient seen and examined, no acute events or new complaints overnight. Patient reports feeling well this morning.  She has some soreness in the incision but otherwise feeling well.  Denies any nausea, vomiting.  She has not been able to eat because the order was not released yesterday.  Vital signs in last 24 hours: [min-max] current  Temp:  [98.2 F (36.8 C)-98.9 F (37.2 C)] 98.2 F (36.8 C) (04/25 0749) Pulse Rate:  [76-104] 78 (04/25 0749) Resp:  [11-19] 18 (04/25 0749) BP: (136-176)/(65-93) 153/89 (04/25 0749) SpO2:  [94 %-100 %] 95 % (04/25 0749) Weight:  [53.1 kg] 53.1 kg (04/24 1252)     Height: 5\' 2"  (157.5 cm) Weight: 53.1 kg BMI (Calculated): 21.39   Physical Exam:  Constitutional: alert, cooperative and no distress  Respiratory: breathing non-labored at rest  Cardiovascular: regular rate and sinus rhythm  Gastrointestinal: soft, non-tender, and non-distended  Labs:     Latest Ref Rng & Units 10/14/2023    4:49 AM 10/12/2023   11:19 PM 10/10/2023    2:46 PM  CBC  WBC 4.0 - 10.5 K/uL 4.6  9.7  5.5   Hemoglobin 12.0 - 15.0 g/dL 40.9  81.1  91.4   Hematocrit 36.0 - 46.0 % 42.3  44.3  41.7   Platelets 150 - 400 K/uL 170  220  236       Latest Ref Rng & Units 10/14/2023    4:49 AM 10/12/2023   11:19 PM 10/10/2023    2:46 PM  CMP  Glucose 70 - 99 mg/dL 782  956  73   BUN 6 - 20 mg/dL <5  <5  7   Creatinine 0.44 - 1.00 mg/dL 2.13  0.86  5.78   Sodium 135 - 145 mmol/L 139  141  140   Potassium 3.5 - 5.1 mmol/L 3.5  3.3  2.7   Chloride 98 - 111 mmol/L 104  102  102   CO2 22 - 32 mmol/L 25  25  26    Calcium  8.9 - 10.3 mg/dL 8.0  8.2  8.0   Total Protein 6.5 - 8.1 g/dL 5.3  6.1  5.1   Total Bilirubin 0.0 - 1.2 mg/dL 1.0  1.2  1.0   Alkaline Phos 38 - 126 U/L 165  196  158   AST 15 - 41 U/L 54  151  427   ALT 0 - 44 U/L 50   89  89     Imaging studies: No new pertinent imaging studies   Assessment/Plan:  48 y.o. female with acute cholecystitis 1 Day Post-Op s/p robotic assisted laparoscopic cholecystectomy.  - Patient recovering well - The pain is under control -AST/ALT and alkaline phosphatase in decreasing trend.  Normal bilirubin. - Unable to eat yesterday because the order was not released provide no issues with nausea - Hopefully she will have breakfast today and if no issues she will be able to be discharged from surgical standpoint - I will see her in my office in 2 weeks  Lucila Rye, MD

## 2023-10-14 NOTE — TOC Transition Note (Signed)
 Transition of Care [facility]) - Discharge Note   Patient Details  Name: Joanna Garcia MRN: [id number] Date of Birth: December 17, 1975  Transition of Care [facility]) CM/SW Contact:  Elsie Halo, RN Phone Number: 10/14/2023, 11:20 AM   Clinical Narrative:    Patient is medically clear to dc to home. No TOC needs identified.   Final next level of care: Home/Self Care Barriers to Discharge: Continued Medical Work up   Patient Goals and CMS Choice            Discharge Placement                       Discharge Plan and Services Additional resources added to the After Visit Summary for                                       Social Drivers of Health (SDOH) Interventions SDOH Screenings   Food Insecurity: No Food Insecurity (10/13/2023)  Housing: Low Risk  (10/13/2023)  Transportation Needs: No Transportation Needs (10/13/2023)  Utilities: Not At Risk (10/13/2023)  Alcohol Screen: Low Risk  (12/06/2022)  Depression (PHQ2-9): Low Risk  (09/15/2023)  Financial Resource Strain: Patient Declined (06/03/2023)  Physical Activity: Unknown (06/03/2023)  Social Connections: Moderately Isolated (10/13/2023)  Stress: No Stress Concern Present (06/03/2023)  Tobacco Use: High Risk (10/13/2023)     Readmission Risk Interventions    02/11/2021   12:38 PM  Readmission Risk Prevention Plan  Post Dischage Appt Complete  Medication Screening Complete  Transportation Screening Complete

## 2023-10-17 NOTE — Anesthesia Postprocedure Evaluation (Signed)
 Anesthesia Post Note  Patient: Joanna Garcia  Procedure(s) Performed: CHOLECYSTECTOMY, ROBOT-ASSISTED, LAPAROSCOPIC INDOCYANINE GREEN  FLUORESCENCE IMAGING (ICG)  Patient location during evaluation: PACU Anesthesia Type: General Level of consciousness: awake and alert Pain management: pain level controlled Vital Signs Assessment: post-procedure vital signs reviewed and stable Respiratory status: spontaneous breathing, nonlabored ventilation, respiratory function stable and patient connected to nasal cannula oxygen Cardiovascular status: blood pressure returned to baseline and stable Postop Assessment: no apparent nausea or vomiting Anesthetic complications: no   No notable events documented.   Last Vitals:  Vitals:   10/14/23 0435 10/14/23 0749  BP: (!) 150/88 (!) 153/89  Pulse: 91 78  Resp: 18 18  Temp: 37.1 C 36.8 C  SpO2: 95% 95%    Last Pain:  Vitals:   10/14/23 0800  TempSrc:   PainSc: 1                  Zula Hitch

## 2023-10-20 ENCOUNTER — Encounter: Payer: Self-pay | Admitting: Family Medicine

## 2023-10-20 ENCOUNTER — Ambulatory Visit (INDEPENDENT_AMBULATORY_CARE_PROVIDER_SITE_OTHER): Admitting: Family Medicine

## 2023-10-20 VITALS — BP 122/76 | HR 88 | Ht 62.0 in | Wt 120.0 lb

## 2023-10-20 DIAGNOSIS — M545 Low back pain, unspecified: Secondary | ICD-10-CM | POA: Diagnosis not present

## 2023-10-20 DIAGNOSIS — M546 Pain in thoracic spine: Secondary | ICD-10-CM

## 2023-10-20 DIAGNOSIS — G8929 Other chronic pain: Secondary | ICD-10-CM

## 2023-10-20 DIAGNOSIS — K8020 Calculus of gallbladder without cholecystitis without obstruction: Secondary | ICD-10-CM | POA: Diagnosis not present

## 2023-10-20 DIAGNOSIS — Z9049 Acquired absence of other specified parts of digestive tract: Secondary | ICD-10-CM | POA: Diagnosis not present

## 2023-10-20 DIAGNOSIS — K811 Chronic cholecystitis: Secondary | ICD-10-CM

## 2023-10-20 MED ORDER — OXYCODONE-ACETAMINOPHEN 5-325 MG PO TABS: 1.0000 | ORAL_TABLET | ORAL | 0 refills | Status: DC | PRN

## 2023-10-20 NOTE — Patient Instructions (Addendum)
 Thank you for coming to the office today.  Upcoming Surgery apt follow up next week 5/8   Please schedule a Follow-up Appointment to: Return if symptoms worsen or fail to improve.  If you have any other questions or concerns, please feel free to call the office or send a message through MyChart. You may also schedule an earlier appointment if necessary.  Additionally, you may be receiving a survey about your experience at our office within a few days to 1 week by e-mail or mail. We value your feedback.  Domingo Friend, DO [facility], [place]

## 2023-10-20 NOTE — Progress Notes (Signed)
 Subjective:    Patient ID: Joanna Garcia, female    DOB: Mar 16, 1976, 48 y.o.   MRN: [id number]  [name] is a 48 y.o. female presenting on 10/20/2023 for S/p Cholecystectomy, gallbladder disease with cholelithiasis   HPI  Discussed the use of AI scribe software for clinical note transcription with the patient, who gave verbal consent to proceed.  History of Present Illness   Joanna Garcia "[name]" is a 48 year old female who presents for a follow-up after gallbladder surgery.  HOSPITAL FOLLOW-UP VISIT  Hospital/Location: [facility] Date of Admission: 10/13/23 Date of Discharge: 10/14/23 Transitions of care telephone call: Not completed  Reason for Admission: Symptomatic Cholelithiasis, Nausea Abdominal Pain  - Hospital H&P and Discharge Summary have been reviewed - Patient presents today 7 days after recent hospitalization.   She was hospitalized last week due to severe right upper abdominal pain, nausea, and vomiting, which prevented her from eating. Abdominal imaging confirmed gallstones, leading to a cholecystectomy. Post-surgery, her symptoms have significantly improved.  Prior to surgery, she experienced persistent nausea and worsening symptoms, prompting an ER visit on April 21st due to a headache and photopsia. During this visit, gallstones were incidentally discovered. Subsequent imaging and evaluation confirmed gallstones and chronic cholecystitis, leading to surgical intervention.  Post-surgery, she has been adhering to a diet avoiding fried, spicy, and greasy foods, consuming only boiled chicken as her meat intake. She feels much better and is managing her diet to prevent gastrointestinal discomfort.  She is currently taking oxycodone  for pain management, one every four hours as needed, with a maximum of four per day. She had a previous prescription for hydrocodone  post-surgery, which was for three days. - She needs her routine Oxycodone  ordered, she has been on pain  management for a period of time now for her back. This is due for refill soon  Her surgical wounds are healing with some tenderness and itching. She has a follow-up appointment with the surgeon next Thursday.   - Today reports overall has done well after discharge. Symptoms of abdominal pain nausea have resolved   - New medications on discharge: Hydrocodone  completed 3 day - Changes to current meds on discharge: none  I have reviewed the discharge medication list, and have reconciled the current and discharge medications today.   Current Outpatient Medications:    amLODipine  (NORVASC ) 5 MG tablet, Take 1 tablet (5 mg total) by mouth daily., Disp: 90 tablet, Rfl: 0   aspirin  81 MG chewable tablet, Chew and swallow 1 tablet (81 mg total) by mouth daily., Disp: 30 tablet, Rfl: 2   atorvastatin  (LIPITOR ) 80 MG tablet, Take 1 tablet (80 mg total) by mouth daily., Disp: 90 tablet, Rfl: 0   clopidogrel  (PLAVIX ) 75 MG tablet, Take 1 tablet (75 mg total) by mouth daily., Disp: 30 tablet, Rfl: 0   cyanocobalamin  (VITAMIN B12) 1000 MCG/ML injection, Inject 1 mL (1,000 mcg total) into the muscle every 30 (thirty) days. For 4 months, Disp: 4 mL, Rfl: 0   gabapentin  (NEURONTIN ) 300 MG capsule, Take 3 caps = 900mg  dose in morning and afternoon and take 4 caps = 1200mg  in evening. Max dose is 10 capsules in 24 hours., Disp: 300 capsule, Rfl: 2   latanoprost (XALATAN) 0.005 % ophthalmic solution, SMARTSIG:In Eye(s), Disp: , Rfl:    losartan  (COZAAR ) 25 MG tablet, Take 1 tablet (25 mg total) by mouth daily., Disp: 90 tablet, Rfl: 3   ondansetron  (ZOFRAN -ODT) 4 MG disintegrating tablet, TAKE 1 TABLET  EVERY 8 HOURS AS NEEDED FOR NAUSEA AND VOMITING., Disp: 30 tablet, Rfl: 0   oxyCODONE -acetaminophen  (PERCOCET/ROXICET) 5-325 MG tablet, Take 1 tablet by mouth every 4 (four) hours as needed for severe pain (pain score 7-10). Max 4 per day, Disp: 120 tablet, Rfl: 0   pantoprazole  (PROTONIX ) 40 MG tablet, Take 1  tablet (40 mg total) by mouth daily before breakfast., Disp: 90 tablet, Rfl: 3   tiZANidine  (ZANAFLEX ) 4 MG tablet, TAKE ONE TABLET EVERY 8 HOURS AS NEEDED FOR MUSCLE SPASM., Disp: 90 tablet, Rfl: 2   potassium chloride  SA (KLOR-CON  M) 20 MEQ tablet, Take 1 tablet (20 mEq total) by mouth daily for 3 days., Disp: 3 tablet, Rfl: 0   REPATHA  SURECLICK 140 MG/ML SOAJ, Inject 140 mg into the skin every 14 (fourteen) days. (Patient not taking: Reported on 10/20/2023), Disp: 2 mL, Rfl: 2  ------------------------------------------------------------------------- Social History   Tobacco Use   Smoking status: Every Day    Current packs/day: 0.75    Average packs/day: 0.7 packs/day for 31.3 years (23.5 ttl pk-yrs)    Types: Cigarettes    Start date: 06/21/1992   Smokeless tobacco: Never  Vaping Use   Vaping status: Never Used  Substance Use Topics   Alcohol use: Yes    Comment: 24 Oz daily   Drug use: Not Currently    Types: Marijuana    Review of Systems Per HPI unless specifically indicated above     Objective:    BP 122/76 (BP Location: Left Arm, Patient Position: Sitting, Cuff Size: Normal)   Pulse 88   Ht 5\' 2"  (1.575 m)   Wt 120 lb (54.4 kg)   SpO2 95%   BMI 21.95 kg/m   Wt Readings from Last 3 Encounters:  10/20/23 120 lb (54.4 kg)  10/13/23 117 lb (53.1 kg)  10/10/23 117 lb (53.1 kg)    Physical Exam Vitals and nursing note reviewed.  Constitutional:      General: She is not in acute distress.    Appearance: Normal appearance. She is well-developed. She is not diaphoretic.     Comments: Well-appearing, comfortable, cooperative  HENT:     Head: Normocephalic and atraumatic.  Eyes:     General:        Right eye: No discharge.        Left eye: No discharge.     Conjunctiva/sclera: Conjunctivae normal.  Cardiovascular:     Rate and Rhythm: Normal rate.  Pulmonary:     Effort: Pulmonary effort is normal.  Skin:    General: Skin is warm and dry.     Findings: Lesion  (lower abdomen x 4 ilaparoscopic ncisional wounds well healed, no separation, has residual dermabond glue on. no erythema no drainage. clean and dry) present. No erythema or rash.  Neurological:     Mental Status: She is alert and oriented to person, place, and time.  Psychiatric:        Mood and Affect: Mood normal.        Behavior: Behavior normal.        Thought Content: Thought content normal.     Comments: Well groomed, good eye contact, normal speech and thoughts     I have personally reviewed the radiology report from 10/13/23 on MRCP.  CLINICAL DATA:  Abdominal pain and emesis for 5 days.  Gallstones.   EXAM: MRI ABDOMEN WITHOUT AND WITH CONTRAST (INCLUDING MRCP)   TECHNIQUE: Multiplanar multisequence MR imaging of the abdomen was performed both before and  after the administration of intravenous contrast. Heavily T2-weighted images of the biliary and pancreatic ducts were obtained, and three-dimensional MRCP images were rendered by post processing.   CONTRAST:  5mL GADAVIST  GADOBUTROL  1 MMOL/ML IV SOLN   COMPARISON:  U/S abdomen limited 10/13/2023   FINDINGS: Lower chest: No acute findings.   Hepatobiliary: Diffuse hepatic steatosis. Small flash fill hemangioma identified within segment 4B measures 5 mm, image 54/2. No suspicious liver abnormalities.   The layering stones and sludge noted on the right upper quadrant sonogram are not as well visualized on this exam. No signs gallbladder wall thickening or pericholecystic inflammation. The common bile duct measures 5 mm in maximum diameter. There is no sign of choledocholithiasis.   Pancreas: No mass, inflammatory changes, or other parenchymal abnormality identified.   Spleen:  Within normal limits in size and appearance.   Adrenals/Urinary Tract: Normal adrenal glands. No hydronephrosis. Right kidney appears within normal limits. Posterior right kidney cyst measures 1.3 x 0.7 cm. No internal enhancement  identified within this structure. Scarring within the posterolateral cortex of the upper pole of left kidney. Right kidney appears normal.   Stomach/Bowel: Visualized portions within the abdomen are unremarkable.   Vascular/Lymphatic: Aortic atherosclerosis. No aneurysm. Patent abdominal vascularity. No signs of upper abdominal adenopathy.   Other:  No free fluid or fluid collections.   Musculoskeletal: No suspicious bone lesions identified.   IMPRESSION: 1. No acute findings within the abdomen. 2. Diffuse hepatic steatosis. 3. The layering stones and sludge noted on the right upper quadrant sonogram are not as well visualized on this exam. No signs of gallbladder wall thickening or pericholecystic inflammation. 4. No signs of choledocholithiasis. 5.  Aortic Atherosclerosis (ICD10-I70.0).     Electronically Signed   By: Kimberley Penman M.D.   On: 10/13/2023 08:12   Results for orders placed or performed during the hospital encounter of 10/13/23  Lipase, blood   Collection Time: 10/12/23 11:19 PM  Result Value Ref Range   Lipase 20 11 - 51 U/L  Comprehensive metabolic panel   Collection Time: 10/12/23 11:19 PM  Result Value Ref Range   Sodium 141 135 - 145 mmol/L   Potassium 3.3 (L) 3.5 - 5.1 mmol/L   Chloride 102 98 - 111 mmol/L   CO2 25 22 - 32 mmol/L   Glucose, Bld 109 (H) 70 - 99 mg/dL   BUN <5 (L) 6 - 20 mg/dL   Creatinine, Ser 1.61 0.44 - 1.00 mg/dL   Calcium  8.2 (L) 8.9 - 10.3 mg/dL   Total Protein 6.1 (L) 6.5 - 8.1 g/dL   Albumin 3.4 (L) 3.5 - 5.0 g/dL   AST 096 (H) 15 - 41 U/L   ALT 89 (H) 0 - 44 U/L   Alkaline Phosphatase 196 (H) 38 - 126 U/L   Total Bilirubin 1.2 0.0 - 1.2 mg/dL   GFR, Estimated >04 >54 mL/min   Anion gap 14 5 - 15  CBC   Collection Time: 10/12/23 11:19 PM  Result Value Ref Range   WBC 9.7 4.0 - 10.5 K/uL   RBC 4.08 3.87 - 5.11 MIL/uL   Hemoglobin 15.6 (H) 12.0 - 15.0 g/dL   HCT 09.8 11.9 - 14.7 %   MCV 108.6 (H) 80.0 - 100.0 fL    MCH 38.2 (H) 26.0 - 34.0 pg   MCHC 35.2 30.0 - 36.0 g/dL   RDW 82.9 56.2 - 13.0 %   Platelets 220 150 - 400 K/uL   nRBC 0.0 0.0 - 0.2 %  Surgical pathology   Collection Time: 10/13/23 12:00 AM  Result Value Ref Range   SURGICAL PATHOLOGY      SURGICAL PATHOLOGY [facility] [address], [address] [place], [place] [address] Telephone [phone] or [phone] Fax [phone]  REPORT OF SURGICAL PATHOLOGY   Accession #: [id number] Patient Name: SHIRLE, STARKES Visit # : [id number]  MRN: [id number] Physician: Eldred Grego DOB/Age 06/23/1975 (Age: 42) Gender: F Collected Date: 10/13/2023 Received Date: 10/13/2023  FINAL DIAGNOSIS       1. Gallbladder,  :       - CHRONIC CHOLECYSTITIS WITH CHOLELITHIASIS.      - NEGATIVE FOR MALIGNANCY.       DATE SIGNED OUT: 10/14/2023 ELECTRONIC SIGNATURE : Brunetta Capes Md, Alexandria Ida , Pathologist, Electronic Signature  MICROSCOPIC DESCRIPTION  CASE COMMENTS STAINS USED IN DIAGNOSIS: H&E    CLINICAL HISTORY  SPECIMEN(S) OBTAINED 1. Gallbladder,  SPECIMEN COMMENTS: SPECIMEN CLINICAL INFORMATION: 1. Acute cholecystitis    Gross Description 1. Size/?Intact: 7.5 x 3.4 cm; intact      Serosal surface: Pink-tan, smooth, and  glistening with a mild amount of adipose      tissue and a mildly roughened hepatic bed.      Mucosa/Wall: Tan-red, velvety mucosa; 0.2-0.4 cm thick wall. Discrete lesions      are not identified.      Contents: Moderate amount of tenacious, green-yellow bile admixed with a 1.3 x      0.5 x 0.3 cm aggregate of black stone fragments (less than 0.1 cm in greatest      dimension).      Cystic duct: 0.1 cm diameter, patent; received with a single white, plastic      clamp; inked black.      Block Summary: Representative sections including the cystic duct margin (en      face) are submitted in 1 block (1A).      Collection time: 10/13/2023 at 1310      Time in formalin: 10/13/2023  at 1502      Cold ischemia time: 112 minutes      AMG 10/13/2023        Report signed out from the following location(s) [place] [address], [place] [address] CLIA #: [id number]  [facility] [address] [place] [address] CLIA  #: [id number]   Urinalysis, Routine w reflex microscopic -Urine, Clean Catch   Collection Time: 10/13/23  2:51 AM  Result Value Ref Range   Color, Urine YELLOW (A) YELLOW   APPearance CLEAR (A) CLEAR   Specific Gravity, Urine 1.011 1.005 - 1.030   pH 7.0 5.0 - 8.0   Glucose, UA NEGATIVE NEGATIVE mg/dL   Hgb urine dipstick NEGATIVE NEGATIVE   Bilirubin Urine NEGATIVE NEGATIVE   Ketones, ur NEGATIVE NEGATIVE mg/dL   Protein, ur NEGATIVE NEGATIVE mg/dL   Nitrite NEGATIVE NEGATIVE   Leukocytes,Ua NEGATIVE NEGATIVE  CBC with Differential/Platelet   Collection Time: 10/14/23  4:49 AM  Result Value Ref Range   WBC 4.6 4.0 - 10.5 K/uL   RBC 3.98 3.87 - 5.11 MIL/uL   Hemoglobin 14.8 12.0 - 15.0 g/dL   HCT 25.4 27.0 - 62.3 %   MCV 106.3 (H) 80.0 - 100.0 fL   MCH 37.2 (H) 26.0 - 34.0 pg   MCHC 35.0 30.0 - 36.0 g/dL   RDW 76.2 83.1 - 51.7 %   Platelets 170 150 - 400 K/uL   nRBC 0.0  0.0 - 0.2 %   Neutrophils Relative % 80 %   Neutro Abs 3.6 1.7 - 7.7 K/uL   Lymphocytes Relative 14 %   Lymphs Abs 0.7 0.7 - 4.0 K/uL   Monocytes Relative 6 %   Monocytes Absolute 0.3 0.1 - 1.0 K/uL   Eosinophils Relative 0 %   Eosinophils Absolute 0.0 0.0 - 0.5 K/uL   Basophils Relative 0 %   Basophils Absolute 0.0 0.0 - 0.1 K/uL   Immature Granulocytes 0 %   Abs Immature Granulocytes 0.01 0.00 - 0.07 K/uL  Comprehensive metabolic panel   Collection Time: 10/14/23  4:49 AM  Result Value Ref Range   Sodium 139 135 - 145 mmol/L   Potassium 3.5 3.5 - 5.1 mmol/L   Chloride 104 98 - 111 mmol/L   CO2 25 22 - 32 mmol/L   Glucose, Bld 108 (H) 70 - 99 mg/dL   BUN <5 (L) 6 - 20 mg/dL   Creatinine, Ser 1.61 0.44  - 1.00 mg/dL   Calcium  8.0 (L) 8.9 - 10.3 mg/dL   Total Protein 5.3 (L) 6.5 - 8.1 g/dL   Albumin 3.1 (L) 3.5 - 5.0 g/dL   AST 54 (H) 15 - 41 U/L   ALT 50 (H) 0 - 44 U/L   Alkaline Phosphatase 165 (H) 38 - 126 U/L   Total Bilirubin 1.0 0.0 - 1.2 mg/dL   GFR, Estimated >09 >60 mL/min   Anion gap 10 5 - 15      Assessment & Plan:   Problem List Items Addressed This Visit     Chronic thoracic back pain (Left) (Chronic)   Relevant Medications   oxyCODONE -acetaminophen  (PERCOCET/ROXICET) 5-325 MG tablet   Other Visit Diagnoses       Symptomatic cholelithiasis    -  Primary     Chronic left-sided low back pain without sciatica       Relevant Medications   oxyCODONE -acetaminophen  (PERCOCET/ROXICET) 5-325 MG tablet     S/P laparoscopic cholecystectomy         Chronic cholecystitis           RUQ Abd Pain / nausea vomiting - resolved Chronic cholecystitis with gallstones S/p laparoscopic robotic cholecystectomy Hospitalization, managed by [facility] Chronic cholecystitis with gallstones confirmed by surgical pathology post-cholecystectomy. No malignancy. Significant symptom improvement post-surgery. - Continue cholecystectomy dietary modifications: avoid fried, fatty, greasy foods, and large meals. - Follow up with surgeon next Thursday for post-operative evaluation. - Refill oxycodone  prescription for back pain management.        Meds ordered this encounter  Medications   oxyCODONE -acetaminophen  (PERCOCET/ROXICET) 5-325 MG tablet    Sig: Take 1 tablet by mouth every 4 (four) hours as needed for severe pain (pain score 7-10). Max 4 per day    Dispense:  120 tablet    Refill:  0    Follow up plan: Return if symptoms worsen or fail to improve.   Domingo Friend, DO [facility] 10/20/2023, 9:12 AM

## 2023-10-25 ENCOUNTER — Other Ambulatory Visit: Payer: Self-pay | Admitting: Family Medicine

## 2023-10-25 DIAGNOSIS — G8929 Other chronic pain: Secondary | ICD-10-CM

## 2023-10-27 NOTE — Telephone Encounter (Signed)
 Requested Prescriptions  Pending Prescriptions Disp Refills   gabapentin  (NEURONTIN ) 300 MG capsule [Pharmacy Med Name: GABAPENTIN  300 MG CAPSULE] 300 capsule 1    Sig: Take 3 caps = 900mg  dose in morning and afternoon and take 4 caps = 1200mg  in evening. Max dose is 10 capsules in 24 hours.     Neurology: Anticonvulsants - gabapentin  Failed - 10/27/2023 11:49 AM      Failed - Valid encounter within last 12 months    Recent Outpatient Visits           1 week ago Symptomatic cholelithiasis   [place], Kayleen Party, DO   1 month ago Subclavian artery stenosis, right [facility])   [place] [facility], Kayleen Party, [place]              Passed - Cr in normal range and within 360 days    Creat  Date Value Ref Range Status  09/23/2023 0.80 0.50 - 0.99 mg/dL Final   Creatinine, Ser  Date Value Ref Range Status  10/14/2023 0.48 0.44 - 1.00 mg/dL Final         Passed - Completed PHQ-2 or PHQ-9 in the last 360 days

## 2023-10-28 ENCOUNTER — Encounter: Payer: Self-pay | Admitting: Family Medicine

## 2023-10-31 ENCOUNTER — Other Ambulatory Visit: Payer: Self-pay | Admitting: Family Medicine

## 2023-10-31 DIAGNOSIS — E785 Hyperlipidemia, unspecified: Secondary | ICD-10-CM

## 2023-10-31 DIAGNOSIS — I6930 Unspecified sequelae of cerebral infarction: Secondary | ICD-10-CM

## 2023-11-02 ENCOUNTER — Ambulatory Visit (INDEPENDENT_AMBULATORY_CARE_PROVIDER_SITE_OTHER): Admitting: Family Medicine

## 2023-11-02 ENCOUNTER — Encounter: Payer: Self-pay | Admitting: Family Medicine

## 2023-11-02 VITALS — BP 110/70 | HR 88 | Ht 62.0 in | Wt 115.4 lb

## 2023-11-02 DIAGNOSIS — R770 Abnormality of albumin: Secondary | ICD-10-CM

## 2023-11-02 DIAGNOSIS — R748 Abnormal levels of other serum enzymes: Secondary | ICD-10-CM | POA: Diagnosis not present

## 2023-11-02 DIAGNOSIS — R600 Localized edema: Secondary | ICD-10-CM

## 2023-11-02 DIAGNOSIS — E785 Hyperlipidemia, unspecified: Secondary | ICD-10-CM

## 2023-11-02 DIAGNOSIS — I6930 Unspecified sequelae of cerebral infarction: Secondary | ICD-10-CM | POA: Diagnosis not present

## 2023-11-02 LAB — COMPREHENSIVE METABOLIC PANEL WITH GFR
AG Ratio: 2 (calc) (ref 1.0–2.5)
ALT: 59 U/L — ABNORMAL HIGH (ref 6–29)
AST: 56 U/L — ABNORMAL HIGH (ref 10–35)
Albumin: 4 g/dL (ref 3.6–5.1)
Alkaline phosphatase (APISO): 221 U/L — ABNORMAL HIGH (ref 31–125)
BUN: 8 mg/dL (ref 7–25)
CO2: 31 mmol/L (ref 20–32)
Calcium: 9.6 mg/dL (ref 8.6–10.2)
Chloride: 103 mmol/L (ref 98–110)
Creat: 0.67 mg/dL (ref 0.50–0.99)
Globulin: 2 g/dL (ref 1.9–3.7)
Glucose, Bld: 76 mg/dL (ref 65–99)
Potassium: 4.7 mmol/L (ref 3.5–5.3)
Sodium: 142 mmol/L (ref 135–146)
Total Bilirubin: 0.5 mg/dL (ref 0.2–1.2)
Total Protein: 6 g/dL — ABNORMAL LOW (ref 6.1–8.1)
eGFR: 109 mL/min/{1.73_m2} (ref >=60)

## 2023-11-02 MED ORDER — FUROSEMIDE 20 MG PO TABS: 20.0000 mg | ORAL_TABLET | Freq: Every day | ORAL | 0 refills | Status: AC | PRN

## 2023-11-02 NOTE — Patient Instructions (Addendum)
 Thank you for coming to the office today.  Likely swelling in legs due to low protein albumin in blood Probably due to recent surgery and effects on body from gallbladder removal  Goal to increase protein intake, ensure boost if possible added.  Use RICE therapy: - R - Rest / relative rest with activity modification avoid overuse of joint - I - Ice packs (make sure you use a towel or sock / something to protect skin) - C - Compression with stockings to apply pressure and reduce swelling allowing more support - E - Elevation - if significant swelling, lift leg above heart level (toes above your nose) to help reduce swelling, most helpful at night after day of being on your feet  Use Furosemide  if swelling again 20mg  daily for 3-7 days as needed.   Please schedule a Follow-up Appointment to: Return if symptoms worsen or fail to improve.  If you have any other questions or concerns, please feel free to call the office or send a message through MyChart. You may also schedule an earlier appointment if necessary.  Additionally, you may be receiving a survey about your experience at our office within a few days to 1 week by e-mail or mail. We value your feedback.  Domingo Friend, DO [facility], [place]

## 2023-11-02 NOTE — Progress Notes (Signed)
 Subjective:    Patient ID: Joanna Garcia, female    DOB: 21-Feb-1976, 48 y.o.   MRN: [id number]  Joanna Garcia is a 48 y.o. female presenting on 11/02/2023 for Edema   HPI  Discussed the use of AI scribe software for clinical note transcription with the patient, who gave verbal consent to proceed.  History of Present Illness   [name] "Joanna Garcia" is a 48 year old female who presents with bilateral lower extremity swelling.  She experienced sudden onset of bilateral lower extremity swelling starting last Wednesday, with edema extending from the mid-foot to the knees. The swelling was described as 'so tight' that her ankle bones were not visible, and the skin appeared tense and shiny. She noted pitting edema, where pressing on the skin left an indentation. There was no associated redness or warmth, but she experienced a sensation of pressure and discomfort.  She took furosemide  20 mg daily from Thursday to Sunday, which resulted in a reduction of the swelling. Additionally, she elevated her legs frequently, which she found beneficial.  Her past medical history includes a recent gallbladder removal three weeks ago. She mentioned that her family has a history of heart failure, but she did not experience symptoms such as shortness of breath or significant fatigue.  She is currently taking Plavix  and atorvastatin  and requested refills for these medications. She also mentioned experiencing a stomach virus recently, which prevented her from working the previous night.          11/02/2023    8:29 AM 10/20/2023    8:47 AM 09/15/2023    8:02 AM  Depression screen PHQ 2/9  Decreased Interest 0 0 0  Down, Depressed, Hopeless 0 0 0  PHQ - 2 Score 0 0 0  Altered sleeping  0 0  Tired, decreased energy  0 0  Change in appetite  0 0  Feeling bad or failure about yourself   0 0  Trouble concentrating  0 0  Moving slowly or fidgety/restless  0 0  Suicidal thoughts  0 0  PHQ-9 Score  0 0        11/02/2023    8:29 AM 10/20/2023    8:47 AM 09/15/2023    8:03 AM 07/06/2023    8:35 AM  GAD 7 : Generalized Anxiety Score  Nervous, Anxious, on Edge 0 0 0 0  Control/stop worrying 0 0 0 0  Worry too much - different things 0 0 0 0  Trouble relaxing 0 0 0 0  Restless 0 0 0 0  Easily annoyed or irritable 0 0 0 0  Afraid - awful might happen 0 0 0 0  Total GAD 7 Score 0 0 0 0    Social History   Tobacco Use   Smoking status: Every Day    Current packs/day: 0.75    Average packs/day: 0.8 packs/day for 31.4 years (23.5 ttl pk-yrs)    Types: Cigarettes    Start date: 06/21/1992   Smokeless tobacco: Never  Vaping Use   Vaping status: Never Used  Substance Use Topics   Alcohol use: Yes    Comment: 24 Oz daily   Drug use: Not Currently    Types: Marijuana    Review of Systems Per HPI unless specifically indicated above     Objective:     BP 110/70 (BP Location: Right Arm, Patient Position: Sitting, Cuff Size: Normal)   Pulse 88   Ht 5\' 2"  (1.575 m)  Wt 115 lb 6 oz (52.3 kg)   SpO2 98%   BMI 21.10 kg/m   Wt Readings from Last 3 Encounters:  11/02/23 115 lb 6 oz (52.3 kg)  10/20/23 120 lb (54.4 kg)  10/13/23 117 lb (53.1 kg)    Physical Exam Vitals and nursing note reviewed.  Constitutional:      General: She is not in acute distress.    Appearance: She is well-developed. She is not diaphoretic.     Comments: Well-appearing, comfortable, cooperative  HENT:     Head: Normocephalic and atraumatic.  Eyes:     General:        Right eye: No discharge.        Left eye: No discharge.     Conjunctiva/sclera: Conjunctivae normal.  Neck:     Thyroid : No thyromegaly.  Cardiovascular:     Rate and Rhythm: Normal rate and regular rhythm.     Heart sounds: Normal heart sounds. No murmur heard. Pulmonary:     Effort: Pulmonary effort is normal. No respiratory distress.     Breath sounds: Normal breath sounds. No wheezing or rales.  Musculoskeletal:         General: Normal range of motion.     Cervical back: Normal range of motion and neck supple.     Right lower leg: No edema (edema has resolved).     Left lower leg: No edema.  Lymphadenopathy:     Cervical: No cervical adenopathy.  Skin:    General: Skin is warm and dry.     Findings: No erythema or rash.  Neurological:     Mental Status: She is alert and oriented to person, place, and time.  Psychiatric:        Behavior: Behavior normal.     Comments: Well groomed, good eye contact, normal speech and thoughts     Results for orders placed or performed during the hospital encounter of 10/13/23  Lipase, blood   Collection Time: 10/12/23 11:19 PM  Result Value Ref Range   Lipase 20 11 - 51 U/L  Comprehensive metabolic panel   Collection Time: 10/12/23 11:19 PM  Result Value Ref Range   Sodium 141 135 - 145 mmol/L   Potassium 3.3 (L) 3.5 - 5.1 mmol/L   Chloride 102 98 - 111 mmol/L   CO2 25 22 - 32 mmol/L   Glucose, Bld 109 (H) 70 - 99 mg/dL   BUN <5 (L) 6 - 20 mg/dL   Creatinine, Ser 0.98 0.44 - 1.00 mg/dL   Calcium  8.2 (L) 8.9 - 10.3 mg/dL   Total Protein 6.1 (L) 6.5 - 8.1 g/dL   Albumin 3.4 (L) 3.5 - 5.0 g/dL   AST 119 (H) 15 - 41 U/L   ALT 89 (H) 0 - 44 U/L   Alkaline Phosphatase 196 (H) 38 - 126 U/L   Total Bilirubin 1.2 0.0 - 1.2 mg/dL   GFR, Estimated >14 >78 mL/min   Anion gap 14 5 - 15  CBC   Collection Time: 10/12/23 11:19 PM  Result Value Ref Range   WBC 9.7 4.0 - 10.5 K/uL   RBC 4.08 3.87 - 5.11 MIL/uL   Hemoglobin 15.6 (H) 12.0 - 15.0 g/dL   HCT 29.5 62.1 - 30.8 %   MCV 108.6 (H) 80.0 - 100.0 fL   MCH 38.2 (H) 26.0 - 34.0 pg   MCHC 35.2 30.0 - 36.0 g/dL   RDW 65.7 84.6 - 96.2 %   Platelets 220 150 -  400 K/uL   nRBC 0.0 0.0 - 0.2 %  Surgical pathology   Collection Time: 10/13/23 12:00 AM  Result Value Ref Range   SURGICAL PATHOLOGY      SURGICAL PATHOLOGY [facility] [address], [address] [place], [place] [address] Telephone [phone] or [phone] Fax [phone]  REPORT OF SURGICAL PATHOLOGY   Accession #: [id number] Patient Name: Joanna Garcia Visit # : [id number]  MRN: [id number] Physician: Eldred Grego DOB/Age 07/31/1975 (Age: 10) Gender: F Collected Date: 10/13/2023 Received Date: 10/13/2023  FINAL DIAGNOSIS       1. Gallbladder,  :       - CHRONIC CHOLECYSTITIS WITH CHOLELITHIASIS.      - NEGATIVE FOR MALIGNANCY.       DATE SIGNED OUT: 10/14/2023 ELECTRONIC SIGNATURE : Brunetta Capes Md, Alexandria Ida , Pathologist, Electronic Signature  MICROSCOPIC DESCRIPTION  CASE COMMENTS STAINS USED IN DIAGNOSIS: H&E    CLINICAL HISTORY  SPECIMEN(S) OBTAINED 1. Gallbladder,  SPECIMEN COMMENTS: SPECIMEN CLINICAL INFORMATION: 1. Acute cholecystitis    Gross Description 1. Size/?Intact: 7.5 x 3.4 cm; intact      Serosal surface: Pink-tan, smooth, and  glistening with a mild amount of adipose      tissue and a mildly roughened hepatic bed.      Mucosa/Wall: Tan-red, velvety mucosa; 0.2-0.4 cm thick wall. Discrete lesions      are not identified.      Contents: Moderate amount of tenacious, green-yellow bile admixed with a 1.3 x      0.5 x 0.3 cm aggregate of black stone fragments (less than 0.1 cm in greatest      dimension).      Cystic duct: 0.1 cm diameter, patent; received with a single white, plastic      clamp; inked black.      Block Summary: Representative sections including the cystic duct margin (en      face) are submitted in 1 block (1A).      Collection time: 10/13/2023 at 1310      Time in formalin: 10/13/2023 at 1502      Cold ischemia time: 112 minutes      AM[facility] 10/13/2023        Report signed out from the following location(s) [facility] [address] [place], [place] [address] CLIA #: [id number]  [facility] [address] [place], [place] [address] CLIA  #: [id number]   Urinalysis, Routine w reflex microscopic  -Urine, Clean Catch   Collection Time: 10/13/23  2:51 AM  Result Value Ref Range   Color, Urine YELLOW (A) YELLOW   APPearance CLEAR (A) CLEAR   Specific Gravity, Urine 1.011 1.005 - 1.030   pH 7.0 5.0 - 8.0   Glucose, UA NEGATIVE NEGATIVE mg/dL   Hgb urine dipstick NEGATIVE NEGATIVE   Bilirubin Urine NEGATIVE NEGATIVE   Ketones, ur NEGATIVE NEGATIVE mg/dL   Protein, ur NEGATIVE NEGATIVE mg/dL   Nitrite NEGATIVE NEGATIVE   Leukocytes,Ua NEGATIVE NEGATIVE  CBC with Differential/Platelet   Collection Time: 10/14/23  4:49 AM  Result Value Ref Range   WBC 4.6 4.0 - 10.5 K/uL   RBC 3.98 3.87 - 5.11 MIL/uL   Hemoglobin 14.8 12.0 - 15.0 g/dL   HCT 09.3 23.5 - 57.3 %   MCV 106.3 (H) 80.0 - 100.0 fL   MCH 37.2 (H) 26.0 - 34.0 pg   MCHC 35.0 30.0 - 36.0 g/dL   RDW 22.0 25.4 - 27.0 %  Platelets 170 150 - 400 K/uL   nRBC 0.0 0.0 - 0.2 %   Neutrophils Relative % 80 %   Neutro Abs 3.6 1.7 - 7.7 K/uL   Lymphocytes Relative 14 %   Lymphs Abs 0.7 0.7 - 4.0 K/uL   Monocytes Relative 6 %   Monocytes Absolute 0.3 0.1 - 1.0 K/uL   Eosinophils Relative 0 %   Eosinophils Absolute 0.0 0.0 - 0.5 K/uL   Basophils Relative 0 %   Basophils Absolute 0.0 0.0 - 0.1 K/uL   Immature Granulocytes 0 %   Abs Immature Granulocytes 0.01 0.00 - 0.07 K/uL  Comprehensive metabolic panel   Collection Time: 10/14/23  4:49 AM  Result Value Ref Range   Sodium 139 135 - 145 mmol/L   Potassium 3.5 3.5 - 5.1 mmol/L   Chloride 104 98 - 111 mmol/L   CO2 25 22 - 32 mmol/L   Glucose, Bld 108 (H) 70 - 99 mg/dL   BUN <5 (L) 6 - 20 mg/dL   Creatinine, Ser 5.78 0.44 - 1.00 mg/dL   Calcium  8.0 (L) 8.9 - 10.3 mg/dL   Total Protein 5.3 (L) 6.5 - 8.1 g/dL   Albumin 3.1 (L) 3.5 - 5.0 g/dL   AST 54 (H) 15 - 41 U/L   ALT 50 (H) 0 - 44 U/L   Alkaline Phosphatase 165 (H) 38 - 126 U/L   Total Bilirubin 1.0 0.0 - 1.2 mg/dL   GFR, Estimated >46 >96 mL/min   Anion gap 10 5 - 15      Assessment & Plan:   Problem List Items  Addressed This Visit     Dyslipidemia   Other Visit Diagnoses       Bilateral edema of lower extremity    -  Primary   Relevant Medications   furosemide  (LASIX ) 20 MG tablet   Other Relevant Orders   Comprehensive metabolic panel with GFR     Low serum albumin       Relevant Orders   Comprehensive metabolic panel with GFR     Elevated liver enzymes       Relevant Orders   Comprehensive metabolic panel with GFR     History of cerebrovascular accident (CVA) with residual deficit            Bilateral lower extremity edema Recent 1 week ago. Acute edema resolved with furosemide  and elevation. Likely due to hypoalbuminemia and electrolyte imbalance post-gallbladder surgery. Likely component of venous insufficiency as well Other etiology Liver, Heart failure and kidney dysfunction unlikely. Given lab work and prior testing and history. Today no swelling, the issue has resolved - Order blood chemistry panel for protein levels and electrolytes. - Prescribe furosemide  20 mg daily for 3 to 7 days as needed. - RICE therapy, Advise leg elevation to reduce swelling. - Provide 30-day supply of furosemide  for future use.  Hypoalbuminemia Hypoalbuminemia likely secondary to recent surgery. Low albumin causes fluid shift and edema. Liver enzymes improving. Requires correction to prevent edema recurrence. - Increase dietary protein intake, consider Ensure or Boost. - Monitor liver function and protein levels with follow-up tests.  Medication management Refilled clopidogrel  and atorvastatin . Discussed Repatha  for cholesterol management. - Refill clopidogrel  and atorvastatin  for 90 days. - Provide Repatha  sample for trial use. However her cholesterol was too low previously to not meet criteria for it, so we can reconsider in future if she prefers.       Orders Placed This Encounter  Procedures   Comprehensive  metabolic panel with GFR    Meds ordered this encounter  Medications    furosemide  (LASIX ) 20 MG tablet    Sig: Take 1 tablet (20 mg total) by mouth daily as needed.    Dispense:  30 tablet    Refill:  0    Follow up plan: Return if symptoms worsen or fail to improve.   Domingo Friend, DO [facility] 11/02/2023, 8:32 AM

## 2023-11-03 ENCOUNTER — Ambulatory Visit: Payer: Self-pay | Admitting: Family Medicine

## 2023-11-08 ENCOUNTER — Encounter (INDEPENDENT_AMBULATORY_CARE_PROVIDER_SITE_OTHER): Payer: Self-pay

## 2023-11-09 ENCOUNTER — Other Ambulatory Visit: Payer: Self-pay | Admitting: Family Medicine

## 2023-11-09 DIAGNOSIS — K219 Gastro-esophageal reflux disease without esophagitis: Secondary | ICD-10-CM

## 2023-11-09 DIAGNOSIS — M545 Low back pain, unspecified: Secondary | ICD-10-CM

## 2023-11-09 DIAGNOSIS — G8929 Other chronic pain: Secondary | ICD-10-CM

## 2023-11-10 NOTE — Telephone Encounter (Signed)
 Requested medications are due for refill today.  yes  Requested medications are on the active medications list.  yes  Last refill. 07/06/2023 #90 2 rf  Future visit scheduled.   no  Notes to clinic.  Refill not delegated.    Requested Prescriptions  Pending Prescriptions Disp Refills   tiZANidine  (ZANAFLEX ) 4 MG tablet [Pharmacy Med Name: TIZANIDINE  HCL 4 MG TABLET] 90 tablet 0    Sig: TAKE ONE TABLET EVERY 8 HOURS AS NEEDED FOR MUSCLE SPASM.     Not Delegated - Cardiovascular:  Alpha-2 Agonists - tizanidine  Failed - 11/10/2023  4:18 PM      Failed - This refill cannot be delegated      Failed - Valid encounter within last 6 months    Recent Outpatient Visits           1 week ago Bilateral edema of lower extremity   [place] [facility] [place], Kayleen Party, DO   3 weeks ago Symptomatic cholelithiasis   [place] [facility] [place], Kayleen Party, DO   1 month ago Subclavian artery stenosis, right [facility])   [place] [facility] [place], Kayleen Party, DO              Signed Prescriptions Disp Refills   pantoprazole  (PROTONIX ) 40 MG tablet 90 tablet 0    Sig: Take 1 tablet (40 mg total) by mouth daily before breakfast. STOP OMEPRAZOLE  AND START PANTOPRAZOLE  PER MD     Gastroenterology: Proton Pump Inhibitors Failed - 11/10/2023  4:18 PM      Failed - Valid encounter within last 12 months    Recent Outpatient Visits           1 week ago Bilateral edema of lower extremity   [place] [facility] [place], Kayleen Party, DO   3 weeks ago Symptomatic cholelithiasis   [place] [facility] Raina Bunting, DO   1 month ago Subclavian artery stenosis, right [facility])    [facility] [place], Kayleen Party, [place]

## 2023-11-10 NOTE — Telephone Encounter (Signed)
 Requested Prescriptions  Pending Prescriptions Disp Refills   tiZANidine  (ZANAFLEX ) 4 MG tablet [Pharmacy Med Name: TIZANIDINE  HCL 4 MG TABLET] 90 tablet 0    Sig: TAKE ONE TABLET EVERY 8 HOURS AS NEEDED FOR MUSCLE SPASM.     Not Delegated - Cardiovascular:  Alpha-2 Agonists - tizanidine  Failed - 11/10/2023  4:18 PM      Failed - This refill cannot be delegated      Failed - Valid encounter within last 6 months    Recent Outpatient Visits           1 week ago Bilateral edema of lower extremity   [place] [facility] [place], Kayleen Party, DO   3 weeks ago Symptomatic cholelithiasis   [place] [facility] [place], Kayleen Party, DO   1 month ago Subclavian artery stenosis, right [facility])   [place] [facility], Kayleen Party, DO               pantoprazole  (PROTONIX ) 40 MG tablet [Pharmacy Med Name: PANTOPRAZOLE  SOD DR 40 MG TAB] 90 tablet 0    Sig: Take 1 tablet (40 mg total) by mouth daily before breakfast. STOP OMEPRAZOLE  AND START PANTOPRAZOLE  PER MD     Gastroenterology: Proton Pump Inhibitors Failed - 11/10/2023  4:18 PM      Failed - Valid encounter within last 12 months    Recent Outpatient Visits           1 week ago Bilateral edema of lower extremity   [place] [facility] [place], Kayleen Party, DO   3 weeks ago Symptomatic cholelithiasis   [place] [facility] Raina Bunting, DO   1 month ago Subclavian artery stenosis, right [facility])   [place] [facility] [place], Kayleen Party, [place]

## 2023-11-17 ENCOUNTER — Other Ambulatory Visit: Payer: Self-pay | Admitting: Family Medicine

## 2023-11-17 ENCOUNTER — Encounter: Payer: Self-pay | Admitting: Family Medicine

## 2023-11-17 DIAGNOSIS — G8929 Other chronic pain: Secondary | ICD-10-CM

## 2023-11-22 ENCOUNTER — Other Ambulatory Visit: Payer: Self-pay | Admitting: Family Medicine

## 2023-11-22 ENCOUNTER — Inpatient Hospital Stay: Payer: 59 | Admitting: Oncology

## 2023-11-22 ENCOUNTER — Inpatient Hospital Stay: Payer: 59 | Attending: Oncology

## 2023-11-22 ENCOUNTER — Encounter: Payer: Self-pay | Admitting: Oncology

## 2023-11-22 DIAGNOSIS — I10 Essential (primary) hypertension: Secondary | ICD-10-CM

## 2023-11-23 NOTE — Telephone Encounter (Signed)
 Requested Prescriptions  Pending Prescriptions Disp Refills   amLODipine  (NORVASC ) 5 MG tablet [Pharmacy Med Name: AMLODIPINE  BESYLATE 5 MG TAB] 90 tablet 0    Sig: Take 1 tablet (5 mg total) by mouth daily.     Cardiovascular: Calcium  Channel Blockers 2 Failed - 11/23/2023  2:32 PM      Failed - Valid encounter within last 6 months    Recent Outpatient Visits           3 weeks ago Bilateral edema of lower extremity   [place] [facility] [place], Kayleen Party, DO   1 month ago Symptomatic cholelithiasis   [place] [facility] Raina Bunting, DO   2 months ago Subclavian artery stenosis, right [facility])   [place] [facility] [place], Kayleen Party, DO              Passed - Last BP in normal range    BP Readings from Last 1 Encounters:  11/02/23 110/70         Passed - Last Heart Rate in normal range    Pulse Readings from Last 1 Encounters:  11/02/23 88

## 2023-11-30 ENCOUNTER — Other Ambulatory Visit (INDEPENDENT_AMBULATORY_CARE_PROVIDER_SITE_OTHER): Payer: Self-pay | Admitting: Vascular Surgery

## 2023-11-30 DIAGNOSIS — I771 Stricture of artery: Secondary | ICD-10-CM

## 2023-12-02 ENCOUNTER — Ambulatory Visit (INDEPENDENT_AMBULATORY_CARE_PROVIDER_SITE_OTHER): Admitting: Vascular Surgery

## 2023-12-02 ENCOUNTER — Encounter (INDEPENDENT_AMBULATORY_CARE_PROVIDER_SITE_OTHER): Payer: Self-pay | Admitting: Vascular Surgery

## 2023-12-02 ENCOUNTER — Ambulatory Visit (INDEPENDENT_AMBULATORY_CARE_PROVIDER_SITE_OTHER)

## 2023-12-02 VITALS — BP 150/84 | HR 92 | Resp 18 | Ht 62.0 in | Wt 113.0 lb

## 2023-12-02 DIAGNOSIS — I6523 Occlusion and stenosis of bilateral carotid arteries: Secondary | ICD-10-CM

## 2023-12-02 DIAGNOSIS — I701 Atherosclerosis of renal artery: Secondary | ICD-10-CM

## 2023-12-02 DIAGNOSIS — I771 Stricture of artery: Secondary | ICD-10-CM | POA: Diagnosis not present

## 2023-12-02 DIAGNOSIS — I160 Hypertensive urgency: Secondary | ICD-10-CM | POA: Diagnosis not present

## 2023-12-02 DIAGNOSIS — F17200 Nicotine dependence, unspecified, uncomplicated: Secondary | ICD-10-CM

## 2023-12-02 NOTE — Assessment & Plan Note (Signed)
 Duplex today shows monophasic flow beyond the right subclavian artery consistent with a likely stenosis at this location although the velocities are not that elevated. We had a long discussion today regarding how serious the situation is.  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  She is now symptomatic in her right arm and has a right subclavian stenosis.  This is in a very suboptimal location for stent placement due to the vertebral artery, but at some point we may have to place an open Celt stent in this location.  She really needs to stop smoking.  We talked about that at length today.  We discussed that I would much prefer her to stop smoking before we consider any other surgery or intervention to try to improve the durability.  She is going to do her best to try to quit smoking and we will see her back in a few months.

## 2023-12-02 NOTE — Progress Notes (Signed)
 MRN : [id number]  Joanna Garcia is a 48 y.o. (June 20, 1976) female who presents with chief complaint of  Chief Complaint  Patient presents with   Follow-up    3 month follow up Arterial duplex  .  History of Present Illness: Patient returns today in follow up of her subclavian and carotid disease.  She has known bilateral carotid artery occlusions.  She underwent right subclavian artery angioplasty several months ago.  Her right arm is bothering her more than her right arm blood pressure at last check was about 30 points lower than the left.  She is continuing to smoke.  She has cut back but has not been able to entirely quit.  She has the patches at home and she wants to try to quit.  Duplex today shows monophasic flow beyond the right subclavian artery consistent with a likely stenosis at this location although the velocities are not that elevated.  Current Outpatient Medications  Medication Sig Dispense Refill   amLODipine  (NORVASC ) 5 MG tablet Take 1 tablet (5 mg total) by mouth daily. 90 tablet 0   aspirin  81 MG chewable tablet Chew and swallow 1 tablet (81 mg total) by mouth daily. 30 tablet 2   atorvastatin  (LIPITOR ) 80 MG tablet Take 1 tablet (80 mg total) by mouth daily. 90 tablet 3   clopidogrel  (PLAVIX ) 75 MG tablet Take 1 tablet (75 mg total) by mouth daily. 90 tablet 3   cyanocobalamin  (VITAMIN B12) 1000 MCG/ML injection Inject 1 mL (1,000 mcg total) into the muscle every 30 (thirty) days. For 4 months 4 mL 0   furosemide  (LASIX ) 20 MG tablet Take 1 tablet (20 mg total) by mouth daily as needed. 30 tablet 0   gabapentin  (NEURONTIN ) 300 MG capsule Take 3 caps = 900mg  dose in morning and afternoon and take 4 caps = 1200mg  in evening. Max dose is 10 capsules in 24 hours. 900 capsule 1   latanoprost (XALATAN) 0.005 % ophthalmic solution SMARTSIG:In Eye(s)     losartan  (COZAAR ) 25 MG tablet Take 1 tablet (25 mg total) by mouth daily. 90 tablet 3   ondansetron  (ZOFRAN -ODT) 4 MG  disintegrating tablet TAKE 1 TABLET EVERY 8 HOURS AS NEEDED FOR NAUSEA AND VOMITING. 30 tablet 0   oxyCODONE -acetaminophen  (PERCOCET/ROXICET) 5-325 MG tablet Take 1 tablet by mouth every 4 (four) hours as needed for severe pain (pain score 7-10). Max 4 per day 120 tablet 0   pantoprazole  (PROTONIX ) 40 MG tablet Take 1 tablet (40 mg total) by mouth daily before breakfast. STOP OMEPRAZOLE  AND START PANTOPRAZOLE  PER MD 90 tablet 0   potassium chloride  SA (KLOR-CON  M) 20 MEQ tablet Take 1 tablet (20 mEq total) by mouth daily for 3 days. 3 tablet 0   tiZANidine  (ZANAFLEX ) 4 MG tablet TAKE ONE TABLET EVERY 8 HOURS AS NEEDED FOR MUSCLE SPASM. 90 tablet 2   REPATHA  SURECLICK 140 MG/ML SOAJ Inject 140 mg into the skin every 14 (fourteen) days. (Patient not taking: Reported on 11/02/2023) 2 mL 2   No current facility-administered medications for this visit.    Past Medical History:  Diagnosis Date   DVT (deep venous thrombosis) (HCC)    GERD (gastroesophageal reflux disease)    Hypertension    Polycythemia    Polyneuropathy    Stroke [facility])     Past Surgical History:  Procedure Laterality Date   CAROTID ANGIOGRAPHY Bilateral 07/28/2023   Procedure: CAROTID ANGIOGRAPHY;  Surgeon: Celso College, MD;  Location: [facility] INVASIVE CV  LAB;  Service: Cardiovascular;  Laterality: Bilateral;   COLONOSCOPY N/A 03/16/2019   Procedure: COLONOSCOPY;  Surgeon: Toledo, Alphonsus Jeans, MD;  Location: [facility] ENDOSCOPY;  Service: Gastroenterology;  Laterality: N/A;   ESOPHAGOGASTRODUODENOSCOPY N/A 03/16/2019   Procedure: ESOPHAGOGASTRODUODENOSCOPY (EGD);  Surgeon: Toledo, Alphonsus Jeans, MD;  Location: [facility] ENDOSCOPY;  Service: Gastroenterology;  Laterality: N/A;   INDOCYANINE GREEN  FLUORESCENCE IMAGING (ICG)  10/13/2023   Procedure: INDOCYANINE GREEN  FLUORESCENCE IMAGING (ICG);  Surgeon: Eldred Grego, MD;  Location: [facility] ORS;  Service: General;;   IR ANGIO INTRA EXTRACRAN SEL COM CAROTID INNOMINATE UNI L MOD SED  02/09/2021    IR INTRAVSC STENT CERV CAROTID W/EMB-PROT MOD SED INCL ANGIO  02/09/2021   IR PERCUTANEOUS ART THROMBECTOMY/INFUSION INTRACRANIAL INC DIAG ANGIO  02/09/2021   IR US  GUIDE VASC ACCESS RIGHT  02/09/2021   NO PAST SURGERIES     RADIOLOGY WITH ANESTHESIA N/A 02/09/2021   Procedure: IR WITH ANESTHESIA;  Surgeon: Radiologist, Medication, MD;  Location: [facility] OR;  Service: Radiology;  Laterality: N/A;   RENAL ANGIOGRAPHY Left 01/24/2023   Procedure: RENAL ANGIOGRAPHY;  Surgeon: Celso College, MD;  Location: [facility] INVASIVE CV LAB;  Service: Cardiovascular;  Laterality: Left;     Social History   Tobacco Use   Smoking status: Every Day    Current packs/day: 0.75    Average packs/day: 0.8 packs/day for 31.4 years (23.6 ttl pk-yrs)    Types: Cigarettes    Start date: 06/21/1992   Smokeless tobacco: Never  Vaping Use   Vaping status: Never Used  Substance Use Topics   Alcohol use: Yes    Comment: 24 Oz daily   Drug use: Not Currently    Types: Marijuana      Family History  Problem Relation Age of Onset   Hypertension Mother    Hyperlipidemia Mother    Heart disease Mother    COPD Mother    Hemochromatosis Mother    Hypertension Father    Hyperlipidemia Father    Cancer Father        prostate   Hemochromatosis Maternal Aunt    Hemochromatosis Maternal Aunt    Hemochromatosis Maternal Uncle    Leukemia Paternal Uncle    Cancer Paternal Grandmother    Breast cancer Paternal Grandmother      No Known Allergies   REVIEW OF SYSTEMS (Negative unless checked)   Constitutional: [] Weight loss  [] Fever  [] Chills Cardiac: [] Chest pain   [] Chest pressure   [] Palpitations   [] Shortness of breath when laying flat   [] Shortness of breath at rest   [] Shortness of breath with exertion. 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   [] Dysphagia   [] Weakness or numbness in arms   [] Weakness or numbness in legs Musculoskeletal:  [] Arthritis   [] Joint swelling   [] Joint pain   [] Low back pain Hematologic:  [] Easy bruising  [] Easy bleeding   [] Hypercoagulable  state   [] Anemic   Gastrointestinal:  [] Blood in stool   [] Vomiting blood  [x] Gastroesophageal reflux/heartburn   [] Abdominal pain Genitourinary:  [] Chronic kidney disease   [] Difficult urination  [] Frequent urination  [] Burning with urination   [] Hematuria Skin:  [] Rashes   [] Ulcers   [] Wounds Psychological:  [] History of anxiety   []  History of major depression.  Physical Examination  BP (!) 150/84 (BP Location: Left Arm, Patient Position: Sitting, Cuff Size: Normal)   Pulse 92   Resp 18   Ht 5' 2 (1.575 m)   Wt 113 lb (51.3 kg)   BMI 20.67 kg/m  Gen:  WD/WN, NAD. Appears older than stated age. Head: /AT, No temporalis wasting. Ear/Nose/Throat: Hearing grossly intact, nares w/o erythema or drainage Eyes: Conjunctiva clear. Sclera non-icteric Neck: Supple.  Trachea midline Pulmonary:  Good air movement, no use of accessory muscles.  Cardiac: RRR, no JVD Vascular:  Vessel Right Left  Radial 1+ Palpable 2+ Palpable                   Musculoskeletal: M/S 5/5 throughout.  No deformity or atrophy. No edema. Neurologic: Sensation grossly intact in extremities.  Symmetrical.  Speech is fluent.  Psychiatric: Judgment intact, Mood & affect appropriate for pt's clinical situation. Dermatologic: No rashes or ulcers noted.  No cellulitis or open wounds.      Labs Recent Results (from the past 2160 hours)  COMPLETE METABOLIC PANEL WITHOUT  GFR     Status: Abnormal   Collection Time: 09/23/23  7:59 AM  Result Value Ref Range   Glucose, Bld 73 65 - 139 mg/dL    Comment: .        Non-fasting reference interval .    BUN 7 7 - 25 mg/dL   Creat 8.65 7.84 - 6.96 mg/dL   BUN/Creatinine Ratio SEE NOTE: 6 - 22 (calc)    Comment:    Not Reported: BUN and Creatinine are within    reference range. .    Sodium 137 135 - 146 mmol/L   Potassium 3.7 3.5 - 5.3 mmol/L   Chloride 95 (L) 98 - 110 mmol/L   CO2 31 20 - 32 mmol/L   Calcium  8.8 8.6 - 10.2 mg/dL   Total Protein 5.9 (L) 6.1 - 8.1 g/dL   Albumin 3.9 3.6 - 5.1 g/dL   Globulin 2.0 1.9 - 3.7 g/dL (calc)   AG Ratio 2.0 1.0 - 2.5 (calc)   Total Bilirubin 0.7 0.2 - 1.2 mg/dL   Alkaline phosphatase (APISO) 117 31 - 125 U/L   AST 32 10 - 35 U/L   ALT 41 (H) 6 - 29 U/L  Lipid panel     Status: None   Collection Time: 09/23/23  7:59 AM  Result Value Ref Range   Cholesterol 169 <200 mg/dL   HDL 97 > OR = 50 mg/dL   Triglycerides 94 <295 mg/dL   LDL Cholesterol (Calc) 54 mg/dL (calc)    Comment: Reference range: <100 . Desirable range <100 mg/dL for primary prevention;   <70 mg/dL for patients with CHD or diabetic patients  with > or = 2 CHD risk factors. Aaron Aas LDL-C is now calculated using the Martin-Hopkins  calculation, which is a validated novel method providing  better accuracy than the Friedewald equation in the  estimation of LDL-C.  Melinda Sprawls et al. Erroll Heard. 2841;324(40): 2061-2068  (http://education.[employer].com/faq/FAQ164)    Total CHOL/HDL Ratio 1.7 <5.0 (calc)   Non-HDL Cholesterol (Calc) 72 <102 mg/dL (calc)  Comment: For patients with diabetes plus 1 major ASCVD risk  factor, treating to a non-HDL-C goal of <100 mg/dL  (LDL-C of <16 mg/dL) is considered a therapeutic  option.   Protime-INR     Status: None   Collection Time: 10/10/23  2:46 PM  Result Value Ref Range   Prothrombin Time 14.5 11.4 - 15.2 seconds   INR 1.1 0.8 - 1.2    Comment:  (NOTE) INR goal varies based on device and disease states. Performed at [facility], [address] [place], [place] [address]   APTT     Status: None   Collection Time: 10/10/23  2:46 PM  Result Value Ref Range   aPTT 28 24 - 36 seconds    Comment: Performed at [facility], [address] [place], [place] [address]  CBC     Status: Abnormal   Collection Time: 10/10/23  2:46 PM  Result Value Ref Range   WBC 5.5 4.0 - 10.5 K/uL   RBC 3.82 (L) 3.87 - 5.11 MIL/uL   Hemoglobin 14.3 12.0 - 15.0 g/dL   HCT 81.1 91.4 - 78.2 %   MCV 109.2 (H) 80.0 - 100.0 fL   MCH 37.4 (H) 26.0 - 34.0 pg   MCHC 34.3 30.0 - 36.0 g/dL   RDW 95.6 21.3 - 08.6 %   Platelets 236 150 - 400 K/uL   nRBC 0.0 0.0 - 0.2 %    Comment: Performed at [facility], [address] [place], [place] [address]  Differential     Status: None   Collection Time: 10/10/23  2:46 PM  Result Value Ref Range   Neutrophils Relative % 63 %   Neutro Abs 3.4 1.7 - 7.7 K/uL   Lymphocytes Relative 26 %   Lymphs Abs 1.4 0.7 - 4.0 K/uL   Monocytes Relative 9 %   Monocytes Absolute 0.5 0.1 - 1.0 K/uL   Eosinophils Relative 1 %   Eosinophils Absolute 0.1 0.0 - 0.5 K/uL   Basophils Relative 1 %   Basophils Absolute 0.1 0.0 - 0.1 K/uL   Immature Granulocytes 0 %   Abs Immature Granulocytes 0.01 0.00 - 0.07 K/uL    Comment: Performed at [facility], [address] [place], [place] [address]  Comprehensive metabolic panel     Status: Abnormal   Collection Time: 10/10/23  2:46 PM  Result Value Ref Range   Sodium 140 135 - 145 mmol/L   Potassium 2.7 (LL) 3.5 - 5.1 mmol/L    Comment: CRITICAL RESULT CALLED TO, READ BACK BY AND VERIFIED WITH JANE RYAN 10/10/23 @ 1536 BY SH    Chloride 102 98 - 111 mmol/L   CO2 26 22 - 32 mmol/L   Glucose, Bld 73 70 - 99 mg/dL    Comment: Glucose reference range applies only to samples taken after fasting for at least 8 hours.   BUN 7 6 - 20 mg/dL    Creatinine, Ser 2.84 0.44 - 1.00 mg/dL   Calcium  8.0 (L) 8.9 - 10.3 mg/dL   Total Protein 5.1 (L) 6.5 - 8.1 g/dL   Albumin 3.0 (L) 3.5 - 5.0 g/dL   AST 132 (H) 15 - 41 U/L   ALT 89 (H) 0 - 44 U/L   Alkaline Phosphatase 158 (H) 38 - 126 U/L   Total Bilirubin 1.0 0.0 - 1.2 mg/dL   GFR, Estimated >44 >01 mL/min    Comment: (NOTE) Calculated using the CKD-EPI Creatinine Equation (2021)  Anion gap 12 5 - 15    Comment: Performed at [facility], [address] [place], [place] [address]  Sedimentation rate     Status: None   Collection Time: 10/10/23  2:46 PM  Result Value Ref Range   Sed Rate 1 0 - 20 mm/hr    Comment: Performed at [facility], [address] [place], [place] [address]  Resp panel by RT-PCR (RSV, Flu A&B, Covid) Anterior Nasal Swab     Status: None   Collection Time: 10/10/23  9:37 PM   Specimen: Anterior Nasal Swab  Result Value Ref Range   SARS Coronavirus 2 by RT PCR NEGATIVE NEGATIVE    Comment: (NOTE) SARS-CoV-2 target nucleic acids are NOT DETECTED.  The SARS-CoV-2 RNA is generally detectable in upper respiratory specimens during the acute phase of infection. The lowest concentration of SARS-CoV-2 viral copies this assay can detect is 138 copies/mL. A negative result does not preclude SARS-Cov-2 infection and should not be used as the sole basis for treatment or other patient management decisions. A negative result may occur with  improper specimen collection/handling, submission of specimen other than nasopharyngeal swab, presence of viral mutation(s) within the areas targeted by this assay, and inadequate number of viral copies(<138 copies/mL). A negative result must be combined with clinical observations, patient history, and epidemiological information. The expected result is Negative.  Fact Sheet for Patients:  BloggerCourse.com  Fact Sheet for Healthcare Providers:   SeriousBroker.it  This test is no t yet approved or cleared by the [place]  FDA and  has been authorized for detection and/or diagnosis of SARS-CoV-2 by FDA under an Emergency Use Authorization (EUA). This EUA will remain  in effect (meaning this test can be used) for the duration of the COVID-19 declaration under Section 564(b)(1) of the Act, 21 U.S.C.section 360bbb-3(b)(1), unless the authorization is terminated  or revoked sooner.       Influenza A by PCR NEGATIVE NEGATIVE   Influenza B by PCR NEGATIVE NEGATIVE    Comment: (NOTE) The Xpert Xpress SARS-CoV-2/FLU/RSV plus assay is intended as an aid in the diagnosis of influenza from Nasopharyngeal swab specimens and should not be used as a sole basis for treatment. Nasal washings and aspirates are unacceptable for Xpert Xpress SARS-CoV-2/FLU/RSV testing.  Fact Sheet for Patients: BloggerCourse.com  Fact Sheet for Healthcare Providers: SeriousBroker.it  This test is not yet approved or cleared by the [place]  FDA and has been authorized for detection and/or diagnosis of SARS-CoV-2 by FDA under an Emergency Use Authorization (EUA). This EUA will remain in effect (meaning this test can be used) for the duration of the COVID-19 declaration under Section 564(b)(1) of the Act, 21 U.S.C. section 360bbb-3(b)(1), unless the authorization is terminated or revoked.     Resp Syncytial Virus by PCR NEGATIVE NEGATIVE    Comment: (NOTE) Fact Sheet for Patients: BloggerCourse.com  Fact Sheet for Healthcare Providers: SeriousBroker.it  This test is not yet approved or cleared by the [place]  FDA and has been authorized for detection and/or diagnosis of SARS-CoV-2 by FDA under an Emergency Use Authorization (EUA). This EUA will remain in effect (meaning this test can be used) for the duration of  the COVID-19 declaration under Section 564(b)(1) of the Act, 21 U.S.C. section 360bbb-3(b)(1), unless the authorization is terminated or revoked.  Performed at [facility], [address] [place], [place] [address]   Ethanol     Status: Abnormal   Collection Time: 10/10/23 10:51 PM  Result Value Ref Range   Alcohol, Ethyl (B) 35 (H) <10 mg/dL    Comment: (NOTE) For medical purposes only. Performed at [facility], [address] [place], [place] [address]   C-reactive protein     Status: None   Collection Time: 10/10/23 10:51 PM  Result Value Ref Range   CRP <0.5 <1.0 mg/dL    Comment: Performed at [facility], [address], [place], [place] [address]  Lipase, blood     Status: None   Collection Time: 10/12/23 11:19 PM  Result Value Ref Range   Lipase 20 11 - 51 U/L    Comment: Performed at [facility], [address] [place], [place] [address]  Comprehensive metabolic panel     Status: Abnormal   Collection Time: 10/12/23 11:19 PM  Result Value Ref Range   Sodium 141 135 - 145 mmol/L   Potassium 3.3 (L) 3.5 - 5.1 mmol/L   Chloride 102 98 - 111 mmol/L   CO2 25 22 - 32 mmol/L   Glucose, Bld 109 (H) 70 - 99 mg/dL    Comment: Glucose reference range applies only to samples taken after fasting for at least 8 hours.   BUN <5 (L) 6 - 20 mg/dL   Creatinine, Ser 4.69 0.44 - 1.00 mg/dL   Calcium  8.2 (L) 8.9 - 10.3 mg/dL   Total Protein 6.1 (L) 6.5 - 8.1 g/dL   Albumin 3.4 (L) 3.5 - 5.0 g/dL   AST 629 (H) 15 - 41 U/L   ALT 89 (H) 0 - 44 U/L   Alkaline Phosphatase 196 (H) 38 - 126 U/L   Total Bilirubin 1.2 0.0 - 1.2 mg/dL   GFR, Estimated >52 >84 mL/min    Comment: (NOTE) Calculated using the CKD-EPI Creatinine Equation (2021)    Anion gap 14 5 - 15    Comment: Performed at [facility], [address] [place], [place] [address]  CBC     Status: Abnormal   Collection Time: 10/12/23 11:19 PM  Result Value Ref Range   WBC  9.7 4.0 - 10.5 K/uL   RBC 4.08 3.87 - 5.11 MIL/uL   Hemoglobin 15.6 (H) 12.0 - 15.0 g/dL   HCT 01.0 27.2 - 53.6 %   MCV 108.6 (H) 80.0 - 100.0 fL   MCH 38.2 (H) 26.0 - 34.0 pg   MCHC 35.2 30.0 - 36.0 g/dL   RDW 64.4 03.4 - 74.2 %   Platelets 220 150 - 400 K/uL   nRBC 0.0 0.0 - 0.2 %    Comment: Performed at [facility], [address], [place], [place] [address]  Surgical pathology     Status: None   Collection Time: 10/13/23 12:00 AM  Result Value Ref Range   SURGICAL PATHOLOGY      SURGICAL PATHOLOGY [facility] [address], [address] [place], [place] [address] Telephone [phone] or [phone] Fax [phone]  REPORT OF SURGICAL PATHOLOGY   Accession #: [id number] Patient Name: ABEGAIL, KLOEPPEL Visit # : [id number]  MRN: [id number] Physician: Eldred Grego DOB/Age 07-16-1975 (Age: 81) Gender: F Collected Date: 10/13/2023 Received Date: 10/13/2023  FINAL DIAGNOSIS       1. Gallbladder,  :       - CHRONIC CHOLECYSTITIS WITH CHOLELITHIASIS.      - NEGATIVE FOR MALIGNANCY.       DATE SIGNED OUT: 10/14/2023 ELECTRONIC SIGNATURE : Brunetta Capes Md, Alexandria Ida , Pathologist, Electronic Signature  MICROSCOPIC DESCRIPTION  CASE COMMENTS STAINS USED IN DIAGNOSIS: H&E    CLINICAL HISTORY  SPECIMEN(S) OBTAINED 1. Gallbladder,  SPECIMEN COMMENTS: SPECIMEN CLINICAL INFORMATION: 1. Acute cholecystitis    Gross Description 1. Size/?Intact: 7.5 x 3.4 cm; intact      Serosal surface: Pink-tan, smooth, and  glistening with a mild amount of adipose      tissue and a mildly roughened hepatic bed.      Mucosa/Wall: Tan-red, velvety mucosa; 0.2-0.4 cm thick wall. Discrete lesions      are not identified.      Contents: Moderate amount of tenacious, green-yellow bile admixed with a 1.3 x      0.5 x 0.3 cm aggregate of black stone fragments (less than 0.1 cm in greatest      dimension).      Cystic duct: 0.1 cm diameter, patent;  received with a single white, plastic      clamp; inked black.      Block Summary: Representative sections including the cystic duct margin (en      face) are submitted in 1 block (1A).      Collection time: 10/13/2023 at 1310      Time in formalin: 10/13/2023 at 1502      Cold ischemia time: 112 minutes      AMG 10/13/2023        Report signed out from the following location(s) [place] [address] [place], [place] [address] CLIA #: [id number]  [facility] [address] [place], [place] [address] CLIA  #: [id number]   Urinalysis, Routine w reflex microscopic -Urine, Clean Catch     Status: Abnormal   Collection Time: 10/13/23  2:51 AM  Result Value Ref Range   Color, Urine YELLOW (A) YELLOW   APPearance CLEAR (A) CLEAR   Specific Gravity, Urine 1.011 1.005 - 1.030   pH 7.0 5.0 - 8.0   Glucose, UA NEGATIVE NEGATIVE mg/dL   Hgb urine dipstick NEGATIVE NEGATIVE   Bilirubin Urine NEGATIVE NEGATIVE   Ketones, ur NEGATIVE NEGATIVE mg/dL   Protein, ur NEGATIVE NEGATIVE mg/dL   Nitrite NEGATIVE NEGATIVE   Leukocytes,Ua NEGATIVE NEGATIVE    Comment: Performed at [facility], [address] [place], [place] [address]  CBC with Differential/Platelet     Status: Abnormal   Collection Time: 10/14/23  4:49 AM  Result Value Ref Range   WBC 4.6 4.0 - 10.5 K/uL   RBC 3.98 3.87 - 5.11 MIL/uL   Hemoglobin 14.8 12.0 - 15.0 g/dL   HCT 96.2 95.2 - 84.1 %   MCV 106.3 (H) 80.0 - 100.0 fL   MCH 37.2 (H) 26.0 - 34.0 pg   MCHC 35.0 30.0 - 36.0 g/dL   RDW 32.4 40.1 - 02.7 %   Platelets 170 150 - 400 K/uL   nRBC 0.0 0.0 - 0.2 %   Neutrophils Relative % 80 %   Neutro Abs 3.6 1.7 - 7.7 K/uL   Lymphocytes Relative 14 %   Lymphs Abs 0.7 0.7 - 4.0 K/uL   Monocytes Relative 6 %   Monocytes Absolute 0.3 0.1 - 1.0 K/uL   Eosinophils Relative 0 %   Eosinophils Absolute 0.0 0.0 - 0.5 K/uL   Basophils Relative 0 %   Basophils Absolute 0.0 0.0  - 0.1 K/uL   Immature Granulocytes 0 %   Abs Immature Granulocytes 0.01 0.00 - 0.07 K/uL    Comment: Performed at [facility], [address] [place],  [place] [address]  Comprehensive metabolic panel     Status: Abnormal   Collection Time: 10/14/23  4:49 AM  Result Value Ref Range   Sodium 139 135 - 145 mmol/L   Potassium 3.5 3.5 - 5.1 mmol/L   Chloride 104 98 - 111 mmol/L   CO2 25 22 - 32 mmol/L   Glucose, Bld 108 (H) 70 - 99 mg/dL    Comment: Glucose reference range applies only to samples taken after fasting for at least 8 hours.   BUN <5 (L) 6 - 20 mg/dL   Creatinine, Ser 5.62 0.44 - 1.00 mg/dL   Calcium  8.0 (L) 8.9 - 10.3 mg/dL   Total Protein 5.3 (L) 6.5 - 8.1 g/dL   Albumin 3.1 (L) 3.5 - 5.0 g/dL   AST 54 (H) 15 - 41 U/L   ALT 50 (H) 0 - 44 U/L   Alkaline Phosphatase 165 (H) 38 - 126 U/L   Total Bilirubin 1.0 0.0 - 1.2 mg/dL   GFR, Estimated >13 >08 mL/min    Comment: (NOTE) Calculated using the CKD-EPI Creatinine Equation (2021)    Anion gap 10 5 - 15    Comment: Performed at [facility], [address] [place], [place] [address]  Comprehensive metabolic panel with GFR     Status: Abnormal   Collection Time: 11/02/23  8:57 AM  Result Value Ref Range   Glucose, Bld 76 65 - 99 mg/dL    Comment: .            Fasting reference interval .    BUN 8 7 - 25 mg/dL   Creat 6.96 2.95 - 2.84 mg/dL   eGFR 132 > OR = 60 GM/WNU/2.72Z3   BUN/Creatinine Ratio SEE NOTE: 6 - 22 (calc)    Comment:    Not Reported: BUN and Creatinine are within    reference range. .    Sodium 142 135 - 146 mmol/L   Potassium 4.7 3.5 - 5.3 mmol/L   Chloride 103 98 - 110 mmol/L   CO2 31 20 - 32 mmol/L   Calcium  9.6 8.6 - 10.2 mg/dL   Total Protein 6.0 (L) 6.1 - 8.1 g/dL   Albumin 4.0 3.6 - 5.1 g/dL   Globulin 2.0 1.9 - 3.7 g/dL (calc)   AG Ratio 2.0 1.0 - 2.5 (calc)   Total Bilirubin 0.5 0.2 - 1.2 mg/dL   Alkaline phosphatase (APISO) 221 (H) 31 - 125 U/L   AST 56 (H) 10  - 35 U/L   ALT 59 (H) 6 - 29 U/L    Radiology No results found.  Assessment/Plan  Subclavian artery stenosis, right (HCC) Duplex today shows monophasic flow beyond the right subclavian artery consistent with a likely stenosis at this location although the velocities are not that elevated. We had a long discussion today regarding how serious the situation is.  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  She is now symptomatic in her right arm and has a right subclavian stenosis.  This is in a very suboptimal location for stent placement due to the vertebral artery, but at some point we may have to place an open Celt stent in this location.  She really needs to stop smoking.  We talked about that at length today.  We discussed that I would much prefer her to stop smoking before we consider any other surgery or intervention to try to improve the durability.  She is going to do her best to  try to quit smoking and we will see her back in a few months.   Renal artery stenosis (HCC) Status post renal artery stenting for severe hypertension.  To be checked again later this year.   Smoking We had a discussion for approximately 3 minutes regarding the absolute need for smoking cessation due to the deleterious nature of tobacco on the vascular system. We discussed the tobacco use would diminish patency of any intervention, and likely significantly worsen progressio of disease. We discussed multiple agents for quitting including replacement therapy or medications to reduce cravings such as Chantix. The patient voices their understanding of the importance of smoking cessation.  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  Carotid occlusions are generally treated medically.  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  Any errors from dictation are purely unintentional

## 2023-12-15 ENCOUNTER — Other Ambulatory Visit: Payer: Self-pay | Admitting: Family Medicine

## 2023-12-15 DIAGNOSIS — G8929 Other chronic pain: Secondary | ICD-10-CM

## 2023-12-15 DIAGNOSIS — R112 Nausea with vomiting, unspecified: Secondary | ICD-10-CM

## 2023-12-15 NOTE — Telephone Encounter (Signed)
 Requested medication (s) are due for refill today: yes  Requested medication (s) are on the active medication list: yes  Last refill:  10/12/23 #30/0  Future visit scheduled: no  Notes to clinic:  Unable to refill per protocol, cannot delegate.      Requested Prescriptions  Pending Prescriptions Disp Refills   ondansetron  (ZOFRAN -ODT) 4 MG disintegrating tablet [Pharmacy Med Name: ONDANSETRON  ODT 4 MG TABLET] 30 tablet 0    Sig: TAKE 1 TABLET EVERY 8 HOURS AS NEEDED FOR NAUSEA AND VOMITING.     Not Delegated - Gastroenterology: Antiemetics - ondansetron  Failed - 12/15/2023 10:40 AM      Failed - This refill cannot be delegated      Failed - AST in normal range and within 360 days    AST  Date Value Ref Range Status  11/02/2023 56 (H) 10 - 35 U/L Final         Failed - ALT in normal range and within 360 days    ALT  Date Value Ref Range Status  11/02/2023 59 (H) 6 - 29 U/L Final         Failed - Valid encounter within last 6 months    Recent Outpatient Visits           1 month ago Bilateral edema of lower extremity   [place] [facility] [place], [place] J, DO   1 month ago Symptomatic cholelithiasis   [place] [facility] Ed[place] Marsa PARAS, DO   3 months ago Subclavian artery stenosis, right [facility])   [place] [facility] [place], Marsa PARAS, [place]

## 2023-12-15 NOTE — Telephone Encounter (Signed)
 Requested medication (s) are due for refill today: Yes  Requested medication (s) are on the active medication list: Yes  Last refill:  11/17/23  Future visit scheduled: No  Notes to clinic:  Unable to refill per protocol, cannot delegate.      Requested Prescriptions  Pending Prescriptions Disp Refills   oxyCODONE -acetaminophen  (PERCOCET/ROXICET) 5-325 MG tablet [Pharmacy Med Name: OXYCOD/APAP 5/325 MG] 120 tablet 0    Sig: Take 1 tablet by mouth every 4 (four) hours as needed for severe pain (pain score 7-10). Max 4 per day     Not Delegated - Analgesics:  Opioid Agonist Combinations Failed - 12/15/2023  3:55 PM      Failed - This refill cannot be delegated      Failed - Urine Drug Screen completed in last 360 days      Failed - Valid encounter within last 3 months    Recent Outpatient Visits           1 month ago Bilateral edema of lower extremity   [place] [facility] [place], Marsa PARAS, DO   1 month ago Symptomatic cholelithiasis   [place] [facility] Ed[place] Marsa PARAS, DO   3 months ago Subclavian artery stenosis, right [facility])   [place] [facility] [place], Marsa PARAS, [place]

## 2023-12-21 ENCOUNTER — Other Ambulatory Visit: Payer: Self-pay | Admitting: Family Medicine

## 2023-12-21 DIAGNOSIS — G8929 Other chronic pain: Secondary | ICD-10-CM

## 2023-12-22 ENCOUNTER — Other Ambulatory Visit: Payer: Self-pay | Admitting: Family Medicine

## 2023-12-22 DIAGNOSIS — G8929 Other chronic pain: Secondary | ICD-10-CM

## 2023-12-22 MED ORDER — GABAPENTIN 300 MG PO CAPS: ORAL_CAPSULE | ORAL | 1 refills | Status: DC

## 2023-12-22 NOTE — Telephone Encounter (Signed)
 Prescription Request  12/22/2023  LOV: 11/02/2023  What is the name of the medication or equipment? gabapentin   Have you contacted your pharmacy to request a refill? Yes   Which pharmacy would you like this sent to?  [facility] - [place], [place] - [address] [place] [address] Phone: [phone] Fax: [phone]    Patient notified that their request is being sent to the clinical staff for review and that they should receive a response within 2 business days.   Please advise at Mobile [phone] (mobile)

## 2023-12-22 NOTE — Telephone Encounter (Unsigned)
 Copied from CRM [id number]. Topic: Clinical - Medication Refill >> Dec 22, 2023 11:10 AM Zebedee SAUNDERS wrote: Medication: gabapentin  (NEURONTIN ) 300 MG capsule  Has the patient contacted their pharmacy? Yes (Agent: If no, request that the patient contact the pharmacy for the refill. If patient does not wish to contact the pharmacy document the reason why and proceed with request.) (Agent: If yes, when and what did the pharmacy advise?)Pharmacy need PCP approval  This is the patient's preferred pharmacy:  [facility] - [place], [place] - [address] [place] [address] Phone: [phone] Fax: [phone]  Is this the correct pharmacy for this prescription? Yes If no, delete pharmacy and type the correct one.   Has the prescription been filled recently? Yes  Is the patient out of the medication? Yes  Has the patient been seen for an appointment in the last year OR does the patient have an upcoming appointment? Yes  Can we respond through MyChart? Yes  Agent: Please be advised that Rx refills may take up to 3 business days. We ask that you follow-up with your pharmacy.

## 2024-01-01 ENCOUNTER — Encounter: Payer: Self-pay | Admitting: Family Medicine

## 2024-01-03 ENCOUNTER — Ambulatory Visit: Payer: Self-pay

## 2024-01-03 NOTE — Telephone Encounter (Signed)
 FYI Only or Action Required?: FYI only for provider.  Patient was last seen in primary care on 11/02/2023 by Edman Marsa PARAS, DO.  Called Nurse Triage reporting Vomiting.  Symptoms began several months ago.  Interventions attempted: Prescription medications: antiemetics, but does not have any now.  Symptoms are: gradually worsening.  Triage Disposition: See PCP When Office is Open (Within 3 Days)  Patient/caregiver understands and will follow disposition?: Yes    Copied from CRM [id number]. Topic: Clinical - Red Word Triage >> Jan 03, 2024 11:39 AM Silvana PARAS wrote: Red Word that prompted transfer to Nurse Triage: Nausea, vomiting, and diarrhea     Reason for Disposition  [1] MILD vomiting with diarrhea AND [2] present > 5 days  Answer Assessment - Initial Assessment Questions 1. VOMITING SEVERITY: How many times have you vomited in the past 24 hours?      3-4 times  2. ONSET: When did the vomiting begin?      Months  3. FLUIDS: What fluids or food have you vomited up today? Have you been able to keep any fluids down?     Keeping fluids down  4. ABDOMEN PAIN: Are your having any abdomen pain? If Yes : How bad is it and what does it feel like? (e.g., crampy, dull, intermittent, constant)      Mild 5. DIARRHEA: Is there any diarrhea? If Yes, ask: How many times today?      Yes, once  6. CONTACTS: Is there anyone else in the family with the same symptoms?      No 7. CAUSE: What do you think is causing your vomiting?     Unsure, ongoing problem  8. HYDRATION STATUS: Any signs of dehydration? (e.g., dry mouth [not only dry lips], too weak to stand) When did you last urinate?     No 9. OTHER SYMPTOMS: Do you have any other symptoms? (e.g., fever, headache, vertigo, vomiting blood or coffee grounds, recent head injury)     No  Protocols used: Vomiting-A-AH

## 2024-01-03 NOTE — Telephone Encounter (Signed)
 Will discuss at upcoming appointment

## 2024-01-06 ENCOUNTER — Encounter: Payer: Self-pay | Admitting: Internal Medicine

## 2024-01-06 ENCOUNTER — Ambulatory Visit (INDEPENDENT_AMBULATORY_CARE_PROVIDER_SITE_OTHER): Admitting: Internal Medicine

## 2024-01-06 VITALS — BP 138/76 | Ht 62.0 in | Wt 113.2 lb

## 2024-01-06 DIAGNOSIS — R140 Abdominal distension (gaseous): Secondary | ICD-10-CM | POA: Diagnosis not present

## 2024-01-06 DIAGNOSIS — K219 Gastro-esophageal reflux disease without esophagitis: Secondary | ICD-10-CM

## 2024-01-06 DIAGNOSIS — R112 Nausea with vomiting, unspecified: Secondary | ICD-10-CM | POA: Diagnosis not present

## 2024-01-06 DIAGNOSIS — R142 Eructation: Secondary | ICD-10-CM

## 2024-01-06 DIAGNOSIS — R630 Anorexia: Secondary | ICD-10-CM

## 2024-01-06 DIAGNOSIS — K591 Functional diarrhea: Secondary | ICD-10-CM

## 2024-01-06 LAB — HEPATIC FUNCTION PANEL
AG Ratio: 1.8 (calc) (ref 1.0–2.5)
ALT: 43 U/L — ABNORMAL HIGH (ref 6–29)
AST: 49 U/L — ABNORMAL HIGH (ref 10–35)
Albumin: 3.4 g/dL — ABNORMAL LOW (ref 3.6–5.1)
Alkaline phosphatase (APISO): 203 U/L — ABNORMAL HIGH (ref 31–125)
Bilirubin, Direct: 0.2 mg/dL (ref 0.0–0.2)
Globulin: 1.9 g/dL (ref 1.9–3.7)
Indirect Bilirubin: 0.4 mg/dL (ref 0.2–1.2)
Total Bilirubin: 0.6 mg/dL (ref 0.2–1.2)
Total Protein: 5.3 g/dL — ABNORMAL LOW (ref 6.1–8.1)

## 2024-01-06 LAB — CBC
HCT: 41 % (ref 35.0–45.0)
Hemoglobin: 13.6 g/dL (ref 11.7–15.5)
MCH: 37 pg — ABNORMAL HIGH (ref 27.0–33.0)
MCHC: 33.2 g/dL (ref 32.0–36.0)
MCV: 111.4 fL — ABNORMAL HIGH (ref 80.0–100.0)
MPV: 12.2 fL (ref 7.5–12.5)
Platelets: 181 Thousand/uL (ref 140–400)
RBC: 3.68 Million/uL — ABNORMAL LOW (ref 3.80–5.10)
RDW: 13.5 % (ref 11.0–15.0)
WBC: 7.3 Thousand/uL (ref 3.8–10.8)

## 2024-01-06 MED ORDER — ONDANSETRON 4 MG PO TBDP: 4.0000 mg | ORAL_TABLET | Freq: Three times a day (TID) | ORAL | 0 refills | Status: DC | PRN

## 2024-01-06 NOTE — Progress Notes (Addendum)
 Subjective:    Patient ID: Joanna Garcia, female    DOB: December 08, 1975, 48 y.o.   MRN: [id number]  HPI  Discussed the use of AI scribe software for clinical note transcription with the patient, who gave verbal consent to proceed.  Joanna Garcia is a 48 year old female who presents with persistent nausea and vomiting.  She has been experiencing nausea for several months and was prescribed Zofran , which initially provided relief but now only alleviates symptoms for a couple of hours. She continues to experience daily nausea and vomiting, with the vomitus being yellow.  This can occur before and after eating.  Over the past month, she has developed excessive burping, bloating, and a significant decrease in appetite. She feels full after consuming a small amount of food, which has led to missed work due to feeling unwell. She is concerned about the potential impact on her job.  She has a history of constipation and has recently experienced diarrhea, which she attributes to her gallbladder removal in April. No blood in stool is reported. She also experiences reflux and heartburn, describing a raw feeling, possibly from frequent vomiting.  Her current medications include pantoprazole  40 mg once daily and Zofran  for nausea.  She denies any recent change in diet, medications.  She denies excessive stress or worrying.  She has not been on antibiotics recently.  She underwent a colonoscopy in 2020, which was normal, and has not been diagnosed with IBS. She has lost approximately four pounds since her gallbladder surgery in April, going from 117 to 113 pounds.       Review of Systems   Past Medical History:  Diagnosis Date   DVT (deep venous thrombosis) (HCC)    GERD (gastroesophageal reflux disease)    Hypertension    Polycythemia    Polyneuropathy    Stroke [facility])     Current Outpatient Medications  Medication Sig Dispense Refill   amLODipine  (NORVASC ) 5 MG tablet Take 1  tablet (5 mg total) by mouth daily. 90 tablet 0   aspirin  81 MG chewable tablet Chew and swallow 1 tablet (81 mg total) by mouth daily. 30 tablet 2   atorvastatin  (LIPITOR ) 80 MG tablet Take 1 tablet (80 mg total) by mouth daily. 90 tablet 3   clopidogrel  (PLAVIX ) 75 MG tablet Take 1 tablet (75 mg total) by mouth daily. 90 tablet 3   cyanocobalamin  (VITAMIN B12) 1000 MCG/ML injection Inject 1 mL (1,000 mcg total) into the muscle every 30 (thirty) days. For 4 months 4 mL 0   furosemide  (LASIX ) 20 MG tablet Take 1 tablet (20 mg total) by mouth daily as needed. 30 tablet 0   gabapentin  (NEURONTIN ) 300 MG capsule Take 3 caps = 900mg  dose in morning and afternoon and take 4 caps = 1200mg  in evening. Max dose is 10 capsules in 24 hours. 900 capsule 1   latanoprost (XALATAN) 0.005 % ophthalmic solution SMARTSIG:In Eye(s)     losartan  (COZAAR ) 25 MG tablet Take 1 tablet (25 mg total) by mouth daily. 90 tablet 3   ondansetron  (ZOFRAN -ODT) 4 MG disintegrating tablet TAKE 1 TABLET EVERY 8 HOURS AS NEEDED FOR NAUSEA AND VOMITING. 30 tablet 0   oxyCODONE -acetaminophen  (PERCOCET/ROXICET) 5-325 MG tablet Take 1 tablet by mouth every 4 (four) hours as needed for severe pain (pain score 7-10). Max 4 per day 120 tablet 0   pantoprazole  (PROTONIX ) 40 MG tablet Take 1 tablet (40 mg total) by mouth daily before breakfast. STOP OMEPRAZOLE   AND START PANTOPRAZOLE  PER MD 90 tablet 0   potassium chloride  SA (KLOR-CON  M) 20 MEQ tablet Take 1 tablet (20 mEq total) by mouth daily for 3 days. 3 tablet 0   REPATHA  SURECLICK 140 MG/ML SOAJ Inject 140 mg into the skin every 14 (fourteen) days. (Patient not taking: Reported on 11/02/2023) 2 mL 2   tiZANidine  (ZANAFLEX ) 4 MG tablet TAKE ONE TABLET EVERY 8 HOURS AS NEEDED FOR MUSCLE SPASM. 90 tablet 2   No current facility-administered medications for this visit.    No Known Allergies  Family History  Problem Relation Age of Onset   Hypertension Mother    Hyperlipidemia Mother     Heart disease Mother    COPD Mother    Hemochromatosis Mother    Hypertension Father    Hyperlipidemia Father    Cancer Father        prostate   Hemochromatosis Maternal Aunt    Hemochromatosis Maternal Aunt    Hemochromatosis Maternal Uncle    Leukemia Paternal Uncle    Cancer Paternal Grandmother    Breast cancer Paternal Grandmother     Social History   Socioeconomic History   Marital status: Divorced    Spouse name: Not on file   Number of children: Not on file   Years of education: Not on file   Highest education level: 11th grade  Occupational History   Not on file  Tobacco Use   Smoking status: Every Day    Current packs/day: 0.75    Average packs/day: 0.8 packs/day for 31.5 years (23.7 ttl pk-yrs)    Types: Cigarettes    Start date: 06/21/1992   Smokeless tobacco: Never  Vaping Use   Vaping status: Never Used  Substance and Sexual Activity   Alcohol use: Yes    Comment: 24 Oz daily   Drug use: Not Currently    Types: Marijuana   Sexual activity: Not Currently  Other Topics Concern   Not on file  Social History Narrative   Lives at home with mother and 2 dogs Charlie and Clauie    Drives   Social Drivers of Health   Financial Resource Strain: Patient Declined (06/03/2023)   Overall Financial Resource Strain (CARDIA)    Difficulty of Paying Living Expenses: Patient declined  Food Insecurity: No Food Insecurity (10/13/2023)   Hunger Vital Sign    Worried About Running Out of Food in the Last Year: Never true    Ran Out of Food in the Last Year: Never true  Transportation Needs: No Transportation Needs (10/13/2023)   PRAPARE - Administrator, Civil Service (Medical): No    Lack of Transportation (Non-Medical): No  Physical Activity: Unknown (06/03/2023)   Exercise Vital Sign    Days of Exercise per Week: 5 days    Minutes of Exercise per Session: Patient declined  Stress: No Stress Concern Present (06/03/2023)   [employer] of  Occupational Health - Occupational Stress Questionnaire    Feeling of Stress : Not at all  Social Connections: Moderately Isolated (10/13/2023)   Social Connection and Isolation Panel    Frequency of Communication with Friends and Family: Three times a week    Frequency of Social Gatherings with Friends and Family: Three times a week    Attends Religious Services: More than 4 times per year    Active Member of Clubs or Organizations: No    Attends Banker Meetings: Never    Marital Status: Divorced  Catering manager  Violence: Not At Risk (10/13/2023)   Humiliation, Afraid, Rape, and Kick questionnaire    Fear of Current or Ex-Partner: No    Emotionally Abused: No    Physically Abused: No    Sexually Abused: No     Constitutional: Denies fever, malaise, fatigue, headache or abrupt weight changes.  Respiratory: Denies difficulty breathing, shortness of breath, cough or sputum production.   Cardiovascular: Denies chest pain, chest tightness, palpitations or swelling in the hands or feet.  Gastrointestinal: Patient reports decreased appetite, belching, bloating, vomiting and constipation, diarrhea.  Denies abdominal pain, or blood in the stool.  GU: Denies urgency, frequency, pain with urination, burning sensation, blood in urine, odor or discharge. Skin: Denies redness, rashes, lesions or ulcercations.   No other specific complaints in a complete review of systems (except as listed in HPI above).      Objective:   Physical Exam  BP 138/76   Ht 5' 2 (1.575 m)   Wt 113 lb 3.2 oz (51.3 kg)   BMI 20.70 kg/m   Wt Readings from Last 3 Encounters:  12/02/23 113 lb (51.3 kg)  11/02/23 115 lb 6 oz (52.3 kg)  10/20/23 120 lb (54.4 kg)    General: Appears her stated age, well developed, well nourished in NAD. Skin: Warm, dry and intact. No rashes noted. Cardiovascular: Normal rate and rhythm. S1,S2 noted.  Murmur noted.  Pulmonary/Chest: Normal effort and positive  vesicular breath sounds. No respiratory distress. No wheezes, rales or ronchi noted.  Abdomen: Soft and nontender.  Hyperactive bowel sounds. No distention or masses noted. Musculoskeletal: No difficulty with gait.  Neurological: Alert and oriented.   BMET    Component Value Date/Time   NA 142 11/02/2023 0857   K 4.7 11/02/2023 0857   CL 103 11/02/2023 0857   CO2 31 11/02/2023 0857   GLUCOSE 76 11/02/2023 0857   BUN 8 11/02/2023 0857   CREATININE 0.67 11/02/2023 0857   CALCIUM  9.6 11/02/2023 0857   GFRNONAA >60 10/14/2023 0449   GFRAA >60 06/05/2019 1710    Lipid Panel     Component Value Date/Time   CHOL 169 09/23/2023 0759   TRIG 94 09/23/2023 0759   HDL 97 09/23/2023 0759   CHOLHDL 1.7 09/23/2023 0759   VLDL 41 (H) 01/22/2023 1730   LDLCALC 54 09/23/2023 0759    CBC    Component Value Date/Time   WBC 4.6 10/14/2023 0449   RBC 3.98 10/14/2023 0449   HGB 14.8 10/14/2023 0449   HGB 15.2 (H) 11/22/2022 0913   HCT 42.3 10/14/2023 0449   PLT 170 10/14/2023 0449   PLT 208 11/22/2022 0913   MCV 106.3 (H) 10/14/2023 0449   MCH 37.2 (H) 10/14/2023 0449   MCHC 35.0 10/14/2023 0449   RDW 12.5 10/14/2023 0449   LYMPHSABS 0.7 10/14/2023 0449   MONOABS 0.3 10/14/2023 0449   EOSABS 0.0 10/14/2023 0449   BASOSABS 0.0 10/14/2023 0449    Hgb A1C Lab Results  Component Value Date   HGBA1C 5.7 (H) 12/22/2022            Assessment & Plan:  Assessment and Plan    Chronic Nausea, Vomiting, Belching, Bloating, Constipation, Diarrhea: Persistent nausea and vomiting with new symptoms of burping, bloating, and early satiety. Current pantoprazole  use may mask H. pylori testing. - Order CBC and hepatic function panel. - Order H. pylori breath test after stopping pantoprazole  for two weeks. -Discussed that stopping her pantoprazole  may worsen her GI symptoms however she refuses  to do H. pylori stool test.  Normal - Refer to GI specialist for further evaluation, including  potential upper endoscopy. - If labs unremarkable, could consider lowering atorvastatin  for 2 weeks to see if this is the cause of her symptoms.  She is concerned about this given her history of stroke.  Insurance would not cover Repatha .  She would like me to discuss this with her PCP first.  Gastroesophageal Reflux Disease (GERD) Symptoms align with GERD, potentially worsened by frequent vomiting. Managed with pantoprazole , but holding it for H. pylori testing may exacerbate symptoms. - Hold pantoprazole  for two weeks for H. pylori breath test. - Consider increasing pantoprazole  to twice daily if symptoms persist or worsen.    Follow-up with your PCP as previously scheduled Angeline Laura, NP

## 2024-01-06 NOTE — Patient Instructions (Signed)
Nausea, Adult Nausea is feeling like you may vomit. Feeling like you may vomit is usually not serious, but it may be an early sign of a more serious medical problem. Vomiting is when stomach contents forcefully come out of your mouth. If you vomit, or if you are not able to drink enough fluids, you may not have enough water in your body (get dehydrated). If you do not have enough water in your body, you may: Feel tired. Feel thirsty. Have a dry mouth. Have cracked lips. Pee (urinate) less often. Older adults and people who have other diseases or a weak body defense system (immune system) have a higher risk of not having enough water in the body. The main goals of treating this condition are: To relieve your nausea. To ensure your nausea occurs less often. To prevent vomiting and losing too much fluid. Follow these instructions at home: Watch your symptoms for any changes. Tell your doctor about them. Eating and drinking     Take an ORS (oral rehydration solution). This is a drink that is sold at pharmacies and stores. Drink clear fluids in small amounts as you are able. These include: Water. Ice chips. Fruit juice that has water added (diluted fruit juice). Low-calorie sports drinks. Eat bland, easy-to-digest foods in small amounts as you are able, such as: Bananas. Applesauce. Rice. Low-fat (lean) meats. Toast. Crackers. Avoid drinking fluids that have a lot of sugar or caffeine in them. This includes energy drinks, sports drinks, and soda. Avoid alcohol. Avoid spicy or fatty foods. General instructions Take over-the-counter and prescription medicines only as told by your doctor. Rest at home while you get better. Drink enough fluid to keep your pee (urine) pale yellow. Take slow and deep breaths when you feel like you may vomit. Avoid food or things that have strong smells. Wash your hands often with soap and water for at least 20 seconds. If you cannot use soap and water,  use hand sanitizer. Make sure that everyone in your home washes their hands well and often. Keep all follow-up visits. Contact a doctor if: You feel worse. You feel like you may vomit and this lasts for more than 2 days. You vomit. You are not able to drink fluids without vomiting. You have new symptoms. You have a fever. You have a headache. You have muscle cramps. You have a rash. You have pain while peeing. You feel light-headed or dizzy. Get help right away if: You have pain in your chest, neck, arm, or jaw. You feel very weak or you faint. You have vomit that is bright red or looks like coffee grounds. You have bloody or black poop (stools) or poop that looks like tar. You have a very bad headache, a stiff neck, or both. You have very bad pain, cramping, or bloating in your belly (abdomen). You have trouble breathing or you are breathing very quickly. Your heart is beating very quickly. Your skin feels cold and clammy. You feel confused. You have signs of losing too much water in your body, such as: Dark pee, very little pee, or no pee. Cracked lips. Dry mouth. Sunken eyes. Sleepiness. Weakness. These symptoms may be an emergency. Get help right away. Call 911. Do not wait to see if the symptoms will go away. Do not drive yourself to the hospital. Summary Nausea is feeling like you are about vomit. If you vomit, or if you are not able to drink enough fluids, you may not have enough water in   your body (get dehydrated). Eat and drink what your doctor tells you. Take over-the-counter and prescription medicines only as told by your doctor. Contact a doctor right away if your symptoms get worse or you have new symptoms. Keep all follow-up visits. This information is not intended to replace advice given to you by your health care provider. Make sure you discuss any questions you have with your health care provider. Document Revised: 12/12/2020 Document Reviewed:  12/12/2020 Elsevier Patient Education  2024 Elsevier Inc.  

## 2024-01-07 ENCOUNTER — Ambulatory Visit: Payer: Self-pay | Admitting: Internal Medicine

## 2024-01-07 DIAGNOSIS — K219 Gastro-esophageal reflux disease without esophagitis: Secondary | ICD-10-CM

## 2024-01-07 DIAGNOSIS — R140 Abdominal distension (gaseous): Secondary | ICD-10-CM

## 2024-01-07 DIAGNOSIS — R112 Nausea with vomiting, unspecified: Secondary | ICD-10-CM

## 2024-01-11 ENCOUNTER — Encounter: Payer: Self-pay | Admitting: Family Medicine

## 2024-01-11 ENCOUNTER — Other Ambulatory Visit: Payer: Self-pay | Admitting: Family Medicine

## 2024-01-11 DIAGNOSIS — G8929 Other chronic pain: Secondary | ICD-10-CM

## 2024-01-11 DIAGNOSIS — M545 Low back pain, unspecified: Secondary | ICD-10-CM

## 2024-01-11 NOTE — Telephone Encounter (Signed)
 Requested medication (s) are due for refill today: yes  Requested medication (s) are on the active medication list: yes  Last refill:  12/15/23 #120/0  Future visit scheduled: yes  Notes to clinic:  Unable to refill per protocol, cannot delegate.      Requested Prescriptions  Pending Prescriptions Disp Refills   oxyCODONE -acetaminophen  (PERCOCET/ROXICET) 5-325 MG tablet [Pharmacy Med Name: OXYCOD/APAP 5/325 MG] 120 tablet 0    Sig: Take 1 tablet by mouth every 4 (four) hours as needed for severe pain (pain score 7-10). Max 4 per day     Not Delegated - Analgesics:  Opioid Agonist Combinations Failed - 01/11/2024  9:31 AM      Failed - This refill cannot be delegated      Failed - Urine Drug Screen completed in last 360 days      Passed - Valid encounter within last 3 months    Recent Outpatient Visits           5 days ago Functional diarrhea   [place] [facility] [place], Angeline ORN, NP   2 months ago Bilateral edema of lower extremity   [place] [facility] [place], Marsa PARAS, DO   2 months ago Symptomatic cholelithiasis   [place] [facility] Edman Marsa PARAS, DO   3 months ago Subclavian artery stenosis, right [facility])    [place] [facility] [place], Marsa PARAS, [place]

## 2024-01-23 ENCOUNTER — Other Ambulatory Visit

## 2024-02-07 ENCOUNTER — Other Ambulatory Visit: Payer: Self-pay | Admitting: Family Medicine

## 2024-02-07 DIAGNOSIS — M545 Low back pain, unspecified: Secondary | ICD-10-CM

## 2024-02-07 DIAGNOSIS — K219 Gastro-esophageal reflux disease without esophagitis: Secondary | ICD-10-CM

## 2024-02-07 DIAGNOSIS — G8929 Other chronic pain: Secondary | ICD-10-CM

## 2024-02-08 ENCOUNTER — Other Ambulatory Visit: Payer: Self-pay

## 2024-02-08 DIAGNOSIS — K219 Gastro-esophageal reflux disease without esophagitis: Secondary | ICD-10-CM

## 2024-02-21 ENCOUNTER — Other Ambulatory Visit: Payer: Self-pay | Admitting: Family Medicine

## 2024-02-21 DIAGNOSIS — I10 Essential (primary) hypertension: Secondary | ICD-10-CM

## 2024-02-22 NOTE — Telephone Encounter (Signed)
 Requested Prescriptions  Pending Prescriptions Disp Refills   amLODipine  (NORVASC ) 5 MG tablet [Pharmacy Med Name: AMLODIPINE  BESYLATE 5 MG TAB] 90 tablet 0    Sig: Take 1 tablet (5 mg total) by mouth daily.     Cardiovascular: Calcium  Channel Blockers 2 Passed - 02/22/2024  8:59 AM      Passed - Last BP in normal range    BP Readings from Last 1 Encounters:  01/06/24 138/76         Passed - Last Heart Rate in normal range    Pulse Readings from Last 1 Encounters:  12/02/23 92         Passed - Valid encounter within last 6 months    Recent Outpatient Visits           1 month ago Functional diarrhea   [facility] [place], Angeline ORN, NP   3 months ago Bilateral edema of lower extremity   [place] [place], Marsa PARAS, DO   4 months ago Symptomatic cholelithiasis   [place] [facility] Edman Marsa PARAS, DO   5 months ago Subclavian artery stenosis, right [facility])    [facility] [place], Marsa PARAS, [place]

## 2024-03-06 ENCOUNTER — Encounter: Payer: Self-pay | Admitting: Family Medicine

## 2024-03-06 DIAGNOSIS — G8929 Other chronic pain: Secondary | ICD-10-CM

## 2024-03-06 MED ORDER — OXYCODONE-ACETAMINOPHEN 5-325 MG PO TABS: 1.0000 | ORAL_TABLET | ORAL | 0 refills | Status: DC | PRN

## 2024-03-12 ENCOUNTER — Ambulatory Visit: Payer: Self-pay

## 2024-03-12 ENCOUNTER — Ambulatory Visit (INDEPENDENT_AMBULATORY_CARE_PROVIDER_SITE_OTHER): Admitting: Nurse Practitioner

## 2024-03-12 NOTE — Telephone Encounter (Signed)
 Dr. Edman has 6 opening tomorrow. Was there not a time on his schedule that she could accommodate?

## 2024-03-12 NOTE — Telephone Encounter (Signed)
 FYI Only or Action Required?: FYI only for provider.  Patient was last seen in primary care on 01/06/2024 by Antonette Angeline ORN, NP.  Called Nurse Triage reporting Vomiting and Migraine.  Symptoms began several days ago.  Interventions attempted: OTC medications: tylenol , BC powder. .  Symptoms are: unchanged. Pt declines appt for today. Appt scheduled for tomorrow morning.  Triage Disposition: See HCP Within 4 Hours (Or PCP Triage)  Patient/caregiver understands and will follow disposition?: Yes                  Copied from CRM [id number]. Topic: Clinical - Red Word Triage >> Mar 12, 2024 11:29 AM Delon HERO wrote: Red Word that prompted transfer to Nurse Triage: Patient is calling to report headaches, vomiting and nausea since last Thursday. Patient has not taken a COVID test. Reason for Disposition  [1] SEVERE headache (e.g., excruciating) AND [2] not improved after 2 hours of pain medicine  Answer Assessment - Initial Assessment Questions Pt reports HA - has been diagnosed with migraines a long time ago. Pt is also having nausea and vomiting. Pt is trying to eat crackers, Gatorade and ginger ale with mixed results. Pt cannot come in this afternoon and declines a vv with Dr. Edman.  Appt scheduled for tomorrow morning.     1. LOCATION: Where does it hurt?      Left side 2. ONSET: When did the headache start? (e.g., minutes, hours, days)      Thursday 3. PATTERN: Does the pain come and go, or has it been constant since it started?     Comes and goes 4. SEVERITY: How bad is the pain? and What does it keep you from doing?  (e.g., Scale 1-10; mild, moderate, or severe)     7-8/10 5. RECURRENT SYMPTOM: Have you ever had headaches before? If Yes, ask: When was the last time? and What happened that time?      Migraines 6. CAUSE: What do you think is causing the headache?     Migraine 7. MIGRAINE: Have you been diagnosed with migraine headaches?  If Yes, ask: Is this headache similar?      yes 8. HEAD INJURY: Has there been any recent injury to your head?      no 9. OTHER SYMPTOMS: Do you have any other symptoms? (e.g., fever, stiff neck, eye pain, sore throat, cold symptoms)     Nausea and vomiting  Protocols used: Headache-A-AH

## 2024-03-13 ENCOUNTER — Ambulatory Visit: Admitting: Internal Medicine

## 2024-03-13 ENCOUNTER — Ambulatory Visit: Admitting: Family Medicine

## 2024-03-14 ENCOUNTER — Ambulatory Visit: Admitting: Family Medicine

## 2024-04-02 ENCOUNTER — Other Ambulatory Visit: Payer: Self-pay | Admitting: Family Medicine

## 2024-04-02 ENCOUNTER — Encounter: Payer: Self-pay | Admitting: Family Medicine

## 2024-04-02 ENCOUNTER — Other Ambulatory Visit: Payer: Self-pay | Admitting: Internal Medicine

## 2024-04-02 DIAGNOSIS — G8929 Other chronic pain: Secondary | ICD-10-CM

## 2024-04-02 DIAGNOSIS — M545 Low back pain, unspecified: Secondary | ICD-10-CM

## 2024-04-02 DIAGNOSIS — R112 Nausea with vomiting, unspecified: Secondary | ICD-10-CM

## 2024-04-02 DIAGNOSIS — B370 Candidal stomatitis: Secondary | ICD-10-CM

## 2024-04-04 NOTE — Telephone Encounter (Signed)
 Requested medication (s) are due for refill today: yes  Requested medication (s) are on the active medication list: yes  Last refill:  01/06/24  Future visit scheduled: no  Notes to clinic:  Unable to refill per protocol, cannot delegate.      Requested Prescriptions  Pending Prescriptions Disp Refills   ondansetron  (ZOFRAN -ODT) 4 MG disintegrating tablet [Pharmacy Med Name: ONDANSETRON  ODT 4 MG TABLET] 30 tablet 0    Sig: Take 1 tablet (4 mg total) by mouth every 8 (eight) hours as needed for nausea or vomiting.     Not Delegated - Gastroenterology: Antiemetics - ondansetron  Failed - 04/04/2024 12:13 PM      Failed - This refill cannot be delegated      Failed - AST in normal range and within 360 days    AST  Date Value Ref Range Status  01/06/2024 49 (H) 10 - 35 U/L Final         Failed - ALT in normal range and within 360 days    ALT  Date Value Ref Range Status  01/06/2024 43 (H) 6 - 29 U/L Final         Passed - Valid encounter within last 6 months    Recent Outpatient Visits           2 months ago Functional diarrhea   [place] [facility] [facility] [place], Angeline ORN, NP   5 months ago Bilateral edema of lower extremity   [facility] [place], Marsa PARAS, DO   5 months ago Symptomatic cholelithiasis   [place] [facility] Marsa PARAS, DO   6 months ago Subclavian artery stenosis, right   [facility] [place], Marsa PARAS, [place]

## 2024-04-19 ENCOUNTER — Other Ambulatory Visit: Payer: Self-pay

## 2024-04-19 ENCOUNTER — Encounter: Payer: Self-pay | Admitting: Family Medicine

## 2024-04-19 DIAGNOSIS — I10 Essential (primary) hypertension: Secondary | ICD-10-CM

## 2024-04-19 MED ORDER — LOSARTAN POTASSIUM 25 MG PO TABS: 25.0000 mg | ORAL_TABLET | Freq: Every day | ORAL | 3 refills | Status: AC

## 2024-04-25 ENCOUNTER — Other Ambulatory Visit: Payer: Self-pay | Admitting: Family Medicine

## 2024-04-25 ENCOUNTER — Ambulatory Visit: Admitting: Family Medicine

## 2024-04-25 ENCOUNTER — Other Ambulatory Visit: Payer: Self-pay

## 2024-04-25 ENCOUNTER — Emergency Department
Admission: EM | Admit: 2024-04-25 | Discharge: 2024-04-25 | Disposition: A | Discharge status: Home / Self Care | Source: Ambulatory Visit | Attending: Emergency Medicine | Admitting: Emergency Medicine

## 2024-04-25 ENCOUNTER — Encounter: Payer: Self-pay | Admitting: Emergency Medicine

## 2024-04-25 ENCOUNTER — Emergency Department

## 2024-04-25 VITALS — BP 110/76 | HR 103 | Ht 62.0 in | Wt 107.4 lb

## 2024-04-25 DIAGNOSIS — R110 Nausea: Secondary | ICD-10-CM | POA: Diagnosis not present

## 2024-04-25 DIAGNOSIS — K219 Gastro-esophageal reflux disease without esophagitis: Secondary | ICD-10-CM

## 2024-04-25 DIAGNOSIS — R1013 Epigastric pain: Secondary | ICD-10-CM | POA: Diagnosis present

## 2024-04-25 DIAGNOSIS — K529 Noninfective gastroenteritis and colitis, unspecified: Secondary | ICD-10-CM | POA: Insufficient documentation

## 2024-04-25 DIAGNOSIS — F17210 Nicotine dependence, cigarettes, uncomplicated: Secondary | ICD-10-CM | POA: Insufficient documentation

## 2024-04-25 DIAGNOSIS — K279 Peptic ulcer, site unspecified, unspecified as acute or chronic, without hemorrhage or perforation: Secondary | ICD-10-CM

## 2024-04-25 DIAGNOSIS — E876 Hypokalemia: Secondary | ICD-10-CM | POA: Diagnosis not present

## 2024-04-25 DIAGNOSIS — N63 Unspecified lump in unspecified breast: Secondary | ICD-10-CM | POA: Diagnosis not present

## 2024-04-25 DIAGNOSIS — I10 Essential (primary) hypertension: Secondary | ICD-10-CM

## 2024-04-25 LAB — POC URINE PREG, ED: Preg Test, Ur: NEGATIVE

## 2024-04-25 LAB — COMPREHENSIVE METABOLIC PANEL WITH GFR
ALT: 71 U/L — ABNORMAL HIGH (ref 0–44)
AST: 264 U/L — ABNORMAL HIGH (ref 15–41)
Albumin: 2.7 g/dL — ABNORMAL LOW (ref 3.5–5.0)
Alkaline Phosphatase: 297 U/L — ABNORMAL HIGH (ref 38–126)
Anion gap: 15 (ref 5–15)
BUN: 9 mg/dL (ref 6–20)
CO2: 27 mmol/L (ref 22–32)
Calcium: 7.9 mg/dL — ABNORMAL LOW (ref 8.9–10.3)
Chloride: 95 mmol/L — ABNORMAL LOW (ref 98–111)
Creatinine, Ser: 0.67 mg/dL (ref 0.44–1.00)
GFR, Estimated: >60 mL/min (ref >60)
Glucose, Bld: 118 mg/dL — ABNORMAL HIGH (ref 70–99)
Potassium: 2.8 mmol/L — ABNORMAL LOW (ref 3.5–5.1)
Sodium: 137 mmol/L (ref 135–145)
Total Bilirubin: 1 mg/dL (ref 0.0–1.2)
Total Protein: 5.5 g/dL — ABNORMAL LOW (ref 6.5–8.1)

## 2024-04-25 LAB — CBC
HCT: 39.9 % (ref 36.0–46.0)
Hemoglobin: 14.1 g/dL (ref 12.0–15.0)
MCH: 39.6 pg — ABNORMAL HIGH (ref 26.0–34.0)
MCHC: 35.3 g/dL (ref 30.0–36.0)
MCV: 112.1 fL — ABNORMAL HIGH (ref 80.0–100.0)
Platelets: 312 K/uL (ref 150–400)
RBC: 3.56 MIL/uL — ABNORMAL LOW (ref 3.87–5.11)
RDW: 14.3 % (ref 11.5–15.5)
WBC: 5.7 K/uL (ref 4.0–10.5)
nRBC: 0 % (ref 0.0–0.2)

## 2024-04-25 LAB — URINALYSIS, ROUTINE W REFLEX MICROSCOPIC
Bilirubin Urine: NEGATIVE
Glucose, UA: NEGATIVE mg/dL
Hgb urine dipstick: NEGATIVE
Ketones, ur: NEGATIVE mg/dL
Leukocytes,Ua: NEGATIVE
Nitrite: NEGATIVE
Protein, ur: NEGATIVE mg/dL
Specific Gravity, Urine: 1.021 (ref 1.005–1.030)
pH: 6 (ref 5.0–8.0)

## 2024-04-25 LAB — LIPASE, BLOOD: Lipase: 19 U/L (ref 11–51)

## 2024-04-25 MED ORDER — IOHEXOL 300 MG/ML  SOLN
100.0000 mL | Freq: Once | INTRAMUSCULAR | Status: AC | PRN
  Administered 2024-04-25: 100 mL via INTRAVENOUS

## 2024-04-25 MED ORDER — POLYETHYLENE GLYCOL 3350 17 G PO PACK: 17.0000 g | PACK | Freq: Every day | ORAL | 0 refills | Status: AC

## 2024-04-25 MED ORDER — POTASSIUM CHLORIDE 10 MEQ/100ML IV SOLN
10.0000 meq | Freq: Once | INTRAVENOUS | Status: AC
  Administered 2024-04-25: 10 meq via INTRAVENOUS
  Filled 2024-04-25: qty 100

## 2024-04-25 MED ORDER — MORPHINE SULFATE (PF) 4 MG/ML IV SOLN
4.0000 mg | Freq: Once | INTRAVENOUS | Status: AC
  Administered 2024-04-25: 4 mg via INTRAVENOUS
  Filled 2024-04-25: qty 1

## 2024-04-25 MED ORDER — SUCRALFATE 1 G PO TABS: 1.0000 g | ORAL_TABLET | Freq: Three times a day (TID) | ORAL | 0 refills | Status: AC

## 2024-04-25 MED ORDER — ONDANSETRON HCL 4 MG/2ML IJ SOLN
4.0000 mg | Freq: Once | INTRAMUSCULAR | Status: AC
  Administered 2024-04-25: 4 mg via INTRAVENOUS
  Filled 2024-04-25: qty 2

## 2024-04-25 MED ORDER — PANTOPRAZOLE SODIUM 40 MG PO TBEC: 40.0000 mg | DELAYED_RELEASE_TABLET | Freq: Two times a day (BID) | ORAL | 2 refills | Status: AC

## 2024-04-25 MED ORDER — AMLODIPINE BESYLATE 5 MG PO TABS: 5.0000 mg | ORAL_TABLET | Freq: Every day | ORAL | 0 refills | Status: AC

## 2024-04-25 MED ORDER — AMOXICILLIN-POT CLAVULANATE 875-125 MG PO TABS: 1.0000 | ORAL_TABLET | Freq: Two times a day (BID) | ORAL | 0 refills | Status: AC

## 2024-04-25 NOTE — ED Provider Notes (Signed)
 [facility] Provider Note    Event Date/Time   First MD Initiated Contact with Patient 04/25/24 1103     (approximate)   History   Abdominal Pain  HPI  JAMYAH FOLK is a 48 y.o. female who presents with epigastric abdominal discomfort which has been ongoing for several months but has worsened in the last couple of weeks.  She describes significant nausea and vomiting.  Decreased p.o. intake as well during this time.  Unintentional weight loss.  No fevers or chills.  History of cholecystectomy no other abdominal surgeries.  She does smoke cigarettes, she does use alcohol daily     Physical Exam   Triage Vital Signs: ED Triage Vitals [04/25/24 1100]  Encounter Vitals Group     BP 131/87     Girls Systolic BP Percentile      Girls Diastolic BP Percentile      Boys Systolic BP Percentile      Boys Diastolic BP Percentile      Pulse Rate (!) 101     Resp 18     Temp 98.1 F (36.7 C)     Temp Source Oral     SpO2 98 %     Weight 48.1 kg (106 lb)     Height 1.575 m (5' 2)     Head Circumference      Peak Flow      Pain Score 7     Pain Loc      Pain Education      Exclude from Growth Chart     Most recent vital signs: Vitals:   04/25/24 1135 04/25/24 1436  BP:  (!) 148/66  Pulse:  86  Resp:  15  Temp:  98.1 F (36.7 C)  SpO2: 99% 98%     General: Awake, no distress.  CV:  Good peripheral perfusion.  Resp:  Normal effort.  Abd:  No distention.  Mild epigastric tenderness Other:     ED Results / Procedures / Treatments   Labs (all labs ordered are listed, but only abnormal results are displayed) Labs Reviewed  COMPREHENSIVE METABOLIC PANEL WITH GFR - Abnormal; Notable for the following components:      Result Value   Potassium 2.8 (*)    Chloride 95 (*)    Glucose, Bld 118 (*)    Calcium  7.9 (*)    Total Protein 5.5 (*)    Albumin 2.7 (*)    AST 264 (*)    ALT 71 (*)    Alkaline Phosphatase 297 (*)    All other  components within normal limits  CBC - Abnormal; Notable for the following components:   RBC 3.56 (*)    MCV 112.1 (*)    MCH 39.6 (*)    All other components within normal limits  URINALYSIS, ROUTINE W REFLEX MICROSCOPIC - Abnormal; Notable for the following components:   Color, Urine YELLOW (*)    APPearance HAZY (*)    All other components within normal limits  LIPASE, BLOOD  POC URINE PREG, ED     EKG     RADIOLOGY CT abdomen pelvis pending    PROCEDURES:  Critical Care performed:   Procedures   MEDICATIONS ORDERED IN ED: Medications  morphine  (PF) 4 MG/ML injection 4 mg (4 mg Intravenous Given 04/25/24 1156)  ondansetron  (ZOFRAN ) injection 4 mg (4 mg Intravenous Given 04/25/24 1156)  iohexol  (OMNIPAQUE ) 300 MG/ML solution 100 mL (100 mLs Intravenous Contrast Given 04/25/24 1245)  potassium chloride  10 mEq in 100 mL IVPB (0 mEq Intravenous Stopped 04/25/24 1414)     IMPRESSION / MDM / ASSESSMENT AND PLAN / ED COURSE  I reviewed the triage vital signs and the nursing notes. Patient's presentation is most consistent with acute presentation with potential threat to life or bodily function.  Patient presents with epigastric pain as detailed above, she does use alcohol and cigarettes.  Differential includes gastritis, PUD, pancreatitis, colitis, malignancy  Will treat with IV Zofran , IV morphine , obtain CT abdomen pelvis  Lab work reviewed, she has mild elevations in LFTs, not unexpected with daily alcohol usage.  Mild hypokalemia noted likely from nausea vomiting.  This was repleted  CT scan suggestive of infectious colitis, discussed this with the patient at length including incidental findings of breast lesion and possible early cirrhosis of the liver.  Alcohol cessation counseling provided.  We will trial a course of antibiotics, she also reports frequent constipation which could be related to her oxycodone  use.  Recommend daily MiraLAX , very close follow-up with  PCP, strict return precautions, no indication for admission at this time, she agrees to this plan.  She does have GI follow-up already arranged        FINAL CLINICAL IMPRESSION(S) / ED DIAGNOSES   Final diagnoses:  Colitis  Breast mass in female     Rx / DC Orders   ED Discharge Orders          Ordered    polyethylene glycol (MIRALAX ) 17 g packet  Daily        04/25/24 1427    amoxicillin-clavulanate (AUGMENTIN) 875-125 MG tablet  2 times daily        04/25/24 1427             Note:  This document was prepared using Dragon voice recognition software and may include unintentional dictation errors.   Arlander [place], MD 04/25/24 [phone]

## 2024-04-25 NOTE — Progress Notes (Unsigned)
 Subjective:    Patient ID: EARLY ORD, female    DOB: [birth year], 48 y.o.   MRN: [id number]  Joanna Garcia is a 48 y.o. female presenting on 04/25/2024 for Medical Management of Chronic Issues   HPI  Discussed the use of AI scribe software for clinical note transcription with the patient, who gave verbal consent to proceed.  History of Present Illness   Joanna Garcia is a 48 year old female who presents with chronic nausea, abdominal pain, and constipation.  Nausea and reduced appetite - Persistent nausea for several months - Significantly reduced appetite, unable to tolerate most foods - Nausea and inability to eat are new symptoms compared to prior history  Abdominal pain and gastrointestinal symptoms - Chronic abdominal pain localized to the mid-abdomen - Pain is alleviated by hunching over - Excessive burping without apparent trigger - Symptoms are different from those experienced after gallbladder surgery in April  Constipation - Severe constipation ongoing for years - Requires medication to have bowel movements - Can go up to a week without a bowel movement - Takes Opioid therapy  Medication use - Current medications include nausea medication, pain medicine, losartan , and stomach acid medication (taken once daily) - Requests refill for amlodipine  - Takes baby aspirin  - No use of anti-inflammatories or other medications that could exacerbate gastrointestinal symptoms     History Chronic cholecystitis with gallstones S/p laparoscopic robotic cholecystectomy 09/2023       04/25/2024   10:07 AM 01/06/2024    8:45 AM 11/02/2023    8:29 AM  Depression screen PHQ 2/9  Decreased Interest 0 0 0  Down, Depressed, Hopeless 0 0 0  PHQ - 2 Score 0 0 0  Altered sleeping  0   Tired, decreased energy  0   Change in appetite  0   Feeling bad or failure about yourself   0   Trouble concentrating  0   Moving slowly or fidgety/restless  0   Suicidal  thoughts  0   PHQ-9 Score  0   Difficult doing work/chores  Not difficult at all        04/25/2024   10:07 AM 01/06/2024    8:46 AM 11/02/2023    8:29 AM 10/20/2023    8:47 AM  GAD 7 : Generalized Anxiety Score  Nervous, Anxious, on Edge 0 0 0 0  Control/stop worrying 0 0 0 0  Worry too much - different things 0 0 0 0  Trouble relaxing 0 0 0 0  Restless 0 0 0 0  Easily annoyed or irritable 0 0 0 0  Afraid - awful might happen 0 0 0 0  Total GAD 7 Score 0 0 0 0  Anxiety Difficulty  Not difficult at all      Social History   Tobacco Use   Smoking status: Every Day    Current packs/day: 0.75    Average packs/day: 0.8 packs/day for 31.8 years (23.9 ttl pk-yrs)    Types: Cigarettes    Start date: 06/21/1992   Smokeless tobacco: Never  Vaping Use   Vaping status: Never Used  Substance Use Topics   Alcohol use: Yes    Comment: 24 Oz daily   Drug use: Not Currently    Types: Marijuana    Review of Systems Per HPI unless specifically indicated above     Objective:    BP 110/76 (BP Location: Left Arm, Patient Position: Sitting, Cuff Size: Normal)   Pulse ROLLEN)  103   Ht 5' 2 (1.575 m)   Wt 107 lb 6 oz (48.7 kg)   SpO2 98%   BMI 19.64 kg/m   Wt Readings from Last 3 Encounters:  04/25/24 106 lb (48.1 kg)  04/25/24 107 lb 6 oz (48.7 kg)  01/06/24 113 lb 3.2 oz (51.3 kg)    Physical Exam Vitals and nursing note reviewed.  Constitutional:      General: She is not in acute distress.    Appearance: She is well-developed. She is not diaphoretic.     Comments: Uncomfortable, cooperative  HENT:     Head: Normocephalic and atraumatic.  Eyes:     General:        Right eye: No discharge.        Left eye: No discharge.     Conjunctiva/sclera: Conjunctivae normal.  Neck:     Thyroid : No thyromegaly.  Cardiovascular:     Rate and Rhythm: Normal rate and regular rhythm.     Heart sounds: Normal heart sounds. No murmur heard. Pulmonary:     Effort: Pulmonary effort is normal.  No respiratory distress.     Breath sounds: Normal breath sounds. No wheezing or rales.  Abdominal:     General: There is no distension.     Tenderness: There is abdominal tenderness (epigastric). There is no guarding or rebound.     Hernia: No hernia is present.     Comments: Slow bowel sounds  Musculoskeletal:        General: Normal range of motion.     Cervical back: Normal range of motion and neck supple.  Lymphadenopathy:     Cervical: No cervical adenopathy.  Skin:    General: Skin is warm and dry.     Findings: No erythema or rash.  Neurological:     Mental Status: She is alert and oriented to person, place, and time.  Psychiatric:        Behavior: Behavior normal.     Comments: Well groomed, good eye contact, normal speech and thoughts     Results for orders placed or performed in visit on 01/06/24  CBC   Collection Time: 01/06/24  8:47 AM  Result Value Ref Range   WBC 7.3 3.8 - 10.8 Thousand/uL   RBC 3.68 (L) 3.80 - 5.10 Million/uL   Hemoglobin 13.6 11.7 - 15.5 g/dL   HCT 58.9 64.9 - 54.9 %   MCV 111.4 (H) 80.0 - 100.0 fL   MCH 37.0 (H) 27.0 - 33.0 pg   MCHC 33.2 32.0 - 36.0 g/dL   RDW 86.4 88.9 - 84.9 %   Platelets 181 140 - 400 Thousand/uL   MPV 12.2 7.5 - 12.5 fL  Hepatic function panel   Collection Time: 01/06/24  8:47 AM  Result Value Ref Range   Total Protein 5.3 (L) 6.1 - 8.1 g/dL   Albumin 3.4 (L) 3.6 - 5.1 g/dL   Globulin 1.9 1.9 - 3.7 g/dL (calc)   AG Ratio 1.8 1.0 - 2.5 (calc)   Total Bilirubin 0.6 0.2 - 1.2 mg/dL   Bilirubin, Direct 0.2 0.0 - 0.2 mg/dL   Indirect Bilirubin 0.4 0.2 - 1.2 mg/dL (calc)   Alkaline phosphatase (APISO) 203 (H) 31 - 125 U/L   AST 49 (H) 10 - 35 U/L   ALT 43 (H) 6 - 29 U/L      Assessment & Plan:   Problem List Items Addressed This Visit     GERD (gastroesophageal reflux disease) (Chronic)  Relevant Medications   pantoprazole  (PROTONIX ) 40 MG tablet   sucralfate (CARAFATE) 1 g tablet   Other Visit Diagnoses        PUD (peptic ulcer disease)    -  Primary   Relevant Medications   sucralfate (CARAFATE) 1 g tablet     Nausea         Epigastric abdominal pain            Suspected Peptic ulcer disease with chronic nausea, epigastric pain, and gastroesophageal reflux Chronic nausea, epigastric pain, and gastroesophageal reflux with worsening symptoms.  Symptoms present for months now, difficulty with onset course through history.  S/p cholecystectomy 09/2023, seems similar onset She does consume alcohol regularly and is on chronic opioids Chronic constipation is another factor for her symptoms  Differential includes post-cholecystectomy complications, pancreatitis, opioid-induced constipation, and peptic ulcer disease.  - Increased PPI from daily to Pantoprazole  40mg  TWICE A DAY - Prescribed Carafate for symptomatic relief. - Advised emergency room visit for further evaluation at this time given severity of her symptoms, pain, nausea, concerns with hydration and constellation of symptoms at this time. Considered outpatient imaging work up but she is requesting ED evaluation for further expedited management  - Provided work note for absence due to treatment and GI appointment upcoming 06/19/24       No orders of the defined types were placed in this encounter.   Meds ordered this encounter  Medications   pantoprazole  (PROTONIX ) 40 MG tablet    Sig: Take 1 tablet (40 mg total) by mouth 2 (two) times daily.    Dispense:  60 tablet    Refill:  2    Dose increase   sucralfate (CARAFATE) 1 g tablet    Sig: Take 1 tablet (1 g total) by mouth 4 (four) times daily -  with meals and at bedtime. As needed for abdominal pain    Dispense:  60 tablet    Refill:  0    Follow up plan: Return if symptoms worsen or fail to improve.   Marsa Officer, DO [facility] 04/25/2024, 9:48 AM

## 2024-04-25 NOTE — Patient Instructions (Addendum)
 Thank you for coming to the office today.  Okay to go directly to [facility] ED for abdominal pain symptoms and further testing evaluation.  We can follow-up further after results and contact your GI specialist going forward to see if can get you in sooner.  Double dose Pantoprazole  40mg  twice a day before meals  Add carafate for symptom relief with meals or bedtime  We will see if we can adjust the apt for GI soon if possible  Please schedule a Follow-up Appointment to: Return if symptoms worsen or fail to improve.  If you have any other questions or concerns, please feel free to call the office or send a message through MyChart. You may also schedule an earlier appointment if necessary.  Additionally, you may be receiving a survey about your experience at our office within a few days to 1 week by e-mail or mail. We value your feedback.  Marsa Officer, DO [facility], [place]

## 2024-04-25 NOTE — ED Triage Notes (Signed)
 Patient to ED via POV for generalized abd pain with nausea. Ongoing for months per pt but getting worse.

## 2024-04-26 ENCOUNTER — Encounter: Payer: Self-pay | Admitting: Family Medicine

## 2024-04-27 ENCOUNTER — Encounter: Payer: Self-pay | Admitting: Family Medicine

## 2024-04-27 ENCOUNTER — Telehealth: Payer: Self-pay

## 2024-04-27 DIAGNOSIS — N6323 Unspecified lump in the left breast, lower outer quadrant: Secondary | ICD-10-CM

## 2024-04-27 NOTE — Telephone Encounter (Signed)
 Duplicate messages. She was checking status of other message. Closing this encounter.

## 2024-04-27 NOTE — Addendum Note (Signed)
 Addended by: EDMAN MARSA PARAS on: 04/27/2024 01:50 PM   Modules accepted: Orders

## 2024-04-27 NOTE — Telephone Encounter (Signed)
 Copied from CRM #[id number]. Topic: Referral - Question >> Apr 27, 2024  1:23 PM Nathanel BROCKS wrote: Reason for CRM: pt went to the Er on Wednesday and it was for something else but they did a ct scan and it showed a mass on her left breast. Please schedule an appt for her to have a mammogram

## 2024-04-27 NOTE — Telephone Encounter (Signed)
Please refer to previous message

## 2024-04-27 NOTE — Telephone Encounter (Signed)
 Copied from CRM #[id number]. Topic: Appointments - Scheduling Inquiry for Clinic >> Apr 27, 2024 12:07 PM Wess RAMAN wrote: Reason for CRM: Patient said she has a mass on her left breast and needs to schedule a mammagram, per the providers at [facility] #: [id number]

## 2024-04-27 NOTE — Telephone Encounter (Signed)
 Spoke with patient, notified her that her order for a mammogram and ultrasound were in she can call and schedule with [facility] at [phone].

## 2024-04-30 ENCOUNTER — Other Ambulatory Visit: Payer: Self-pay | Admitting: Family Medicine

## 2024-04-30 ENCOUNTER — Encounter: Payer: Self-pay | Admitting: Family Medicine

## 2024-04-30 DIAGNOSIS — M545 Low back pain, unspecified: Secondary | ICD-10-CM

## 2024-04-30 DIAGNOSIS — R112 Nausea with vomiting, unspecified: Secondary | ICD-10-CM

## 2024-04-30 DIAGNOSIS — G8929 Other chronic pain: Secondary | ICD-10-CM

## 2024-04-30 MED ORDER — OXYCODONE-ACETAMINOPHEN 5-325 MG PO TABS: 1.0000 | ORAL_TABLET | ORAL | 0 refills | Status: DC | PRN

## 2024-05-01 ENCOUNTER — Ambulatory Visit (INDEPENDENT_AMBULATORY_CARE_PROVIDER_SITE_OTHER): Admitting: Family Medicine

## 2024-05-01 ENCOUNTER — Encounter: Payer: Self-pay | Admitting: Family Medicine

## 2024-05-01 VITALS — BP 110/76 | HR 95 | Ht 62.0 in | Wt 108.1 lb

## 2024-05-01 DIAGNOSIS — K219 Gastro-esophageal reflux disease without esophagitis: Secondary | ICD-10-CM

## 2024-05-01 DIAGNOSIS — R112 Nausea with vomiting, unspecified: Secondary | ICD-10-CM | POA: Diagnosis not present

## 2024-05-01 DIAGNOSIS — K529 Noninfective gastroenteritis and colitis, unspecified: Secondary | ICD-10-CM

## 2024-05-01 DIAGNOSIS — K279 Peptic ulcer, site unspecified, unspecified as acute or chronic, without hemorrhage or perforation: Secondary | ICD-10-CM | POA: Diagnosis not present

## 2024-05-01 DIAGNOSIS — R1013 Epigastric pain: Secondary | ICD-10-CM

## 2024-05-01 DIAGNOSIS — F5104 Psychophysiologic insomnia: Secondary | ICD-10-CM

## 2024-05-01 MED ORDER — BUSPIRONE HCL 10 MG PO TABS: 10.0000 mg | ORAL_TABLET | Freq: Two times a day (BID) | ORAL | 0 refills | Status: DC | PRN

## 2024-05-01 NOTE — Progress Notes (Signed)
 Subjective:    Patient ID: Joanna Garcia, female    DOB: April 03, 1976, 48 y.o.   MRN: [id number]  Joanna Garcia is a 48 y.o. female presenting on 05/01/2024 for Hospitalization Follow-up   HPI  Discussed the use of AI scribe software for clinical note transcription with the patient, who gave verbal consent to proceed.  History of Present Illness   [name] is a 48 year old female who presents with digestive symptoms for follow-up after an ER visit.   ED FOLLOW-UP VISIT  Hospital/Location: [facility] Date of ED Visit: 04/25/24  Reason for Presenting to ED: Nausea Abdominal Pain  FOLLOW-UP  - ED provider note and record have been reviewed - Patient presents today about 6 days after recent ED visit.   Gastrointestinal symptoms - Chronic abdominal pain, nausea, difficulty eating, and long-term constipation for over six months, now worsening with ED visit on 04/25/24 - Symptoms persist despite increased stomach acid medication and addition of sucralfate - Imaging during recent ER visit showed possible colitis and patchy areas of wall thickening - Received IV medications for pain, nausea, and antibiotics in ER without improvement in symptoms - Relies on peppermint candy and nausea pills for symptom management - Continues on Zofran , has tried antacid, on PPI high dose already - Difficulty maintaining adequate nutrition due to lack of appetite and nausea; attempting nutritional supplements such as Boost and Ensure - Currently completing a course of antibiotics with one day remaining  Back pain - Uses pain medication for chronic back pain, which provides relief - Does not believe pain medication contributes to nausea  Alcohol use - Previously consumed four twelve-ounce flavored wine beverages daily. These helped her go to sleep on 3rd shift. - Stopped alcohol intake since previous Friday - No withdrawal symptoms reported  Sleep disturbance - Works third shift -  Struggles with insomnia and difficulty initiating sleep   - New medications on discharge: Augmentin - Changes to current meds on discharge: None    I have reviewed the discharge medication list, and have reconciled the current and discharge medications today.   Current Outpatient Medications:    amLODipine  (NORVASC ) 5 MG tablet, Take 1 tablet (5 mg total) by mouth daily., Disp: 90 tablet, Rfl: 0   amoxicillin-clavulanate (AUGMENTIN) 875-125 MG tablet, Take 1 tablet by mouth 2 (two) times daily for 7 days., Disp: 14 tablet, Rfl: 0   aspirin  81 MG chewable tablet, Chew and swallow 1 tablet (81 mg total) by mouth daily., Disp: 30 tablet, Rfl: 2   atorvastatin  (LIPITOR ) 80 MG tablet, Take 1 tablet (80 mg total) by mouth daily., Disp: 90 tablet, Rfl: 3   busPIRone (BUSPAR) 10 MG tablet, Take 1 tablet (10 mg total) by mouth 2 (two) times daily as needed (anxiety insomnia)., Disp: 30 tablet, Rfl: 0   clopidogrel  (PLAVIX ) 75 MG tablet, Take 1 tablet (75 mg total) by mouth daily., Disp: 90 tablet, Rfl: 3   cyanocobalamin  (VITAMIN B12) 1000 MCG/ML injection, Inject 1 mL (1,000 mcg total) into the muscle every 30 (thirty) days. For 4 months, Disp: 4 mL, Rfl: 0   fluconazole  (DIFLUCAN ) 150 MG tablet, Take 1 tablet (150 mg total) by mouth every other day., Disp: 7 tablet, Rfl: 0   furosemide  (LASIX ) 20 MG tablet, Take 1 tablet (20 mg total) by mouth daily as needed., Disp: 30 tablet, Rfl: 0   gabapentin  (NEURONTIN ) 300 MG capsule, Take 3 caps = 900mg  dose in morning and afternoon and take  4 caps = 1200mg  in evening. Max dose is 10 capsules in 24 hours., Disp: 900 capsule, Rfl: 1   losartan  (COZAAR ) 25 MG tablet, Take 1 tablet (25 mg total) by mouth daily., Disp: 90 tablet, Rfl: 3   ondansetron  (ZOFRAN -ODT) 4 MG disintegrating tablet, Take 1 tablet (4 mg total) by mouth every 8 (eight) hours as needed for nausea or vomiting., Disp: 30 tablet, Rfl: 2   oxyCODONE -acetaminophen  (PERCOCET/ROXICET) 5-325 MG  tablet, Take 1 tablet by mouth every 4 (four) hours as needed for severe pain (pain score 7-10). Max 4 per day, Disp: 120 tablet, Rfl: 0   pantoprazole  (PROTONIX ) 40 MG tablet, Take 1 tablet (40 mg total) by mouth 2 (two) times daily., Disp: 60 tablet, Rfl: 2   polyethylene glycol (MIRALAX ) 17 g packet, Take 17 g by mouth daily., Disp: 14 each, Rfl: 0   sucralfate (CARAFATE) 1 g tablet, Take 1 tablet (1 g total) by mouth 4 (four) times daily -  with meals and at bedtime. As needed for abdominal pain, Disp: 60 tablet, Rfl: 0   tiZANidine  (ZANAFLEX ) 4 MG tablet, TAKE ONE TABLET EVERY 8 HOURS AS NEEDED FOR MUSCLE SPASM., Disp: 90 tablet, Rfl: 2  ------------------------------------------------------------------------- Social History   Tobacco Use   Smoking status: Every Day    Current packs/day: 0.75    Average packs/day: 0.8 packs/day for 31.9 years (23.9 ttl pk-yrs)    Types: Cigarettes    Start date: 06/21/1992   Smokeless tobacco: Never  Vaping Use   Vaping status: Never Used  Substance Use Topics   Alcohol use: Yes    Comment: 24 Oz daily   Drug use: Not Currently    Types: Marijuana    Review of Systems Per HPI unless specifically indicated above     Objective:    BP 110/76 (BP Location: Right Arm, Patient Position: Sitting, Cuff Size: Normal)   Pulse 95   Ht 5' 2 (1.575 m)   Wt 108 lb 2 oz (49 kg)   SpO2 99%   BMI 19.78 kg/m   Wt Readings from Last 3 Encounters:  05/01/24 108 lb 2 oz (49 kg)  04/25/24 106 lb (48.1 kg)  04/25/24 107 lb 6 oz (48.7 kg)    Physical Exam Vitals and nursing note reviewed.  Constitutional:      General: She is not in acute distress.    Appearance: She is well-developed. She is not diaphoretic.     Comments: Uncomfortable, cooperative  HENT:     Head: Normocephalic and atraumatic.  Eyes:     General:        Right eye: No discharge.        Left eye: No discharge.     Conjunctiva/sclera: Conjunctivae normal.  Neck:     Thyroid : No  thyromegaly.  Cardiovascular:     Rate and Rhythm: Normal rate and regular rhythm.     Heart sounds: Normal heart sounds. No murmur heard. Pulmonary:     Effort: Pulmonary effort is normal. No respiratory distress.     Breath sounds: Normal breath sounds. No wheezing or rales.  Abdominal:     General: There is no distension.     Tenderness: There is abdominal tenderness (epigastric). There is no guarding or rebound.     Hernia: No hernia is present.     Comments: Slow bowel sounds  Musculoskeletal:        General: Normal range of motion.     Cervical back: Normal range of  motion and neck supple.  Lymphadenopathy:     Cervical: No cervical adenopathy.  Skin:    General: Skin is warm and dry.     Findings: No erythema or rash.  Neurological:     Mental Status: She is alert and oriented to person, place, and time.  Psychiatric:        Behavior: Behavior normal.     Comments: Well groomed, good eye contact, normal speech and thoughts     I have personally reviewed the radiology report from 04/25/24 on CT Abdomen Pelvis.  CLINICAL DATA:  Upper abdominal pain and weight loss.   EXAM: CT ABDOMEN AND PELVIS WITH CONTRAST   TECHNIQUE: Multidetector CT imaging of the abdomen and pelvis was performed using the standard protocol following bolus administration of intravenous contrast.   RADIATION DOSE REDUCTION: This exam was performed according to the departmental dose-optimization program which includes automated exposure control, adjustment of the mA and/or kV according to patient size and/or use of iterative reconstruction technique.   CONTRAST:  OMNIPAQUE  IOHEXOL  300 MG/ML  SOLN   COMPARISON:  MRI abdomen 10/13/2023   FINDINGS: Lower chest: Heart is normal size. Minimal calcified plaque over the descending thoracic aorta. Visualized lung bases are clear. Possible oval 1.9 cm mass over the outer lower left breast.   Hepatobiliary: Liver demonstrates subtle diffuse  low attenuation likely due to a degree of steatosis. There is no focal liver mass. Gallbladder not visualized. Biliary tree is normal.   Pancreas: Normal.   Spleen: Normal.   Adrenals/Urinary Tract: Adrenal glands are normal. Kidneys are normal in size without hydronephrosis or nephrolithiasis. 1.4 cm cyst over the mid pole left kidney. Ureters and bladder are normal.   Stomach/Bowel: Stomach and small bowel are normal. Appendix is normal. Patchy areas of wall thickening/submucosal edema involving the colon without significant adjacent inflammation or fluid as findings can be seen due to an acute colitis.   Vascular/Lymphatic: Mild calcified plaque over the abdominal aorta which is normal in caliber. Stent over the proximal left renal artery. Remaining vascular structures are unremarkable. No adenopathy.   Reproductive: Uterus and bilateral adnexa are unremarkable.   Other: Small amount of free fluid in the cul-de-sac.   Musculoskeletal: No focal abnormality.   IMPRESSION: 1. Patchy areas of wall thickening/submucosal edema involving the colon without significant adjacent inflammation or fluid. Findings can be seen due to an acute colitis of infectious or inflammatory nature and less likely ischemic. 2. Subtle diffuse low attenuation of the liver likely due to a degree of steatosis. 3. 1.4 cm left renal cyst. 4. Possible oval 1.9 cm mass over the outer lower left breast. Recommend correlation with diagnostic mammogram and possible ultrasound. 5. Aortic atherosclerosis.   Aortic Atherosclerosis (ICD10-I70.0).     Electronically Signed   By: Toribio Agreste M.D.   On: 04/25/2024 13:51  Results for orders placed or performed during the hospital encounter of 04/25/24  Lipase, blood   Collection Time: 04/25/24 11:02 AM  Result Value Ref Range   Lipase 19 11 - 51 U/L  Comprehensive metabolic panel   Collection Time: 04/25/24 11:02 AM  Result Value Ref Range   Sodium  137 135 - 145 mmol/L   Potassium 2.8 (L) 3.5 - 5.1 mmol/L   Chloride 95 (L) 98 - 111 mmol/L   CO2 27 22 - 32 mmol/L   Glucose, Bld 118 (H) 70 - 99 mg/dL   BUN 9 6 - 20 mg/dL   Creatinine, Ser 9.32 0.44 -  1.00 mg/dL   Calcium  7.9 (L) 8.9 - 10.3 mg/dL   Total Protein 5.5 (L) 6.5 - 8.1 g/dL   Albumin 2.7 (L) 3.5 - 5.0 g/dL   AST 735 (H) 15 - 41 U/L   ALT 71 (H) 0 - 44 U/L   Alkaline Phosphatase 297 (H) 38 - 126 U/L   Total Bilirubin 1.0 0.0 - 1.2 mg/dL   GFR, Estimated >39 >39 mL/min   Anion gap 15 5 - 15  CBC   Collection Time: 04/25/24 11:02 AM  Result Value Ref Range   WBC 5.7 4.0 - 10.5 K/uL   RBC 3.56 (L) 3.87 - 5.11 MIL/uL   Hemoglobin 14.1 12.0 - 15.0 g/dL   HCT 60.0 63.9 - 53.9 %   MCV 112.1 (H) 80.0 - 100.0 fL   MCH 39.6 (H) 26.0 - 34.0 pg   MCHC 35.3 30.0 - 36.0 g/dL   RDW 85.6 88.4 - 84.4 %   Platelets 312 150 - 400 K/uL   nRBC 0.0 0.0 - 0.2 %  Urinalysis, Routine w reflex microscopic -Urine, Clean Catch   Collection Time: 04/25/24 12:30 PM  Result Value Ref Range   Color, Urine YELLOW (A) YELLOW   APPearance HAZY (A) CLEAR   Specific Gravity, Urine 1.021 1.005 - 1.030   pH 6.0 5.0 - 8.0   Glucose, UA NEGATIVE NEGATIVE mg/dL   Hgb urine dipstick NEGATIVE NEGATIVE   Bilirubin Urine NEGATIVE NEGATIVE   Ketones, ur NEGATIVE NEGATIVE mg/dL   Protein, ur NEGATIVE NEGATIVE mg/dL   Nitrite NEGATIVE NEGATIVE   Leukocytes,Ua NEGATIVE NEGATIVE  POC urine preg, ED   Collection Time: 04/25/24 12:34 PM  Result Value Ref Range   Preg Test, Ur NEGATIVE NEGATIVE      Assessment & Plan:   Problem List Items Addressed This Visit     GERD (gastroesophageal reflux disease) (Chronic)   Other Visit Diagnoses       Nausea and vomiting, unspecified vomiting type    -  Primary     PUD (peptic ulcer disease)         Epigastric abdominal pain         Psychophysiological insomnia       Relevant Medications   busPIRone (BUSPAR) 10 MG tablet       ED Follow-up  Chronic  abdominal pain with nausea, vomiting, and chronic constipation Persistent symptoms over six months.  Recent visit with and ED visit on 04/25/24, she had broader work up with CT abdomen imaging showed Colitis, treated with pain control nausea control and antibiotic Augmentin course.  No improvement by her report today. She is managing symptoms with medications but not resolving. She has upcoming GI specialist visit on 06/[facility] at [facility]  Differential includes colitis. Pain medications may contribute to nausea. Alcohol cessation may alleviate symptoms. - Continue current medications for nausea and pain management. - Finish current course of antibiotics. - I advised her that this point I am running out of suggestions to help manage this problem, and she has already been to ED and on max therapy I have, I advised her that I would contact our referral coordinator to see if we can try urgent referral to an expedited GI referral for further evaluation, including potential colonoscopy and biopsy of colitis in future  Colitis, unspecified CT scan suggests colitis with patchy wall thickening. Further evaluation needed to determine if inflammatory or infectious. - Expedited GI referral for further evaluation, including potential colonoscopy and biopsy.  Gastroesophageal  reflux disease and peptic ulcer disease Symptoms may be exacerbated by alcohol use. Current management includes increased stomach acid medication and sucralfate. - Continue current medications for GERD and peptic ulcer disease. - Advised reduction of alcohol intake to manage symptoms.  I do think the alcohol use is contributing factor to her chronic nausea and abdominal symptoms.  Insomnia related to shift work and alcohol cessation Difficulty sleeping due to shift work and recent alcohol cessation. No withdrawal symptoms reported. - Prescribed Buspar 10 mg, take half or whole tablet as needed for anxiety or sleep, up to three  times a day. - Advised on potential short-term use of Benadryl  for sleep, but not as a long-term solution.  Alcohol use, currently reducing Currently reducing alcohol intake. Alcohol may have contributed to digestive symptoms. - Continue reducing alcohol intake. - Monitor for any withdrawal symptoms.        Route chart / message to [facility] for referral coordinator to try to expedite GI referral for more urgent status, after patient was seen in hospital ED,  to see if we can get her in sooner.   Meds ordered this encounter  Medications   busPIRone (BUSPAR) 10 MG tablet    Sig: Take 1 tablet (10 mg total) by mouth 2 (two) times daily as needed (anxiety insomnia).    Dispense:  30 tablet    Refill:  0    Follow up plan: Return if symptoms worsen or fail to improve.  Marsa Officer, DO [place] [facility] 05/01/2024, 9:47 AM

## 2024-05-01 NOTE — Patient Instructions (Addendum)
 Thank you for coming to the office today.  Try the Buspar for insomnia, helps calm the mind reduce anxiety and thoughts active mind at night, take whole or half tab as needed.  Keep reducing the alcohol as this can help manage some digestive symptoms, it could have flared some nausea or gastritis.  Colitis on the CT scan, probably needs further testing / colonoscopy by GI  We will try to expedite the referral and contact [facility] to see if we can get you in sooner.  Please schedule a Follow-up Appointment to: Return if symptoms worsen or fail to improve.  If you have any other questions or concerns, please feel free to call the office or send a message through MyChart. You may also schedule an earlier appointment if necessary.  Additionally, you may be receiving a survey about your experience at our office within a few days to 1 week by e-mail or mail. We value your feedback.  Marsa Officer, DO [facility], [place]

## 2024-05-02 ENCOUNTER — Ambulatory Visit
Admission: RE | Admit: 2024-05-02 | Discharge: 2024-05-02 | Disposition: A | Discharge status: Home / Self Care | Source: Ambulatory Visit | Attending: Family Medicine | Admitting: Family Medicine

## 2024-05-02 ENCOUNTER — Telehealth: Payer: Self-pay

## 2024-05-02 ENCOUNTER — Other Ambulatory Visit: Payer: Self-pay | Admitting: Family Medicine

## 2024-05-02 ENCOUNTER — Encounter: Payer: Self-pay | Admitting: Radiology

## 2024-05-02 DIAGNOSIS — N6323 Unspecified lump in the left breast, lower outer quadrant: Secondary | ICD-10-CM | POA: Diagnosis present

## 2024-05-02 DIAGNOSIS — N6489 Other specified disorders of breast: Secondary | ICD-10-CM

## 2024-05-02 DIAGNOSIS — R928 Other abnormal and inconclusive findings on diagnostic imaging of breast: Secondary | ICD-10-CM

## 2024-05-02 DIAGNOSIS — R921 Mammographic calcification found on diagnostic imaging of breast: Secondary | ICD-10-CM

## 2024-05-02 NOTE — Telephone Encounter (Signed)
 Duplicate request  Order signed

## 2024-05-02 NOTE — Telephone Encounter (Signed)
 I have co-signed the R Breast US  order that I received just now.  If you can let them know - if they need a call back. Thanks  Marsa Officer, DO [facility] 05/02/2024, 9:59 AM

## 2024-05-02 NOTE — Telephone Encounter (Signed)
 [place] [facility]) need right breast ultrasound. Need order signed.  Please refer to message sent to Dr Edman

## 2024-05-02 NOTE — Addendum Note (Signed)
 Addended by: EDMAN MARSA PARAS on: 05/02/2024 11:00 AM   Modules accepted: Orders

## 2024-05-02 NOTE — Telephone Encounter (Signed)
 Copied from CRM #[id number]. Topic: Clinical - Request for Lab/Test Order >> May 02, 2024  9:48 AM Hadassah PARAS wrote: Reason for CRM: Pt is currently undergoing medical testing and the tech is needing verbal authorization to perform ultrasound on right breast as well as there are masses there. Please advise [phone]. Transferred to [facility]

## 2024-05-03 ENCOUNTER — Ambulatory Visit
Admission: RE | Admit: 2024-05-03 | Discharge: 2024-05-03 | Disposition: A | Discharge status: Home / Self Care | Source: Ambulatory Visit | Attending: Family Medicine | Admitting: Family Medicine

## 2024-05-03 ENCOUNTER — Ambulatory Visit
Admission: RE | Admit: 2024-05-03 | Discharge: 2024-05-03 | Disposition: A | Source: Ambulatory Visit | Attending: Family Medicine | Admitting: Family Medicine

## 2024-05-03 ENCOUNTER — Encounter: Payer: Self-pay | Admitting: Oncology

## 2024-05-03 DIAGNOSIS — R921 Mammographic calcification found on diagnostic imaging of breast: Secondary | ICD-10-CM | POA: Insufficient documentation

## 2024-05-03 DIAGNOSIS — N6031 Fibrosclerosis of right breast: Secondary | ICD-10-CM | POA: Diagnosis present

## 2024-05-03 DIAGNOSIS — R928 Other abnormal and inconclusive findings on diagnostic imaging of breast: Secondary | ICD-10-CM

## 2024-05-03 DIAGNOSIS — D242 Benign neoplasm of left breast: Secondary | ICD-10-CM | POA: Insufficient documentation

## 2024-05-03 HISTORY — PX: BREAST BIOPSY: SHX20

## 2024-05-03 MED ORDER — LIDOCAINE-EPINEPHRINE 1 %-1:100000 IJ SOLN
10.0000 mL | Freq: Once | INTRAMUSCULAR | Status: AC
  Administered 2024-05-03: 10 mL
  Filled 2024-05-03: qty 10

## 2024-05-03 MED ORDER — LIDOCAINE-EPINEPHRINE 1 %-1:100000 IJ SOLN
5.0000 mL | Freq: Once | INTRAMUSCULAR | Status: AC
  Administered 2024-05-03: 5 mL

## 2024-05-03 MED ORDER — LIDOCAINE 1 % OPTIME INJ - NO CHARGE
5.0000 mL | Freq: Once | INTRAMUSCULAR | Status: AC
  Administered 2024-05-03: 5 mL
  Filled 2024-05-03: qty 6

## 2024-05-03 MED ORDER — LIDOCAINE 1 % OPTIME INJ - NO CHARGE
1.0000 mL | Freq: Once | INTRAMUSCULAR | Status: AC
  Administered 2024-05-03: 1 mL
  Filled 2024-05-03: qty 2

## 2024-05-03 MED ORDER — LIDOCAINE HCL (PF) 1 % IJ SOLN
5.0000 mL | Freq: Once | INTRAMUSCULAR | Status: AC
  Administered 2024-05-03: 5 mL
  Filled 2024-05-03: qty 5

## 2024-05-04 LAB — SURGICAL PATHOLOGY

## 2024-05-07 ENCOUNTER — Encounter: Payer: Self-pay | Admitting: *Deleted

## 2024-05-07 ENCOUNTER — Other Ambulatory Visit: Payer: Self-pay | Admitting: Family Medicine

## 2024-05-07 DIAGNOSIS — B370 Candidal stomatitis: Secondary | ICD-10-CM

## 2024-05-07 NOTE — Progress Notes (Signed)
 Referral recieved from [facility] for benign breast mass. She will see Dr. Cesar on Tuesday 11/25 at 10:00.

## 2024-05-08 NOTE — Telephone Encounter (Signed)
 Requested medications are due for refill today.  unsure  Requested medications are on the active medications list.  yes  Last refill. 04/02/2024 #7 0 rf  Future visit scheduled.   no  Notes to clinic.  Medication not assigned to a protocol. Please review for refill    Requested Prescriptions  Pending Prescriptions Disp Refills   fluconazole  (DIFLUCAN ) 150 MG tablet [Pharmacy Med Name: FLUCONAZOLE  150 MG TABLET] 7 tablet 0    Sig: Take 1 tablet (150 mg total) by mouth every other day.     Off-Protocol Failed - 05/08/2024  5:08 PM      Failed - Medication not assigned to a protocol, review manually.      Passed - Valid encounter within last 12 months    Recent Outpatient Visits           1 week ago Nausea and vomiting, unspecified vomiting type   [place] [facility] [place], Marsa PARAS, DO   1 week ago PUD (peptic ulcer disease)   [place] [facility] [place] Marsa PARAS, DO   4 months ago Functional diarrhea   [place] [facility] [place], Angeline ORN, NP   6 months ago Bilateral edema of lower extremity   [place] [facility] [place] Marsa PARAS, DO   6 months ago Symptomatic cholelithiasis   [place] [facility] [place], Marsa PARAS, [place]

## 2024-05-10 ENCOUNTER — Other Ambulatory Visit: Payer: Self-pay | Admitting: Family Medicine

## 2024-05-10 DIAGNOSIS — F5104 Psychophysiologic insomnia: Secondary | ICD-10-CM

## 2024-05-11 ENCOUNTER — Encounter: Payer: Self-pay | Admitting: Oncology

## 2024-05-11 NOTE — Telephone Encounter (Signed)
 Requested medications are due for refill today.  unsure  Requested medications are on the active medications list.  yes  Last refill. 05/01/2024 #30 0 rf  Future visit scheduled.   no  Notes to clinic.  New medication to this pt. If pt takes 2 a day they will be out in a few days.    Requested Prescriptions  Pending Prescriptions Disp Refills   busPIRone  (BUSPAR ) 10 MG tablet [Pharmacy Med Name: BUSPIRONE  HCL 10 MG TABLET] 30 tablet 0    Sig: Take 1 tablet (10 mg total) by mouth 2 (two) times daily as needed (anxiety insomnia).     Psychiatry: Anxiolytics/Hypnotics - Non-controlled Passed - 05/11/2024  5:19 PM      Passed - Valid encounter within last 12 months    Recent Outpatient Visits           1 week ago Nausea and vomiting, unspecified vomiting type   [place] [facility] [place] Marsa PARAS, DO   2 weeks ago PUD (peptic ulcer disease)   [place] [facility] [place] Marsa PARAS, DO   4 months ago Functional diarrhea   [place] [facility] [place], Angeline ORN, NP   6 months ago Bilateral edema of lower extremity   [place] [facility] [place] Marsa PARAS, DO   6 months ago Symptomatic cholelithiasis   [place], Marsa PARAS, [place]

## 2024-05-14 ENCOUNTER — Encounter: Payer: Self-pay | Admitting: Gastroenterology

## 2024-05-14 ENCOUNTER — Encounter: Payer: Self-pay | Admitting: Family Medicine

## 2024-05-14 ENCOUNTER — Other Ambulatory Visit: Payer: Self-pay | Admitting: Family Medicine

## 2024-05-14 DIAGNOSIS — F5104 Psychophysiologic insomnia: Secondary | ICD-10-CM

## 2024-05-15 NOTE — Telephone Encounter (Signed)
 Rx 05/14/24 #30- duplicate request Requested Prescriptions  Pending Prescriptions Disp Refills   busPIRone  (BUSPAR ) 10 MG tablet [Pharmacy Med Name: BUSPIRONE  HCL 10 MG TABLET] 30 tablet 0    Sig: Take 1 tablet (10 mg total) by mouth 2 (two) times daily as needed (anxiety insomnia).     Psychiatry: Anxiolytics/Hypnotics - Non-controlled Passed - 05/15/2024 11:30 AM      Passed - Valid encounter within last 12 months    Recent Outpatient Visits           2 weeks ago Nausea and vomiting, unspecified vomiting type   [place] [facility] [place], Marsa PARAS, DO   2 weeks ago PUD (peptic ulcer disease)   [place] [facility] [place] Marsa PARAS, DO   4 months ago Functional diarrhea   [place] [facility] [place], Angeline ORN, NP   6 months ago Bilateral edema of lower extremity   [place] [facility] [place] Marsa PARAS, DO   6 months ago Symptomatic cholelithiasis   [place] [facility] [place], Marsa PARAS, [place]

## 2024-05-18 ENCOUNTER — Encounter: Payer: Self-pay | Admitting: Oncology

## 2024-05-21 ENCOUNTER — Encounter: Payer: Self-pay | Admitting: Oncology

## 2024-05-23 ENCOUNTER — Encounter: Payer: Self-pay | Admitting: Oncology

## 2024-05-25 ENCOUNTER — Encounter: Admission: RE | Disposition: A | Payer: Self-pay | Source: Home / Self Care | Attending: Gastroenterology

## 2024-05-25 ENCOUNTER — Ambulatory Visit
Admission: RE | Admit: 2024-05-25 | Discharge: 2024-05-25 | Disposition: A | Attending: Gastroenterology | Admitting: Gastroenterology

## 2024-05-25 ENCOUNTER — Encounter: Payer: Self-pay | Admitting: Gastroenterology

## 2024-05-25 ENCOUNTER — Other Ambulatory Visit: Payer: Self-pay | Admitting: Family Medicine

## 2024-05-25 ENCOUNTER — Encounter: Payer: Self-pay | Admitting: Family Medicine

## 2024-05-25 ENCOUNTER — Encounter

## 2024-05-25 ENCOUNTER — Ambulatory Visit

## 2024-05-25 DIAGNOSIS — G8929 Other chronic pain: Secondary | ICD-10-CM

## 2024-05-25 DIAGNOSIS — M545 Low back pain, unspecified: Secondary | ICD-10-CM

## 2024-05-25 DIAGNOSIS — F5104 Psychophysiologic insomnia: Secondary | ICD-10-CM

## 2024-05-25 HISTORY — PX: ESOPHAGOGASTRODUODENOSCOPY: SHX5428

## 2024-05-25 SURGERY — EGD (ESOPHAGOGASTRODUODENOSCOPY)
Anesthesia: General

## 2024-05-25 MED ORDER — MIDAZOLAM HCL (PF) 2 MG/2ML IJ SOLN
INTRAMUSCULAR | Status: DC | PRN
  Administered 2024-05-25: 2 mg via INTRAVENOUS

## 2024-05-25 MED ORDER — SODIUM CHLORIDE 0.9 % IV SOLN: INTRAVENOUS | Status: DC

## 2024-05-25 MED ORDER — PROPOFOL 500 MG/50ML IV EMUL
INTRAVENOUS | Status: DC | PRN
  Administered 2024-05-25: 80 mg via INTRAVENOUS
  Administered 2024-05-25: 125 ug/kg/min via INTRAVENOUS

## 2024-05-25 MED ORDER — OXYCODONE-ACETAMINOPHEN 5-325 MG PO TABS: 1.0000 | ORAL_TABLET | ORAL | 0 refills | Status: DC | PRN

## 2024-05-25 MED ORDER — BUSPIRONE HCL 10 MG PO TABS: 10.0000 mg | ORAL_TABLET | Freq: Two times a day (BID) | ORAL | 2 refills | Status: AC | PRN

## 2024-05-25 MED ORDER — MIDAZOLAM HCL 2 MG/2ML IJ SOLN
INTRAMUSCULAR | Status: AC
  Filled 2024-05-25: qty 2

## 2024-05-25 MED ORDER — LIDOCAINE HCL (CARDIAC) PF 100 MG/5ML IV SOSY
PREFILLED_SYRINGE | INTRAVENOUS | Status: DC | PRN
  Administered 2024-05-25: 50 mg via INTRAVENOUS

## 2024-05-25 NOTE — Interval H&P Note (Signed)
 History and Physical Interval Note: Preprocedure H&P from 05/25/24  was reviewed and there was no interval change after seeing and examining the patient.  Written consent was obtained from the patient after discussion of risks, benefits, and alternatives. Patient has consented to proceed with Esophagogastroduodenoscopy with possible intervention   05/25/2024 10:16 AM  Joanna Garcia  has presented today for surgery, with the diagnosis of Elevated LFTs  Nausea  Weight Loss Epigastric pain.  The various methods of treatment have been discussed with the patient and family. After consideration of risks, benefits and other options for treatment, the patient has consented to  Procedure(s): EGD (ESOPHAGOGASTRODUODENOSCOPY) (N/A) as a surgical intervention.  The patient's history has been reviewed, patient examined, no change in status, stable for surgery.  I have reviewed the patient's chart and labs.  Questions were answered to the patient's satisfaction.     [name]

## 2024-05-25 NOTE — Op Note (Signed)
 [facility] Patient Name: Joanna Garcia Procedure Date: 05/25/2024 10:00 AM MRN: [id number] Account #: [id number] Date of Birth: Jun 04, 1976 Admit Type: Outpatient Age: 48 Room: [facility] ENDO ROOM 1 Gender: Female Note Status: Finalized Instrument Name: Endoscope [id number] Procedure:             Upper GI endoscopy Indications:           Abnormal LFTs, epigastric pain, nausea Providers:             Elspeth Ozell Onita ROSALEA, DO Referring MD:          Marsa DOROTHA Officer (Referring MD) Medicines:             Monitored Anesthesia Care Complications:         No immediate complications. Estimated blood loss:                         Minimal. Procedure:             Pre-Anesthesia Assessment:                        - Prior to the procedure, a History and Physical was                         performed, and patient medications and allergies were                         reviewed. The patient is competent. The risks and                         benefits of the procedure and the sedation options and                         risks were discussed with the patient. All questions                         were answered and informed consent was obtained.                         Patient identification and proposed procedure were                         verified by the physician, the nurse, the anesthetist                         and the technician in the endoscopy suite. Mental                         Status Examination: alert and oriented. Airway                         Examination: normal oropharyngeal airway and neck                         mobility. Respiratory Examination: clear to                         auscultation. CV Examination: RRR, no murmurs, no S3  or S4. Prophylactic Antibiotics: The patient does not                         require prophylactic antibiotics. Prior                         Anticoagulants: The patient has taken Plavix                           (clopidogrel ), last dose was 6 days prior to                         procedure. ASA Grade Assessment: II - A patient with                         mild systemic disease. After reviewing the risks and                         benefits, the patient was deemed in satisfactory                         condition to undergo the procedure. The anesthesia                         plan was to use monitored anesthesia care (MAC).                         Immediately prior to administration of medications,                         the patient was re-assessed for adequacy to receive                         sedatives. The heart rate, respiratory rate, oxygen                         saturations, blood pressure, adequacy of pulmonary                         ventilation, and response to care were monitored                         throughout the procedure. The physical status of the                         patient was re-assessed after the procedure.                        After obtaining informed consent, the endoscope was                         passed under direct vision. Throughout the procedure,                         the patient's blood pressure, pulse, and oxygen                         saturations were monitored continuously. The Endoscope  was introduced through the mouth, and advanced to the                         third part of duodenum. The upper GI endoscopy was                         accomplished without difficulty. The patient tolerated                         the procedure well. Findings:      The duodenal bulb, first portion of the duodenum and second portion of       the duodenum were normal. Biopsies for histology were taken with a cold       forceps for evaluation of celiac disease. Estimated blood loss was       minimal.      The Z-line was regular. Estimated blood loss: none.      Esophagogastric landmarks were identified: the gastroesophageal  junction       was found at 35 cm from the incisors.      The examined esophagus was normal. Estimated blood loss: none.      One non-bleeding healing gastric ulcer was found in the gastric antrum.       Ulcer healing and distorting the antrum/pylorus. Biopsies were taken       with a cold forceps for histology.      A small hiatal hernia was present. Estimated blood loss: none.      The gastric fundus and gastric body were normal. Biopsies were taken       with a cold forceps for Helicobacter pylori testing. Estimated blood       loss was minimal.      The exam of the stomach was otherwise normal. Impression:            - Normal duodenal bulb, first portion of the duodenum                         and second portion of the duodenum. Biopsied.                        - Z-line regular.                        - Esophagogastric landmarks identified.                        - Normal esophagus.                        - Non-bleeding gastric ulcer. Biopsied.                        - Small hiatal hernia.                        - Normal gastric fundus and gastric body. Biopsied. Recommendation:        - Patient has a contact number available for                         emergencies. The signs and symptoms of potential  delayed complications were discussed with the patient.                         Return to normal activities tomorrow. Written                         discharge instructions were provided to the patient.                        - Discharge patient to home.                        - Resume previous diet.                        - Continue present medications.                        - Await pathology results.                        - Return to GI clinic as previously scheduled.                        - Discontinue non-steroidal anti inflammatories and                         avoid alcohol products                        Continue twice a day proton pump inhibitor                         Continue with anti-nausea medication                        Continue miralax  for constipation                        Resume plavix  tomorrow                        Proceed with colonoscopy- to be scheduled                        - The findings and recommendations were discussed with                         the patient. Procedure Code(s):     --- Professional ---                        [phone], Esophagogastroduodenoscopy, flexible,                         transoral; with biopsy, single or multiple Diagnosis Code(s):     --- Professional ---                        K25.9, Gastric ulcer, unspecified as acute or chronic,                         without hemorrhage or perforation  K44.9, Diaphragmatic hernia without obstruction or                         gangrene CPT copyright 2022 American Medical Association. All rights reserved. The codes documented in this report are preliminary and upon coder review may  be revised to meet current compliance requirements. Attending Participation:      I personally performed the entire procedure. Elspeth Jungling, DO [name] DO, DO 05/25/2024 10:42:59 AM This report has been signed electronically. Number of Addenda: 0 Note Initiated On: 05/25/2024 10:00 AM Estimated Blood Loss:  Estimated blood loss was minimal.      [facility]

## 2024-05-25 NOTE — Transfer of Care (Signed)
 Immediate Anesthesia Transfer of Care Note  Patient: Joanna Garcia  Procedure(s) Performed: EGD (ESOPHAGOGASTRODUODENOSCOPY)  Patient Location: Endoscopy Unit  Anesthesia Type:General  Level of Consciousness: awake, alert , and oriented  Airway & Oxygen Therapy: Patient Spontanous Breathing  Post-op Assessment: Report given to RN and Post -op Vital signs reviewed and stable  Post vital signs: Reviewed and stable  Last Vitals:  Vitals Value Taken Time  BP 99/59 05/25/24 10:32  Temp 36 C 05/25/24 10:32  Pulse 91 05/25/24 10:33  Resp 14 05/25/24 10:33  SpO2 100 % 05/25/24 10:33  Vitals shown include unfiled device data.  Last Pain:  Vitals:   05/25/24 1032  TempSrc: Temporal  PainSc: 0-No pain         Complications: No notable events documented.

## 2024-05-25 NOTE — Anesthesia Procedure Notes (Signed)
 Date/Time: 05/25/2024 10:18 AM  Performed by: Dominica Krabbe, CRNAPre-anesthesia Checklist: Patient identified, Emergency Drugs available, Suction available, Patient being monitored and Timeout performed Patient Re-evaluated:Patient Re-evaluated prior to induction Oxygen Delivery Method: Nasal cannula Preoxygenation: Pre-oxygenation with 100% oxygen Induction Type: IV induction

## 2024-05-25 NOTE — H&P (Addendum)
 Pre-Procedure H&P   Patient ID: Joanna Garcia is a 48 y.o. female.  Gastroenterology Provider: [name], DO  Referring Provider: Romero Antigua, PA PCP: Edman Marsa PARAS, DO  Date: 05/25/2024  HPI [name] is a 48 y.o. female who presents today for Esophagogastroduodenoscopy for Nausea, epigastric pain, increased LFTs .  Since spring of 2025- pt has had n/v 4x/w. No dysphagia/odynophagia Constipation having 1 bm per week w/o melena or hematochezia  Last egd and csy 02/2019- diverticulosis and esophagitis  Currently on bid protonix   Plavix  held for procedure (last dose 11/29)  Past Medical History:  Diagnosis Date   Anemia    DVT (deep venous thrombosis) (HCC)    GERD (gastroesophageal reflux disease)    Hypertension    Polycythemia    Polyneuropathy    Stroke [facility])     Past Surgical History:  Procedure Laterality Date   BREAST BIOPSY Right 05/03/2024   US  RT BREAST BX W LOC DEV 1ST LESION IMG BX SPEC US  GUIDE 05/03/2024 [facility]   BREAST BIOPSY Right 05/03/2024   MM RT BREAST BX W LOC DEV EA AD LESION IMG BX SPEC STEREO GUIDE 05/03/2024 [facility]   BREAST BIOPSY Left 05/03/2024   MM LT BREAST BX W LOC DEV 1ST LESION IMAGE BX SPEC STEREO GUIDE 05/03/2024 [facility]   CAROTID ANGIOGRAPHY Bilateral 07/28/2023   Procedure: CAROTID ANGIOGRAPHY;  Surgeon: Marea Selinda RAMAN, MD;  Location: [facility] INVASIVE CV LAB;  Service: Cardiovascular;  Laterality: Bilateral;   COLONOSCOPY N/A 03/16/2019   Procedure: COLONOSCOPY;  Surgeon: Toledo, Ladell POUR, MD;  Location: [facility] ENDOSCOPY;  Service: Gastroenterology;  Laterality: N/A;   ESOPHAGOGASTRODUODENOSCOPY N/A 03/16/2019   Procedure: ESOPHAGOGASTRODUODENOSCOPY (EGD);  Surgeon: Toledo, Ladell POUR, MD;  Location: [facility] ENDOSCOPY;  Service: Gastroenterology;  Laterality: N/A;   INDOCYANINE GREEN  FLUORESCENCE IMAGING (ICG)  10/13/2023   Procedure: INDOCYANINE GREEN  FLUORESCENCE IMAGING  (ICG);  Surgeon: Rodolph Romano, MD;  Location: [facility] ORS;  Service: General;;   IR ANGIO INTRA EXTRACRAN SEL COM CAROTID INNOMINATE UNI L MOD SED  02/09/2021   IR INTRAVSC STENT CERV CAROTID W/EMB-PROT MOD SED INCL ANGIO  02/09/2021   IR PERCUTANEOUS ART THROMBECTOMY/INFUSION INTRACRANIAL INC DIAG ANGIO  02/09/2021   IR US  GUIDE VASC ACCESS RIGHT  02/09/2021   NO PAST SURGERIES     RADIOLOGY WITH ANESTHESIA N/A 02/09/2021   Procedure: IR WITH ANESTHESIA;  Surgeon: Radiologist, Medication, MD;  Location: [facility] OR;  Service: Radiology;  Laterality: N/A;   RENAL ANGIOGRAPHY Left 01/24/2023   Procedure: RENAL ANGIOGRAPHY;  Surgeon: Marea Selinda RAMAN, MD;  Location: [facility] INVAS[facility] CV LAB;  Service: Cardiovascular;  Laterality: Left;    Family History No h/o GI disease or malignancy  Review of Systems  Constitutional:  Positive for appetite change and unexpected weight change. Negative for activity change, chills, diaphoresis, fatigue and fever.  HENT:  Negative for trouble swallowing and voice change.   Respiratory:  Negative for shortness of breath and wheezing.   Cardiovascular:  Negative for chest pain, palpitations and leg swelling.  Gastrointestinal:  Positive for abdominal pain, constipation and nausea. Negative for abdominal distention, anal bleeding, blood in stool, diarrhea, rectal pain and vomiting.  Musculoskeletal:  Negative for arthralgias and myalgias.  Skin:  Negative for color change and pallor.  Neurological:  Negative for dizziness, syncope and weakness.  Psychiatric/Behavioral:  Negative for confusion.   All other systems reviewed and are negative.    Medications No current facility-administered medications on file prior to  encounter.   Current Outpatient Medications on File Prior to Encounter  Medication Sig Dispense Refill   amLODipine  (NORVASC ) 5 MG tablet Take 1 tablet (5 mg total) by mouth daily. 90 tablet 0   atorvastatin  (LIPITOR ) 80 MG tablet Take 1 tablet (80 mg  total) by mouth daily. 90 tablet 3   gabapentin  (NEURONTIN ) 300 MG capsule Take 3 caps = 900mg  dose in morning and afternoon and take 4 caps = 1200mg  in evening. Max dose is 10 capsules in 24 hours. 900 capsule 1   losartan  (COZAAR ) 25 MG tablet Take 1 tablet (25 mg total) by mouth daily. 90 tablet 3   oxyCODONE -acetaminophen  (PERCOCET/ROXICET) 5-325 MG tablet Take 1 tablet by mouth every 4 (four) hours as needed for severe pain (pain score 7-10). Max 4 per day 120 tablet 0   pantoprazole  (PROTONIX ) 40 MG tablet Take 1 tablet (40 mg total) by mouth 2 (two) times daily. 60 tablet 2   aspirin  81 MG chewable tablet Chew and swallow 1 tablet (81 mg total) by mouth daily. 30 tablet 2   busPIRone  (BUSPAR ) 10 MG tablet Take 1 tablet (10 mg total) by mouth 2 (two) times daily as needed (anxiety insomnia). 30 tablet 0   clopidogrel  (PLAVIX ) 75 MG tablet Take 1 tablet (75 mg total) by mouth daily. 90 tablet 3   cyanocobalamin  (VITAMIN B12) 1000 MCG/ML injection Inject 1 mL (1,000 mcg total) into the muscle every 30 (thirty) days. For 4 months 4 mL 0   fluconazole  (DIFLUCAN ) 150 MG tablet Take 1 tablet (150 mg total) by mouth every other day. 7 tablet 0   furosemide  (LASIX ) 20 MG tablet Take 1 tablet (20 mg total) by mouth daily as needed. 30 tablet 0   ondansetron  (ZOFRAN -ODT) 4 MG disintegrating tablet Take 1 tablet (4 mg total) by mouth every 8 (eight) hours as needed for nausea or vomiting. 30 tablet 2   polyethylene glycol (MIRALAX ) 17 g packet Take 17 g by mouth daily. 14 each 0   sucralfate  (CARAFATE ) 1 g tablet Take 1 tablet (1 g total) by mouth 4 (four) times daily -  with meals and at bedtime. As needed for abdominal pain 60 tablet 0   tiZANidine  (ZANAFLEX ) 4 MG tablet TAKE ONE TABLET EVERY 8 HOURS AS NEEDED FOR MUSCLE SPASM. 90 tablet 2    Pertinent medications related to GI and procedure were reviewed by me with the patient prior to the procedure   Current Facility-Administered Medications:     0.9 %  sodium chloride  infusion, , Intravenous, Continuous, Onita Elspeth Sharper, DO  sodium chloride          No Known Allergies Allergies were reviewed by me prior to the procedure  Objective   Body mass index is 20.27 kg/m. Vitals:   05/25/24 0933 05/25/24 0936  BP: 108/67   Pulse: 86   Resp: 16   Temp: (!) 97.3 F (36.3 C)   TempSrc: Temporal   SpO2: 98%   Weight:  50.3 kg  Height: 5' 2 (1.575 m)      Physical Exam Vitals and nursing note reviewed.  Constitutional:      General: She is not in acute distress.    Appearance: Normal appearance. She is not ill-appearing, toxic-appearing or diaphoretic.  HENT:     Head: Normocephalic and atraumatic.     Nose: Nose normal.     Mouth/Throat:     Mouth: Mucous membranes are moist.     Pharynx: Oropharynx is clear.  Eyes:     General: No scleral icterus.    Extraocular Movements: Extraocular movements intact.  Cardiovascular:     Rate and Rhythm: Normal rate and regular rhythm.     Heart sounds: Normal heart sounds. No murmur heard.    No friction rub. No gallop.  Pulmonary:     Effort: Pulmonary effort is normal. No respiratory distress.     Breath sounds: Normal breath sounds. No wheezing, rhonchi or rales.  Abdominal:     General: Bowel sounds are normal. There is no distension.     Palpations: Abdomen is soft.     Tenderness: There is no abdominal tenderness. There is no guarding or rebound.  Musculoskeletal:     Cervical back: Neck supple.     Right lower leg: No edema.     Left lower leg: No edema.  Skin:    General: Skin is warm and dry.     Coloration: Skin is not jaundiced or pale.  Neurological:     General: No focal deficit present.     Mental Status: She is alert and oriented to person, place, and time. Mental status is at baseline.  Psychiatric:        Mood and Affect: Mood normal.        Behavior: Behavior normal.        Thought Content: Thought content normal.        Judgment: Judgment  normal.      Assessment:  [name] is a 48 y.o. female  who presents today for Esophagogastroduodenoscopy for Nausea, epigastric pain, increased LFTs .  Plan:  Esophagogastroduodenoscopy with possible intervention today  Esophagogastroduodenoscopy with possible biopsy, control of bleeding, polypectomy, and interventions as necessary has been discussed with the patient/patient representative. Informed consent was obtained from the patient/patient representative after explaining the indication, nature, and risks of the procedure including but not limited to death, bleeding, perforation, missed neoplasm/lesions, cardiorespiratory compromise, and reaction to medications. Opportunity for questions was given and appropriate answers were provided. Patient/patient representative has verbalized understanding is amenable to undergoing the procedure.   [name], DO  [facility]  Portions of the record may have been created with voice recognition software. Occasional wrong-word or 'sound-a-like' substitutions may have occurred due to the inherent limitations of voice recognition software.  Read the chart carefully and recognize, using context, where substitutions may have occurred.

## 2024-05-25 NOTE — Anesthesia Postprocedure Evaluation (Signed)
 Anesthesia Post Note  Patient: Joanna Garcia  Procedure(s) Performed: EGD (ESOPHAGOGASTRODUODENOSCOPY)  Patient location during evaluation: Endoscopy Anesthesia Type: General Level of consciousness: awake and alert Pain management: pain level controlled Vital Signs Assessment: post-procedure vital signs reviewed and stable Respiratory status: spontaneous breathing, nonlabored ventilation, respiratory function stable and patient connected to nasal cannula oxygen Cardiovascular status: blood pressure returned to baseline and stable Postop Assessment: no apparent nausea or vomiting Anesthetic complications: no   No notable events documented.   Last Vitals:  Vitals:   05/25/24 1039 05/25/24 1042  BP:  (!) 101/59  Pulse: 95 99  Resp: 14 13  Temp:    SpO2: 94% 99%    Last Pain:  Vitals:   05/25/24 1042  TempSrc:   PainSc: 0-No pain                 Prentice Murphy

## 2024-05-25 NOTE — Anesthesia Preprocedure Evaluation (Signed)
 Anesthesia Evaluation  Patient identified by MRN, date of birth, ID band Patient awake    Reviewed: Allergy & Precautions, H&P , NPO status , Patient's Chart, lab work & pertinent test results, reviewed documented beta blocker date and time   History of Anesthesia Complications Negative for: history of anesthetic complications  Airway Mallampati: II  TM Distance: >3 FB Neck ROM: full    Dental  (+) Dental Advidsory Given, Edentulous Upper, Upper Dentures   Pulmonary neg shortness of breath, neg COPD, neg recent URI, Current Smoker and Patient abstained from smoking.   Pulmonary exam normal        Cardiovascular Exercise Tolerance: Poor hypertension, On Medications (-) angina + Peripheral Vascular Disease  (-) Past MI and (-) Cardiac Stents Normal cardiovascular exam(-) dysrhythmias (-) Valvular Problems/Murmurs Rhythm:regular Rate:Normal     Neuro/Psych neg Seizures  Neuromuscular disease CVA, No Residual Symptoms negative neurological ROS  negative psych ROS   GI/Hepatic negative GI ROS, Neg liver ROS,GERD  Medicated,,  Endo/Other  negative endocrine ROS    Renal/GU negative Renal ROS  negative genitourinary   Musculoskeletal  (+) Arthritis ,    Abdominal   Peds  Hematology negative hematology ROS (+) Blood dyscrasia, anemia   Anesthesia Other Findings Past Medical History: No date: DVT (deep venous thrombosis) (HCC) No date: GERD (gastroesophageal reflux disease) No date: Hypertension No date: Polycythemia No date: Polyneuropathy No date: Stroke [facility] Past Surgical History: 07/28/2023: CAROTID ANGIOGRAPHY; Bilateral     Comment:  Procedure: CAROTID ANGIOGRAPHY;  Surgeon: Marea Selinda RAMAN,               MD;  Location: [facility] CV LAB;  Service:               Cardiovascular;  Laterality: Bilateral; 03/16/2019: COLONOSCOPY; N/A     Comment:  Procedure: COLONOSCOPY;  Surgeon: Toledo, Ladell POUR, MD;               Location: [facility] ENDOSCOPY;  Service: Gastroenterology;                Laterality: N/A; 03/16/2019: ESOPHAGOGASTRODUODENOSCOPY; N/A     Comment:  Procedure: ESOPHAGOGASTRODUODENOSCOPY (EGD);  Surgeon:               Toledo, Ladell POUR, MD;  Location: [facility] ENDOSCOPY;                Service: Gastroenterology;  Laterality: N/A; 02/09/2021: IR ANGIO INTRA EXTRACRAN SEL COM CAROTID INNOMINATE UNI L  MOD SED 02/09/2021: IR INTRAVSC STENT CERV CAROTID W/EMB-PROT MOD SED INCL  ANGIO 02/09/2021: IR PERCUTANEOUS ART THROMBECTOMY/INFUSION INTRACRANIAL  INC DIAG ANGIO 02/09/2021: IR US  GUIDE VASC ACCESS RIGHT No date: NO PAST SURGERIES 02/09/2021: RADIOLOGY WITH ANESTHESIA; N/A     Comment:  Procedure: IR WITH ANESTHESIA;  Surgeon: Radiologist,               Medication, MD;  Location: [facility];  Service: Radiology;                Laterality: N/A; 01/24/2023: RENAL ANGIOGRAPHY; Left     Comment:  Procedure: RENAL ANGIOGRAPHY;  Surgeon: Marea Selinda RAMAN,               MD;  Location: [facility] CV LAB;  Service:               Cardiovascular;  Laterality: Left; BMI    Body Mass Index: 21.40 kg/m     Reproductive/Obstetrics negative OB ROS  Anesthesia Physical Anesthesia Plan  ASA: 3  Anesthesia Plan: General   Post-op Pain Management:    Induction: Intravenous  PONV Risk Score and Plan: 3 and Propofol  infusion, TIVA and Treatment may vary due to age or medical condition  Airway Management Planned: Natural Airway and Nasal Cannula  Additional Equipment:   Intra-op Plan:   Post-operative Plan:   Informed Consent: I have reviewed the patients History and Physical, chart, labs and discussed the procedure including the risks, benefits and alternatives for the proposed anesthesia with the patient or authorized representative who has indicated his/her understanding and acceptance.     Dental Advisory Given  Plan Discussed with:  CRNA  Anesthesia Plan Comments:          Anesthesia Quick Evaluation

## 2024-05-28 NOTE — Telephone Encounter (Signed)
 Requested medication (s) are due for refill today - no  Requested medication (s) are on the active medication list -yes  Future visit scheduled -no  Last refill: 05/28/24 #120  Notes to clinic: non delegated Rx  Requested Prescriptions  Pending Prescriptions Disp Refills   oxyCODONE -acetaminophen  (PERCOCET/ROXICET) 5-325 MG tablet [Pharmacy Med Name: OXYCOD/APAP 5/325 MG] 120 tablet 0    Sig: Take 1 tablet by mouth every 4 (four) hours as needed for severe pain (pain score 7-10). Max 4 per day     Not Delegated - Analgesics:  Opioid Agonist Combinations Failed - 05/28/2024  3:13 PM      Failed - This refill cannot be delegated      Failed - Urine Drug Screen completed in last 360 days      Passed - Valid encounter within last 3 months    Recent Outpatient Visits           3 weeks ago Nausea and vomiting, unspecified vomiting type   [place] [facility] Ed[place] Marsa PARAS, DO   1 month ago PUD (peptic ulcer disease)   [place] [facility] Ed[place] Marsa PARAS, DO   4 months ago Functional diarrhea   [place] [facility] [place], [place] W, NP   6 months ago Bilateral edema of lower extremity   [place] [facility] Ed[place] Marsa PARAS, DO   7 months ago Symptomatic cholelithiasis   [place] [facility] [place], Marsa PARAS, DO              Refused Prescriptions Disp Refills   busPIRone  (BUSPAR ) 10 MG tablet [Pharmacy Med Name: BUSPIRONE  HCL 10 MG TABLET] 30 tablet 0    Sig: Take 1 tablet (10 mg total) by mouth 2 (two) times daily as needed (anxiety insomnia).     Psychiatry: Anxiolytics/Hypnotics - Non-controlled Passed - 05/28/2024  3:13 PM      Passed - Valid encounter within last 12 months    Recent Outpatient Visits           3 weeks ago Nausea and vomiting, unspecified vomiting type   [place] [facility], Marsa PARAS, DO    1 month ago PUD (peptic ulcer disease)   [place] [facility] Ed[place] Marsa PARAS, DO   4 months ago Functional diarrhea   [place] [facility] [place], Angeline ORN, NP   6 months ago Bilateral edema of lower extremity   [place] [facility] Ed[place] Marsa PARAS, DO   7 months ago Symptomatic cholelithiasis   [place] [facility] Ed[place] Marsa PARAS, DO                 Requested Prescriptions  Pending Prescriptions Disp Refills   oxyCODONE -acetaminophen  (PERCOCET/ROXICET) 5-325 MG tablet [Pharmacy Med Name: OXYCOD/APAP 5/325 MG] 120 tablet 0    Sig: Take 1 tablet by mouth every 4 (four) hours as needed for severe pain (pain score 7-10). Max 4 per day     Not Delegated - Analgesics:  Opioid Agonist Combinations Failed - 05/28/2024  3:13 PM      Failed - This refill cannot be delegated      Failed - Urine Drug Screen completed in last 360 days      Passed - Valid encounter within last 3 months    Recent Outpatient Visits  3 weeks ago Nausea and vomiting, unspecified vomiting type   [place] [facility] [place], Marsa PARAS, DO   1 month ago PUD (peptic ulcer disease)   [place] [facility] [place], Marsa PARAS, DO   4 months ago Functional diarrhea   [place] [facility] [place], [place] W, NP   6 months ago Bilateral edema of lower extremity   [place] [facility] Ed[place] Marsa PARAS, DO   7 months ago Symptomatic cholelithiasis   [place] [facility] [place], Marsa PARAS, DO              Refused Prescriptions Disp Refills   busPIRone  (BUSPAR ) 10 MG tablet [Pharmacy Med Name: BUSPIRONE  HCL 10 MG TABLET] 30 tablet 0    Sig: Take 1 tablet (10 mg total) by mouth 2 (two) times daily as needed (anxiety insomnia).     Psychiatry: Anxiolytics/Hypnotics - Non-controlled  Passed - 05/28/2024  3:13 PM      Passed - Valid encounter within last 12 months    Recent Outpatient Visits           3 weeks ago Nausea and vomiting, unspecified vomiting type   [place] [facility] Ed[place] Marsa PARAS, DO   1 month ago PUD (peptic ulcer disease)   [place] [facility] Ed[place] Marsa PARAS, DO   4 months ago Functional diarrhea   [place] [facility] [place], Angeline ORN, NP   6 months ago Bilateral edema of lower extremity   [place] [facility] Ed[place] Marsa PARAS, DO   7 months ago Symptomatic cholelithiasis   [facility] [place], Marsa PARAS, [place]

## 2024-05-28 NOTE — Telephone Encounter (Signed)
 Rx 05/25/24 #60 2RF- too soon Requested Prescriptions  Pending Prescriptions Disp Refills   oxyCODONE -acetaminophen  (PERCOCET/ROXICET) 5-325 MG tablet [Pharmacy Med Name: OXYCOD/APAP 5/325 MG] 120 tablet 0    Sig: Take 1 tablet by mouth every 4 (four) hours as needed for severe pain (pain score 7-10). Max 4 per day     Not Delegated - Analgesics:  Opioid Agonist Combinations Failed - 05/28/2024  3:13 PM      Failed - This refill cannot be delegated      Failed - Urine Drug Screen completed in last 360 days      Passed - Valid encounter within last 3 months    Recent Outpatient Visits           3 weeks ago Nausea and vomiting, unspecified vomiting type   [place] [facility] Ed[facility] Marsa PARAS, DO   1 month ago PUD (peptic ulcer disease)   [place] [facility] Edman Marsa PARAS, DO   4 months ago Functional diarrhea   [employer] [facility] [place], [place], NP   6 months ago Bilateral edema of lower extremity   [place] [facility] Ed[place] Marsa PARAS, DO   7 months ago Symptomatic cholelithiasis   [place] [facility] [place], Marsa PARAS, DO              Refused Prescriptions Disp Refills   busPIRone  (BUSPAR ) 10 MG tablet [Pharmacy Med Name: BUSPIRONE  HCL 10 MG TABLET] 30 tablet 0    Sig: Take 1 tablet (10 mg total) by mouth 2 (two) times daily as needed (anxiety insomnia).     Psychiatry: Anxiolytics/Hypnotics - Non-controlled Passed - 05/28/2024  3:13 PM      Passed - Valid encounter within last 12 months    Recent Outpatient Visits           3 weeks ago Nausea and vomiting, unspecified vomiting type   [place] [facility] Marsa PARAS, DO   1 month ago PUD (peptic ulcer disease)   [place] [facility] Marsa PARAS, DO   4 months ago Functional diarrhea   [place] [facility] Angeline ORN, NP   6 months ago Bilateral edema of lower extremity   [place] [facility] Marsa PARAS, DO   7 months ago Symptomatic cholelithiasis   [place] [facility] [place], Marsa PARAS, [place]

## 2024-05-29 LAB — SURGICAL PATHOLOGY

## 2024-06-01 ENCOUNTER — Other Ambulatory Visit: Payer: Self-pay | Admitting: Family Medicine

## 2024-06-01 DIAGNOSIS — G8929 Other chronic pain: Secondary | ICD-10-CM

## 2024-06-04 NOTE — Telephone Encounter (Signed)
 Requested Prescriptions  Pending Prescriptions Disp Refills   gabapentin  (NEURONTIN ) 300 MG capsule [Pharmacy Med Name: GABAPENTIN  300 MG CAPSULE] 900 capsule 0    Sig: Take 3 caps = 900mg  dose in morning and afternoon and take 4 caps = 1200mg  in evening. Max dose is 10 capsules in 24 hours.     Neurology: Anticonvulsants - gabapentin  Passed - 06/04/2024  3:47 PM      Passed - Cr in normal range and within 360 days    Creat  Date Value Ref Range Status  11/02/2023 0.67 0.50 - 0.99 mg/dL Final   Creatinine, Ser  Date Value Ref Range Status  04/25/2024 0.67 0.44 - 1.00 mg/dL Final         Passed - Completed PHQ-2 or PHQ-9 in the last 360 days      Passed - Valid encounter within last 12 months    Recent Outpatient Visits           1 month ago Nausea and vomiting, unspecified vomiting type   [place] [facility] Ed[place] Marsa PARAS, DO   1 month ago PUD (peptic ulcer disease)   [place] [facility] Ed[place] Marsa PARAS, DO   5 months ago Functional diarrhea   [place] [facility] [place], Angeline ORN, NP   7 months ago Bilateral edema of lower extremity   [facility] Ed[place] Marsa PARAS, DO   7 months ago Symptomatic cholelithiasis   [place] [facility] [place], Marsa PARAS, [place]

## 2024-06-22 ENCOUNTER — Encounter: Payer: Self-pay | Admitting: Family Medicine

## 2024-06-22 DIAGNOSIS — M545 Low back pain, unspecified: Secondary | ICD-10-CM

## 2024-06-22 DIAGNOSIS — G8929 Other chronic pain: Secondary | ICD-10-CM

## 2024-06-25 MED ORDER — OXYCODONE-ACETAMINOPHEN 5-325 MG PO TABS: 1.0000 | ORAL_TABLET | ORAL | 0 refills | Status: DC | PRN

## 2024-06-25 NOTE — Addendum Note (Signed)
 Addended by: EDMAN MARSA PARAS on: 06/25/2024 06:21 PM   Modules accepted: Orders

## 2024-06-27 ENCOUNTER — Encounter: Payer: Self-pay | Admitting: Oncology

## 2024-06-27 ENCOUNTER — Ambulatory Visit: Payer: Self-pay

## 2024-06-27 NOTE — Telephone Encounter (Signed)
 FYI Only or Action Required?: FYI only for provider: appointment scheduled on 06/28/24 alternative provider.  Patient was last seen in primary care on 05/01/2024 by Edman Marsa PARAS, DO.  Called Nurse Triage reporting Vomiting.  Symptoms began few months getting worse .  Interventions attempted: Prescription medications: zofran  and Carafate  .  Symptoms are: gradually worsening.  Triage Disposition: See Physician Within 24 Hours  Patient/caregiver understands and will follow disposition?: Yes            Reason for Disposition  [1] MILD or MODERATE vomiting AND [2] present > 48 hours (2 days)  (Exception: Mild vomiting with associated diarrhea.)  Answer Assessment - Initial Assessment Questions Symptoms going on a long time couple of months getting more weak. No appetite. Ate cheese toast and cutie orange  a while ago this morning that stayed down has nausea . Taking zofran  for nausea daily.  Can only eat small amounts most of doesn't stay down. Last time vomited was last night . Vomited average two times per day. Able to stand and walk but feeling generalized weakness. Cut out alcohol intake. Concerned issues is liver. No yellowing of skin. No abdominal pain .Has been losing weight. Seen November for nausea  and vomiting. Taking medication carafate  and zofran  for symptoms.   Urination is baseline . Patient wanted to be seen in office, no appointments with PCP this week booked with alternative provider same office for tomorrow 2PM. Patient advised to seek ER /UC with worsening symptoms in meantime    1. VOMITING SEVERITY: How many times have you vomited in the past 24 hours?      On average 2 times a day vomiting  2. ONSET: When did the vomiting begin?      Few months ago and getting worse  3. FLUIDS: What fluids or food have you vomited up today? Have you been able to keep any fluids down?     Able to keep Gatorade down and ginger ale 4. ABDOMEN PAIN: Are your  having any abdomen pain? If Yes : How bad is it and what does it feel like? (e.g., crampy, dull, intermittent, constant)      Denies   5. DIARRHEA: Is there any diarrhea? If Yes, ask: How many times today?      Denies  7. CAUSE: What do you think is causing your vomiting?     Unknown but has had it couple of months does mention concerns for liver , no yellowing of skin  8. HYDRATION STATUS: Any signs of dehydration? (e.g., dry mouth [not only dry lips], too weak to stand) When did you last urinate?     Urination baseline  9. OTHER SYMPTOMS: Do you have any other symptoms? (e.g., fever, headache, vertigo, vomiting blood or coffee grounds, recent head injury)    Rep[orts headaches at baseline not worse. Reduced appetite and generalized weakness    Patient denies the following chest pain, shortness of breath, syncope, dizziness, fever, abdominal pain, coffee ground vomit, blood in vomit, green in vomit  Protocols used: Vomiting-A-AH Copied from CRM #[id number]. Topic: Clinical - Red Word Triage >> Jun 27, 2024 10:52 AM Winona R wrote: Nausea and vomiting can't keep anything down

## 2024-06-28 ENCOUNTER — Encounter: Payer: Self-pay | Admitting: Internal Medicine

## 2024-06-28 ENCOUNTER — Ambulatory Visit (INDEPENDENT_AMBULATORY_CARE_PROVIDER_SITE_OTHER): Admitting: Internal Medicine

## 2024-06-28 VITALS — BP 94/58 | Ht 62.0 in | Wt 105.4 lb

## 2024-06-28 DIAGNOSIS — K2980 Duodenitis without bleeding: Secondary | ICD-10-CM | POA: Diagnosis not present

## 2024-06-28 DIAGNOSIS — R531 Weakness: Secondary | ICD-10-CM | POA: Diagnosis not present

## 2024-06-28 DIAGNOSIS — K700 Alcoholic fatty liver: Secondary | ICD-10-CM

## 2024-06-28 DIAGNOSIS — K2960 Other gastritis without bleeding: Secondary | ICD-10-CM

## 2024-06-28 DIAGNOSIS — R630 Anorexia: Secondary | ICD-10-CM | POA: Diagnosis not present

## 2024-06-28 DIAGNOSIS — R5383 Other fatigue: Secondary | ICD-10-CM | POA: Diagnosis not present

## 2024-06-28 DIAGNOSIS — R634 Abnormal weight loss: Secondary | ICD-10-CM

## 2024-06-28 DIAGNOSIS — F1090 Alcohol use, unspecified, uncomplicated: Secondary | ICD-10-CM | POA: Diagnosis not present

## 2024-06-28 MED ORDER — MIRTAZAPINE 7.5 MG PO TABS: 7.5000 mg | ORAL_TABLET | Freq: Every day | ORAL | 0 refills | Status: DC

## 2024-06-28 NOTE — Progress Notes (Signed)
 "  Subjective:    Patient ID: Joanna Garcia, female    DOB: [birth year], 49 y.o.   MRN: [id number]  HPI  Discussed the use of AI scribe software for clinical note transcription with the patient, who gave verbal consent to proceed.  Ciara Kagan Aariyah Sampey is a 49 year old female with erosive gastritis and reactive duodenitis who presents with persistent nausea and vomiting.  She has experienced nausea and vomiting for over six months, with symptoms progressively worsening. She is now unable to keep food down and can only manage small portion sizes, leading to significant weakness and fatigue.  She is currently managed with pantoprazole  40 mg twice daily and Carafate  1 gram four times daily. She also takes Zofran  8 mg for nausea, but notes it does not last long.  An upper GI study on December 5th revealed erosive gastritis and reactive duodenitis. She was scheduled for a follow-up with the GI doctor on December 30th but canceled the appointment.  She has a history of alcohol use, which she stopped a week ago. No withdrawal symptoms. Her liver enzymes have been elevated, and she has undergone testing for hepatitis, celiac disease, and autoimmune diseases, all of which were negative. A CTA of the abdomen and pelvis in November showed fat buildup around the liver. An MRI of the abdomen in April 2025 was performed.  She had her gallbladder removed in the past.     Her biggest concern today is poor appetite, weight loss and fatigue.  Review of Systems   Past Medical History:  Diagnosis Date   Anemia    DVT (deep venous thrombosis) (HCC)    GERD (gastroesophageal reflux disease)    Hypertension    Polycythemia    Polyneuropathy    Stroke [facility])     Current Outpatient Medications  Medication Sig Dispense Refill   amLODipine  (NORVASC ) 5 MG tablet Take 1 tablet (5 mg total) by mouth daily. 90 tablet 0   aspirin  81 MG chewable tablet Chew and swallow 1 tablet (81 mg total) by mouth  daily. 30 tablet 2   atorvastatin  (LIPITOR ) 80 MG tablet Take 1 tablet (80 mg total) by mouth daily. 90 tablet 3   busPIRone  (BUSPAR ) 10 MG tablet Take 1 tablet (10 mg total) by mouth 2 (two) times daily as needed (anxiety insomnia). 60 tablet 2   clopidogrel  (PLAVIX ) 75 MG tablet Take 1 tablet (75 mg total) by mouth daily. 90 tablet 3   cyanocobalamin  (VITAMIN B12) 1000 MCG/ML injection Inject 1 mL (1,000 mcg total) into the muscle every 30 (thirty) days. For 4 months 4 mL 0   fluconazole  (DIFLUCAN ) 150 MG tablet Take 1 tablet (150 mg total) by mouth every other day. 7 tablet 0   furosemide  (LASIX ) 20 MG tablet Take 1 tablet (20 mg total) by mouth daily as needed. 30 tablet 0   gabapentin  (NEURONTIN ) 300 MG capsule Take 3 caps = 900mg  dose in morning and afternoon and take 4 caps = 1200mg  in evening. Max dose is 10 capsules in 24 hours. 900 capsule 0   losartan  (COZAAR ) 25 MG tablet Take 1 tablet (25 mg total) by mouth daily. 90 tablet 3   ondansetron  (ZOFRAN -ODT) 4 MG disintegrating tablet Take 1 tablet (4 mg total) by mouth every 8 (eight) hours as needed for nausea or vomiting. 30 tablet 2   oxyCODONE -acetaminophen  (PERCOCET/ROXICET) 5-325 MG tablet Take 1 tablet by mouth every 4 (four) hours as needed for severe pain (pain score 7-10).  Max 4 per day 120 tablet 0   pantoprazole  (PROTONIX ) 40 MG tablet Take 1 tablet (40 mg total) by mouth 2 (two) times daily. 60 tablet 2   polyethylene glycol (MIRALAX ) 17 g packet Take 17 g by mouth daily. 14 each 0   sucralfate  (CARAFATE ) 1 g tablet Take 1 tablet (1 g total) by mouth 4 (four) times daily -  with meals and at bedtime. As needed for abdominal pain 60 tablet 0   tiZANidine  (ZANAFLEX ) 4 MG tablet TAKE ONE TABLET EVERY 8 HOURS AS NEEDED FOR MUSCLE SPASM. 90 tablet 2   No current facility-administered medications for this visit.    Allergies[1]  Family History  Problem Relation Age of Onset   Hypertension Mother    Hyperlipidemia Mother     Heart disease Mother    COPD Mother    Hemochromatosis Mother    Hypertension Father    Hyperlipidemia Father    Cancer Father        prostate   Hemochromatosis Maternal Aunt    Hemochromatosis Maternal Aunt    Hemochromatosis Maternal Uncle    Leukemia Paternal Uncle    Cancer Paternal Grandmother    Breast cancer Paternal Grandmother     Social History   Socioeconomic History   Marital status: Divorced    Spouse name: Not on file   Number of children: Not on file   Years of education: Not on file   Highest education level: 11th grade  Occupational History   Not on file  Tobacco Use   Smoking status: Every Day    Current packs/day: 0.75    Average packs/day: 0.8 packs/day for 32.0 years (24.0 ttl pk-yrs)    Types: Cigarettes    Start date: 06/21/1992   Smokeless tobacco: Never  Vaping Use   Vaping status: Never Used  Substance and Sexual Activity   Alcohol use: Yes    Comment: 24 Oz daily   Drug use: Not Currently    Types: Marijuana   Sexual activity: Not Currently  Other Topics Concern   Not on file  Social History Narrative   Lives at home with mother and 2 dogs Charlie and Clauie    Drives   Social Drivers of Health   Tobacco Use: High Risk (05/25/2024)   Patient History    Smoking Tobacco Use: Every Day    Smokeless Tobacco Use: Never    Passive Exposure: Not on file  Financial Resource Strain: Low Risk  (05/07/2024)   Received from [facility]   Overall Financial Resource Strain (CARDIA)    Difficulty of Paying Living Expenses: Not very hard  Food Insecurity: No Food Insecurity (05/07/2024)   [facility]   Epic    Within the past 12 months, you worried that your food would run out before you got the money to buy more.: Never true    Within the past 12 months, the food you bought just didn't last and you didn't have money to get more.: Never true  Transportation Needs: No Transportation Needs  (05/07/2024)   Received from [facility] - Transportation    In the past 12 months, has lack of transportation kept you from medical appointments or from getting medications?: No    Lack of Transportation (Non-Medical): No  Physical Activity: Unknown (06/03/2023)   Exercise Vital Sign    Days of Exercise per Week: 5 days    Minutes of Exercise per  Session: Patient declined  Stress: No Stress Concern Present (06/03/2023)   [employer] of Occupational Health - Occupational Stress Questionnaire    Feeling of Stress : Not at all  Social Connections: Moderately Isolated (10/13/2023)   Social Connection and Isolation Panel    Frequency of Communication with Friends and Family: Three times a week    Frequency of Social Gatherings with Friends and Family: Three times a week    Attends Religious Services: More than 4 times per year    Active Member of Clubs or Organizations: No    Attends Banker Meetings: Never    Marital Status: Divorced  Catering Manager Violence: Not At Risk (10/13/2023)   Humiliation, Afraid, Rape, and Kick questionnaire    Fear of Current or Ex-Partner: No    Emotionally Abused: No    Physically Abused: No    Sexually Abused: No  Depression (PHQ2-9): Low Risk (05/01/2024)   Depression (PHQ2-9)    PHQ-2 Score: 0  Alcohol Screen: Low Risk (12/06/2022)   Alcohol Screen    Last Alcohol Screening Score (AUDIT): 6  Housing: Low Risk  (05/07/2024)   [facility] from [facility]   Epic    In the last 12 months, was there a time when you were not able to pay the mortgage or rent on time?: No    In the past 12 months, how many times have you moved where you were living?: 0    At any time in the past 12 months, were you homeless or living in a shelter (including now)?: No  Utilities: Not At Risk (05/07/2024)   Received from [facility]   Epic    In the past 12 months has the electric, gas, oil, or  water company threatened to shut off services in your home?: No  Health Literacy: Not on file     Constitutional: Patient reports unintentional weight loss, fatigue.  Denies fever, malaise, headache.  Respiratory: Denies difficulty breathing, shortness of breath, cough or sputum production.   Cardiovascular: Denies chest pain, chest tightness, palpitations or swelling in the hands or feet.  Gastrointestinal: Patient reports poor appetite, nausea and vomiting.  Denies abdominal pain, bloating, constipation, diarrhea or blood in the stool.  GU: Denies urgency, frequency, pain with urination, burning sensation, blood in urine, odor or discharge. Musculoskeletal: Patient reports generalized weakness.  Denies decrease in range of motion, difficulty with gait, muscle pain or joint pain and swelling.  Skin: Denies redness, rashes, lesions or ulcercations.    No other specific complaints in a complete review of systems (except as listed in HPI above).      Objective:   Physical Exam BP (!) 94/58 (BP Location: Left Arm, Patient Position: Sitting, Cuff Size: Normal)   Ht 5' 2 (1.575 m)   Wt 105 lb 6.4 oz (47.8 kg)   LMP 12/20/2022   BMI 19.28 kg/m   Wt Readings from Last 3 Encounters:  05/25/24 110 lb 12.8 oz (50.3 kg)  05/01/24 108 lb 2 oz (49 kg)  04/25/24 106 lb (48.1 kg)    General: Appears older than her stated age, chronically ill-appearing, in NAD. Skin: Warm, dry and intact.  Slight yellow discoloration of the skin. HEENT: Head: normal shape and size; Eyes: sclera white, no icterus, conjunctiva pink, PERRLA and EOMs intact; Cardiovascular: Tachycardic with normal rhythm. S1,S2 noted.  No murmur, rubs or gallops noted.  Pulmonary/Chest: Normal effort and positive vesicular breath sounds.  Abdomen: Soft and nontender.  Normal bowel sounds. No distention or masses noted.  Musculoskeletal:  No difficulty with gait.  Neurological: Alert and oriented.   BMET    Component Value  Date/Time   NA 137 04/25/2024 1102   K 2.8 (L) 04/25/2024 1102   CL 95 (L) 04/25/2024 1102   CO2 27 04/25/2024 1102   GLUCOSE 118 (H) 04/25/2024 1102   BUN 9 04/25/2024 1102   CREATININE 0.67 04/25/2024 1102   CREATININE 0.67 11/02/2023 0857   CALCIUM  7.9 (L) 04/25/2024 1102   GFRNONAA >60 04/25/2024 1102   GFRAA >60 06/05/2019 1710    Lipid Panel     Component Value Date/Time   CHOL 169 09/23/2023 0759   TRIG 94 09/23/2023 0759   HDL 97 09/23/2023 0759   CHOLHDL 1.7 09/23/2023 0759   VLDL 41 (H) 01/22/2023 1730   LDLCALC 54 09/23/2023 0759    CBC    Component Value Date/Time   WBC 5.7 04/25/2024 1102   RBC 3.56 (L) 04/25/2024 1102   HGB 14.1 04/25/2024 1102   HGB 15.2 (H) 11/22/2022 0913   HCT 39.9 04/25/2024 1102   PLT 312 04/25/2024 1102   PLT 208 11/22/2022 0913   MCV 112.1 (H) 04/25/2024 1102   MCH 39.6 (H) 04/25/2024 1102   MCHC 35.3 04/25/2024 1102   RDW 14.3 04/25/2024 1102   LYMPHSABS 0.7 10/14/2023 0449   MONOABS 0.3 10/14/2023 0449   EOSABS 0.0 10/14/2023 0449   BASOSABS 0.0 10/14/2023 0449    Hgb A1C Lab Results  Component Value Date   HGBA1C 5.7 (H) 12/22/2022            Assessment & Plan:  Assessment and Plan    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 Current medications partially effective. - Continue pantoprazole  40 mg twice daily. - Continue Carafate  1 gram four times daily. - Continue Zofran  8 mg as needed for nausea. - Consider promethazine  if Zofran  is ineffective.  Fatty liver disease and alcohol use disorder Fatty liver with recent alcohol use, currently sober. Elevated liver enzymes, no cirrhosis on imaging. GGT elevated. - Discuss liver biopsy with GI specialist if concerns persist. - Continue follow-up with GI specialist for liver management.  Generalized weakness, fatigue and poor appetite Suspected dehydration due to weakness and  low blood pressure. - Discussed obtaining CBC and comprehensive metabolic panel today however she would like to hold off at this time - Advised her to push fluids - Consider hospital admission for IV fluids if confirmed.  -Will start her on mirtazapine  7.5 mg daily for appetite stimulation to induce weight gain.  Advised her she will need dose adjustment in 1 month and should follow-up with her PCP.       Follow-up with your PCP as previously scheduled Angeline Laura, NP     [1] No Known Allergies  "

## 2024-06-28 NOTE — Patient Instructions (Signed)
 Mirtazapine  Tablets What is this medication? MIRTAZAPINE  (mir TAZ a peen) treats depression. It increases the amount of serotonin and norepinephrine in the brain, substances that help regulate mood. This medicine may be used for other purposes; ask your health care provider or pharmacist if you have questions. COMMON BRAND NAME(S): Remeron  What should I tell my care team before I take this medication? They need to know if you have any of these conditions: Bipolar disorder Dehydration Glaucoma Have had a stroke Heart or blood vessel conditions Kidney disease Liver disease Low blood pressure Low levels of sodium in the blood Low white blood cell levels Seizures Suicidal thoughts, plans, or attempt An unusual or allergic reaction to mirtazapine , other medications, foods, dyes, or preservatives Pregnant or trying to get pregnant Breastfeeding How should I use this medication? Take this medication by mouth with water. Take it as directed on the prescription label at the same time every day. You can take it with or without food. If it upsets your stomach, take it with food. Keep taking this medication unless your care team tells you to stop. Stopping it too quickly can cause serious side effects. It can also make your condition worse. A special MedGuide will be given to you by the pharmacist with each prescription and refill. Be sure to read this information carefully each time. Talk to your care team about the use of this medication in children. Special care may be needed. Overdosage: If you think you have taken too much of this medicine contact a poison control center or emergency room at once. NOTE: This medicine is only for you. Do not share this medicine with others. What if I miss a dose? If you miss a dose, take it as soon as you can. If it is almost time for your next dose, take only that dose. Do not take double or extra doses. What may interact with this medication? Do not take  this medication with any of the following: Cisapride Dronedarone Levoketoconazole Linezolid MAOIs, such as Marplan, Nardil, and Parnate Methylene blue Pimozide Some medications for fungal infections, such as ketoconazole, posaconazole, voriconazole Thioridazine This medication may also interact with the following: Alcohol Benzodiazepines, such as lorazepam Cimetidine Clarithromycin Other medications that cause heart rhythm changes Opioids Rifampin Some medications for depression, anxiety, or other mental health conditions Some medication for migraines, such as sumatriptan Some medications for seizures, such as carbamazepine or phenytoin Stimulant medications for ADHD, weight loss, or staying awake Supplements, such as St. John's wort or tryptophan Warfarin Other medications may affect the way this medication works. Talk with your care team about all the medications you take. They may suggest changes to your treatment plan to lower the risk of side effects and to make sure your medications work as intended. This list may not describe all possible interactions. Give your health care provider a list of all the medicines, herbs, non-prescription drugs, or dietary supplements you use. Also tell them if you smoke, drink alcohol, or use illegal drugs. Some items may interact with your medicine. What should I watch for while using this medication? Visit your care team for regular checks on your progress. It may be some time before you see the benefit from this medication. This medication may worsen depression and cause thoughts of suicide. This can happen at any time but is more common after first starting treatment and after a change in dose. Talk to your care team right away if you have changes in mood and behavior or  thoughts of self-harm or suicide. They can help you. This medication may affect your coordination, reaction time, or judgment. Do not drive or operate machinery until you know  how this medication affects you. Sit up or stand slowly to reduce the risk of dizzy or fainting spells. Drinking alcohol with this medication can increase the risk of these side effects. Your mouth may get dry. Chewing sugarless gum or sucking hard candy and drinking plenty of water may help. Contact your care team if the problem does not go away or is severe. What side effects may I notice from receiving this medication? Side effects that you should report to your care team as soon as possible: Allergic reactions--skin rash, itching, hives, swelling of the face, lips, tongue, or throat Heart rhythm changes--fast or irregular heartbeat, dizziness, feeling faint or lightheaded, chest pain, trouble breathing Infection--fever, chills, cough, or sore throat Irritability, confusion, fast or irregular heartbeat, muscle stiffness, twitching muscles, sweating, high fever, seizure, chills, vomiting, diarrhea, which may be signs of serotonin syndrome Low sodium level--muscle weakness, fatigue, dizziness, headache, confusion Rash, fever, and swollen lymph nodes Redness, blistering, peeling, or loosening of the skin, including inside the mouth Seizures Sudden eye pain or change in vision such as blurry vision, seeing halos around lights, vision loss Thoughts of suicide or self-harm, worsening mood, feelings of depression Side effects that usually do not require medical attention (report these to your care team if they continue or are bothersome): Constipation Dizziness Drowsiness Dry mouth Increase in appetite Weight gain This list may not describe all possible side effects. Call your doctor for medical advice about side effects. You may report side effects to FDA at 1-800-FDA-1088. Where should I keep my medication? Keep out of the reach of children and pets. Store at room temperature between 20 and 25 degrees C (68 and 77 degrees F). Protect from light and moisture. Keep the container tightly closed.  Get rid of any unused medication after the expiration date. To get rid of medications that are no longer needed or have expired: Take the medication to a take-back program. Check with your pharmacy or law enforcement to find a location. If you cannot return the medication, check the label or package insert to see if the medication should be thrown out in the garbage or flushed down the toilet. If you are not sure, ask your care team. If it is safe to put it in the trash, empty the medication out of the container. Mix it with cat litter, dirt, coffee grounds, or another unwanted substance. Seal the mixture in a bag or container. Put it in the trash. NOTE: This sheet is a summary. It may not cover all possible information. If you have questions about this medicine, talk to your doctor, pharmacist, or health care provider.  2025 [employer] Standard (2023-10-05 00:00:00)

## 2024-07-23 ENCOUNTER — Encounter: Payer: Self-pay | Admitting: Family Medicine

## 2024-07-23 DIAGNOSIS — G8929 Other chronic pain: Secondary | ICD-10-CM

## 2024-07-23 DIAGNOSIS — M545 Low back pain, unspecified: Secondary | ICD-10-CM

## 2024-07-24 ENCOUNTER — Other Ambulatory Visit: Payer: Self-pay | Admitting: Family Medicine

## 2024-07-24 ENCOUNTER — Encounter: Payer: Self-pay | Admitting: Family Medicine

## 2024-07-24 ENCOUNTER — Other Ambulatory Visit: Payer: Self-pay | Admitting: Internal Medicine

## 2024-07-24 DIAGNOSIS — G8929 Other chronic pain: Secondary | ICD-10-CM

## 2024-07-24 DIAGNOSIS — F5104 Psychophysiologic insomnia: Secondary | ICD-10-CM

## 2024-07-24 DIAGNOSIS — M545 Low back pain, unspecified: Secondary | ICD-10-CM

## 2024-07-24 MED ORDER — OXYCODONE-ACETAMINOPHEN 5-325 MG PO TABS: 1.0000 | ORAL_TABLET | ORAL | 0 refills | Status: AC | PRN

## 2024-07-24 NOTE — Addendum Note (Signed)
 Addended by: EDMAN MARSA PARAS on: 07/24/2024 12:40 PM   Modules accepted: Orders

## 2024-07-24 NOTE — Telephone Encounter (Signed)
 Copied from CRM [id number]. Topic: Clinical - Prescription Issue >> Jul 24, 2024  1:42 PM Antony RAMAN wrote: Reason for CRM: Leita from [facility] calling to say they have not received the rx for oxyCODONE -acetaminophen  (PERCOCET/ROXICET) 5-325 MG tablet  [id number]

## 2024-07-24 NOTE — Addendum Note (Signed)
 Addended by: ZELIA GAUZE D on: 07/24/2024 10:38 AM   Modules accepted: Orders
